# Patient Record
Sex: Female | Born: 1935 | Race: Black or African American | Hispanic: No | Smoking: Never smoker
Health system: Southern US, Community
[De-identification: ages and names within clinical notes are randomized; demographics above are authoritative.]

## PROBLEM LIST (undated history)

## (undated) DIAGNOSIS — E079 Disorder of thyroid, unspecified: Secondary | ICD-10-CM

## (undated) DIAGNOSIS — R001 Bradycardia, unspecified: Secondary | ICD-10-CM

## (undated) DIAGNOSIS — J189 Pneumonia, unspecified organism: Secondary | ICD-10-CM

## (undated) DIAGNOSIS — M1990 Unspecified osteoarthritis, unspecified site: Secondary | ICD-10-CM

## (undated) DIAGNOSIS — K859 Acute pancreatitis without necrosis or infection, unspecified: Secondary | ICD-10-CM

## (undated) DIAGNOSIS — N390 Urinary tract infection, site not specified: Secondary | ICD-10-CM

## (undated) DIAGNOSIS — D649 Anemia, unspecified: Secondary | ICD-10-CM

## (undated) DIAGNOSIS — K219 Gastro-esophageal reflux disease without esophagitis: Secondary | ICD-10-CM

## (undated) DIAGNOSIS — K31819 Angiodysplasia of stomach and duodenum without bleeding: Secondary | ICD-10-CM

## (undated) DIAGNOSIS — E876 Hypokalemia: Secondary | ICD-10-CM

## (undated) DIAGNOSIS — I10 Essential (primary) hypertension: Secondary | ICD-10-CM

## (undated) DIAGNOSIS — C801 Malignant (primary) neoplasm, unspecified: Secondary | ICD-10-CM

## (undated) HISTORY — DX: Urinary tract infection, site not specified: N39.0

## (undated) HISTORY — PX: BREAST SURGERY: SHX581

---

## 2008-10-21 ENCOUNTER — Encounter: Admission: RE | Admit: 2008-10-21 | Discharge: 2009-01-09 | Payer: Self-pay | Admitting: Unknown Physician Specialty

## 2012-10-28 ENCOUNTER — Emergency Department (HOSPITAL_COMMUNITY)
Admission: EM | Admit: 2012-10-28 | Discharge: 2012-10-28 | Disposition: A | Discharge status: Home / Self Care | Payer: Medicare Other | Attending: Emergency Medicine | Admitting: Emergency Medicine

## 2012-10-28 ENCOUNTER — Encounter (HOSPITAL_COMMUNITY): Payer: Self-pay | Admitting: *Deleted

## 2012-10-28 ENCOUNTER — Emergency Department (HOSPITAL_COMMUNITY): Payer: Medicare Other

## 2012-10-28 DIAGNOSIS — Z862 Personal history of diseases of the blood and blood-forming organs and certain disorders involving the immune mechanism: Secondary | ICD-10-CM | POA: Insufficient documentation

## 2012-10-28 DIAGNOSIS — H5789 Other specified disorders of eye and adnexa: Secondary | ICD-10-CM | POA: Insufficient documentation

## 2012-10-28 DIAGNOSIS — W1809XA Striking against other object with subsequent fall, initial encounter: Secondary | ICD-10-CM | POA: Insufficient documentation

## 2012-10-28 DIAGNOSIS — Y9289 Other specified places as the place of occurrence of the external cause: Secondary | ICD-10-CM | POA: Insufficient documentation

## 2012-10-28 DIAGNOSIS — S0990XA Unspecified injury of head, initial encounter: Secondary | ICD-10-CM | POA: Insufficient documentation

## 2012-10-28 DIAGNOSIS — Z853 Personal history of malignant neoplasm of breast: Secondary | ICD-10-CM | POA: Insufficient documentation

## 2012-10-28 DIAGNOSIS — Y9389 Activity, other specified: Secondary | ICD-10-CM | POA: Insufficient documentation

## 2012-10-28 DIAGNOSIS — Z8739 Personal history of other diseases of the musculoskeletal system and connective tissue: Secondary | ICD-10-CM | POA: Insufficient documentation

## 2012-10-28 DIAGNOSIS — W19XXXA Unspecified fall, initial encounter: Secondary | ICD-10-CM

## 2012-10-28 DIAGNOSIS — Z8639 Personal history of other endocrine, nutritional and metabolic disease: Secondary | ICD-10-CM | POA: Insufficient documentation

## 2012-10-28 HISTORY — DX: Malignant (primary) neoplasm, unspecified: C80.1

## 2012-10-28 HISTORY — DX: Disorder of thyroid, unspecified: E07.9

## 2012-10-28 HISTORY — DX: Unspecified osteoarthritis, unspecified site: M19.90

## 2012-10-28 NOTE — ED Notes (Signed)
Dr. Adriana Simas aware of elevated BP.  At bedside to discuss plan and discharge with pt and family.

## 2012-10-28 NOTE — ED Notes (Signed)
Pt reports falling on kitchen floor.  States that when she did her glasses struck her face on the right eye.  Swelling and bruising noted around eye.  Pt denies striking head or LOC.  Denies pain in any other location.

## 2012-10-28 NOTE — ED Notes (Signed)
Pt states she had a "hard" fall and hit the right side of her body. Pt has swelling to right eye but states she is in no pain. Denies loc.

## 2012-10-28 NOTE — ED Provider Notes (Signed)
History    This chart was scribed for Jo Hutching, MD by Leone Payor, ED Scribe. This patient was seen in room [facility]04/[facility]04 and the patient's care was started 9:24 PM.   CSN: [id number]  Arrival date & time 10/28/12  2114   First MD Initiated Contact with Patient 10/28/12 2123      Chief Complaint  Patient presents with  . Fall     The history is provided by the patient and a relative. No language interpreter was used.    Oddie Hanssen is a 79 y.o. female who presents to the Emergency Department complaining of a new fall tonight. Pt stays with daughter and daughter states this is her second fall recently. Pt states when she fell, she hit the right side of her body but she is not in any pain. Pt has swelling to right eye but denies LOC. Pt insists she is not sore anywhere on her body. Daughter states pt's behavior is unchanged post fall. Daughter states applying ice to the right eye.   Pt has h/o arthritis.  Pt denies smoking and alcohol use.  Past Medical History  Diagnosis Date  . Arthritis   . Thyroid disease   . Cancer     had breast cancer 8 yrs ago    Past Surgical History  Procedure Laterality Date  . Breast surgery      History reviewed. No pertinent family history.  History  Substance Use Topics  . Smoking status: Never Smoker   . Smokeless tobacco: Not on file  . Alcohol Use: No    No OB history provided.   Review of Systems A complete 10 system review of systems was obtained and all systems are negative except as noted in the HPI and PMH.   Allergies  Review of patient's allergies indicates not on file.  Home Medications  No current outpatient prescriptions on file.  BP 200/89  Pulse 89  Temp(Src) 98.3 F (36.8 C) (Oral)  Resp 20  Ht 5\' 2"  (1.575 m)  Wt 134 lb (60.782 kg)  BMI 24.5 kg/m2  SpO2 97%  Physical Exam  Nursing note and vitals reviewed. Constitutional: She is oriented to person, place, and time. She appears well-developed and  well-nourished.  HENT:  Head: Normocephalic and atraumatic.  Eyes: Conjunctivae and EOM are normal. Pupils are equal, round, and reactive to light.  Neck: Normal range of motion. Neck supple.  Cardiovascular: Normal rate, regular rhythm and normal heart sounds.   Pulmonary/Chest: Effort normal and breath sounds normal.  Abdominal: Soft. Bowel sounds are normal.  Musculoskeletal: Normal range of motion.  Neurological: She is alert and oriented to person, place, and time.  Skin: Skin is warm and dry.  2.5 cm hematoma to right upper eyelid.   Psychiatric: She has a normal mood and affect.    ED Course  Procedures (including critical care time)  DIAGNOSTIC STUDIES: Oxygen Saturation is 97% on room air, adequate by my interpretation.    COORDINATION OF CARE: 9:49 PM Discussed treatment plan which includes CT scan of head with pt at bedside and pt agreed to plan.    Labs Reviewed - No data to display No results found.  Ct Head Wo Contrast  10/28/2012  *RADIOLOGY REPORT*  Clinical Data: 79 year old female status post fall with right eye swelling.  Right side injury.  CT HEAD WITHOUT CONTRAST  Technique:  Contiguous axial images were obtained from the base of the skull through the vertex without contrast.  Comparison: None.  Findings: Right periorbital hematoma inseparable from the right lacrimal gland and measuring up to 10 mm in thickness.  Right globe appears intact.  Retrobulbar soft tissues appear symmetric and within normal limits.  No visible right orbital fracture. Osteopenia.  Scalp soft tissues elsewhere within normal limits.  Mucous retention cyst right sphenoid sinus.  Trace layering low density fluid in the right sphenoid sinus.  Other Visualized paranasal sinuses and mastoids are clear.  Congenital nonfusion posterior C1 ring. No acute osseous abnormality identified.  Chronic lacunar infarcts in the cerebellum.  Cerebral volume at the lower limits of normal for age.  No  ventriculomegaly. No midline shift, mass effect, or evidence of mass lesion.  Mild for age periventricular white matter hypodensity. No evidence of cortically based acute infarction identified.  No acute intracranial hemorrhage identified.  No suspicious intracranial vascular hyperdensity.  IMPRESSION: 1.  Right periorbital soft tissue injury without underlying fracture identified. 2.  Chronic small vessel disease.  No acute traumatic injury to the brain identified. 3.  Trace low density fluid level in the right sphenoid sinus, favor inflammatory.   Original Report Authenticated By: Erskine Speed, M.D.    No diagnosis found.    MDM  CT head shows no subdural hematoma or fractures. Family says normal behavior     I personally performed the services described in this documentation, which was scribed in my presence. The recorded information has been reviewed and is accurate.   Jo Hutching, MD 10/28/12 2248

## 2012-10-28 NOTE — ED Notes (Signed)
Patient transported to CT 

## 2015-05-03 ENCOUNTER — Emergency Department (HOSPITAL_COMMUNITY): Payer: Medicare Other

## 2015-05-03 ENCOUNTER — Encounter (HOSPITAL_COMMUNITY): Payer: Self-pay

## 2015-05-03 ENCOUNTER — Inpatient Hospital Stay (HOSPITAL_COMMUNITY)
Admission: EM | Admit: 2015-05-03 | Discharge: 2015-05-09 | DRG: 440 | Disposition: A | Discharge status: Skilled Nursing Facility | Payer: Medicare Other | Attending: Internal Medicine | Admitting: Internal Medicine

## 2015-05-03 DIAGNOSIS — E039 Hypothyroidism, unspecified: Secondary | ICD-10-CM | POA: Diagnosis present

## 2015-05-03 DIAGNOSIS — E876 Hypokalemia: Secondary | ICD-10-CM | POA: Diagnosis not present

## 2015-05-03 DIAGNOSIS — K219 Gastro-esophageal reflux disease without esophagitis: Secondary | ICD-10-CM | POA: Diagnosis present

## 2015-05-03 DIAGNOSIS — M1990 Unspecified osteoarthritis, unspecified site: Secondary | ICD-10-CM | POA: Diagnosis present

## 2015-05-03 DIAGNOSIS — R7989 Other specified abnormal findings of blood chemistry: Secondary | ICD-10-CM | POA: Diagnosis present

## 2015-05-03 DIAGNOSIS — Z791 Long term (current) use of non-steroidal anti-inflammatories (NSAID): Secondary | ICD-10-CM

## 2015-05-03 DIAGNOSIS — Z853 Personal history of malignant neoplasm of breast: Secondary | ICD-10-CM | POA: Diagnosis not present

## 2015-05-03 DIAGNOSIS — Z6824 Body mass index (BMI) 24.0-24.9, adult: Secondary | ICD-10-CM

## 2015-05-03 DIAGNOSIS — R112 Nausea with vomiting, unspecified: Secondary | ICD-10-CM | POA: Diagnosis not present

## 2015-05-03 DIAGNOSIS — K31819 Angiodysplasia of stomach and duodenum without bleeding: Secondary | ICD-10-CM

## 2015-05-03 DIAGNOSIS — Z79899 Other long term (current) drug therapy: Secondary | ICD-10-CM | POA: Diagnosis not present

## 2015-05-03 DIAGNOSIS — D649 Anemia, unspecified: Secondary | ICD-10-CM | POA: Diagnosis not present

## 2015-05-03 DIAGNOSIS — D500 Iron deficiency anemia secondary to blood loss (chronic): Secondary | ICD-10-CM | POA: Diagnosis not present

## 2015-05-03 DIAGNOSIS — E785 Hyperlipidemia, unspecified: Secondary | ICD-10-CM | POA: Diagnosis present

## 2015-05-03 DIAGNOSIS — M479 Spondylosis, unspecified: Secondary | ICD-10-CM | POA: Diagnosis present

## 2015-05-03 DIAGNOSIS — K850 Idiopathic acute pancreatitis: Secondary | ICD-10-CM | POA: Diagnosis not present

## 2015-05-03 DIAGNOSIS — K859 Acute pancreatitis without necrosis or infection, unspecified: Secondary | ICD-10-CM | POA: Diagnosis present

## 2015-05-03 DIAGNOSIS — D509 Iron deficiency anemia, unspecified: Secondary | ICD-10-CM | POA: Diagnosis present

## 2015-05-03 DIAGNOSIS — I10 Essential (primary) hypertension: Secondary | ICD-10-CM | POA: Diagnosis present

## 2015-05-03 HISTORY — DX: Acute pancreatitis without necrosis or infection, unspecified: K85.90

## 2015-05-03 HISTORY — DX: Angiodysplasia of stomach and duodenum without bleeding: K31.819

## 2015-05-03 LAB — COMPREHENSIVE METABOLIC PANEL
ALK PHOS: 95 U/L (ref 38–126)
ALT: 67 U/L — AB (ref 14–54)
AST: 112 U/L — ABNORMAL HIGH (ref 15–41)
Albumin: 3.7 g/dL (ref 3.5–5.0)
Anion gap: 11 (ref 5–15)
BUN: 19 mg/dL (ref 6–20)
CALCIUM: 8.8 mg/dL — AB (ref 8.9–10.3)
CHLORIDE: 104 mmol/L (ref 101–111)
CO2: 23 mmol/L (ref 22–32)
CREATININE: 1.19 mg/dL — AB (ref 0.44–1.00)
GFR, EST AFRICAN AMERICAN: 45 mL/min — AB (ref >60)
GFR, EST NON AFRICAN AMERICAN: 39 mL/min — AB (ref >60)
Glucose, Bld: 105 mg/dL — ABNORMAL HIGH (ref 65–99)
Potassium: 4 mmol/L (ref 3.5–5.1)
Sodium: 138 mmol/L (ref 135–145)
Total Bilirubin: 1 mg/dL (ref 0.3–1.2)
Total Protein: 7.1 g/dL (ref 6.5–8.1)

## 2015-05-03 LAB — URINALYSIS, ROUTINE W REFLEX MICROSCOPIC
BILIRUBIN URINE: NEGATIVE
Glucose, UA: NEGATIVE mg/dL
KETONES UR: NEGATIVE mg/dL
Leukocytes, UA: NEGATIVE
NITRITE: NEGATIVE
Protein, ur: NEGATIVE mg/dL
Specific Gravity, Urine: 1.01 (ref 1.005–1.030)
UROBILINOGEN UA: 0.2 mg/dL (ref 0.0–1.0)
pH: 5.5 (ref 5.0–8.0)

## 2015-05-03 LAB — CBC WITH DIFFERENTIAL/PLATELET
BASOS ABS: 0 10*3/uL (ref 0.0–0.1)
Basophils Relative: 0 % (ref 0–1)
Eosinophils Absolute: 0 10*3/uL (ref 0.0–0.7)
Eosinophils Relative: 0 % (ref 0–5)
HCT: 35.7 % — ABNORMAL LOW (ref 36.0–46.0)
Hemoglobin: 11.7 g/dL — ABNORMAL LOW (ref 12.0–15.0)
LYMPHS ABS: 0.5 10*3/uL — AB (ref 0.7–4.0)
Lymphocytes Relative: 4 % — ABNORMAL LOW (ref 12–46)
MCH: 28.4 pg (ref 26.0–34.0)
MCHC: 32.8 g/dL (ref 30.0–36.0)
MCV: 86.7 fL (ref 78.0–100.0)
MONO ABS: 0.6 10*3/uL (ref 0.1–1.0)
Monocytes Relative: 5 % (ref 3–12)
Neutro Abs: 11 10*3/uL — ABNORMAL HIGH (ref 1.7–7.7)
Neutrophils Relative %: 91 % — ABNORMAL HIGH (ref 43–77)
PLATELETS: 223 10*3/uL (ref 150–400)
RBC: 4.12 MIL/uL (ref 3.87–5.11)
RDW: 14.5 % (ref 11.5–15.5)
WBC Morphology: INCREASED
WBC: 12.1 10*3/uL — AB (ref 4.0–10.5)

## 2015-05-03 LAB — LIPASE, BLOOD
LIPASE: 1757 U/L — AB (ref 22–51)
Lipase: 1645 U/L — ABNORMAL HIGH (ref 22–51)

## 2015-05-03 LAB — URINE MICROSCOPIC-ADD ON

## 2015-05-03 MED ORDER — ACETAMINOPHEN 325 MG PO TABS
650.0000 mg | ORAL_TABLET | Freq: Four times a day (QID) | ORAL | Status: DC | PRN
  Administered 2015-05-04: 650 mg via ORAL
  Filled 2015-05-03: qty 2

## 2015-05-03 MED ORDER — LEVOTHYROXINE SODIUM 25 MCG PO TABS
125.0000 ug | ORAL_TABLET | Freq: Every day | ORAL | Status: DC
  Administered 2015-05-04 – 2015-05-09 (×6): 125 ug via ORAL
  Filled 2015-05-03 (×12): qty 1

## 2015-05-03 MED ORDER — INFLUENZA VAC SPLIT QUAD 0.5 ML IM SUSY
0.5000 mL | PREFILLED_SYRINGE | INTRAMUSCULAR | Status: AC
  Administered 2015-05-04: 0.5 mL via INTRAMUSCULAR
  Filled 2015-05-03: qty 0.5

## 2015-05-03 MED ORDER — TRAMADOL HCL 50 MG PO TABS
50.0000 mg | ORAL_TABLET | Freq: Two times a day (BID) | ORAL | Status: DC
  Administered 2015-05-03 – 2015-05-09 (×12): 50 mg via ORAL
  Filled 2015-05-03 (×12): qty 1

## 2015-05-03 MED ORDER — LOSARTAN POTASSIUM 50 MG PO TABS
100.0000 mg | ORAL_TABLET | Freq: Every day | ORAL | Status: DC
  Administered 2015-05-04 – 2015-05-09 (×6): 100 mg via ORAL
  Filled 2015-05-03 (×6): qty 2

## 2015-05-03 MED ORDER — ONDANSETRON HCL 4 MG PO TABS
4.0000 mg | ORAL_TABLET | Freq: Four times a day (QID) | ORAL | Status: DC | PRN
  Administered 2015-05-06: 4 mg via ORAL
  Filled 2015-05-03: qty 1

## 2015-05-03 MED ORDER — ALUM & MAG HYDROXIDE-SIMETH 200-200-20 MG/5ML PO SUSP: 30.0000 mL | Freq: Four times a day (QID) | ORAL | Status: DC | PRN

## 2015-05-03 MED ORDER — IOHEXOL 300 MG/ML  SOLN
25.0000 mL | Freq: Once | INTRAMUSCULAR | Status: AC | PRN
  Administered 2015-05-03: 25 mL via ORAL

## 2015-05-03 MED ORDER — SODIUM CHLORIDE 0.9 % IV SOLN
INTRAVENOUS | Status: DC
Start: 2015-05-03 — End: 2015-05-08
  Administered 2015-05-03 – 2015-05-08 (×12): via INTRAVENOUS

## 2015-05-03 MED ORDER — SODIUM CHLORIDE 0.9 % IV BOLUS (SEPSIS)
500.0000 mL | Freq: Once | INTRAVENOUS | Status: AC
  Administered 2015-05-03: 500 mL via INTRAVENOUS

## 2015-05-03 MED ORDER — ONDANSETRON HCL 4 MG/2ML IJ SOLN: 4.0000 mg | Freq: Four times a day (QID) | INTRAMUSCULAR | Status: DC | PRN

## 2015-05-03 MED ORDER — ONDANSETRON HCL 4 MG/2ML IJ SOLN
4.0000 mg | Freq: Once | INTRAMUSCULAR | Status: AC
  Administered 2015-05-03: 4 mg via INTRAVENOUS
  Filled 2015-05-03: qty 2

## 2015-05-03 MED ORDER — FLUTICASONE PROPIONATE 50 MCG/ACT NA SUSP
1.0000 | Freq: Two times a day (BID) | NASAL | Status: DC
  Administered 2015-05-03 – 2015-05-09 (×12): 1 via NASAL
  Filled 2015-05-03 (×2): qty 16

## 2015-05-03 MED ORDER — SIMVASTATIN 10 MG PO TABS
10.0000 mg | ORAL_TABLET | Freq: Every day | ORAL | Status: DC
  Administered 2015-05-03 – 2015-05-08 (×6): 10 mg via ORAL
  Filled 2015-05-03 (×6): qty 1

## 2015-05-03 MED ORDER — DOCUSATE SODIUM 100 MG PO CAPS
100.0000 mg | ORAL_CAPSULE | Freq: Every morning | ORAL | Status: DC
  Administered 2015-05-04 – 2015-05-09 (×6): 100 mg via ORAL
  Filled 2015-05-03 (×6): qty 1

## 2015-05-03 MED ORDER — ACETAMINOPHEN 650 MG RE SUPP: 650.0000 mg | Freq: Four times a day (QID) | RECTAL | Status: DC | PRN

## 2015-05-03 MED ORDER — ENOXAPARIN SODIUM 30 MG/0.3ML ~~LOC~~ SOLN
30.0000 mg | SUBCUTANEOUS | Status: DC
Start: 2015-05-03 — End: 2015-05-04
  Administered 2015-05-03: 30 mg via SUBCUTANEOUS
  Filled 2015-05-03: qty 0.3

## 2015-05-03 MED ORDER — IOHEXOL 300 MG/ML  SOLN
80.0000 mL | Freq: Once | INTRAMUSCULAR | Status: AC | PRN
Start: 2015-05-03 — End: 2015-05-03
  Administered 2015-05-03: 80 mL via INTRAVENOUS

## 2015-05-03 MED ORDER — SENNOSIDES-DOCUSATE SODIUM 8.6-50 MG PO TABS: 1.0000 | ORAL_TABLET | Freq: Every evening | ORAL | Status: DC | PRN

## 2015-05-03 MED ORDER — PANTOPRAZOLE SODIUM 40 MG PO TBEC
40.0000 mg | DELAYED_RELEASE_TABLET | Freq: Every day | ORAL | Status: DC
  Administered 2015-05-04 – 2015-05-09 (×6): 40 mg via ORAL
  Filled 2015-05-03 (×6): qty 1

## 2015-05-03 NOTE — ED Notes (Signed)
zofran given, instructed pt to start drinking contrast around 3 if she can tolerate it.

## 2015-05-03 NOTE — H&P (Addendum)
History and Physical  Jo Hardin WLN:[id number] DOB: 06-Mar-1922 DOA: 05/03/2015  Referring physician: Dr. Alvino Chapel, ED physician PCP: No primary care provider on file.   Chief Complaint: Abdominal pain, nausea, vomiting  HPI: Jo Hardin is a 81 y.o. female  With a history of arthritis, thyroid disease, breast cancer status post lumpectomy, essential hypertension, hyperlipidemia. Patient presents with 48 hours of abdominal pain primarily in the epigastric region. Her pain has been worsening over the course of the 48 hours. Pain is worse with food and liquid. Slightly improved with vomiting. She has been vomiting several times over the 48 hours which is described as somewhat contents. Vomiting worse with oral food and liquid.   Review of Systems:   Pt complains of an episode of diarrhea.  Pt denies any fevers, chills, constipation, shortness of breath, dyspnea on exertion, orthopnea, cough, wheezing, palpitations, headache, vision changes, lightheadedness, dizziness, diarrhea, constipation, melena, rectal bleeding.  Review of systems are otherwise negative  Past Medical History  Diagnosis Date  . Arthritis   . Thyroid disease   . Cancer     had breast cancer 8 yrs ago   Past Surgical History  Procedure Laterality Date  . Breast surgery     Social History:  reports that she has never smoked. She does not have any smokeless tobacco history on file. She reports that she does not drink alcohol or use illicit drugs. Patient lives at home with her daughter & is able to participate in activities of daily living  Allergies  Allergen Reactions  . Codeine Nausea And Vomiting    Family history of hypertension  Prior to Admission medications   Medication Sig Start Date End Date Taking? Authorizing Provider  docusate sodium (COLACE) 100 MG capsule Take 100 mg by mouth every morning.   Yes Historical Provider, MD  fluticasone (FLONASE) 50 MCG/ACT nasal spray Place 1 spray into the nose 2  (two) times daily.   Yes Historical Provider, MD  levothyroxine (SYNTHROID, LEVOTHROID) 125 MCG tablet Take 125 mcg by mouth daily.   Yes Historical Provider, MD  losartan (COZAAR) 100 MG tablet Take 100 mg by mouth daily.   Yes Historical Provider, MD  meloxicam (MOBIC) 7.5 MG tablet Take 7.5 mg by mouth daily.   Yes Historical Provider, MD  Olopatadine HCl (PATADAY) 0.2 % SOLN Place 1 drop into both eyes daily.   Yes Historical Provider, MD  omeprazole (PRILOSEC) 20 MG capsule Take 20 mg by mouth daily.   Yes Historical Provider, MD  potassium chloride (K-DUR) 10 MEQ tablet Take 10 mEq by mouth 2 (two) times daily.   Yes Historical Provider, MD  simvastatin (ZOCOR) 10 MG tablet Take 10 mg by mouth at bedtime.   Yes Historical Provider, MD  traMADol (ULTRAM) 50 MG tablet Take 50 mg by mouth 2 (two) times daily.   Yes Historical Provider, MD  Vitamin D, Ergocalciferol, (DRISDOL) 50000 UNITS CAPS Take 50,000 Units by mouth every Tuesday.    Yes Historical Provider, MD    Physical Exam: BP 126/77 mmHg  Pulse 94  Temp(Src) 98.8 F (37.1 C) (Oral)  Resp 16  Ht 5\' 2"  (1.575 m)  Wt 59.875 kg (132 lb)  BMI 24.14 kg/m2  SpO2 91%  General: Elderly black female. Awake and alert and oriented x3. No acute cardiopulmonary distress.  Eyes: Pupils equal, round, reactive to light. Extraocular muscles are intact. Sclerae anicteric and noninjected.  ENT:  Moist mucosal membranes. No mucosal lesions.   Neck: Neck supple without  lymphadenopathy. No carotid bruits. No masses palpated.  Cardiovascular: Regular rate with normal S1-S2 sounds. No murmurs, rubs, gallops auscultated. No JVD.  Respiratory: Good respiratory effort with no wheezes, rales, rhonchi. Lungs clear to auscultation bilaterally.  Abdomen:  Tenderness in the epigastric area with guarding. No rebound tenderness. Active bowel sounds. No masses or hepatosplenomegaly  Skin: Dry, warm to touch. 2+ dorsalis pedis and radial  pulses. Musculoskeletal: No calf or leg pain. All major joints not erythematous nontender.  Psychiatric: Intact judgment and insight.  Neurologic: No focal neurological deficits. Cranial nerves II through XII are grossly intact.           Labs on Admission:  Basic Metabolic Panel:  Recent Labs Lab 05/03/15 1440  NA 138  K 4.0  CL 104  CO2 23  GLUCOSE 105*  BUN 19  CREATININE 1.19*  CALCIUM 8.8*   Liver Function Tests:  Recent Labs Lab 05/03/15 1440  AST 112*  ALT 67*  ALKPHOS 95  BILITOT 1.0  PROT 7.1  ALBUMIN 3.7    Recent Labs Lab 05/03/15 1440  LIPASE 1757*   No results for input(s): AMMONIA in the last 168 hours. CBC:  Recent Labs Lab 05/03/15 1440  WBC 12.1*  NEUTROABS 11.0*  HGB 11.7*  HCT 35.7*  MCV 86.7  PLT 223   Cardiac Enzymes: No results for input(s): CKTOTAL, CKMB, CKMBINDEX, TROPONINI in the last 168 hours.  BNP (last 3 results) No results for input(s): BNP in the last 8760 hours.  ProBNP (last 3 results) No results for input(s): PROBNP in the last 8760 hours.  CBG: No results for input(s): GLUCAP in the last 168 hours.  Radiological Exams on Admission: Ct Abdomen Pelvis W Contrast  05/03/2015   CLINICAL DATA:  Abdominal pain and vomiting. Diarrhea. History breast cancer 8 years ago.  EXAM: CT ABDOMEN AND PELVIS WITH CONTRAST  TECHNIQUE: Multidetector CT imaging of the abdomen and pelvis was performed using the standard protocol following bolus administration of intravenous contrast.  CONTRAST:  12mL OMNIPAQUE IOHEXOL 300 MG/ML SOLN, 30mL OMNIPAQUE IOHEXOL 300 MG/ML SOLN  COMPARISON:  None.  FINDINGS: Lower chest: Cardiac enlargement. Pulmonary artery enlargement. Mild atelectasis in the lung bases. No effusion or pneumonia.  Hepatobiliary: Linear hypodensity in the right lobe liver may represent Mild biliary dilatation. Central bile ducts mildly prominent however common bile duct is not dilated measuring 6 mm. Gallbladder wall shows  hyper enhancement without significant thickening. There is fluid around the gallbladder which may be related to ascites. Small amount of ascites is also noted in the infrahepatic region. No liver mass lesion.  Pancreas: Mild peripancreatic edema raises the possibility of pancreatitis. No pancreatic mass or calcification. There is mild edema extending into the mesentery.  Spleen: Negative  Adrenals/Urinary Tract: Normal kidneys. No renal mass or obstruction. No renal calculi. Urinary bladder normal.  Stomach/Bowel: Negative for bowel obstruction. No significant bowel edema. Normal appendix.  Vascular/Lymphatic: Atherosclerotic aorta with tortuosity but no aneurysm.  Reproductive: Uterus not enlarged. Small uterine calcifications compatible with chronic fibroid.  Other: No free fluid in the pelvis. Mild amount of ascites around the liver and gallbladder. No adenopathy.  Musculoskeletal: Extensive disc degeneration and spondylosis diffusely. Negative for acute fracture. Schmorl's node inferior endplate of W09  IMPRESSION: Mild ascites in the upper abdomen around the gallbladder and liver.  Mild peripancreatic edema extending into the mesenteric, possibly due to pancreatitis.  Central bile ducts mildly prominent however common bile duct is normal at 6 mm. Linear branching  hypodensity in the right lobe liver may represent a mildly dilated bile duct.   Electronically Signed   By: Franchot Gallo M.D.   On: 05/03/2015 16:26     Assessment/Plan Present on Admission:  . Pancreatitis, acute  This patient was discussed with the ED physician, including pertinent vitals, physical exam findings, labs, and imaging.  We also discussed care given by the ED provider.  #1 acute pancreatitis  Admit to MedSurg  Uncertain as to the etiology of the patient's pancreatitis - no change in medications, no increased doses, no new medications. Patient denies alcohol use  No evidence of choledocholithiasis on CT, although has  slightly prominent CBD.  It is possible that she already passed a stone.   No evidence of pancreatic necrosis  IV fluids at 150 mL per hour  Recheck lipase in the morning  Recheck CMP, CBC #2 hypertension  Continue home medications #3 GERD  Continue Protonix  DVT prophylaxis: Lovenox  Consultants: None  Code Status: Full code  Family Communication: Daughter in the room   Disposition Plan: [place], DO [facility] Pager [phone]

## 2015-05-03 NOTE — ED Notes (Signed)
CT made aware pt is done drinking.

## 2015-05-03 NOTE — ED Provider Notes (Signed)
CSN: [id number]     Arrival date & time 05/03/15  1400 History   First MD Initiated Contact with Patient 05/03/15 1414     Chief Complaint  Patient presents with  . Abdominal Pain  . Emesis     (Consider location/radiation/quality/duration/timing/severity/associated sxs/prior Treatment) Patient is a 81 y.o. female presenting with abdominal pain and vomiting. The history is provided by the patient and a relative.  Abdominal Pain Associated symptoms: fatigue, nausea and vomiting   Associated symptoms: no chest pain, no chills, no constipation, no diarrhea, no fever and no shortness of breath   Emesis Associated symptoms: abdominal pain   Associated symptoms: no chills and no diarrhea    patient presents with nausea and vomiting the last couple days. Also moderate abdominal pain. She has decreased appetite really didn't eat breakfast this morning. No fevers. She's been throwing up stomach contents. No dysuria. No previous abdominal surgeries. The pain is dull and constant. It is worse after eating.  Past Medical History  Diagnosis Date  . Arthritis   . Thyroid disease   . Cancer     had breast cancer 8 yrs ago   Past Surgical History  Procedure Laterality Date  . Breast surgery     History reviewed. No pertinent family history. Social History  Substance Use Topics  . Smoking status: Never Smoker   . Smokeless tobacco: None  . Alcohol Use: No   OB History    No data available     Review of Systems  Constitutional: Positive for appetite change and fatigue. Negative for fever and chills.  Respiratory: Negative for shortness of breath.   Cardiovascular: Negative for chest pain.  Gastrointestinal: Positive for nausea, vomiting and abdominal pain. Negative for diarrhea and constipation.  Genitourinary: Negative for flank pain.  Musculoskeletal: Negative for back pain.  Skin: Negative for wound.  Neurological: Negative for seizures.      Allergies  Codeine  Home  Medications   Prior to Admission medications   Medication Sig Start Date End Date Taking? Authorizing Provider  docusate sodium (COLACE) 100 MG capsule Take 100 mg by mouth every morning.   Yes Historical Provider, MD  fluticasone (FLONASE) 50 MCG/ACT nasal spray Place 1 spray into the nose 2 (two) times daily.   Yes Historical Provider, MD  levothyroxine (SYNTHROID, LEVOTHROID) 125 MCG tablet Take 125 mcg by mouth daily.   Yes Historical Provider, MD  losartan (COZAAR) 100 MG tablet Take 100 mg by mouth daily.   Yes Historical Provider, MD  meloxicam (MOBIC) 7.5 MG tablet Take 7.5 mg by mouth daily.   Yes Historical Provider, MD  Olopatadine HCl (PATADAY) 0.2 % SOLN Place 1 drop into both eyes daily.   Yes Historical Provider, MD  omeprazole (PRILOSEC) 20 MG capsule Take 20 mg by mouth daily.   Yes Historical Provider, MD  potassium chloride (K-DUR) 10 MEQ tablet Take 10 mEq by mouth 2 (two) times daily.   Yes Historical Provider, MD  simvastatin (ZOCOR) 10 MG tablet Take 10 mg by mouth at bedtime.   Yes Historical Provider, MD  traMADol (ULTRAM) 50 MG tablet Take 50 mg by mouth 2 (two) times daily.   Yes Historical Provider, MD  Vitamin D, Ergocalciferol, (DRISDOL) 50000 UNITS CAPS Take 50,000 Units by mouth every Tuesday.    Yes Historical Provider, MD   BP 174/74 mmHg  Pulse 77  Temp(Src) 98.7 F (37.1 C) (Oral)  Resp 18  Ht 4\' 11"  (1.499 m)  Wt 128 lb  8.5 oz (58.3 kg)  BMI 25.95 kg/m2  SpO2 93% Physical Exam  Constitutional: She appears well-developed.  HENT:  Head: Atraumatic.  Neck: Neck supple.  Cardiovascular: Normal rate and regular rhythm.   Pulmonary/Chest: Effort normal.  Abdominal: She exhibits distension. She exhibits no mass. There is no rebound and no guarding.  Mild distention. Some tenderness to left abdomen more than right. No hernias palpated.  Musculoskeletal: Normal range of motion.  Neurological: She is alert.  Skin: Skin is warm.    ED Course   Procedures (including critical care time) Labs Review Labs Reviewed  COMPREHENSIVE METABOLIC PANEL - Abnormal; Notable for the following:    Glucose, Bld 105 (*)    Creatinine, Ser 1.19 (*)    Calcium 8.8 (*)    AST 112 (*)    ALT 67 (*)    GFR calc non Af Amer 39 (*)    GFR calc Af Amer 45 (*)    All other components within normal limits  LIPASE, BLOOD - Abnormal; Notable for the following:    Lipase 1757 (*)    All other components within normal limits  CBC WITH DIFFERENTIAL/PLATELET - Abnormal; Notable for the following:    WBC 12.1 (*)    Hemoglobin 11.7 (*)    HCT 35.7 (*)    Neutrophils Relative % 91 (*)    Lymphocytes Relative 4 (*)    Neutro Abs 11.0 (*)    Lymphs Abs 0.5 (*)    All other components within normal limits  URINALYSIS, ROUTINE W REFLEX MICROSCOPIC (NOT AT [facility]) - Abnormal; Notable for the following:    Hgb urine dipstick TRACE (*)    All other components within normal limits  BASIC METABOLIC PANEL - Abnormal; Notable for the following:    Creatinine, Ser 1.02 (*)    Calcium 7.6 (*)    GFR calc non Af Amer 47 (*)    GFR calc Af Amer 54 (*)    All other components within normal limits  CBC - Abnormal; Notable for the following:    WBC 11.7 (*)    RBC 3.34 (*)    Hemoglobin 9.6 (*)    HCT 28.9 (*)    All other components within normal limits  LIPASE, BLOOD - Abnormal; Notable for the following:    Lipase 1645 (*)    All other components within normal limits  CBC - Abnormal; Notable for the following:    WBC 13.6 (*)    RBC 3.55 (*)    Hemoglobin 10.1 (*)    HCT 30.9 (*)    All other components within normal limits  CBC - Abnormal; Notable for the following:    WBC 12.0 (*)    RBC 3.13 (*)    Hemoglobin 9.0 (*)    HCT 26.9 (*)    Platelets 147 (*)    All other components within normal limits  IRON AND TIBC - Abnormal; Notable for the following:    Iron 6 (*)    Saturation Ratios 2 (*)    All other components within normal limits   RETICULOCYTES - Abnormal; Notable for the following:    RBC. 3.53 (*)    All other components within normal limits  COMPREHENSIVE METABOLIC PANEL - Abnormal; Notable for the following:    BUN 22 (*)    Calcium 7.6 (*)    Total Protein 5.4 (*)    Albumin 2.7 (*)    GFR calc non Af Amer 57 (*)  All other components within normal limits  LIPASE, BLOOD - Abnormal; Notable for the following:    Lipase 649 (*)    All other components within normal limits  CBC - Abnormal; Notable for the following:    WBC 14.4 (*)    RBC 3.22 (*)    Hemoglobin 9.4 (*)    HCT 27.8 (*)    Platelets 138 (*)    All other components within normal limits  CBC - Abnormal; Notable for the following:    WBC 11.4 (*)    RBC 3.36 (*)    Hemoglobin 9.8 (*)    HCT 29.1 (*)    Platelets 132 (*)    All other components within normal limits  URINE MICROSCOPIC-ADD ON  VITAMIN B12  FOLATE  FERRITIN  PROTIME-INR  OCCULT BLOOD X 1 CARD TO LAB, STOOL  OCCULT BLOOD X 1 CARD TO LAB, STOOL  TYPE AND SCREEN    Imaging Review Mr 3d Recon At Scanner  05/06/2015   CLINICAL DATA:  81 year old female with upper abdominal pain and nausea and vomiting for the past 3 days. History of breast cancer.  EXAM: MRI ABDOMEN WITHOUT AND WITH CONTRAST (INCLUDING MRCP)  TECHNIQUE: Multiplanar multisequence MR imaging of the abdomen was performed both before and after the administration of intravenous contrast. Heavily T2-weighted images of the biliary and pancreatic ducts were obtained, and three-dimensional MRCP images were rendered by post processing.  CONTRAST:  36mL MULTIHANCE GADOBENATE DIMEGLUMINE 529 MG/ML IV SOLN  COMPARISON:  CT the abdomen and pelvis 05/03/2015.  FINDINGS: Comment: Study is limited by considerable patient respiratory motion.  Lower chest: Dependent increased signal intensity in the lower lobes of the lungs bilaterally is nonspecific, but likely to reflect areas of atelectasis. Cardiomegaly.  Hepatobiliary: MRCP  images are severely limited by patient respiratory motion. With these limitations in mind, there does appear to be a focal narrowing of the proximal common hepatic duct, best appreciated on image 46 of series 13, and image 1 of series 101. On axial images, this region is poorly depicted, and simply appears decompressed, without overt intrinsic or extrinsic mass causing obstruction. There is very minimal intrahepatic biliary ductal dilatation. In segment 5 of the liver there is a more focal T2 signal intensity which predominantly appears very ductal in the central aspect of the liver, suggesting some focal periportal edema. In the more peripheral aspect of segment 5 there appears to be some mild peripheral intrahepatic biliary ductal dilatation, with a small amount of surrounding enhancement, best appreciated on image 34 of series 5006. No discrete hepatic mass is confidently identified at this time. Gallbladder is moderately distended. No definite gallstones. No gallbladder wall thickening.  Pancreas: No pancreatic mass. No pancreatic ductal dilatation. No pancreatic or peripancreatic fluid or inflammatory changes.  Spleen: Unremarkable.  Adrenals/Urinary Tract: Bilateral kidneys and bilateral adrenal glands are normal in appearance.  Stomach/Bowel: Visualized portions are unremarkable.  Vascular/Lymphatic: No aneurysm identified in the visualized abdominal vasculature. No lymphadenopathy noted in the abdomen.  Other: Trace volume of ascites.  Musculoskeletal: No aggressive osseous lesion noted in the visualized portions of the skeleton.  IMPRESSION: 1. Unusual appearance of the liver where there appears to be some mild peripheral intrahepatic biliary ductal dilatation, most evident in segment 5, where there also appears to be some focal periportal edema/inflammation. This is poorly evaluated on today's study secondary to extensive patient respiratory motion. The possibility of a focal area of cholangitis should be  considered. While a centrally obstructing lesion  is not excluded, particularly in light of the narrowing of the proximal common hepatic duct, a discrete lesion is not confidently identified on today's study. MRI imaging in this elderly individual who appears unable to adequately hold her breath for the examination is very sub optimal, and any future follow-up should be performed as a contrast-enhanced CT scan (which is less prone to motion related artifacts). At this time, clinical correlation is recommended. Should further imaging followup be clinically appropriate, repeat CT of the abdomen with and without IV gadolinium in 3-6 months could be performed to exclude the possibility of a aggressive process such as a cholangiocarcinoma, which is simply not detected on today's examination. 2. Trace volume of ascites.   Electronically Signed   By: Vinnie Langton M.D.   On: 05/06/2015 09:33   Mr Jeananne Rama W/wo Cm/mrcp  05/06/2015   CLINICAL DATA:  81 year old female with upper abdominal pain and nausea and vomiting for the past 3 days. History of breast cancer.  EXAM: MRI ABDOMEN WITHOUT AND WITH CONTRAST (INCLUDING MRCP)  TECHNIQUE: Multiplanar multisequence MR imaging of the abdomen was performed both before and after the administration of intravenous contrast. Heavily T2-weighted images of the biliary and pancreatic ducts were obtained, and three-dimensional MRCP images were rendered by post processing.  CONTRAST:  79mL MULTIHANCE GADOBENATE DIMEGLUMINE 529 MG/ML IV SOLN  COMPARISON:  CT the abdomen and pelvis 05/03/2015.  FINDINGS: Comment: Study is limited by considerable patient respiratory motion.  Lower chest: Dependent increased signal intensity in the lower lobes of the lungs bilaterally is nonspecific, but likely to reflect areas of atelectasis. Cardiomegaly.  Hepatobiliary: MRCP images are severely limited by patient respiratory motion. With these limitations in mind, there does appear to be a focal narrowing of  the proximal common hepatic duct, best appreciated on image 46 of series 13, and image 1 of series 101. On axial images, this region is poorly depicted, and simply appears decompressed, without overt intrinsic or extrinsic mass causing obstruction. There is very minimal intrahepatic biliary ductal dilatation. In segment 5 of the liver there is a more focal T2 signal intensity which predominantly appears very ductal in the central aspect of the liver, suggesting some focal periportal edema. In the more peripheral aspect of segment 5 there appears to be some mild peripheral intrahepatic biliary ductal dilatation, with a small amount of surrounding enhancement, best appreciated on image 34 of series 5006. No discrete hepatic mass is confidently identified at this time. Gallbladder is moderately distended. No definite gallstones. No gallbladder wall thickening.  Pancreas: No pancreatic mass. No pancreatic ductal dilatation. No pancreatic or peripancreatic fluid or inflammatory changes.  Spleen: Unremarkable.  Adrenals/Urinary Tract: Bilateral kidneys and bilateral adrenal glands are normal in appearance.  Stomach/Bowel: Visualized portions are unremarkable.  Vascular/Lymphatic: No aneurysm identified in the visualized abdominal vasculature. No lymphadenopathy noted in the abdomen.  Other: Trace volume of ascites.  Musculoskeletal: No aggressive osseous lesion noted in the visualized portions of the skeleton.  IMPRESSION: 1. Unusual appearance of the liver where there appears to be some mild peripheral intrahepatic biliary ductal dilatation, most evident in segment 5, where there also appears to be some focal periportal edema/inflammation. This is poorly evaluated on today's study secondary to extensive patient respiratory motion. The possibility of a focal area of cholangitis should be considered. While a centrally obstructing lesion is not excluded, particularly in light of the narrowing of the proximal common hepatic  duct, a discrete lesion is not confidently identified on today's study. MRI  imaging in this elderly individual who appears unable to adequately hold her breath for the examination is very sub optimal, and any future follow-up should be performed as a contrast-enhanced CT scan (which is less prone to motion related artifacts). At this time, clinical correlation is recommended. Should further imaging followup be clinically appropriate, repeat CT of the abdomen with and without IV gadolinium in 3-6 months could be performed to exclude the possibility of a aggressive process such as a cholangiocarcinoma, which is simply not detected on today's examination. 2. Trace volume of ascites.   Electronically Signed   By: Vinnie Langton M.D.   On: 05/06/2015 09:33   I have personally reviewed and evaluated these images and lab results as part of my medical decision-making.   EKG Interpretation None      MDM   Final diagnoses:  Acute pancreatitis, unspecified pancreatitis type    Patient with abdominal pain. Found to have pancreatitis. Lipase is elevated. Will admit to internal medicine.    Davonna Belling, MD 05/06/15 1640

## 2015-05-03 NOTE — ED Notes (Addendum)
Pt c/o abdominal pain beginning on 05/01/2015. Vomiting began last night and occurred twice. Diarrhea occurred once today.

## 2015-05-03 NOTE — ED Notes (Signed)
Hospitalist at bedside 

## 2015-05-03 NOTE — ED Notes (Signed)
Pt unable to urinate after many attempts. Nurse got verbal order from MD Alvino Chapel for In and Out Cath

## 2015-05-04 DIAGNOSIS — K859 Acute pancreatitis, unspecified: Secondary | ICD-10-CM | POA: Diagnosis not present

## 2015-05-04 DIAGNOSIS — K850 Idiopathic acute pancreatitis: Secondary | ICD-10-CM

## 2015-05-04 DIAGNOSIS — D649 Anemia, unspecified: Secondary | ICD-10-CM

## 2015-05-04 LAB — CBC
HCT: 30.9 % — ABNORMAL LOW (ref 36.0–46.0)
HEMATOCRIT: 28.9 % — AB (ref 36.0–46.0)
HEMOGLOBIN: 10.1 g/dL — AB (ref 12.0–15.0)
Hemoglobin: 9.6 g/dL — ABNORMAL LOW (ref 12.0–15.0)
MCH: 28.7 pg (ref 26.0–34.0)
MCH: 28.5 pg (ref 26.0–34.0)
MCHC: 33.2 g/dL (ref 30.0–36.0)
MCHC: 32.7 g/dL (ref 30.0–36.0)
MCV: 86.5 fL (ref 78.0–100.0)
MCV: 87 fL (ref 78.0–100.0)
PLATELETS: 159 10*3/uL (ref 150–400)
PLATELETS: 156 10*3/uL (ref 150–400)
RBC: 3.34 MIL/uL — ABNORMAL LOW (ref 3.87–5.11)
RBC: 3.55 MIL/uL — ABNORMAL LOW (ref 3.87–5.11)
RDW: 14.9 % (ref 11.5–15.5)
RDW: 14.9 % (ref 11.5–15.5)
WBC: 11.7 10*3/uL — AB (ref 4.0–10.5)
WBC: 13.6 10*3/uL — ABNORMAL HIGH (ref 4.0–10.5)

## 2015-05-04 LAB — BASIC METABOLIC PANEL
Anion gap: 7 (ref 5–15)
BUN: 20 mg/dL (ref 6–20)
CHLORIDE: 104 mmol/L (ref 101–111)
CO2: 25 mmol/L (ref 22–32)
CREATININE: 1.02 mg/dL — AB (ref 0.44–1.00)
Calcium: 7.6 mg/dL — ABNORMAL LOW (ref 8.9–10.3)
GFR calc Af Amer: 54 mL/min — ABNORMAL LOW (ref >60)
GFR calc non Af Amer: 47 mL/min — ABNORMAL LOW (ref >60)
GLUCOSE: 89 mg/dL (ref 65–99)
POTASSIUM: 4 mmol/L (ref 3.5–5.1)
SODIUM: 136 mmol/L (ref 135–145)

## 2015-05-04 LAB — TYPE AND SCREEN
ABO/RH(D): O POS
ANTIBODY SCREEN: NEGATIVE

## 2015-05-04 LAB — IRON AND TIBC
Iron: 6 ug/dL — ABNORMAL LOW (ref 28–170)
SATURATION RATIOS: 2 % — AB (ref 10.4–31.8)
TIBC: 281 ug/dL (ref 250–450)
UIBC: 275 ug/dL

## 2015-05-04 LAB — VITAMIN B12: Vitamin B-12: 216 pg/mL (ref 180–914)

## 2015-05-04 LAB — FERRITIN: FERRITIN: 90 ng/mL (ref 11–307)

## 2015-05-04 LAB — RETICULOCYTES
RBC.: 3.53 MIL/uL — AB (ref 3.87–5.11)
RETIC COUNT ABSOLUTE: 70.6 10*3/uL (ref 19.0–186.0)
Retic Ct Pct: 2 % (ref 0.4–3.1)

## 2015-05-04 LAB — FOLATE: FOLATE: 18.2 ng/mL (ref >5.9)

## 2015-05-04 NOTE — Progress Notes (Signed)
[facility] PROGRESS NOTE  Jo Hardin VCB:[id number] DOB: 01/17/1922    PCP:   No primary care provider on file.   HPI: Jo Hardin is an 81 y.o. female with hx of arthritis, hypothyroidism, and right breast CA s/p mastectomy, admitted for pancreatitis of unclear etiology.  Her elevated LFTs were modest, but Lipase was in the 1600's.  She has slight leukocytosis, and her Hb dropped about 2 grams from 11.7 to 9.6 g per dL.  She has no melena or BRBPR.  She said she felt better today.   Rewiew of Systems:  Constitutional: Negative for malaise, fever and chills. No significant weight loss or weight gain Eyes: Negative for eye pain, redness and discharge, diplopia, visual changes, or flashes of light. ENMT: Negative for ear pain, hoarseness, nasal congestion, sinus pressure and sore throat. No headaches; tinnitus, drooling, or problem swallowing. Cardiovascular: Negative for chest pain, palpitations, diaphoresis, dyspnea and peripheral edema. ; No orthopnea, PND Respiratory: Negative for cough, hemoptysis, wheezing and stridor. No pleuritic chestpain. Gastrointestinal: Negative for nausea, vomiting, diarrhea, constipation, melena, blood in stool, hematemesis, jaundice and rectal bleeding.    Genitourinary: Negative for frequency, dysuria, incontinence,flank pain and hematuria; Musculoskeletal: Negative for back pain and neck pain. Negative for swelling and trauma.;  Skin: . Negative for pruritus, rash, abrasions, bruising and skin lesion.; ulcerations Neuro: Negative for headache, lightheadedness and neck stiffness. Negative for weakness, altered level of consciousness , altered mental status, extremity weakness, burning feet, involuntary movement, seizure and syncope.  Psych: negative for anxiety, depression, insomnia, tearfulness, panic attacks, hallucinations, paranoia, suicidal or homicidal ideation    Past Medical History  Diagnosis Date  . Arthritis   . Thyroid disease   . Cancer      had breast cancer 8 yrs ago    Past Surgical History  Procedure Laterality Date  . Breast surgery      Medications:  HOME MEDS: Prior to Admission medications   Medication Sig Start Date End Date Taking? Authorizing Provider  docusate sodium (COLACE) 100 MG capsule Take 100 mg by mouth every morning.   Yes Historical Provider, MD  fluticasone (FLONASE) 50 MCG/ACT nasal spray Place 1 spray into the nose 2 (two) times daily.   Yes Historical Provider, MD  levothyroxine (SYNTHROID, LEVOTHROID) 125 MCG tablet Take 125 mcg by mouth daily.   Yes Historical Provider, MD  losartan (COZAAR) 100 MG tablet Take 100 mg by mouth daily.   Yes Historical Provider, MD  meloxicam (MOBIC) 7.5 MG tablet Take 7.5 mg by mouth daily.   Yes Historical Provider, MD  Olopatadine HCl (PATADAY) 0.2 % SOLN Place 1 drop into both eyes daily.   Yes Historical Provider, MD  omeprazole (PRILOSEC) 20 MG capsule Take 20 mg by mouth daily.   Yes Historical Provider, MD  potassium chloride (K-DUR) 10 MEQ tablet Take 10 mEq by mouth 2 (two) times daily.   Yes Historical Provider, MD  simvastatin (ZOCOR) 10 MG tablet Take 10 mg by mouth at bedtime.   Yes Historical Provider, MD  traMADol (ULTRAM) 50 MG tablet Take 50 mg by mouth 2 (two) times daily.   Yes Historical Provider, MD  Vitamin D, Ergocalciferol, (DRISDOL) 50000 UNITS CAPS Take 50,000 Units by mouth every Tuesday.    Yes Historical Provider, MD     Allergies:  Allergies  Allergen Reactions  . Codeine Nausea And Vomiting    Social History:   reports that she has never smoked. She does not have any smokeless tobacco history  on file. She reports that she does not drink alcohol or use illicit drugs.  Family History: History reviewed. No pertinent family history.   Physical Exam: Filed Vitals:   05/03/15 2045 05/04/15 0510 05/04/15 0941 05/04/15 1454  BP: 116/54 118/58  125/60  Pulse: 90 88  84  Temp: 98.8 F (37.1 C) 99.6 F (37.6 C) 99.5 F (37.5  C) 99.1 F (37.3 C)  TempSrc: Oral Oral Oral Oral  Resp: 20 18  18   Height:      Weight:      SpO2: 96% 93%  97%   Blood pressure 125/60, pulse 84, temperature 99.1 F (37.3 C), temperature source Oral, resp. rate 18, height 4\' 11"  (1.499 m), weight 58.3 kg (128 lb 8.5 oz), SpO2 97 %.  GEN:  Pleasant  patient lying in the stretcher in no acute distress; cooperative with exam. PSYCH:  alert and oriented x4; does not appear anxious or depressed; affect is appropriate. HEENT: Mucous membranes pink and anicteric; PERRLA; EOM intact; no cervical lymphadenopathy nor thyromegaly or carotid bruit; no JVD; There were no stridor. Neck is very supple. Breasts:: Not examined CHEST WALL: No tenderness CHEST: Normal respiration, clear to auscultation bilaterally.  HEART: Regular rate and rhythm.  There are no murmur, rub, or gallops.   BACK: No kyphosis or scoliosis; no CVA tenderness ABDOMEN: soft and tender in the epigastrium.  no masses, no organomegaly, normal abdominal bowel sounds; no pannus; no intertriginous candida. There is no rebound and no distention. Rectal Exam: Not done EXTREMITIES: No bone or joint deformity; age-appropriate arthropathy of the hands and knees; no edema; no ulcerations.  There is no calf tenderness. Genitalia: not examined PULSES: 2+ and symmetric SKIN: Normal hydration no rash or ulceration CNS: Cranial nerves 2-12 grossly intact no focal lateralizing neurologic deficit.  Speech is fluent; uvula elevated with phonation, facial symmetry and tongue midline. DTR are normal bilaterally, cerebella exam is intact, barbinski is negative and strengths are equaled bilaterally.  No sensory loss.   Labs on Admission:  Basic Metabolic Panel:  Recent Labs Lab 05/03/15 1440 05/04/15 0550  NA 138 136  K 4.0 4.0  CL 104 104  CO2 23 25  GLUCOSE 105* 89  BUN 19 20  CREATININE 1.19* 1.02*  CALCIUM 8.8* 7.6*   Liver Function Tests:  Recent Labs Lab 05/03/15 1440  AST  112*  ALT 67*  ALKPHOS 95  BILITOT 1.0  PROT 7.1  ALBUMIN 3.7    Recent Labs Lab 05/03/15 1440 05/03/15 1820  LIPASE 1757* 1645*   CBC:  Recent Labs Lab 05/03/15 1440 05/04/15 0550  WBC 12.1* 11.7*  NEUTROABS 11.0*  --   HGB 11.7* 9.6*  HCT 35.7* 28.9*  MCV 86.7 86.5  PLT 223 159    Radiological Exams on Admission: Ct Abdomen Pelvis W Contrast  05/03/2015   CLINICAL DATA:  Abdominal pain and vomiting. Diarrhea. History breast cancer 8 years ago.  EXAM: CT ABDOMEN AND PELVIS WITH CONTRAST  TECHNIQUE: Multidetector CT imaging of the abdomen and pelvis was performed using the standard protocol following bolus administration of intravenous contrast.  CONTRAST:  58mL OMNIPAQUE IOHEXOL 300 MG/ML SOLN, 45mL OMNIPAQUE IOHEXOL 300 MG/ML SOLN  COMPARISON:  None.  FINDINGS: Lower chest: Cardiac enlargement. Pulmonary artery enlargement. Mild atelectasis in the lung bases. No effusion or pneumonia.  Hepatobiliary: Linear hypodensity in the right lobe liver may represent Mild biliary dilatation. Central bile ducts mildly prominent however common bile duct is not dilated measuring 6 mm.  Gallbladder wall shows hyper enhancement without significant thickening. There is fluid around the gallbladder which may be related to ascites. Small amount of ascites is also noted in the infrahepatic region. No liver mass lesion.  Pancreas: Mild peripancreatic edema raises the possibility of pancreatitis. No pancreatic mass or calcification. There is mild edema extending into the mesentery.  Spleen: Negative  Adrenals/Urinary Tract: Normal kidneys. No renal mass or obstruction. No renal calculi. Urinary bladder normal.  Stomach/Bowel: Negative for bowel obstruction. No significant bowel edema. Normal appendix.  Vascular/Lymphatic: Atherosclerotic aorta with tortuosity but no aneurysm.  Reproductive: Uterus not enlarged. Small uterine calcifications compatible with chronic fibroid.  Other: No free fluid in the pelvis.  Mild amount of ascites around the liver and gallbladder. No adenopathy.  Musculoskeletal: Extensive disc degeneration and spondylosis diffusely. Negative for acute fracture. Schmorl's node inferior endplate of I26  IMPRESSION: Mild ascites in the upper abdomen around the gallbladder and liver.  Mild peripancreatic edema extending into the mesenteric, possibly due to pancreatitis.  Central bile ducts mildly prominent however common bile duct is normal at 6 mm. Linear branching hypodensity in the right lobe liver may represent a mildly dilated bile duct.   Electronically Signed   By: Franchot Gallo M.D.   On: 05/03/2015 16:26     Assessment/Plan Present on Admission:  . Pancreatitis, acute Anemia Hx of breast CA.   PLAN:    Acute pancreatitis:  Will continue with bowel rest.  IV pain meds PRN.  Etiology uncertain.  Could be a passed stone.  Anemia:  Will send anemia panel, check stool for OB.  Serial H and H every 8 hours, with type and screen.  Could be dilution and plebotomy.   Hx of breast CA:  Stable.  Other plans as per orders.  Code Status FULL Haskel Khan, MD. [facility] Pager [phone] 7pm to 7am.  05/04/2015, 4:18 PM

## 2015-05-04 NOTE — Plan of Care (Signed)
Problem: Phase I Progression Outcomes Goal: OOB as tolerated unless otherwise ordered Outcome: Progressing Pt has been using the bedside commode. She still appears to be weak and unsteady and needs assistance to [facility]. Pt states she uses a cane at home, which is at the bedside also.  Goal: Initial discharge plan identified Outcome: Completed/Met Date Met:  05/04/15 See MD's notes.  Goal: Voiding-avoid urinary catheter unless indicated Outcome: Progressing Pt did use the bedside commode last night. Will continue to monitor her output.  Goal: Hemodynamically stable Outcome: Progressing See flowsheet.

## 2015-05-05 DIAGNOSIS — R112 Nausea with vomiting, unspecified: Secondary | ICD-10-CM

## 2015-05-05 DIAGNOSIS — D509 Iron deficiency anemia, unspecified: Secondary | ICD-10-CM

## 2015-05-05 DIAGNOSIS — K859 Acute pancreatitis, unspecified: Secondary | ICD-10-CM

## 2015-05-05 LAB — CBC
HCT: 27.8 % — ABNORMAL LOW (ref 36.0–46.0)
HEMATOCRIT: 26.9 % — AB (ref 36.0–46.0)
HEMOGLOBIN: 9 g/dL — AB (ref 12.0–15.0)
Hemoglobin: 9.4 g/dL — ABNORMAL LOW (ref 12.0–15.0)
MCH: 28.8 pg (ref 26.0–34.0)
MCH: 29.2 pg (ref 26.0–34.0)
MCHC: 33.5 g/dL (ref 30.0–36.0)
MCHC: 33.8 g/dL (ref 30.0–36.0)
MCV: 85.9 fL (ref 78.0–100.0)
MCV: 86.3 fL (ref 78.0–100.0)
PLATELETS: 138 10*3/uL — AB (ref 150–400)
Platelets: 147 10*3/uL — ABNORMAL LOW (ref 150–400)
RBC: 3.13 MIL/uL — AB (ref 3.87–5.11)
RBC: 3.22 MIL/uL — AB (ref 3.87–5.11)
RDW: 15.1 % (ref 11.5–15.5)
RDW: 15 % (ref 11.5–15.5)
WBC: 12 10*3/uL — ABNORMAL HIGH (ref 4.0–10.5)
WBC: 14.4 10*3/uL — AB (ref 4.0–10.5)

## 2015-05-05 LAB — COMPREHENSIVE METABOLIC PANEL
ALK PHOS: 78 U/L (ref 38–126)
ALT: 30 U/L (ref 14–54)
ANION GAP: 6 (ref 5–15)
AST: 27 U/L (ref 15–41)
Albumin: 2.7 g/dL — ABNORMAL LOW (ref 3.5–5.0)
BILIRUBIN TOTAL: 0.5 mg/dL (ref 0.3–1.2)
BUN: 22 mg/dL — ABNORMAL HIGH (ref 6–20)
CALCIUM: 7.6 mg/dL — AB (ref 8.9–10.3)
CO2: 24 mmol/L (ref 22–32)
CREATININE: 0.87 mg/dL (ref 0.44–1.00)
Chloride: 106 mmol/L (ref 101–111)
GFR calc non Af Amer: 57 mL/min — ABNORMAL LOW (ref >60)
Glucose, Bld: 86 mg/dL (ref 65–99)
Potassium: 3.5 mmol/L (ref 3.5–5.1)
Sodium: 136 mmol/L (ref 135–145)
TOTAL PROTEIN: 5.4 g/dL — AB (ref 6.5–8.1)

## 2015-05-05 LAB — LIPASE, BLOOD: Lipase: 649 U/L — ABNORMAL HIGH (ref 22–51)

## 2015-05-05 MED ORDER — DEXTROSE 5 % IV SOLN
1.0000 g | INTRAVENOUS | Status: DC
  Filled 2015-05-05: qty 10

## 2015-05-05 MED ORDER — DEXTROSE 5 % IV SOLN
2.0000 g | INTRAVENOUS | Status: DC
  Filled 2015-05-05 (×2): qty 2

## 2015-05-05 MED ORDER — DEXTROSE 5 % IV SOLN
1.0000 g | INTRAVENOUS | Status: DC
  Administered 2015-05-05 – 2015-05-08 (×3): 1 g via INTRAVENOUS
  Filled 2015-05-05 (×7): qty 10

## 2015-05-05 MED ORDER — CEFTRIAXONE SODIUM 1 G IJ SOLR
1.0000 g | INTRAMUSCULAR | Status: DC
  Filled 2015-05-05 (×2): qty 10

## 2015-05-05 NOTE — Consult Note (Signed)
Referring Provider: Raynelle Chary, MD Primary Care Physician:  No primary care provider on file. Primary Gastroenterologist:  Dr. Laural Golden  Reason for Consultation:    Acute pancreatitis and iron deficiency anemia.  HPI:   Patient is 81 year old African-American female was admitted to hospitalist service 2 days ago when she presented with 2-3 day history of upper abdominal pain with nausea and vomiting. History is provided with patient's daughter  Otelia Sergeant who is at bedside. Patient was in usual state of health until about 6-8 weeks ago when she developed abdominal pain and was taken to emergency room at [facility] in [place]. She had abdominal CT and was told she was constipated. She was placed in a stool softer. Since then she's had intermittent abdominal pain but has been mild. 2 days prior to admission after evening meal she developed severe pain across upper abdomen with nausea and vomiting. Her daughter states she had multiple episodes of vomiting but no hematemesis noted. She did not experience fever chest pain or shortness of breath. Her pain continued. She came to emergency room 2 days ago. Evaluation in emergency room revealed that she had acute pancreatitis. She was also noted to be anemic. With hydration her hemoglobin has dropped. She continues to complain of upper abdominal pain but she states is not as bad as it was a few days ago. She denies melena or rectal bleeding. She has arthritis and has been on meloxicam for a few years if not more. She does not take other OTC NSAIDs. There is no history of peptic ulcer disease. She denies heartburn or dysphagia. She has never been screened for CRC. Her daughter states she has lost few pounds this year. But she generally has had good appetite. Patient lives with her daughter. She is able to ambulate in the house. She has never smoked cigarettes and does not drink alcohol. She has one daughter living. Her son died in his late 66s early  15s due to injuries sustained in late now more. Her daughter had end-stage renal disease and also died in her 60s.   Past Medical History  Diagnosis Date  . Arthritis   . Thyroid disease   . Cancer     had breast cancer 8 yrs ago    Past Surgical History  Procedure Laterality Date  . Breast surgery      Prior to Admission medications   Medication Sig Start Date End Date Taking? Authorizing Provider  docusate sodium (COLACE) 100 MG capsule Take 100 mg by mouth every morning.   Yes Historical Provider, MD  fluticasone (FLONASE) 50 MCG/ACT nasal spray Place 1 spray into the nose 2 (two) times daily.   Yes Historical Provider, MD  levothyroxine (SYNTHROID, LEVOTHROID) 125 MCG tablet Take 125 mcg by mouth daily.   Yes Historical Provider, MD  losartan (COZAAR) 100 MG tablet Take 100 mg by mouth daily.   Yes Historical Provider, MD  meloxicam (MOBIC) 7.5 MG tablet Take 7.5 mg by mouth daily.   Yes Historical Provider, MD  Olopatadine HCl (PATADAY) 0.2 % SOLN Place 1 drop into both eyes daily.   Yes Historical Provider, MD  omeprazole (PRILOSEC) 20 MG capsule Take 20 mg by mouth daily.   Yes Historical Provider, MD  potassium chloride (K-DUR) 10 MEQ tablet Take 10 mEq by mouth 2 (two) times daily.   Yes Historical Provider, MD  simvastatin (ZOCOR) 10 MG tablet Take 10 mg by mouth at bedtime.   Yes Historical Provider, MD  traMADol (  ULTRAM) 50 MG tablet Take 50 mg by mouth 2 (two) times daily.   Yes Historical Provider, MD  Vitamin D, Ergocalciferol, (DRISDOL) 50000 UNITS CAPS Take 50,000 Units by mouth every Tuesday.    Yes Historical Provider, MD    Current Facility-Administered Medications  Medication Dose Route Frequency Provider Last Rate Last Dose  . 0.9 %  sodium chloride infusion   Intravenous Continuous Truett Mainland, DO 125 mL/hr at 05/05/15 0850    . acetaminophen (TYLENOL) tablet 650 mg  650 mg Oral Q6H PRN Truett Mainland, DO   650 mg at 05/04/15 2027   Or  . acetaminophen  (TYLENOL) suppository 650 mg  650 mg Rectal Q6H PRN Truett Mainland, DO      . alum & mag hydroxide-simeth (MAALOX/MYLANTA) 200-200-20 MG/5ML suspension 30 mL  30 mL Oral Q6H PRN Tanna Savoy Stinson, DO      . docusate sodium (COLACE) capsule 100 mg  100 mg Oral q morning - 10a Tanna Savoy Stinson, DO   100 mg at 05/05/15 2426  . fluticasone (FLONASE) 50 MCG/ACT nasal spray 1 spray  1 spray Each Nare BID Truett Mainland, DO   1 spray at 05/05/15 [phone]  . levothyroxine (SYNTHROID, LEVOTHROID) tablet 125 mcg  125 mcg Oral QAC breakfast Truett Mainland, DO   125 mcg at 05/05/15 9622  . losartan (COZAAR) tablet 100 mg  100 mg Oral Daily Tanna Savoy Stinson, DO   100 mg at 05/05/15 2979  . ondansetron (ZOFRAN) tablet 4 mg  4 mg Oral Q6H PRN Tanna Savoy Stinson, DO       Or  . ondansetron [facility]) injection 4 mg  4 mg Intravenous Q6H PRN Tanna Savoy Stinson, DO      . pantoprazole (PROTONIX) EC tablet 40 mg  40 mg Oral Daily Tanna Savoy Stinson, DO   40 mg at 05/05/15 0853  . senna-docusate (Senokot-S) tablet 1 tablet  1 tablet Oral QHS PRN Tanna Savoy Stinson, DO      . simvastatin (ZOCOR) tablet 10 mg  10 mg Oral QHS Tanna Savoy Stinson, DO   10 mg at 05/04/15 2217  . traMADol (ULTRAM) tablet 50 mg  50 mg Oral BID Tanna Savoy Stinson, DO   50 mg at 05/05/15 8921    Allergies as of 05/03/2015 - Review Complete 05/03/2015  Allergen Reaction Noted  . Codeine Nausea And Vomiting 05/03/2015    History reviewed. No pertinent family history.  Social History   Social History  . Marital Status: Widowed    Spouse Name: N/A  . Number of Children: N/A  . Years of Education: N/A   Occupational History  . Not on file.   Social History Main Topics  . Smoking status: Never Smoker   . Smokeless tobacco: Not on file  . Alcohol Use: No  . Drug Use: No  . Sexual Activity: Not on file   Other Topics Concern  . Not on file   Social History Narrative    Review of Systems: See HPI, otherwise normal ROS  Physical Exam: Temp:  [98.7  F (37.1 C)-100.7 F (38.2 C)] 98.7 F (37.1 C) (09/05 0558) Pulse Rate:  [77-88] 77 (09/05 0558) Resp:  [18] 18 (09/05 0558) BP: (124-138)/(50-69) 138/69 mmHg (09/05 0558) SpO2:  [90 %-97 %] 97 % (09/05 0558) Last BM Date: 05/03/15 Patient is alert and in no acute distress. Conjunctiva was patent. Sclerae nonicteric. Oropharyngeal mucosa is normal. She is edentulous. No neck masses  or thyromegaly noted. Cardiac exam with regular rhythm normal S1 and S2. Faint systolic ejection murmur noted at aortic area. Lungs are clear to auscultation. Abdomen is symmetrical. Bowel sounds are normal. On palpation abdomen is soft with mild generalized tenderness but tenderness is moderate at epigastrium with guarding but no rebound. No organomegaly or masses. No peripheral edema noted. She has 2 but drawn scone traction or involving both hands with flexion deformity to ring finger bilaterally.    Intake/Output from previous day: 09/04 0701 - 09/05 0700 In: 3183.3 [P.O.:1700; I.V.:1483.3] Out: 400 [Urine:400] Intake/Output this shift:    Lab Results:  Recent Labs  05/04/15 1643 05/05/15 0114 05/05/15 0805  WBC 13.6* 12.0* 14.4*  HGB 10.1* 9.0* 9.4*  HCT 30.9* 26.9* 27.8*  PLT 156 147* 138*   BMET  Recent Labs  05/03/15 1440 05/04/15 0550 05/05/15 0544  NA 138 136 136  K 4.0 4.0 3.5  CL 104 104 106  CO2 23 25 24   GLUCOSE 105* 89 86  BUN 19 20 22*  CREATININE 1.19* 1.02* 0.87  CALCIUM 8.8* 7.6* 7.6*   LFT  Recent Labs  05/05/15 0544  PROT 5.4*  ALBUMIN 2.7*  AST 27  ALT 30  ALKPHOS 78  BILITOT 0.5    Studies/Results: Ct Abdomen Pelvis W Contrast  05/03/2015   CLINICAL DATA:  Abdominal pain and vomiting. Diarrhea. History breast cancer 8 years ago.  EXAM: CT ABDOMEN AND PELVIS WITH CONTRAST  TECHNIQUE: Multidetector CT imaging of the abdomen and pelvis was performed using the standard protocol following bolus administration of intravenous contrast.  CONTRAST:  26mL  OMNIPAQUE IOHEXOL 300 MG/ML SOLN, 23mL OMNIPAQUE IOHEXOL 300 MG/ML SOLN  COMPARISON:  None.  FINDINGS: Lower chest: Cardiac enlargement. Pulmonary artery enlargement. Mild atelectasis in the lung bases. No effusion or pneumonia.  Hepatobiliary: Linear hypodensity in the right lobe liver may represent Mild biliary dilatation. Central bile ducts mildly prominent however common bile duct is not dilated measuring 6 mm. Gallbladder wall shows hyper enhancement without significant thickening. There is fluid around the gallbladder which may be related to ascites. Small amount of ascites is also noted in the infrahepatic region. No liver mass lesion.  Pancreas: Mild peripancreatic edema raises the possibility of pancreatitis. No pancreatic mass or calcification. There is mild edema extending into the mesentery.  Spleen: Negative  Adrenals/Urinary Tract: Normal kidneys. No renal mass or obstruction. No renal calculi. Urinary bladder normal.  Stomach/Bowel: Negative for bowel obstruction. No significant bowel edema. Normal appendix.  Vascular/Lymphatic: Atherosclerotic aorta with tortuosity but no aneurysm.  Reproductive: Uterus not enlarged. Small uterine calcifications compatible with chronic fibroid.  Other: No free fluid in the pelvis. Mild amount of ascites around the liver and gallbladder. No adenopathy.  Musculoskeletal: Extensive disc degeneration and spondylosis diffusely. Negative for acute fracture. Schmorl's node inferior endplate of U04  IMPRESSION: Mild ascites in the upper abdomen around the gallbladder and liver.  Mild peripancreatic edema extending into the mesenteric, possibly due to pancreatitis.  Central bile ducts mildly prominent however common bile duct is normal at 6 mm. Linear branching hypodensity in the right lobe liver may represent a mildly dilated bile duct.   Electronically Signed   By: Franchot Gallo M.D.   On: 05/03/2015 16:26    Assessment;  Patient is 81 year old African-American female  who presents with 2 day history of upper abdominal pain with nausea and vomiting and found to have acute pancreatitis. She also has what appears to be iron deficiency anemia. Transaminases  are mildly elevated on admission but now are normal. CT raises suspicion of distal CBD stone. She could also have penetrating gastric ulcer given chronic NSAID therapy. She will need ERCP if she has common duct stones otherwise will proceed with diagnostic EGD when pancreatitis improves. Iron deficiency anemia. Patient has been on chronic NSAID therapy and therefore could've been losing blood from upper GI tract.  Recommendations;  Ceftriaxone 1 g IV every 24 hours. Nothing by mouth except by mouth meds and ice chips. MRCP in a.m. Diagnostic esophagogastroduodenoscopy later this week. She will also need ERCP if she has common duct stones. Hemoccults 1   LOS: 2 days   REHMAN,NAJEEB U  05/05/2015, 11:42 AM

## 2015-05-05 NOTE — Progress Notes (Signed)
[facility] PROGRESS NOTE  Jo Hardin YOV:[id number] DOB: Nov 14, 1921    PCP:   No primary care provider on file.   HPI: Jo Hardin is an 81 y.o. female with hx of hypothyrodism, breast cancer s/p right mastectomy, admitted for acute pancreatitis.  She has an iron deficiency anemia, and did have a 2 grams Hb drop, but remained stable.  Dr Laural Golden was consulted, who recommended IV antibiotics, along with MRCP, with intention of doing ERCP if she has choledocholithiasis, otherwise, he will do EGD as she has been on NSAIDS.  She is stable and her epigastric pain has improved, and her Lipase is pending for today.    Rewiew of Systems:  Constitutional: Negative for malaise, fever and chills. No significant weight loss or weight gain Eyes: Negative for eye pain, redness and discharge, diplopia, visual changes, or flashes of light. ENMT: Negative for ear pain, hoarseness, nasal congestion, sinus pressure and sore throat. No headaches; tinnitus, drooling, or problem swallowing. Cardiovascular: Negative for chest pain, palpitations, diaphoresis, dyspnea and peripheral edema. ; No orthopnea, PND Respiratory: Negative for cough, hemoptysis, wheezing and stridor. No pleuritic chestpain. Gastrointestinal: Negative for nausea, vomiting, diarrhea, constipation, abdominal pain, melena, blood in stool, hematemesis, jaundice and rectal bleeding.    Genitourinary: Negative for frequency, dysuria, incontinence,flank pain and hematuria; Musculoskeletal: Negative for back pain and neck pain. Negative for swelling and trauma.;  Skin: . Negative for pruritus, rash, abrasions, bruising and skin lesion.; ulcerations Neuro: Negative for headache, lightheadedness and neck stiffness. Negative for weakness, altered level of consciousness , altered mental status, extremity weakness, burning feet, involuntary movement, seizure and syncope.  Psych: negative for anxiety, depression, insomnia, tearfulness, panic attacks,  hallucinations, paranoia, suicidal or homicidal ideation    Past Medical History  Diagnosis Date  . Arthritis   . Thyroid disease   . Cancer     had breast cancer 8 yrs ago    Past Surgical History  Procedure Laterality Date  . Breast surgery      Medications:  HOME MEDS: Prior to Admission medications   Medication Sig Start Date End Date Taking? Authorizing Provider  docusate sodium (COLACE) 100 MG capsule Take 100 mg by mouth every morning.   Yes Historical Provider, MD  fluticasone (FLONASE) 50 MCG/ACT nasal spray Place 1 spray into the nose 2 (two) times daily.   Yes Historical Provider, MD  levothyroxine (SYNTHROID, LEVOTHROID) 125 MCG tablet Take 125 mcg by mouth daily.   Yes Historical Provider, MD  losartan (COZAAR) 100 MG tablet Take 100 mg by mouth daily.   Yes Historical Provider, MD  meloxicam (MOBIC) 7.5 MG tablet Take 7.5 mg by mouth daily.   Yes Historical Provider, MD  Olopatadine HCl (PATADAY) 0.2 % SOLN Place 1 drop into both eyes daily.   Yes Historical Provider, MD  omeprazole (PRILOSEC) 20 MG capsule Take 20 mg by mouth daily.   Yes Historical Provider, MD  potassium chloride (K-DUR) 10 MEQ tablet Take 10 mEq by mouth 2 (two) times daily.   Yes Historical Provider, MD  simvastatin (ZOCOR) 10 MG tablet Take 10 mg by mouth at bedtime.   Yes Historical Provider, MD  traMADol (ULTRAM) 50 MG tablet Take 50 mg by mouth 2 (two) times daily.   Yes Historical Provider, MD  Vitamin D, Ergocalciferol, (DRISDOL) 50000 UNITS CAPS Take 50,000 Units by mouth every Tuesday.    Yes Historical Provider, MD     Allergies:  Allergies  Allergen Reactions  . Codeine Nausea And Vomiting  Social History:   reports that she has never smoked. She does not have any smokeless tobacco history on file. She reports that she does not drink alcohol or use illicit drugs.  Family History: History reviewed. No pertinent family history.   Physical Exam: Filed Vitals:   05/04/15  0941 05/04/15 1454 05/04/15 2105 05/05/15 0558  BP:  125/60 124/50 138/69  Pulse:  84 88 77  Temp: 99.5 F (37.5 C) 99.1 F (37.3 C) 100.7 F (38.2 C) 98.7 F (37.1 C)  TempSrc: Oral Oral Oral Oral  Resp:  18 18 18   Height:      Weight:      SpO2:  97% 90% 97%   Blood pressure 138/69, pulse 77, temperature 98.7 F (37.1 C), temperature source Oral, resp. rate 18, height 4\' 11"  (1.499 m), weight 58.3 kg (128 lb 8.5 oz), SpO2 97 %.  GEN:  Pleasant  patient lying in the stretcher in no acute distress; cooperative with exam. PSYCH:  alert and oriented x4; does not appear anxious or depressed; affect is appropriate. HEENT: Mucous membranes pink and anicteric; PERRLA; EOM intact; no cervical lymphadenopathy nor thyromegaly or carotid bruit; no JVD; There were no stridor. Neck is very supple. Breasts:: Not examined CHEST WALL: No tenderness CHEST: Normal respiration, clear to auscultation bilaterally.  HEART: Regular rate and rhythm.  There are no murmur, rub, or gallops.   BACK: No kyphosis or scoliosis; no CVA tenderness ABDOMEN: soft and non-tender; no masses, no organomegaly, normal abdominal bowel sounds; no pannus; no intertriginous candida. There is no rebound and no distention. Rectal Exam: Not done EXTREMITIES: No bone or joint deformity; age-appropriate arthropathy of the hands and knees; no edema; no ulcerations.  There is no calf tenderness. Genitalia: not examined PULSES: 2+ and symmetric SKIN: Normal hydration no rash or ulceration CNS: Cranial nerves 2-12 grossly intact no focal lateralizing neurologic deficit.  Speech is fluent; uvula elevated with phonation, facial symmetry and tongue midline. DTR are normal bilaterally, cerebella exam is intact, barbinski is negative and strengths are equaled bilaterally.  No sensory loss.   Labs on Admission:  Basic Metabolic Panel:  Recent Labs Lab 05/03/15 1440 05/04/15 0550 05/05/15 0544  NA 138 136 136  K 4.0 4.0 3.5  CL 104  104 106  CO2 23 25 24   GLUCOSE 105* 89 86  BUN 19 20 22*  CREATININE 1.19* 1.02* 0.87  CALCIUM 8.8* 7.6* 7.6*   Liver Function Tests:  Recent Labs Lab 05/03/15 1440 05/05/15 0544  AST 112* 27  ALT 67* 30  ALKPHOS 95 78  BILITOT 1.0 0.5  PROT 7.1 5.4*  ALBUMIN 3.7 2.7*    Recent Labs Lab 05/03/15 1440 05/03/15 1820  LIPASE 1757* 1645*   No results for input(s): AMMONIA in the last 168 hours. CBC:  Recent Labs Lab 05/03/15 1440 05/04/15 0550 05/04/15 1643 05/05/15 0114 05/05/15 0805  WBC 12.1* 11.7* 13.6* 12.0* 14.4*  NEUTROABS 11.0*  --   --   --   --   HGB 11.7* 9.6* 10.1* 9.0* 9.4*  HCT 35.7* 28.9* 30.9* 26.9* 27.8*  MCV 86.7 86.5 87.0 85.9 86.3  PLT 223 159 156 147* 138*    Radiological Exams on Admission: Ct Abdomen Pelvis W Contrast  05/03/2015   CLINICAL DATA:  Abdominal pain and vomiting. Diarrhea. History breast cancer 8 years ago.  EXAM: CT ABDOMEN AND PELVIS WITH CONTRAST  TECHNIQUE: Multidetector CT imaging of the abdomen and pelvis was performed using the standard protocol following  bolus administration of intravenous contrast.  CONTRAST:  83mL OMNIPAQUE IOHEXOL 300 MG/ML SOLN, 6mL OMNIPAQUE IOHEXOL 300 MG/ML SOLN  COMPARISON:  None.  FINDINGS: Lower chest: Cardiac enlargement. Pulmonary artery enlargement. Mild atelectasis in the lung bases. No effusion or pneumonia.  Hepatobiliary: Linear hypodensity in the right lobe liver may represent Mild biliary dilatation. Central bile ducts mildly prominent however common bile duct is not dilated measuring 6 mm. Gallbladder wall shows hyper enhancement without significant thickening. There is fluid around the gallbladder which may be related to ascites. Small amount of ascites is also noted in the infrahepatic region. No liver mass lesion.  Pancreas: Mild peripancreatic edema raises the possibility of pancreatitis. No pancreatic mass or calcification. There is mild edema extending into the mesentery.  Spleen:  Negative  Adrenals/Urinary Tract: Normal kidneys. No renal mass or obstruction. No renal calculi. Urinary bladder normal.  Stomach/Bowel: Negative for bowel obstruction. No significant bowel edema. Normal appendix.  Vascular/Lymphatic: Atherosclerotic aorta with tortuosity but no aneurysm.  Reproductive: Uterus not enlarged. Small uterine calcifications compatible with chronic fibroid.  Other: No free fluid in the pelvis. Mild amount of ascites around the liver and gallbladder. No adenopathy.  Musculoskeletal: Extensive disc degeneration and spondylosis diffusely. Negative for acute fracture. Schmorl's node inferior endplate of W09  IMPRESSION: Mild ascites in the upper abdomen around the gallbladder and liver.  Mild peripancreatic edema extending into the mesenteric, possibly due to pancreatitis.  Central bile ducts mildly prominent however common bile duct is normal at 6 mm. Linear branching hypodensity in the right lobe liver may represent a mildly dilated bile duct.   Electronically Signed   By: Franchot Gallo M.D.   On: 05/03/2015 16:26    Assessment/Plan Present on Admission:  . Pancreatitis, acute Anemia Elevated LFTs.  PLAN: Acute pancreatitis, unclear exact etiology.  Per GI, will proceed with MRCP tomorrow, and consider ERCP or EGD, as she has concomitant iron deficiency anemia.  She never had coloncoscopy or prior EGD.   She is otherwise stable.  Continue IVF and NPO.   Other plans as per orders.  Code Status: FULL Haskel Khan, MD. [facility] Pager [phone] 7pm to 7am.  05/05/2015, 12:57 PM

## 2015-05-06 ENCOUNTER — Inpatient Hospital Stay (HOSPITAL_COMMUNITY): Payer: Medicare Other

## 2015-05-06 DIAGNOSIS — D500 Iron deficiency anemia secondary to blood loss (chronic): Secondary | ICD-10-CM

## 2015-05-06 LAB — CBC
HCT: 29.1 % — ABNORMAL LOW (ref 36.0–46.0)
Hemoglobin: 9.8 g/dL — ABNORMAL LOW (ref 12.0–15.0)
MCH: 29.2 pg (ref 26.0–34.0)
MCHC: 33.7 g/dL (ref 30.0–36.0)
MCV: 86.6 fL (ref 78.0–100.0)
PLATELETS: 132 10*3/uL — AB (ref 150–400)
RBC: 3.36 MIL/uL — ABNORMAL LOW (ref 3.87–5.11)
RDW: 15.1 % (ref 11.5–15.5)
WBC: 11.4 10*3/uL — AB (ref 4.0–10.5)

## 2015-05-06 LAB — PROTIME-INR
INR: 1.16 (ref 0.00–1.49)
Prothrombin Time: 15 seconds (ref 11.6–15.2)

## 2015-05-06 MED ORDER — GADOBENATE DIMEGLUMINE 529 MG/ML IV SOLN
10.0000 mL | Freq: Once | INTRAVENOUS | Status: AC | PRN
  Administered 2015-05-06: 10 mL via INTRAVENOUS

## 2015-05-06 NOTE — Care Management Note (Signed)
Case Management Note  Patient Details  Name: Audelia Claycomb MRN: [id number] Date of Birth: March 28, 1922  Expected Discharge Date:  05/07/15               Expected Discharge Plan:  [place]  In-House Referral:  NA  Discharge planning Services  CM Consult  Post Acute Care Choice:    Choice offered to:     DME Arranged:    DME Agency:     HH Arranged:    HH Agency:     Status of Service:  In process, will continue to follow  Medicare Important Message Given:    Date Medicare IM Given:    Medicare IM give by:    Date Additional Medicare IM Given:    Additional Medicare Important Message give by:     If discussed at [place] of Stay Meetings, dates discussed:    Additional Comments: Pt is from home, lives with her daughter and is mostly ind with ADL's. Pt admitte for pancreatitis. Has no HH services or DME's prior to admission. Pt given list of area PCP's as she is looking to change to a local PCP since moving in with daughter, her PCP is in [employer]. Pt will have to have name changed on medicare/medicaid card. No CM needs anticipated. Pt will have PT eval prior to DC.  Sherald Barge, RN 05/06/2015, 12:38 PM

## 2015-05-06 NOTE — Progress Notes (Signed)
  Subjective:  Patient feels better. She does not have an appetite but she denies nausea or sharp abdominal pain. She now complains of soreness across upper abdomen. Her daughter states she had a blowout and passed stool and gas this morning. No melena or rectal bleeding reported.    Objective: Blood pressure 174/74, pulse 77, temperature 98.7 F (37.1 C), temperature source Oral, resp. rate 18, height 4\' 11"  (1.499 m), weight 128 lb 8.5 oz (58.3 kg), SpO2 93 %. Patient is alert and in no acute distress. No neck masses or thyromegaly noted. Cardiac exam with regular rhythm normal S1 and S2. Faint SEM at aortic area. Lungs are clear to auscultation. Abdomen is symmetrical. Bowel sounds are normal. On palpation abdomen is soft with mild generalized tenderness. No guarding or rebound noted. No LE edema or clubbing noted. She has tremor to her right hand.  Labs/studies Results:   Recent Labs  05/05/15 0114 05/05/15 0805 05/06/15 0510  WBC 12.0* 14.4* 11.4*  HGB 9.0* 9.4* 9.8*  HCT 26.9* 27.8* 29.1*  PLT 147* 138* 132*    BMET   Recent Labs  05/03/15 1440 05/04/15 0550 05/05/15 0544  NA 138 136 136  K 4.0 4.0 3.5  CL 104 104 106  CO2 23 25 24   GLUCOSE 105* 89 86  BUN 19 20 22*  CREATININE 1.19* 1.02* 0.87  CALCIUM 8.8* 7.6* 7.6*    LFT   Recent Labs  05/03/15 1440 05/05/15 0544  PROT 7.1 5.4*  ALBUMIN 3.7 2.7*  AST 112* 27  ALT 67* 30  ALKPHOS 95 78  BILITOT 1.0 0.5    PT/INR   Recent Labs  05/06/15 0510  LABPROT 15.0  INR 1.16    MRCP results noted. Study read by Dr. Vinnie Langton. Films reviewed with Dr. Thornton Papas. There is filling defects or junction of common hepatic and common bile duct. CHD proximal to this defect is not dilated. There is focal periportal edema and unusual appearance to liver. No pancreatic mass or ductal dilation noted.  Assessment:  #1. Acute pancreatitis felt to be biliary pancreatitis based on elevated AST and ALT on  admission but no evidence of cholelithiasis or choledocholithiasis. MRCP reveals defect or junction of CHD and CBD. Proximal system is nondilated therefore this could be an artifact or a stone not obstructing the system. Unless transaminases rise will monitor patient and repeat study in 2-3 months. As far as pancreatitis is concerned she is feeling better. Leukocytosis improving and corrected serum calcium is 8.6. Admission reviewed with patient's daughter Otelia Sergeant who is at bedside.  #2. Iron deficiency anemia. Hemoccults pending. Peptic ulcer disease suspected since she has been on NSAIDs chronically. H&H is low but stable.   Recommendations:  Begin clear liquids. CBC, LFTs, metabolic 7, serum amylase and lipase in a.m. Diagnostic EGD in a.m.

## 2015-05-06 NOTE — Care Management Note (Signed)
Case Management Note  Patient Details  Name: Krislyn Trim MRN: [id number] Date of Birth: 10-Nov-1921  Expected Discharge Date:  05/07/15               Expected Discharge Plan:  [place]  In-House Referral:  NA  Discharge planning Services  CM Consult  Post Acute Care Choice:    Choice offered to:     DME Arranged:  [facility] rolling DME Agency:  [place]:    [facility] [place] Agency:     Status of Service:  In process, will continue to follow  Medicare Important Message Given:    Date Medicare IM Given:    Medicare IM give by:    Date Additional Medicare IM Given:    Additional Medicare Important Message give by:     If discussed at [place] of Stay Meetings, dates discussed:    Additional Comments: Pt is eligible for RW through medicare. Romualdo Bolk of [facility], per pt's choice, made aware of referral and will obtain pt info from chart and RW will be delivered to pt while in hospital. PT eval pending.  Sherald Barge, RN 05/06/2015, 2:57 PM

## 2015-05-06 NOTE — Progress Notes (Signed)
[facility] PROGRESS NOTE  Jo Hardin JSE:[id number] DOB: 04/22/1922    PCP:   No primary care provider on file.   HPI:  Jo Hardin is an 81 y.o. female with hx of hypothyrodism, breast cancer s/p right mastectomy, admitted for acute pancreatitis. She has an iron deficiency anemia, and did have a 2 grams Hb drop, but remained stable. Dr Laural Golden was consulted, who recommended IV antibiotics, along with MRCP, with intention of doing ERCP if she has choledocholithiasis, otherwise, he will do EGD as she has been on NSAIDS. She is stable and her epigastric pain has improved.  MRCP showed no definite stone.  She will get EGD tomorrow.    Rewiew of Systems:  Constitutional: Negative for malaise, fever and chills. No significant weight loss or weight gain Eyes: Negative for eye pain, redness and discharge, diplopia, visual changes, or flashes of light. ENMT: Negative for ear pain, hoarseness, nasal congestion, sinus pressure and sore throat. No headaches; tinnitus, drooling, or problem swallowing. Cardiovascular: Negative for chest pain, palpitations, diaphoresis, dyspnea and peripheral edema. ; No orthopnea, PND Respiratory: Negative for cough, hemoptysis, wheezing and stridor. No pleuritic chestpain. Gastrointestinal: Negative for nausea, vomiting, diarrhea, constipation, abdominal pain, melena, blood in stool, hematemesis, jaundice and rectal bleeding.    Genitourinary: Negative for frequency, dysuria, incontinence,flank pain and hematuria; Musculoskeletal: Negative for back pain and neck pain. Negative for swelling and trauma.;  Skin: . Negative for pruritus, rash, abrasions, bruising and skin lesion.; ulcerations Neuro: Negative for headache, lightheadedness and neck stiffness. Negative for weakness, altered level of consciousness , altered mental status, extremity weakness, burning feet, involuntary movement, seizure and syncope.  Psych: negative for anxiety, depression, insomnia,  tearfulness, panic attacks, hallucinations, paranoia, suicidal or homicidal ideation    Past Medical History  Diagnosis Date  . Arthritis   . Thyroid disease   . Cancer     had breast cancer 8 yrs ago    Past Surgical History  Procedure Laterality Date  . Breast surgery      Medications:  HOME MEDS: Prior to Admission medications   Medication Sig Start Date End Date Taking? Authorizing Provider  docusate sodium (COLACE) 100 MG capsule Take 100 mg by mouth every morning.   Yes Historical Provider, MD  fluticasone (FLONASE) 50 MCG/ACT nasal spray Place 1 spray into the nose 2 (two) times daily.   Yes Historical Provider, MD  levothyroxine (SYNTHROID, LEVOTHROID) 125 MCG tablet Take 125 mcg by mouth daily.   Yes Historical Provider, MD  losartan (COZAAR) 100 MG tablet Take 100 mg by mouth daily.   Yes Historical Provider, MD  meloxicam (MOBIC) 7.5 MG tablet Take 7.5 mg by mouth daily.   Yes Historical Provider, MD  Olopatadine HCl (PATADAY) 0.2 % SOLN Place 1 drop into both eyes daily.   Yes Historical Provider, MD  omeprazole (PRILOSEC) 20 MG capsule Take 20 mg by mouth daily.   Yes Historical Provider, MD  potassium chloride (K-DUR) 10 MEQ tablet Take 10 mEq by mouth 2 (two) times daily.   Yes Historical Provider, MD  simvastatin (ZOCOR) 10 MG tablet Take 10 mg by mouth at bedtime.   Yes Historical Provider, MD  traMADol (ULTRAM) 50 MG tablet Take 50 mg by mouth 2 (two) times daily.   Yes Historical Provider, MD  Vitamin D, Ergocalciferol, (DRISDOL) 50000 UNITS CAPS Take 50,000 Units by mouth every Tuesday.    Yes Historical Provider, MD     Allergies:  Allergies  Allergen Reactions  . Codeine  Nausea And Vomiting    Social History:   reports that she has never smoked. She does not have any smokeless tobacco history on file. She reports that she does not drink alcohol or use illicit drugs.  Family History: History reviewed. No pertinent family history.   Physical  Exam: Filed Vitals:   05/05/15 0558 05/05/15 1621 05/05/15 2119 05/06/15 0508  BP: 138/69 155/74 139/71 174/74  Pulse: 77 80 81 77  Temp: 98.7 F (37.1 C) 98.6 F (37 C) 99.6 F (37.6 C) 98.7 F (37.1 C)  TempSrc: Oral   Oral  Resp: 18 20 18 18   Height:      Weight:      SpO2: 97% 95% 93% 93%   Blood pressure 174/74, pulse 77, temperature 98.7 F (37.1 C), temperature source Oral, resp. rate 18, height 4\' 11"  (1.499 m), weight 58.3 kg (128 lb 8.5 oz), SpO2 93 %.  GEN:  Pleasant  patient lying in the stretcher in no acute distress; cooperative with exam. PSYCH:  alert and oriented x4; does not appear anxious or depressed; affect is appropriate. HEENT: Mucous membranes pink and anicteric; PERRLA; EOM intact; no cervical lymphadenopathy nor thyromegaly or carotid bruit; no JVD; There were no stridor. Neck is very supple. Breasts:: Not examined CHEST WALL: No tenderness CHEST: Normal respiration, clear to auscultation bilaterally.  HEART: Regular rate and rhythm.  There are no murmur, rub, or gallops.   BACK: No kyphosis or scoliosis; no CVA tenderness ABDOMEN: soft and non-tender; no masses, no organomegaly, normal abdominal bowel sounds; no pannus; no intertriginous candida. There is no rebound and no distention. Rectal Exam: Not done EXTREMITIES: No bone or joint deformity; age-appropriate arthropathy of the hands and knees; no edema; no ulcerations.  There is no calf tenderness. Genitalia: not examined PULSES: 2+ and symmetric SKIN: Normal hydration no rash or ulceration CNS: Cranial nerves 2-12 grossly intact no focal lateralizing neurologic deficit.  Speech is fluent; uvula elevated with phonation, facial symmetry and tongue midline. DTR are normal bilaterally, cerebella exam is intact, barbinski is negative and strengths are equaled bilaterally.  No sensory loss.   Labs on Admission:  Basic Metabolic Panel:  Recent Labs Lab 05/03/15 1440 05/04/15 0550 05/05/15 0544  NA  138 136 136  K 4.0 4.0 3.5  CL 104 104 106  CO2 23 25 24   GLUCOSE 105* 89 86  BUN 19 20 22*  CREATININE 1.19* 1.02* 0.87  CALCIUM 8.8* 7.6* 7.6*   Liver Function Tests:  Recent Labs Lab 05/03/15 1440 05/05/15 0544  AST 112* 27  ALT 67* 30  ALKPHOS 95 78  BILITOT 1.0 0.5  PROT 7.1 5.4*  ALBUMIN 3.7 2.7*    Recent Labs Lab 05/03/15 1440 05/03/15 1820 05/05/15 0544  LIPASE 1757* 1645* 649*   No results for input(s): AMMONIA in the last 168 hours. CBC:  Recent Labs Lab 05/03/15 1440 05/04/15 0550 05/04/15 1643 05/05/15 0114 05/05/15 0805 05/06/15 0510  WBC 12.1* 11.7* 13.6* 12.0* 14.4* 11.4*  NEUTROABS 11.0*  --   --   --   --   --   HGB 11.7* 9.6* 10.1* 9.0* 9.4* 9.8*  HCT 35.7* 28.9* 30.9* 26.9* 27.8* 29.1*  MCV 86.7 86.5 87.0 85.9 86.3 86.6  PLT 223 159 156 147* 138* 132*   Radiological Exams on Admission: Mr 3d Recon At Scanner  05/06/2015   CLINICAL DATA:  81 year old female with upper abdominal pain and nausea and vomiting for the past 3 days. History of breast  cancer.  EXAM: MRI ABDOMEN WITHOUT AND WITH CONTRAST (INCLUDING MRCP)  TECHNIQUE: Multiplanar multisequence MR imaging of the abdomen was performed both before and after the administration of intravenous contrast. Heavily T2-weighted images of the biliary and pancreatic ducts were obtained, and three-dimensional MRCP images were rendered by post processing.  CONTRAST:  35mL MULTIHANCE GADOBENATE DIMEGLUMINE 529 MG/ML IV SOLN  COMPARISON:  CT the abdomen and pelvis 05/03/2015.  FINDINGS: Comment: Study is limited by considerable patient respiratory motion.  Lower chest: Dependent increased signal intensity in the lower lobes of the lungs bilaterally is nonspecific, but likely to reflect areas of atelectasis. Cardiomegaly.  Hepatobiliary: MRCP images are severely limited by patient respiratory motion. With these limitations in mind, there does appear to be a focal narrowing of the proximal common hepatic duct,  best appreciated on image 46 of series 13, and image 1 of series 101. On axial images, this region is poorly depicted, and simply appears decompressed, without overt intrinsic or extrinsic mass causing obstruction. There is very minimal intrahepatic biliary ductal dilatation. In segment 5 of the liver there is a more focal T2 signal intensity which predominantly appears very ductal in the central aspect of the liver, suggesting some focal periportal edema. In the more peripheral aspect of segment 5 there appears to be some mild peripheral intrahepatic biliary ductal dilatation, with a small amount of surrounding enhancement, best appreciated on image 34 of series 5006. No discrete hepatic mass is confidently identified at this time. Gallbladder is moderately distended. No definite gallstones. No gallbladder wall thickening.  Pancreas: No pancreatic mass. No pancreatic ductal dilatation. No pancreatic or peripancreatic fluid or inflammatory changes.  Spleen: Unremarkable.  Adrenals/Urinary Tract: Bilateral kidneys and bilateral adrenal glands are normal in appearance.  Stomach/Bowel: Visualized portions are unremarkable.  Vascular/Lymphatic: No aneurysm identified in the visualized abdominal vasculature. No lymphadenopathy noted in the abdomen.  Other: Trace volume of ascites.  Musculoskeletal: No aggressive osseous lesion noted in the visualized portions of the skeleton.  IMPRESSION: 1. Unusual appearance of the liver where there appears to be some mild peripheral intrahepatic biliary ductal dilatation, most evident in segment 5, where there also appears to be some focal periportal edema/inflammation. This is poorly evaluated on today's study secondary to extensive patient respiratory motion. The possibility of a focal area of cholangitis should be considered. While a centrally obstructing lesion is not excluded, particularly in light of the narrowing of the proximal common hepatic duct, a discrete lesion is not  confidently identified on today's study. MRI imaging in this elderly individual who appears unable to adequately hold her breath for the examination is very sub optimal, and any future follow-up should be performed as a contrast-enhanced CT scan (which is less prone to motion related artifacts). At this time, clinical correlation is recommended. Should further imaging followup be clinically appropriate, repeat CT of the abdomen with and without IV gadolinium in 3-6 months could be performed to exclude the possibility of a aggressive process such as a cholangiocarcinoma, which is simply not detected on today's examination. 2. Trace volume of ascites.   Electronically Signed   By: Vinnie Langton M.D.   On: 05/06/2015 09:33   Mr Jeananne Rama W/wo Cm/mrcp  05/06/2015   CLINICAL DATA:  81 year old female with upper abdominal pain and nausea and vomiting for the past 3 days. History of breast cancer.  EXAM: MRI ABDOMEN WITHOUT AND WITH CONTRAST (INCLUDING MRCP)  TECHNIQUE: Multiplanar multisequence MR imaging of the abdomen was performed both before and after  the administration of intravenous contrast. Heavily T2-weighted images of the biliary and pancreatic ducts were obtained, and three-dimensional MRCP images were rendered by post processing.  CONTRAST:  10mL MULTIHANCE GADOBENATE DIMEGLUMINE 529 MG/ML IV SOLN  COMPARISON:  CT the abdomen and pelvis 05/03/2015.  FINDINGS: Comment: Study is limited by considerable patient respiratory motion.  Lower chest: Dependent increased signal intensity in the lower lobes of the lungs bilaterally is nonspecific, but likely to reflect areas of atelectasis. Cardiomegaly.  Hepatobiliary: MRCP images are severely limited by patient respiratory motion. With these limitations in mind, there does appear to be a focal narrowing of the proximal common hepatic duct, best appreciated on image 46 of series 13, and image 1 of series 101. On axial images, this region is poorly depicted, and simply  appears decompressed, without overt intrinsic or extrinsic mass causing obstruction. There is very minimal intrahepatic biliary ductal dilatation. In segment 5 of the liver there is a more focal T2 signal intensity which predominantly appears very ductal in the central aspect of the liver, suggesting some focal periportal edema. In the more peripheral aspect of segment 5 there appears to be some mild peripheral intrahepatic biliary ductal dilatation, with a small amount of surrounding enhancement, best appreciated on image 34 of series 5006. No discrete hepatic mass is confidently identified at this time. Gallbladder is moderately distended. No definite gallstones. No gallbladder wall thickening.  Pancreas: No pancreatic mass. No pancreatic ductal dilatation. No pancreatic or peripancreatic fluid or inflammatory changes.  Spleen: Unremarkable.  Adrenals/Urinary Tract: Bilateral kidneys and bilateral adrenal glands are normal in appearance.  Stomach/Bowel: Visualized portions are unremarkable.  Vascular/Lymphatic: No aneurysm identified in the visualized abdominal vasculature. No lymphadenopathy noted in the abdomen.  Other: Trace volume of ascites.  Musculoskeletal: No aggressive osseous lesion noted in the visualized portions of the skeleton.  IMPRESSION: 1. Unusual appearance of the liver where there appears to be some mild peripheral intrahepatic biliary ductal dilatation, most evident in segment 5, where there also appears to be some focal periportal edema/inflammation. This is poorly evaluated on today's study secondary to extensive patient respiratory motion. The possibility of a focal area of cholangitis should be considered. While a centrally obstructing lesion is not excluded, particularly in light of the narrowing of the proximal common hepatic duct, a discrete lesion is not confidently identified on today's study. MRI imaging in this elderly individual who appears unable to adequately hold her breath for  the examination is very sub optimal, and any future follow-up should be performed as a contrast-enhanced CT scan (which is less prone to motion related artifacts). At this time, clinical correlation is recommended. Should further imaging followup be clinically appropriate, repeat CT of the abdomen with and without IV gadolinium in 3-6 months could be performed to exclude the possibility of a aggressive process such as a cholangiocarcinoma, which is simply not detected on today's examination. 2. Trace volume of ascites.   Electronically Signed   By: Vinnie Langton M.D.   On: 05/06/2015 09:33   Assessment/Plan Present on Admission:  . Pancreatitis, acute Iron deficiency anemia   PLAN:  Acute pancreatitis, unclear exact etiology, and improving.  Monitoring and repeat study in 2-3 month recommended by GI, as she had a defect at jx of CHD and CBD.  Iron deficiency anemia;  She is to proceed with EGD as per GI in the morning.  She is stable.   Other plans as per orders.  Code Status: FULL CODE.    Delle Andrzejewski,  MD. [facility] Pager [phone] 7pm to 7am.  05/06/2015, 1:17 PM

## 2015-05-06 NOTE — Progress Notes (Signed)
PT Cancellation Note  Patient Details Name: Jo Hardin MRN: [id number] DOB: 01-15-1922   Cancelled Treatment:    Reason Eval/Treat Not Completed: Fatigue/lethargy limiting ability to participate.  "I've been a sick woman you know".  Pt flatly refuses to try to get out of bed today.  We will try again tomorrow.   Demetrios Isaacs L  PT 05/06/2015, 2:51 PM [phone]

## 2015-05-07 ENCOUNTER — Encounter (HOSPITAL_COMMUNITY)
Admission: EM | Disposition: A | Discharge status: Skilled Nursing Facility | Payer: Self-pay | Source: Home / Self Care | Attending: Internal Medicine

## 2015-05-07 ENCOUNTER — Encounter (HOSPITAL_COMMUNITY): Payer: Self-pay

## 2015-05-07 DIAGNOSIS — K219 Gastro-esophageal reflux disease without esophagitis: Secondary | ICD-10-CM

## 2015-05-07 DIAGNOSIS — E876 Hypokalemia: Secondary | ICD-10-CM | POA: Diagnosis not present

## 2015-05-07 DIAGNOSIS — D509 Iron deficiency anemia, unspecified: Secondary | ICD-10-CM | POA: Diagnosis present

## 2015-05-07 DIAGNOSIS — K31819 Angiodysplasia of stomach and duodenum without bleeding: Secondary | ICD-10-CM

## 2015-05-07 DIAGNOSIS — I10 Essential (primary) hypertension: Secondary | ICD-10-CM

## 2015-05-07 DIAGNOSIS — E039 Hypothyroidism, unspecified: Secondary | ICD-10-CM

## 2015-05-07 HISTORY — PX: ESOPHAGOGASTRODUODENOSCOPY: SHX5428

## 2015-05-07 LAB — BASIC METABOLIC PANEL
ANION GAP: 7 (ref 5–15)
BUN: 10 mg/dL (ref 6–20)
CHLORIDE: 103 mmol/L (ref 101–111)
CO2: 24 mmol/L (ref 22–32)
CREATININE: 0.67 mg/dL (ref 0.44–1.00)
Calcium: 7.8 mg/dL — ABNORMAL LOW (ref 8.9–10.3)
GFR calc non Af Amer: >60 mL/min (ref >60)
GLUCOSE: 99 mg/dL (ref 65–99)
Potassium: 2.7 mmol/L — CL (ref 3.5–5.1)
Sodium: 134 mmol/L — ABNORMAL LOW (ref 135–145)

## 2015-05-07 LAB — CBC
HEMATOCRIT: 30.7 % — AB (ref 36.0–46.0)
HEMOGLOBIN: 10.5 g/dL — AB (ref 12.0–15.0)
MCH: 29.2 pg (ref 26.0–34.0)
MCHC: 34.2 g/dL (ref 30.0–36.0)
MCV: 85.5 fL (ref 78.0–100.0)
Platelets: 176 10*3/uL (ref 150–400)
RBC: 3.59 MIL/uL — ABNORMAL LOW (ref 3.87–5.11)
RDW: 14.9 % (ref 11.5–15.5)
WBC: 6.7 10*3/uL (ref 4.0–10.5)

## 2015-05-07 LAB — HEPATIC FUNCTION PANEL
ALBUMIN: 2.5 g/dL — AB (ref 3.5–5.0)
ALT: 18 U/L (ref 14–54)
AST: 16 U/L (ref 15–41)
Alkaline Phosphatase: 83 U/L (ref 38–126)
BILIRUBIN TOTAL: 0.7 mg/dL (ref 0.3–1.2)
Bilirubin, Direct: 0.2 mg/dL (ref 0.1–0.5)
Indirect Bilirubin: 0.5 mg/dL (ref 0.3–0.9)
Total Protein: 6.1 g/dL — ABNORMAL LOW (ref 6.5–8.1)

## 2015-05-07 LAB — AMYLASE: Amylase: 155 U/L — ABNORMAL HIGH (ref 28–100)

## 2015-05-07 LAB — LIPASE, BLOOD: LIPASE: 84 U/L — AB (ref 22–51)

## 2015-05-07 LAB — MAGNESIUM: Magnesium: 1.4 mg/dL — ABNORMAL LOW (ref 1.7–2.4)

## 2015-05-07 SURGERY — EGD (ESOPHAGOGASTRODUODENOSCOPY)
Anesthesia: Moderate Sedation

## 2015-05-07 MED ORDER — MAGNESIUM SULFATE 4 GM/100ML IV SOLN
4.0000 g | Freq: Once | INTRAVENOUS | Status: AC
  Administered 2015-05-07: 4 g via INTRAVENOUS
  Filled 2015-05-07: qty 100

## 2015-05-07 MED ORDER — SODIUM CHLORIDE 0.9 % IV SOLN
INTRAVENOUS | Status: DC
  Administered 2015-05-07: 15:00:00 via INTRAVENOUS

## 2015-05-07 MED ORDER — MEPERIDINE HCL 50 MG/ML IJ SOLN
INTRAMUSCULAR | Status: DC | PRN
  Administered 2015-05-07: 15 mg via INTRAVENOUS
  Administered 2015-05-07: 20 mg via INTRAVENOUS

## 2015-05-07 MED ORDER — BUTAMBEN-TETRACAINE-BENZOCAINE 2-2-14 % EX AERO
INHALATION_SPRAY | CUTANEOUS | Status: DC | PRN
  Administered 2015-05-07: 2 via TOPICAL

## 2015-05-07 MED ORDER — STERILE WATER FOR IRRIGATION IR SOLN
Status: DC | PRN
Start: 2015-05-07 — End: 2015-05-07
  Administered 2015-05-07: 16:00:00

## 2015-05-07 MED ORDER — MIDAZOLAM HCL 5 MG/5ML IJ SOLN
INTRAMUSCULAR | Status: AC
  Filled 2015-05-07: qty 10

## 2015-05-07 MED ORDER — MEPERIDINE HCL 50 MG/ML IJ SOLN
INTRAMUSCULAR | Status: AC
  Filled 2015-05-07: qty 1

## 2015-05-07 MED ORDER — POTASSIUM CHLORIDE CRYS ER 20 MEQ PO TBCR
40.0000 meq | EXTENDED_RELEASE_TABLET | ORAL | Status: AC
  Administered 2015-05-07 (×2): 40 meq via ORAL
  Filled 2015-05-07: qty 2
  Filled 2015-05-07: qty 4

## 2015-05-07 MED ORDER — MIDAZOLAM HCL 5 MG/5ML IJ SOLN
INTRAMUSCULAR | Status: DC | PRN
  Administered 2015-05-07 (×2): 1 mg via INTRAVENOUS

## 2015-05-07 NOTE — Progress Notes (Signed)
CRITICAL VALUE ALERT  Critical value received:  Potassium 2.7  Date of notification:  05/07/2015  Time of notification:  0740  Critical value read back Yes  Nurse who received alert:  Vista Deck  MD notified (1st page):  Dr. Roderic Palau  Time of first page:  [phone]

## 2015-05-07 NOTE — Progress Notes (Signed)
[facility] PROGRESS NOTE  Jo Hardin MLY:[id number] DOB: 04/28/1922 DOA: 05/03/2015 PCP: No primary care provider on file.  Assessment/Plan: 1. Acute Pancreatitis.Felt to be possibly Billiary pancreatitis. GI is following. She did not have ductal dilatation on MRCP. She will likely need another study in the next 3-4 months. Currently on clear liquids. Will advance diet as tolerated. Continue Zofran for nausea. 2. Iron deficiency, anemia. Pt reports taking NSAIDs at home. Plans are for EGD later today.  3. Essential HTN. Continue antihypertensives.  4. Hypothyroidism. Continue Synthroid. 5. HLD. Continue Zocor.  6. Hypokalemia. Replete. Repeat BMET in the AM.  7. Hypomagnesemia. Replete. Check labs the AM.  8. GERD. Continue Protonix 9. Deconditioning. PT evaluation in the AM.  Code Status: Full DVT Prophylaxis SCDs Family discussion: Dicussed plan with patient and family in detail. No further concerns at this time. Disposition Plan: Anticipate discharge within 1-2 days   Consultants:  GI  Procedures:  EGD  Antibiotics:  Rocephin 9/5>>9/5  HPI/Subjective: Feeling better. Abdominal soreness. Nausea exacerbated with eating. Tolerated liquids today. BM yesterday. Denies SOB.  Objective: Filed Vitals:   05/07/15 0453  BP: 151/73  Pulse: 71  Temp: 99.4 F (37.4 C)  Resp: 18    Intake/Output Summary (Last 24 hours) at 05/07/15 0747 Last data filed at 05/07/15 0629  Gross per 24 hour  Intake 3321.25 ml  Output    725 ml  Net 2596.25 ml   Filed Weights   05/03/15 1412 05/03/15 1851  Weight: 59.875 kg (132 lb) 58.3 kg (128 lb 8.5 oz)    Exam: General:  Appears comfortable, calm. Cardiovascular: Regular rate and rhythm, no murmur, rub or gallop. No lower extremity edema. Respiratory: Clear to auscultation bilaterally, no wheezes, rales or rhonchi. Normal respiratory effort. Abdomen: soft, ntnd Musculoskeletal: Trace edema bilaterally Psychiatric: grossly  normal mood and affect, speech fluent and appropriate  Data Reviewed: Basic Metabolic Panel:  Recent Labs Lab 05/03/15 1440 05/04/15 0550 05/05/15 0544 05/07/15 0614  NA 138 136 136 134*  K 4.0 4.0 3.5 2.7*  CL 104 104 106 103  CO2 23 25 24 24   GLUCOSE 105* 89 86 99  BUN 19 20 22* 10  CREATININE 1.19* 1.02* 0.87 0.67  CALCIUM 8.8* 7.6* 7.6* 7.8*   Liver Function Tests:  Recent Labs Lab 05/03/15 1440 05/05/15 0544 05/07/15 0614  AST 112* 27 16  ALT 67* 30 18  ALKPHOS 95 78 83  BILITOT 1.0 0.5 0.7  PROT 7.1 5.4* 6.1*  ALBUMIN 3.7 2.7* 2.5*    Recent Labs Lab 05/03/15 1440 05/03/15 1820 05/05/15 0544 05/07/15 0614  LIPASE 1757* 1645* 649* 84*  AMYLASE  --   --   --  155*    CBC:  Recent Labs Lab 05/03/15 1440  05/04/15 1643 05/05/15 0114 05/05/15 0805 2015-05-28 0510 05/07/15 0614  WBC 12.1*  < > 13.6* 12.0* 14.4* 11.4* 6.7  NEUTROABS 11.0*  --   --   --   --   --   --   HGB 11.7*  < > 10.1* 9.0* 9.4* 9.8* 10.5*  HCT 35.7*  < > 30.9* 26.9* 27.8* 29.1* 30.7*  MCV 86.7  < > 87.0 85.9 86.3 86.6 85.5  PLT 223  < > 156 147* 138* 132* 176  < > = values in this interval not displayed. Cardiac Enzymes:  BNP (last 3 results)  ProBNP (last 3 results)  CBG:   Studies: Mr 3d Recon At Scanner  May 28, 2015   CLINICAL DATA:  81 year old female with upper abdominal pain and nausea and vomiting for the past 3 days. History of breast cancer.  EXAM: MRI ABDOMEN WITHOUT AND WITH CONTRAST (INCLUDING MRCP)  TECHNIQUE: Multiplanar multisequence MR imaging of the abdomen was performed both before and after the administration of intravenous contrast. Heavily T2-weighted images of the biliary and pancreatic ducts were obtained, and three-dimensional MRCP images were rendered by post processing.  CONTRAST:  52mL MULTIHANCE GADOBENATE DIMEGLUMINE 529 MG/ML IV SOLN  COMPARISON:  CT the abdomen and pelvis 05/03/2015.  FINDINGS: Comment: Study is limited by considerable patient  respiratory motion.  Lower chest: Dependent increased signal intensity in the lower lobes of the lungs bilaterally is nonspecific, but likely to reflect areas of atelectasis. Cardiomegaly.  Hepatobiliary: MRCP images are severely limited by patient respiratory motion. With these limitations in mind, there does appear to be a focal narrowing of the proximal common hepatic duct, best appreciated on image 46 of series 13, and image 1 of series 101. On axial images, this region is poorly depicted, and simply appears decompressed, without overt intrinsic or extrinsic mass causing obstruction. There is very minimal intrahepatic biliary ductal dilatation. In segment 5 of the liver there is a more focal T2 signal intensity which predominantly appears very ductal in the central aspect of the liver, suggesting some focal periportal edema. In the more peripheral aspect of segment 5 there appears to be some mild peripheral intrahepatic biliary ductal dilatation, with a small amount of surrounding enhancement, best appreciated on image 34 of series 5006. No discrete hepatic mass is confidently identified at this time. Gallbladder is moderately distended. No definite gallstones. No gallbladder wall thickening.  Pancreas: No pancreatic mass. No pancreatic ductal dilatation. No pancreatic or peripancreatic fluid or inflammatory changes.  Spleen: Unremarkable.  Adrenals/Urinary Tract: Bilateral kidneys and bilateral adrenal glands are normal in appearance.  Stomach/Bowel: Visualized portions are unremarkable.  Vascular/Lymphatic: No aneurysm identified in the visualized abdominal vasculature. No lymphadenopathy noted in the abdomen.  Other: Trace volume of ascites.  Musculoskeletal: No aggressive osseous lesion noted in the visualized portions of the skeleton.  IMPRESSION: 1. Unusual appearance of the liver where there appears to be some mild peripheral intrahepatic biliary ductal dilatation, most evident in segment 5, where there  also appears to be some focal periportal edema/inflammation. This is poorly evaluated on today's study secondary to extensive patient respiratory motion. The possibility of a focal area of cholangitis should be considered. While a centrally obstructing lesion is not excluded, particularly in light of the narrowing of the proximal common hepatic duct, a discrete lesion is not confidently identified on today's study. MRI imaging in this elderly individual who appears unable to adequately hold her breath for the examination is very sub optimal, and any future follow-up should be performed as a contrast-enhanced CT scan (which is less prone to motion related artifacts). At this time, clinical correlation is recommended. Should further imaging followup be clinically appropriate, repeat CT of the abdomen with and without IV gadolinium in 3-6 months could be performed to exclude the possibility of a aggressive process such as a cholangiocarcinoma, which is simply not detected on today's examination. 2. Trace volume of ascites.   Electronically Signed   By: Vinnie Langton M.D.   On: 05/06/2015 09:33   Mr Jeananne Rama W/wo Cm/mrcp  05/06/2015   CLINICAL DATA:  81 year old female with upper abdominal pain and nausea and vomiting for the past 3 days. History of breast cancer.  EXAM: MRI ABDOMEN WITHOUT AND WITH  CONTRAST (INCLUDING MRCP)  TECHNIQUE: Multiplanar multisequence MR imaging of the abdomen was performed both before and after the administration of intravenous contrast. Heavily T2-weighted images of the biliary and pancreatic ducts were obtained, and three-dimensional MRCP images were rendered by post processing.  CONTRAST:  66mL MULTIHANCE GADOBENATE DIMEGLUMINE 529 MG/ML IV SOLN  COMPARISON:  CT the abdomen and pelvis 05/03/2015.  FINDINGS: Comment: Study is limited by considerable patient respiratory motion.  Lower chest: Dependent increased signal intensity in the lower lobes of the lungs bilaterally is nonspecific, but  likely to reflect areas of atelectasis. Cardiomegaly.  Hepatobiliary: MRCP images are severely limited by patient respiratory motion. With these limitations in mind, there does appear to be a focal narrowing of the proximal common hepatic duct, best appreciated on image 46 of series 13, and image 1 of series 101. On axial images, this region is poorly depicted, and simply appears decompressed, without overt intrinsic or extrinsic mass causing obstruction. There is very minimal intrahepatic biliary ductal dilatation. In segment 5 of the liver there is a more focal T2 signal intensity which predominantly appears very ductal in the central aspect of the liver, suggesting some focal periportal edema. In the more peripheral aspect of segment 5 there appears to be some mild peripheral intrahepatic biliary ductal dilatation, with a small amount of surrounding enhancement, best appreciated on image 34 of series 5006. No discrete hepatic mass is confidently identified at this time. Gallbladder is moderately distended. No definite gallstones. No gallbladder wall thickening.  Pancreas: No pancreatic mass. No pancreatic ductal dilatation. No pancreatic or peripancreatic fluid or inflammatory changes.  Spleen: Unremarkable.  Adrenals/Urinary Tract: Bilateral kidneys and bilateral adrenal glands are normal in appearance.  Stomach/Bowel: Visualized portions are unremarkable.  Vascular/Lymphatic: No aneurysm identified in the visualized abdominal vasculature. No lymphadenopathy noted in the abdomen.  Other: Trace volume of ascites.  Musculoskeletal: No aggressive osseous lesion noted in the visualized portions of the skeleton.  IMPRESSION: 1. Unusual appearance of the liver where there appears to be some mild peripheral intrahepatic biliary ductal dilatation, most evident in segment 5, where there also appears to be some focal periportal edema/inflammation. This is poorly evaluated on today's study secondary to extensive patient  respiratory motion. The possibility of a focal area of cholangitis should be considered. While a centrally obstructing lesion is not excluded, particularly in light of the narrowing of the proximal common hepatic duct, a discrete lesion is not confidently identified on today's study. MRI imaging in this elderly individual who appears unable to adequately hold her breath for the examination is very sub optimal, and any future follow-up should be performed as a contrast-enhanced CT scan (which is less prone to motion related artifacts). At this time, clinical correlation is recommended. Should further imaging followup be clinically appropriate, repeat CT of the abdomen with and without IV gadolinium in 3-6 months could be performed to exclude the possibility of a aggressive process such as a cholangiocarcinoma, which is simply not detected on today's examination. 2. Trace volume of ascites.   Electronically Signed   By: Vinnie Langton M.D.   On: 05/06/2015 09:33    Scheduled Meds: . cefTRIAXone (ROCEPHIN)  IV  1 g Intravenous Q24H  . docusate sodium  100 mg Oral q morning - 10a  . fluticasone  1 spray Each Nare BID  . levothyroxine  125 mcg Oral QAC breakfast  . losartan  100 mg Oral Daily  . pantoprazole  40 mg Oral Daily  . simvastatin  10 mg Oral QHS  . traMADol  50 mg Oral BID   Continuous Infusions: . sodium chloride 125 mL/hr at 05/07/15 0405    Active Problems:   Pancreatitis, acute    Time spent: 30 minutes  I, Tyrek Alroy Dust, acting a scribe, recorded this note contemporaneously in the presence of Dr. Kathie Dike, M.D. on 05/07/2015 at 7:48 AM  Kathie Dike, M.D. [facility] Pager [phone]. If 7PM-7AM, please contact night-coverage at [email or web address], password [email or web address] 05/07/2015, 7:47 AM  LOS: 4 days    I have reviewed the above documentation for accuracy and completeness, and I agree with the above.  Yue Flanigan

## 2015-05-07 NOTE — Op Note (Signed)
EGD PROCEDURE REPORT  PATIENT:  Jo Hardin  MR#:  [id number] Birthdate:  28-Jan-1922, 81 y.o., female Endoscopist:  Dr. Rogene Houston, MD Referred By:  Dr. Raynelle Chary, MD Procedure Date: 05/07/2015  Procedure:   EGD  Indications:  Patient is 81 year old African-American female was admitted with acute pancreatitis and she also has iron deficiency anemia and heme positive stool. Pancreatitis was felt to be biliary in origin. Imaging studies are negative for choledocholithiasis she is improving. Patient has been on meloxicam and therefore could have peptic ulcer disease. She is undergoing diagnostic EGD. Shows serum potassium was 2.7 earlier today and she has ceased by mouth potassium 40 mEq 2.            Informed Consent:  The risks, benefits, alternatives & imponderables which include, but are not limited to, bleeding, infection, perforation, drug reaction and potential missed lesion have been reviewed.  The potential for biopsy, lesion removal, esophageal dilation, etc. have also been discussed.  Questions have been answered.  All parties agreeable.  Please see history & physical in medical record for more information. Informed consent was obtained from patient's daughter Jo Hardin.  Medications:  Demerol 35 mg IV Versed 2 mg IV Cetacaine spray topically for oropharyngeal anesthesia  Description of procedure:  The endoscope was introduced through the mouth and advanced to the second portion of the duodenum without difficulty or limitations. The mucosal surfaces were surveyed very carefully during advancement of the scope and upon withdrawal.  Findings:  Esophagus: Mucosa of the esophagus was normal. GE junction was unremarkable. GEJ:  37cm Stomach:  Stomach was empty and distended very well with insufflation. Folds in the proximal stomach were normal. Examination of mucosa at gastric body was normal. Few antral and prepyloric telangiectasia noted without stigmata of bleeding.  Pyloric channel was patent. Angularis fundus and cardia were unremarkable. Duodenum:  Normal bulbar and post bulbar mucosa. Ampulla of Vater appeared to be normal.  Therapeutic/Diagnostic Maneuvers Performed:  None  Complications:  None  EBL:  None  Impression: No evidence of peptic ulcer disease. 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 Normal ampulla of Vater.  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 Need for further workup will depend on patient's clinical course when she has fully recovered from pancreatitis. She will need to be on by mouth iron at the time of discharge.  Recommendations:  Advance diet to full liquids. Amylase and lipase in a.m. Increase IV fluid to 75 mL per hour.  Jo Hardin  05/07/2015  4:01 PM  CC: Dr. No primary care provider on file. & Dr. No ref. provider found

## 2015-05-07 NOTE — Progress Notes (Signed)
PT Cancellation Note  Patient Details Name: Jo Hardin MRN: [id number] DOB: 1922/02/07   Cancelled Treatment:    Reason Eval/Treat Not Completed: Medical issues which prohibited therapy;Patient not medically ready. Chart reviewed, RN consulted. Holding PT evaluation at this time due to low potassium levels, as recommended by the APTA. Will attempt at later date/time once pt is safe and medically appropriate.     [name] C 05/07/2015, 8:36 AM  8:37 AM  [name], PT, DPT [place] License # [id number]

## 2015-05-07 NOTE — Progress Notes (Signed)
Patient ID: Jo Hardin, female   DOB: 1922-04-20, 81 y.o.   MRN: [id number] Feels better. Continues to have epigastric and LUQ tenderness. No nausea this am. Drinking apple juice. Was nauseated yesterday after eating jello. Had 2 BM this am but both were contaminated. Specimen not sent. CBC    Component Value Date/Time   WBC 6.7 05/07/2015 0614   RBC 3.59* 05/07/2015 0614   RBC 3.53* 05/04/2015 1643   HGB 10.5* 05/07/2015 0614   HCT 30.7* 05/07/2015 0614   PLT 176 05/07/2015 0614   MCV 85.5 05/07/2015 0614   MCH 29.2 05/07/2015 0614   MCHC 34.2 05/07/2015 0614   RDW 14.9 05/07/2015 0614   LYMPHSABS 0.5* 05/03/2015 1440   MONOABS 0.6 05/03/2015 1440   EOSABS 0.0 05/03/2015 1440   BASOSABS 0.0 05/03/2015 1440     Probably EGD ? tomorrow to rule out PUD. Patient takes chronic NSAIDs

## 2015-05-08 DIAGNOSIS — K851 Biliary acute pancreatitis: Secondary | ICD-10-CM

## 2015-05-08 LAB — CBC
HCT: 30.3 % — ABNORMAL LOW (ref 36.0–46.0)
HEMOGLOBIN: 10.3 g/dL — AB (ref 12.0–15.0)
MCH: 28.7 pg (ref 26.0–34.0)
MCHC: 34 g/dL (ref 30.0–36.0)
MCV: 84.4 fL (ref 78.0–100.0)
PLATELETS: 166 10*3/uL (ref 150–400)
RBC: 3.59 MIL/uL — AB (ref 3.87–5.11)
RDW: 14.9 % (ref 11.5–15.5)
WBC: 7.9 10*3/uL (ref 4.0–10.5)

## 2015-05-08 LAB — BASIC METABOLIC PANEL
Anion gap: 5 (ref 5–15)
BUN: 8 mg/dL (ref 6–20)
CO2: 24 mmol/L (ref 22–32)
CREATININE: 0.65 mg/dL (ref 0.44–1.00)
Calcium: 7.6 mg/dL — ABNORMAL LOW (ref 8.9–10.3)
Chloride: 105 mmol/L (ref 101–111)
GFR calc Af Amer: >60 mL/min (ref >60)
GFR calc non Af Amer: >60 mL/min (ref >60)
GLUCOSE: 109 mg/dL — AB (ref 65–99)
POTASSIUM: 3.2 mmol/L — AB (ref 3.5–5.1)
Sodium: 134 mmol/L — ABNORMAL LOW (ref 135–145)

## 2015-05-08 LAB — AMYLASE: AMYLASE: 127 U/L — AB (ref 28–100)

## 2015-05-08 LAB — LIPASE, BLOOD: Lipase: 58 U/L — ABNORMAL HIGH (ref 22–51)

## 2015-05-08 MED ORDER — POTASSIUM CHLORIDE CRYS ER 20 MEQ PO TBCR
40.0000 meq | EXTENDED_RELEASE_TABLET | Freq: Once | ORAL | Status: AC
  Administered 2015-05-08: 40 meq via ORAL
  Filled 2015-05-08: qty 2

## 2015-05-08 NOTE — Care Management Important Message (Signed)
Important Message  Patient Details  Name: Jo Hardin MRN: [id number] Date of Birth: 19-May-1922   Medicare Important Message Given:  Yes-second notification given    Sherald Barge, RN 05/08/2015, 2:04 PM

## 2015-05-08 NOTE — Clinical Social Work Placement (Signed)
   CLINICAL SOCIAL WORK PLACEMENT  NOTE  Date:  05/08/2015  Patient Details  Name: Jo Hardin MRN: [id number] Date of Birth: [birth year]  Clinical Social Work is seeking post-discharge placement for this patient at the [place] level of care (*CSW will initial, date and re-position this form in  chart as items are completed):  Yes   Patient/family provided with [facility] Work Department's list of facilities offering this level of care within the geographic area requested by the patient (or if unable, by the patient's family).  Yes   Patient/family informed of their freedom to choose among providers that offer the needed level of care, that participate in Medicare, Medicaid or managed care program needed by the patient, have an available bed and are willing to accept the patient.  Yes   Patient/family informed of [facility]'s ownership interest in [facility] and [facility], as well as of the fact that they are under no obligation to receive care at these facilities.  PASRR submitted to EDS on 05/08/15     PASRR number received on 05/08/15     Existing PASRR number confirmed on       FL2 transmitted to all facilities in geographic area requested by pt/family on 05/08/15     FL2 transmitted to all facilities within larger geographic area on       Patient informed that his/her managed care company has contracts with or will negotiate with certain facilities, including the following:            Patient/family informed of bed offers received.  Patient chooses bed at       Physician recommends and patient chooses bed at      Patient to be transferred to   on  .  Patient to be transferred to facility by       Patient family notified on   of transfer.  Name of family member notified:        PHYSICIAN       Additional Comment:    _______________________________________________ [name],  05/08/2015, 2:16  PM [phone]

## 2015-05-08 NOTE — Progress Notes (Signed)
  Subjective:  Patient continues to complain of generalized abdominal soreness. She states it hasn't gotten any worse. She does not have good appetite. She denies nausea or vomiting.  Objective: Blood pressure 161/73, pulse 84, temperature 99.3 F (37.4 C), temperature source Oral, resp. rate 16, height 4\' 11"  (1.499 m), weight 128 lb 8.5 oz (58.3 kg), SpO2 97 %. Patient is alert and in no acute distress. Abdomen is soft with mild generalized tenderness without rebound guarding or organomegaly. No LE edema or clubbing noted.  Labs/studies Results:   Recent Labs  05/06/15 0510 05/07/15 0614 05/08/15 0555  WBC 11.4* 6.7 7.9  HGB 9.8* 10.5* 10.3*  HCT 29.1* 30.7* 30.3*  PLT 132* 176 166    BMET   Recent Labs  05/07/15 0614 05/08/15 0555  NA 134* 134*  K 2.7* 3.2*  CL 103 105  CO2 24 24  GLUCOSE 99 109*  BUN 10 8  CREATININE 0.67 0.65  CALCIUM 7.8* 7.6*    Serum amylase 127   serum lipase 58; it was 1757 on admission  Assessment:  #1. Acute pancreatitis felt to be of biliary origin given mildly elevated transaminases on admission MR negative for cholelithiasis. Questionable defect junction of CHD and CBD but no dilation of bile duct upstream. Transaminases are normal and therefore will monitor patient. #2. Iron deficiency anemia. H&H is stable. EGD yesterday revealed mild gastric antral vascular ectasia which may or may not be the source of iron deficiency anemia. #3. Hypokalemia improving with therapy.   Recommendations:  Will advance diet to low-fat diet in a.m. Metabolic 7 and serum amylase in a.m.

## 2015-05-08 NOTE — Evaluation (Signed)
Physical Therapy Evaluation Patient Details Name: Jo Hardin MRN: [id number] DOB: [birth year] Today's Date: 05/08/2015   History of Present Illness  With a history of arthritis, thyroid disease, breast cancer status post lumpectomy, essential hypertension, hyperlipidemia. Patient presents with 48 hours of abdominal pain primarily in the epigastric region. Her pain has been worsening over the course of the 48 hours. Pain is worse with food and liquid. Slightly improved with vomiting. She has been vomiting several times over the 48 hours which is described as somewhat contents. Vomiting worse with oral food and liquid.  Pt lives with her daughter and normally ambulates with a cane independently.  Her daughter assists her with ADLs.  She has generalized DJD throughout that appears to be fairly significant particularly in her knees.  Clinical Impression  Pt was seen for evalution/tx.  It took a great deal of urging before she would agree to work with me.  She felt that her knees were too sore to allow for any gait.  She has not ambulated since admission but has been up to the [facility] with assist of aide.  She was able to tolerate very gentle therapeutic exercise to both LEs where it was found that she has bilateral knee flexion contractures.  Her knees are edematous with ROM only 30-40 degrees.  She needed max assist to transfer sit to stand with a walker and stood with knees flexed and trunk flexed over the top of the walker.  She was unable to take any steps.  She refused to sit up in a chair stating that she could not sit in a chair "like that".  She was unable to tell me the type of chair she could sit in, so she was assisted back to supine with mod assist.  We discussed her significant decrease of mobility and the possible need for SNF.  She refuses this and feels that she will be able to ambulate at home.  I am hoping that her daughter can manage her and will recommend HHPT at d/c.  Pt will need a w/c for  mobility at least for a while and apparently she has access to one through her church.    Follow Up Recommendations Home health PT    Equipment Recommendations  None recommended by PT (pt says that she can borrow a walker)    Recommendations for Other Services   none    Precautions / Restrictions Precautions Precautions: Fall Restrictions Weight Bearing Restrictions: No      Mobility  Bed Mobility Overal bed mobility: Needs Assistance Bed Mobility: Supine to Sit     Supine to sit: Min assist;HOB elevated        Transfers Overall transfer level: Needs assistance Equipment used: Rolling walker (2 wheeled) Transfers: Sit to/from Stand Sit to Stand: Max assist         General transfer comment: pt has been transferring to [facility] with assist of aide...she was able to stand with a walker with knees flexed and trunk flexed over the walker...she was only able to stand a few seconds and unable to functionally take any steps  Ambulation/Gait             General Gait Details: unable to ambulate at this time  Stairs            Wheelchair Mobility    Modified Rankin (Stroke Patients Only)       Balance Overall balance assessment: No apparent balance deficits (not formally assessed)  Pertinent Vitals/Pain Pain Assessment: No/denies pain    Home Living Family/patient expects to be discharged to:: Private residence Living Arrangements: Children Available Help at Discharge: Family;Available 24 hours/day Type of Home: House       Home Layout: One level Home Equipment: Cane - single point;Bedside commode      Prior Function Level of Independence: Needs assistance   Gait / Transfers Assistance Needed: ambulates independently with a cane, transfers independently by her report  ADL's / Homemaking Assistance Needed: daughter assists with bathing and dressing        Hand Dominance   Dominant  Hand: Right    Extremity/Trunk Assessment               Lower Extremity Assessment: RLE deficits/detail;LLE deficits/detail RLE Deficits / Details: knee is very edemetous with only about 10 degrees of motion...extension lacks 30 degrees of extension and flexes to 40 degrees. LLE Deficits / Details: knee is very edemetous with only 10 degrees of motion...lacks 30 degrees of extension and flexes to 40 degrees  Cervical / Trunk Assessment: Kyphotic  Communication   Communication: No difficulties  Cognition Arousal/Alertness: Awake/alert Behavior During Therapy: WFL for tasks assessed/performed Overall Cognitive Status: Within Functional Limits for tasks assessed                      General Comments      Exercises General Exercises - Lower Extremity Ankle Circles/Pumps: AROM;Both;10 reps;Supine Quad Sets: AROM;Both;10 reps;Supine Gluteal Sets: AROM;Both;Supine Short Arc QuadSinclair Ship;Both;10 reps;Supine Heel Slides: AAROM;Both;10 reps;Supine Hip ABduction/ADduction: AAROM;Both;10 reps;Supine      Assessment/Plan    PT Assessment Patient needs continued PT services  PT Diagnosis Difficulty walking;Generalized weakness   PT Problem List Decreased strength;Decreased range of motion;Decreased activity tolerance;Decreased mobility;Decreased knowledge of use of DME;Obesity;Pain  PT Treatment Interventions Gait training;Functional mobility training;Therapeutic exercise   PT Goals (Current goals can be found in the Care Plan section) Acute Rehab PT Goals Patient Stated Goal: none stated PT Goal Formulation: With patient Time For Goal Achievement: 05/22/15 Potential to Achieve Goals: Fair    Frequency Min 3X/week   Barriers to discharge   none known    Co-evaluation               End of Session Equipment Utilized During Treatment: Gait belt Activity Tolerance: Patient limited by fatigue;Patient limited by pain Patient left: in bed;with call bell/phone  within reach;with bed alarm set Nurse Communication: Mobility status         Time: 6283-6629 PT Time Calculation (min) (ACUTE ONLY): 32 min   Charges:   PT Evaluation $Initial PT Evaluation Tier I: 1 Procedure PT Treatments $Therapeutic Exercise: 8-22 mins   PT G CodesSable Feil  PT 05/08/2015, 11:31 AM [phone]

## 2015-05-08 NOTE — Care Management Note (Signed)
Case Management Note  Patient Details  Name: Jo Hardin MRN: [id number] Date of Birth: [birth year]  Expected Discharge Date:  05/07/15               Expected Discharge Plan:  [place]  In-House Referral:  NA  Discharge planning Services  CM Consult  Post Acute Care Choice:    Choice offered to:     DME Arranged:  Walker rolling DME Agency:  [place]:    [facility] Agency:     Status of Service:  In process, will continue to follow  Medicare Important Message Given:    Date Medicare IM Given:    Medicare IM give by:    Date Additional Medicare IM Given:    Additional Medicare Important Message give by:     If discussed at [place] of Stay Meetings, dates discussed:    Additional Comments: It was brought to CM attention by PT that walker delivered to pt was not the appropriate size and that pt would need Jr/pediatric walker as she is very short. CM spoke with Romualdo Bolk, of [facility], who has asked for smaller walker to be delivered to pt here in the hospital. The Jr walker has already been delivered.  Sherald Barge, RN 05/08/2015, 2:02 PM

## 2015-05-08 NOTE — Progress Notes (Signed)
[facility] PROGRESS NOTE  Jo Hardin LPF:[id number] DOB: 01-15-1922 DOA: 05/03/2015 PCP: No primary care provider on file.  Assessment/Plan: 1. Acute Pancreatitis. Felt to be possibly Billiary pancreatitis. GI is following. She did not have ductal dilatation on MRCP. Since she had a possible defect in her Biliary tree, she will likely need another study in the next 3-4 months. She has been advanced to full liquids. Continue advanced diet as tolerated. Patient was started on Rocephin that can be discontinued after tonight's dose. 2. Iron deficiency, anemia. Pt reports taking NSAIDs at home. EGD revealed normal ampulla of vater and few gastric antral vascular ectasia without stigmata of bleed but otherwise showed no evidence of peptic ulcer disease. Continue PPI. 3. Essential HTN. Continue antihypertensives.  4. Hypothyroidism. Will continue Synthroid. 5. HLD. Will continue Zocor.  6. Hypokalemia. Improving. 7. Hypomagnesemia. Repleted.  8. GERD. Will continue Protonix 9. Deconditioning. PT evaluation today and she appears to be agreeable for SNF placement. Will ask Social Worker to assist with placement.  Code Status: Full DVT Prophylaxis SCDs Family discussion: Dicussed plan with patient and family in detail. No further concerns at this time. Disposition Plan: Anticipate discharge within 1-2 days  Consultants:  GI  Procedures:  EGD --No evidence of peptic ulcer disease. 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 Normal ampulla of Vater.  Antibiotics:  Rocephin 9/5>>  HPI/Subjective: Feeling better today. Denies SOB. Abdominal soreness. No vomiting. Mild bowel movement this AM.  Objective: Filed Vitals:   05/08/15 0702  BP: 143/82  Pulse: 75  Temp: 99 F (37.2 C)  Resp: 15    Intake/Output Summary (Last 24 hours) at 05/08/15 0737 Last data filed at 05/08/15 0729  Gross per 24 hour  Intake 1967.08 ml  Output    700 ml  Net 1267.08 ml    Filed Weights   05/03/15 1412 05/03/15 1851  Weight: 59.875 kg (132 lb) 58.3 kg (128 lb 8.5 oz)    Exam: General:  Appears comfortable, calm. Cardiovascular: RRR, no m/r/g. No BLE edema. Respiratory: Diminished BS at bases, no w/r/r. Normal respiratory effort. Abdomen: soft, mild tenderness in LLQ, non distended Musculoskeletal: Trace BLE edema  Psychiatric: grossly normal mood and affect, speech fluent and appropriate  Data Reviewed: Basic Metabolic Panel:  Recent Labs Lab 05/03/15 1440 05/04/15 0550 05/05/15 0544 05/07/15 0614 05/08/15 0555  NA 138 136 136 134* 134*  K 4.0 4.0 3.5 2.7* 3.2*  CL 104 104 106 103 105  CO2 23 25 24 24 24   GLUCOSE 105* 89 86 99 109*  BUN 19 20 22* 10 8  CREATININE 1.19* 1.02* 0.87 0.67 0.65  CALCIUM 8.8* 7.6* 7.6* 7.8* 7.6*  MG  --   --   --  1.4*  --    Liver Function Tests:  Recent Labs Lab 05/03/15 1440 05/05/15 0544 05/07/15 0614  AST 112* 27 16  ALT 67* 30 18  ALKPHOS 95 78 83  BILITOT 1.0 0.5 0.7  PROT 7.1 5.4* 6.1*  ALBUMIN 3.7 2.7* 2.5*    Recent Labs Lab 05/03/15 1440 05/03/15 1820 05/05/15 0544 05/07/15 0614 05/08/15 0555  LIPASE 1757* 1645* 649* 84* 58*  AMYLASE  --   --   --  155* 127*    CBC:  Recent Labs Lab 05/03/15 1440  05/05/15 0114 05/05/15 0805 05/06/15 0510 05/07/15 0614 05/08/15 0555  WBC 12.1*  < > 12.0* 14.4* 11.4* 6.7 7.9  NEUTROABS 11.0*  --   --   --   --   --   --  HGB 11.7*  < > 9.0* 9.4* 9.8* 10.5* 10.3*  HCT 35.7*  < > 26.9* 27.8* 29.1* 30.7* 30.3*  MCV 86.7  < > 85.9 86.3 86.6 85.5 84.4  PLT 223  < > 147* 138* 132* 176 166  < > = values in this interval not displayed. Cardiac Enzymes:  BNP (last 3 results)  ProBNP (last 3 results)  CBG:   Studies: Mr 3d Recon At Scanner  06/02/15   CLINICAL DATA:  81 year old female with upper abdominal pain and nausea and vomiting for the past 3 days. History of breast cancer.  EXAM: MRI ABDOMEN WITHOUT AND WITH CONTRAST  (INCLUDING MRCP)  TECHNIQUE: Multiplanar multisequence MR imaging of the abdomen was performed both before and after the administration of intravenous contrast. Heavily T2-weighted images of the biliary and pancreatic ducts were obtained, and three-dimensional MRCP images were rendered by post processing.  CONTRAST:  47mL MULTIHANCE GADOBENATE DIMEGLUMINE 529 MG/ML IV SOLN  COMPARISON:  CT the abdomen and pelvis 05/03/2015.  FINDINGS: Comment: Study is limited by considerable patient respiratory motion.  Lower chest: Dependent increased signal intensity in the lower lobes of the lungs bilaterally is nonspecific, but likely to reflect areas of atelectasis. Cardiomegaly.  Hepatobiliary: MRCP images are severely limited by patient respiratory motion. With these limitations in mind, there does appear to be a focal narrowing of the proximal common hepatic duct, best appreciated on image 46 of series 13, and image 1 of series 101. On axial images, this region is poorly depicted, and simply appears decompressed, without overt intrinsic or extrinsic mass causing obstruction. There is very minimal intrahepatic biliary ductal dilatation. In segment 5 of the liver there is a more focal T2 signal intensity which predominantly appears very ductal in the central aspect of the liver, suggesting some focal periportal edema. In the more peripheral aspect of segment 5 there appears to be some mild peripheral intrahepatic biliary ductal dilatation, with a small amount of surrounding enhancement, best appreciated on image 34 of series 5006. No discrete hepatic mass is confidently identified at this time. Gallbladder is moderately distended. No definite gallstones. No gallbladder wall thickening.  Pancreas: No pancreatic mass. No pancreatic ductal dilatation. No pancreatic or peripancreatic fluid or inflammatory changes.  Spleen: Unremarkable.  Adrenals/Urinary Tract: Bilateral kidneys and bilateral adrenal glands are normal in  appearance.  Stomach/Bowel: Visualized portions are unremarkable.  Vascular/Lymphatic: No aneurysm identified in the visualized abdominal vasculature. No lymphadenopathy noted in the abdomen.  Other: Trace volume of ascites.  Musculoskeletal: No aggressive osseous lesion noted in the visualized portions of the skeleton.  IMPRESSION: 1. Unusual appearance of the liver where there appears to be some mild peripheral intrahepatic biliary ductal dilatation, most evident in segment 5, where there also appears to be some focal periportal edema/inflammation. This is poorly evaluated on today's study secondary to extensive patient respiratory motion. The possibility of a focal area of cholangitis should be considered. While a centrally obstructing lesion is not excluded, particularly in light of the narrowing of the proximal common hepatic duct, a discrete lesion is not confidently identified on today's study. MRI imaging in this elderly individual who appears unable to adequately hold her breath for the examination is very sub optimal, and any future follow-up should be performed as a contrast-enhanced CT scan (which is less prone to motion related artifacts). At this time, clinical correlation is recommended. Should further imaging followup be clinically appropriate, repeat CT of the abdomen with and without IV gadolinium in 3-6 months could  be performed to exclude the possibility of a aggressive process such as a cholangiocarcinoma, which is simply not detected on today's examination. 2. Trace volume of ascites.   Electronically Signed   By: Vinnie Langton M.D.   On: 05/06/2015 09:33   Mr Jeananne Rama W/wo Cm/mrcp  05/06/2015   CLINICAL DATA:  81 year old female with upper abdominal pain and nausea and vomiting for the past 3 days. History of breast cancer.  EXAM: MRI ABDOMEN WITHOUT AND WITH CONTRAST (INCLUDING MRCP)  TECHNIQUE: Multiplanar multisequence MR imaging of the abdomen was performed both before and after the  administration of intravenous contrast. Heavily T2-weighted images of the biliary and pancreatic ducts were obtained, and three-dimensional MRCP images were rendered by post processing.  CONTRAST:  42mL MULTIHANCE GADOBENATE DIMEGLUMINE 529 MG/ML IV SOLN  COMPARISON:  CT the abdomen and pelvis 05/03/2015.  FINDINGS: Comment: Study is limited by considerable patient respiratory motion.  Lower chest: Dependent increased signal intensity in the lower lobes of the lungs bilaterally is nonspecific, but likely to reflect areas of atelectasis. Cardiomegaly.  Hepatobiliary: MRCP images are severely limited by patient respiratory motion. With these limitations in mind, there does appear to be a focal narrowing of the proximal common hepatic duct, best appreciated on image 46 of series 13, and image 1 of series 101. On axial images, this region is poorly depicted, and simply appears decompressed, without overt intrinsic or extrinsic mass causing obstruction. There is very minimal intrahepatic biliary ductal dilatation. In segment 5 of the liver there is a more focal T2 signal intensity which predominantly appears very ductal in the central aspect of the liver, suggesting some focal periportal edema. In the more peripheral aspect of segment 5 there appears to be some mild peripheral intrahepatic biliary ductal dilatation, with a small amount of surrounding enhancement, best appreciated on image 34 of series 5006. No discrete hepatic mass is confidently identified at this time. Gallbladder is moderately distended. No definite gallstones. No gallbladder wall thickening.  Pancreas: No pancreatic mass. No pancreatic ductal dilatation. No pancreatic or peripancreatic fluid or inflammatory changes.  Spleen: Unremarkable.  Adrenals/Urinary Tract: Bilateral kidneys and bilateral adrenal glands are normal in appearance.  Stomach/Bowel: Visualized portions are unremarkable.  Vascular/Lymphatic: No aneurysm identified in the visualized  abdominal vasculature. No lymphadenopathy noted in the abdomen.  Other: Trace volume of ascites.  Musculoskeletal: No aggressive osseous lesion noted in the visualized portions of the skeleton.  IMPRESSION: 1. Unusual appearance of the liver where there appears to be some mild peripheral intrahepatic biliary ductal dilatation, most evident in segment 5, where there also appears to be some focal periportal edema/inflammation. This is poorly evaluated on today's study secondary to extensive patient respiratory motion. The possibility of a focal area of cholangitis should be considered. While a centrally obstructing lesion is not excluded, particularly in light of the narrowing of the proximal common hepatic duct, a discrete lesion is not confidently identified on today's study. MRI imaging in this elderly individual who appears unable to adequately hold her breath for the examination is very sub optimal, and any future follow-up should be performed as a contrast-enhanced CT scan (which is less prone to motion related artifacts). At this time, clinical correlation is recommended. Should further imaging followup be clinically appropriate, repeat CT of the abdomen with and without IV gadolinium in 3-6 months could be performed to exclude the possibility of a aggressive process such as a cholangiocarcinoma, which is simply not detected on today's examination. 2. Trace volume of  ascites.   Electronically Signed   By: Vinnie Langton M.D.   On: 05/06/2015 09:33    Scheduled Meds: . cefTRIAXone (ROCEPHIN)  IV  1 g Intravenous Q24H  . docusate sodium  100 mg Oral q morning - 10a  . fluticasone  1 spray Each Nare BID  . levothyroxine  125 mcg Oral QAC breakfast  . losartan  100 mg Oral Daily  . pantoprazole  40 mg Oral Daily  . simvastatin  10 mg Oral QHS  . traMADol  50 mg Oral BID   Continuous Infusions: . sodium chloride 75 mL/hr at 05/08/15 0730    Active Problems:   Pancreatitis, acute   Essential  hypertension   Hypothyroidism   GERD (gastroesophageal reflux disease)   Hyperlipidemia   Hypokalemia   Iron deficiency anemia    Time spent: 25 minutes  By signing my name below, I, Rhett Bannister attest that this documentation has been prepared under the direction and in the presence of Dr. Kathie Dike, M.D. on 05/08/2015  Kathie Dike, M.D. [facility] Pager [phone]. If 7PM-7AM, please contact night-coverage at [email or web address], password [facility] 05/08/2015, 7:37 AM  LOS: 5 days    I have reviewed the above documentation for accuracy and completeness, and I agree with the above.  Kathie Dike, MD

## 2015-05-08 NOTE — Clinical Social Work Note (Signed)
Clinical Social Work Assessment  Patient Details  Name: Jo Hardin MRN: [id number] Date of Birth: 08-Sep-1921  Date of referral:  05/08/15               Reason for consult:  Facility Placement                Permission sought to share information with:  Family Supports Permission granted to share information::     Name::     Jo Hardin, [place]::     Relationship::  daughter, niece  Sport and exercise psychologist Information:     Housing/Transportation Living arrangements for the past 2 months:  Single Family Home Source of Information:  Patient, Other (Comment Required) (niece) Patient Interpreter Needed:  None Criminal Activity/Legal Involvement Pertinent to Current Situation/Hospitalization:  No - Comment as needed Significant Relationships:  Other Family Members, Adult Children Lives with:  Adult Children Do you feel safe going back to the place where you live?  No (feels needs rehab) Need for family participation in patient care:  Yes (Comment)  Care giving concerns:  Pt lives with daughter, but is not at baseline.    Social Worker assessment / plan:  CSW met with pt and pt's niece, Jo Hardin at bedside with pt's permission. Pt alert and oriented and reports she lives with her only living child, Jo Hardin. Pt indicates that she generally manages well at home and Jo Hardin is with her almost around the clock. She is fairly independent and typically ambulates using a cane. PT evaluated pt today and would recommend SNF, but pt was initially refusing. However, she has now agreed and feels positive about this as she is hopeful it will help her mobility. Pt has never been to rehab before. SNF discussed, including Medicare coverage/criteria. She requests [facility] if possible.   Employment status:  Retired Forensic scientist:  [employer] PT Recommendations:  [place], Home with [place] / Referral to community resources:  [place]  Patient/Family's Response to care:  Pt agreeable to short term SNF.   Patient/Family's Understanding of and Emotional Response to Diagnosis, Current Treatment, and Prognosis:  Pt and family report pt has been fairly healthy. They are aware of admission diagnosis and treatment plan.   Emotional Assessment Appearance:  Appears stated age Attitude/Demeanor/Rapport:  Other (Cooperative) Affect (typically observed):  Pleasant Orientation:  Oriented to Self, Oriented to Place, Oriented to  Time, Oriented to Situation Alcohol / Substance use:  Not Applicable Psych involvement (Current and /or in the community):     Discharge Needs  Concerns to be addressed:  Discharge Planning Concerns Readmission within the last 30 days:  No Current discharge risk:  Physical Impairment Barriers to Discharge:  Continued Medical Work up   [employer], [employer], [place] 05/08/2015, 2:21 PM [phone]

## 2015-05-09 ENCOUNTER — Encounter (HOSPITAL_COMMUNITY): Payer: Self-pay | Admitting: Internal Medicine

## 2015-05-09 LAB — AMYLASE: Amylase: 140 U/L — ABNORMAL HIGH (ref 28–100)

## 2015-05-09 LAB — BASIC METABOLIC PANEL
ANION GAP: 7 (ref 5–15)
BUN: 5 mg/dL — ABNORMAL LOW (ref 6–20)
CALCIUM: 7.8 mg/dL — AB (ref 8.9–10.3)
CO2: 25 mmol/L (ref 22–32)
Chloride: 101 mmol/L (ref 101–111)
Creatinine, Ser: 0.62 mg/dL (ref 0.44–1.00)
GFR calc Af Amer: >60 mL/min (ref >60)
GFR calc non Af Amer: >60 mL/min (ref >60)
GLUCOSE: 100 mg/dL — AB (ref 65–99)
Potassium: 2.9 mmol/L — ABNORMAL LOW (ref 3.5–5.1)
Sodium: 133 mmol/L — ABNORMAL LOW (ref 135–145)

## 2015-05-09 LAB — POTASSIUM: Potassium: 3.7 mmol/L (ref 3.5–5.1)

## 2015-05-09 MED ORDER — POTASSIUM CHLORIDE CRYS ER 20 MEQ PO TBCR
40.0000 meq | EXTENDED_RELEASE_TABLET | ORAL | Status: AC
  Administered 2015-05-09 (×3): 40 meq via ORAL
  Filled 2015-05-09 (×3): qty 2

## 2015-05-09 MED ORDER — POTASSIUM CHLORIDE CRYS ER 20 MEQ PO TBCR: 40.0000 meq | EXTENDED_RELEASE_TABLET | ORAL | Status: DC

## 2015-05-09 MED ORDER — TRAMADOL HCL 50 MG PO TABS: 50.0000 mg | ORAL_TABLET | Freq: Two times a day (BID) | ORAL | Status: DC

## 2015-05-09 NOTE — Clinical Social Work Placement (Signed)
   CLINICAL SOCIAL WORK PLACEMENT  NOTE  Date:  05/09/2015  Patient Details  Name: Jo Hardin MRN: [id number] Date of Birth: May 20, 1922  Clinical Social Work is seeking post-discharge placement for this patient at the [place] level of care (*CSW will initial, date and re-position this form in  chart as items are completed):  Yes   Patient/family provided with [place] Work Department's list of facilities offering this level of care within the geographic area requested by the patient (or if unable, by the patient's family).  Yes   Patient/family informed of their freedom to choose among providers that offer the needed level of care, that participate in Medicare, Medicaid or managed care program needed by the patient, have an available bed and are willing to accept the patient.  Yes   Patient/family informed of [place]'s ownership interest in [facility] and [facility], as well as of the fact that they are under no obligation to receive care at these facilities.  PASRR submitted to EDS on 05/08/15     PASRR number received on 05/08/15     Existing PASRR number confirmed on       FL2 transmitted to all facilities in geographic area requested by pt/family on 05/08/15     FL2 transmitted to all facilities within larger geographic area on       Patient informed that his/her managed care company has contracts with or will negotiate with certain facilities, including the following:        Yes   Patient/family informed of bed offers received.  Patient chooses bed at [place] [facility]     Physician recommends and patient chooses bed at      Patient to be transferred to [facility] [place] on 05/09/15.  Patient to be transferred to facility by [facility]     Patient family notified on 05/09/15 of transfer.  Name of family member notified:  Otelia Sergeant- daughter     PHYSICIAN       Additional Comment:     _______________________________________________ [name], [place] 05/09/2015, 2:50 PM [phone]

## 2015-05-09 NOTE — Progress Notes (Signed)
NURSING PROGRESS NOTE  Jo Hardin [id number] Discharge Data: 05/09/2015 5:33 PM Attending Provider: No att. providers found PCP:No primary care provider on file.   Jo Hardin to be D/C'd Skilled nursing facility per MD order.    All IV's discontinued and monitored for bleeding.  All belongings returned to patient for patient to take home.  Discharge summary included in packet sent to SNF.  Patient left via stretcher, escorted by EMS.  Last Documented Vital Signs:  Blood pressure 176/97, pulse 94, temperature 98.8 F (37.1 C), temperature source Oral, resp. rate 20, height 4\' 11"  (1.499 m), weight 58.3 kg (128 lb 8.5 oz), SpO2 99 %.  Jo Hardin D

## 2015-05-09 NOTE — Care Management Important Message (Signed)
Important Message  Patient Details  Name: Jo Hardin MRN: [id number] Date of Birth: 1921/10/28   Medicare Important Message Given:  Yes-second notification given    Sherald Barge, RN 05/09/2015, 8:34 AM

## 2015-05-09 NOTE — Progress Notes (Signed)
Spoke with Dr.Rehman who gave orders to advance diet to heart healthy.

## 2015-05-09 NOTE — Discharge Summary (Signed)
Physician Discharge Summary  Jo Hardin WCB:[id number] DOB: 1921/11/05 DOA: 05/03/2015  PCP: No primary care provider on file.  Admit date: 05/03/2015 Discharge date: 05/09/2015  Time spent: 40 minutes  Recommendations for Outpatient Follow-up:  1. Follow up with GI in 4 weeks 2. Consider outpatient MRCP in 3-4 months to evaluate for biliary tree defect 3. Patient will be discharged to SNF for rehab  Discharge Diagnoses:  Active Problems:   Pancreatitis, acute   Essential hypertension   Hypothyroidism   GERD (gastroesophageal reflux disease)   Hyperlipidemia   Hypokalemia   Iron deficiency anemia   Discharge Condition: Improved  Diet recommendation: low fat, low salt  Filed Weights   05/03/15 1412 05/03/15 1851  Weight: 59.875 kg (132 lb) 58.3 kg (128 lb 8.5 oz)    History of present illness:  81 year old female with history of pancreatitis, essential HTN,hypothyroidism, GERD, HLD and hypokalemia  Hospital Course:  Acute Pancreatitis was felt to be possibly Billiary pancreatitis. GI is following. She did not have ductal dilatation on MRCP. Since she had a possible defect in her Biliary tree, she will likely need another study in the next 3-4 months. She is tolerating full liquids and small amounts of soft solids, continue to advanced diet as tolerated. Will discontinue rocephin. Will follow up with GI. Lipase has trended back down to normal. She is no longer having any vomiting or significant abdominal pain.  1. Iron deficiency, anemia. Pt reports taking NSAIDs at home. EGD revealed normal ampulla of vater and few gastric antral vascular ectasia without stigmata of bleed but otherwise showed no evidence of peptic ulcer disease. Continue PPI and avoid further NSAIDs. Hgb is stable.  2. Essential HTN. Continue antihypertensives.  3. Hypothyroidism. Will continue Synthroid. 4. HLD. Will continue Zocor.  5. Hypokalemia. Improved with repletion. 6. Hypomagnesemia. Replaced 7. GERD.  Will continue Protonix 8. Deconditioning. PT evaluated and felt patient would benefit from SNF placement.  Procedures: 9. EGD --No evidence of peptic ulcer disease. 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 Normal ampulla of Vater.  Consultations:  Everlean Alstrom, MD  Discharge Exam: Filed Vitals:   05/09/15 0534  BP: 159/80  Pulse: 89  Temp: 98.5 F (36.9 C)  Resp: 18   10. General: NAD, looks comfortable 11. Cardiovascular: RRR, S1, S2  12. Respiratory: clear bilaterally, No wheezing, rales or rhnochi 13. Abdomen: soft, mild diffused tenderness on palpation, no distention , bowel sounds normal 14. Musculoskeletal: Trace edema   Discharge Instructions    Current Discharge Medication List    CONTINUE these medications which have CHANGED   Details  traMADol (ULTRAM) 50 MG tablet Take 1 tablet (50 mg total) by mouth 2 (two) times daily. Qty: 30 tablet, Refills: 0      CONTINUE these medications which have NOT CHANGED   Details  docusate sodium (COLACE) 100 MG capsule Take 100 mg by mouth every morning.    fluticasone (FLONASE) 50 MCG/ACT nasal spray Place 1 spray into the nose 2 (two) times daily.    levothyroxine (SYNTHROID, LEVOTHROID) 125 MCG tablet Take 125 mcg by mouth daily.    losartan (COZAAR) 100 MG tablet Take 100 mg by mouth daily.    Olopatadine HCl (PATADAY) 0.2 % SOLN Place 1 drop into both eyes daily.    omeprazole (PRILOSEC) 20 MG capsule Take 20 mg by mouth daily.    potassium chloride (K-DUR) 10 MEQ tablet Take 10 mEq by mouth 2 (two) times daily.  simvastatin (ZOCOR) 10 MG tablet Take 10 mg by mouth at bedtime.    Vitamin D, Ergocalciferol, (DRISDOL) 50000 UNITS CAPS Take 50,000 Units by mouth every Tuesday.       STOP taking these medications     meloxicam (MOBIC) 7.5 MG tablet        Allergies  Allergen Reactions  . Codeine Nausea And Vomiting      The results of significant diagnostics from this  hospitalization (including imaging, microbiology, ancillary and laboratory) are listed below for reference.    Significant Diagnostic Studies: Ct Abdomen Pelvis W Contrast  05/03/2015   CLINICAL DATA:  Abdominal pain and vomiting. Diarrhea. History breast cancer 8 years ago.  EXAM: CT ABDOMEN AND PELVIS WITH CONTRAST  TECHNIQUE: Multidetector CT imaging of the abdomen and pelvis was performed using the standard protocol following bolus administration of intravenous contrast.  CONTRAST:  48mL OMNIPAQUE IOHEXOL 300 MG/ML SOLN, 45mL OMNIPAQUE IOHEXOL 300 MG/ML SOLN  COMPARISON:  None.  FINDINGS: Lower chest: Cardiac enlargement. Pulmonary artery enlargement. Mild atelectasis in the lung bases. No effusion or pneumonia.  Hepatobiliary: Linear hypodensity in the right lobe liver may represent Mild biliary dilatation. Central bile ducts mildly prominent however common bile duct is not dilated measuring 6 mm. Gallbladder wall shows hyper enhancement without significant thickening. There is fluid around the gallbladder which may be related to ascites. Small amount of ascites is also noted in the infrahepatic region. No liver mass lesion.  Pancreas: Mild peripancreatic edema raises the possibility of pancreatitis. No pancreatic mass or calcification. There is mild edema extending into the mesentery.  Spleen: Negative  Adrenals/Urinary Tract: Normal kidneys. No renal mass or obstruction. No renal calculi. Urinary bladder normal.  Stomach/Bowel: Negative for bowel obstruction. No significant bowel edema. Normal appendix.  Vascular/Lymphatic: Atherosclerotic aorta with tortuosity but no aneurysm.  Reproductive: Uterus not enlarged. Small uterine calcifications compatible with chronic fibroid.  Other: No free fluid in the pelvis. Mild amount of ascites around the liver and gallbladder. No adenopathy.  Musculoskeletal: Extensive disc degeneration and spondylosis diffusely. Negative for acute fracture. Schmorl's node inferior  endplate of H47  IMPRESSION: Mild ascites in the upper abdomen around the gallbladder and liver.  Mild peripancreatic edema extending into the mesenteric, possibly due to pancreatitis.  Central bile ducts mildly prominent however common bile duct is normal at 6 mm. Linear branching hypodensity in the right lobe liver may represent a mildly dilated bile duct.   Electronically Signed   By: Franchot Gallo M.D.   On: 05/03/2015 16:26   Mr 3d Recon At Scanner  05/06/2015   CLINICAL DATA:  81 year old female with upper abdominal pain and nausea and vomiting for the past 3 days. History of breast cancer.  EXAM: MRI ABDOMEN WITHOUT AND WITH CONTRAST (INCLUDING MRCP)  TECHNIQUE: Multiplanar multisequence MR imaging of the abdomen was performed both before and after the administration of intravenous contrast. Heavily T2-weighted images of the biliary and pancreatic ducts were obtained, and three-dimensional MRCP images were rendered by post processing.  CONTRAST:  34mL MULTIHANCE GADOBENATE DIMEGLUMINE 529 MG/ML IV SOLN  COMPARISON:  CT the abdomen and pelvis 05/03/2015.  FINDINGS: Comment: Study is limited by considerable patient respiratory motion.  Lower chest: Dependent increased signal intensity in the lower lobes of the lungs bilaterally is nonspecific, but likely to reflect areas of atelectasis. Cardiomegaly.  Hepatobiliary: MRCP images are severely limited by patient respiratory motion. With these limitations in mind, there does appear to be a focal narrowing of the  proximal common hepatic duct, best appreciated on image 46 of series 13, and image 1 of series 101. On axial images, this region is poorly depicted, and simply appears decompressed, without overt intrinsic or extrinsic mass causing obstruction. There is very minimal intrahepatic biliary ductal dilatation. In segment 5 of the liver there is a more focal T2 signal intensity which predominantly appears very ductal in the central aspect of the liver,  suggesting some focal periportal edema. In the more peripheral aspect of segment 5 there appears to be some mild peripheral intrahepatic biliary ductal dilatation, with a small amount of surrounding enhancement, best appreciated on image 34 of series 5006. No discrete hepatic mass is confidently identified at this time. Gallbladder is moderately distended. No definite gallstones. No gallbladder wall thickening.  Pancreas: No pancreatic mass. No pancreatic ductal dilatation. No pancreatic or peripancreatic fluid or inflammatory changes.  Spleen: Unremarkable.  Adrenals/Urinary Tract: Bilateral kidneys and bilateral adrenal glands are normal in appearance.  Stomach/Bowel: Visualized portions are unremarkable.  Vascular/Lymphatic: No aneurysm identified in the visualized abdominal vasculature. No lymphadenopathy noted in the abdomen.  Other: Trace volume of ascites.  Musculoskeletal: No aggressive osseous lesion noted in the visualized portions of the skeleton.  IMPRESSION: 1. Unusual appearance of the liver where there appears to be some mild peripheral intrahepatic biliary ductal dilatation, most evident in segment 5, where there also appears to be some focal periportal edema/inflammation. This is poorly evaluated on today's study secondary to extensive patient respiratory motion. The possibility of a focal area of cholangitis should be considered. While a centrally obstructing lesion is not excluded, particularly in light of the narrowing of the proximal common hepatic duct, a discrete lesion is not confidently identified on today's study. MRI imaging in this elderly individual who appears unable to adequately hold her breath for the examination is very sub optimal, and any future follow-up should be performed as a contrast-enhanced CT scan (which is less prone to motion related artifacts). At this time, clinical correlation is recommended. Should further imaging followup be clinically appropriate, repeat CT of the  abdomen with and without IV gadolinium in 3-6 months could be performed to exclude the possibility of a aggressive process such as a cholangiocarcinoma, which is simply not detected on today's examination. 2. Trace volume of ascites.   Electronically Signed   By: Vinnie Langton M.D.   On: 05/06/2015 09:33   Mr Jeananne Rama W/wo Cm/mrcp  05/06/2015   CLINICAL DATA:  81 year old female with upper abdominal pain and nausea and vomiting for the past 3 days. History of breast cancer.  EXAM: MRI ABDOMEN WITHOUT AND WITH CONTRAST (INCLUDING MRCP)  TECHNIQUE: Multiplanar multisequence MR imaging of the abdomen was performed both before and after the administration of intravenous contrast. Heavily T2-weighted images of the biliary and pancreatic ducts were obtained, and three-dimensional MRCP images were rendered by post processing.  CONTRAST:  21mL MULTIHANCE GADOBENATE DIMEGLUMINE 529 MG/ML IV SOLN  COMPARISON:  CT the abdomen and pelvis 05/03/2015.  FINDINGS: Comment: Study is limited by considerable patient respiratory motion.  Lower chest: Dependent increased signal intensity in the lower lobes of the lungs bilaterally is nonspecific, but likely to reflect areas of atelectasis. Cardiomegaly.  Hepatobiliary: MRCP images are severely limited by patient respiratory motion. With these limitations in mind, there does appear to be a focal narrowing of the proximal common hepatic duct, best appreciated on image 46 of series 13, and image 1 of series 101. On axial images, this region is poorly depicted,  and simply appears decompressed, without overt intrinsic or extrinsic mass causing obstruction. There is very minimal intrahepatic biliary ductal dilatation. In segment 5 of the liver there is a more focal T2 signal intensity which predominantly appears very ductal in the central aspect of the liver, suggesting some focal periportal edema. In the more peripheral aspect of segment 5 there appears to be some mild peripheral  intrahepatic biliary ductal dilatation, with a small amount of surrounding enhancement, best appreciated on image 34 of series 5006. No discrete hepatic mass is confidently identified at this time. Gallbladder is moderately distended. No definite gallstones. No gallbladder wall thickening.  Pancreas: No pancreatic mass. No pancreatic ductal dilatation. No pancreatic or peripancreatic fluid or inflammatory changes.  Spleen: Unremarkable.  Adrenals/Urinary Tract: Bilateral kidneys and bilateral adrenal glands are normal in appearance.  Stomach/Bowel: Visualized portions are unremarkable.  Vascular/Lymphatic: No aneurysm identified in the visualized abdominal vasculature. No lymphadenopathy noted in the abdomen.  Other: Trace volume of ascites.  Musculoskeletal: No aggressive osseous lesion noted in the visualized portions of the skeleton.  IMPRESSION: 1. Unusual appearance of the liver where there appears to be some mild peripheral intrahepatic biliary ductal dilatation, most evident in segment 5, where there also appears to be some focal periportal edema/inflammation. This is poorly evaluated on today's study secondary to extensive patient respiratory motion. The possibility of a focal area of cholangitis should be considered. While a centrally obstructing lesion is not excluded, particularly in light of the narrowing of the proximal common hepatic duct, a discrete lesion is not confidently identified on today's study. MRI imaging in this elderly individual who appears unable to adequately hold her breath for the examination is very sub optimal, and any future follow-up should be performed as a contrast-enhanced CT scan (which is less prone to motion related artifacts). At this time, clinical correlation is recommended. Should further imaging followup be clinically appropriate, repeat CT of the abdomen with and without IV gadolinium in 3-6 months could be performed to exclude the possibility of a aggressive process  such as a cholangiocarcinoma, which is simply not detected on today's examination. 2. Trace volume of ascites.   Electronically Signed   By: Vinnie Langton M.D.   On: 05/06/2015 09:33    Microbiology: No results found for this or any previous visit (from the past 240 hour(s)).   Labs: Basic Metabolic Panel:  Recent Labs Lab 05/04/15 0550 05/05/15 0544 05/07/15 0614 05/08/15 0555 05/09/15 0625  NA 136 136 134* 134* 133*  K 4.0 3.5 2.7* 3.2* 2.9*  CL 104 106 103 105 101  CO2 25 24 24 24 25   GLUCOSE 89 86 99 109* 100*  BUN 20 22* 10 8 5*  CREATININE 1.02* 0.87 0.67 0.65 0.62  CALCIUM 7.6* 7.6* 7.8* 7.6* 7.8*  MG  --   --  1.4*  --   --    Liver Function Tests:  Recent Labs Lab 05/03/15 1440 05/05/15 0544 05/07/15 0614  AST 112* 27 16  ALT 67* 30 18  ALKPHOS 95 78 83  BILITOT 1.0 0.5 0.7  PROT 7.1 5.4* 6.1*  ALBUMIN 3.7 2.7* 2.5*    Recent Labs Lab 05/03/15 1440 05/03/15 1820 05/05/15 0544 05/07/15 0614 05/08/15 0555 05/09/15 0625  LIPASE 1757* 1645* 649* 84* 58*  --   AMYLASE  --   --   --  155* 127* 140*   No results for input(s): AMMONIA in the last 168 hours. CBC:  Recent Labs Lab 05/03/15 1440  05/05/15 0114 05/05/15 0805 05/06/15 0510 05/07/15 0614 05/08/15 0555  WBC 12.1*  < > 12.0* 14.4* 11.4* 6.7 7.9  NEUTROABS 11.0*  --   --   --   --   --   --   HGB 11.7*  < > 9.0* 9.4* 9.8* 10.5* 10.3*  HCT 35.7*  < > 26.9* 27.8* 29.1* 30.7* 30.3*  MCV 86.7  < > 85.9 86.3 86.6 85.5 84.4  PLT 223  < > 147* 138* 132* 176 166  < > = values in this interval not displayed. Cardiac Enzymes:   Signed:  Kathie Dike, MD  [facility] 05/09/2015, 3:34 PM    By signing my name below, I, Rennis Harding, attest that this documentation has been prepared under the direction and in the presence of Kathie Dike, MD. Electronically signed: Rennis Harding, Scribe.    I have reviewed the above documentation for accuracy and completeness, and I  agree with the above.  Jo Hardin

## 2015-05-09 NOTE — Clinical Social Work Note (Signed)
D/C summary faxed. RN aware to call EMS when ready for transport to [facility].  Benay Pike, [place]

## 2015-05-09 NOTE — Care Management Note (Signed)
Case Management Note  Patient Details  Name: Jo Hardin MRN: [id number] Date of Birth: 20-Nov-1921   Expected Discharge Date:  05/07/15               Expected Discharge Plan:  [place]  In-House Referral:  Clinical Social Work  Discharge planning Services  CM Consult  Post Acute Care Choice:  NA Choice offered to:  NA  DME Arranged:  [facility] Rile rolling DME Agency:  [place]:    [place] Agency:     Status of Service:  Completed, signed off  Medicare Important Message Given:  Yes-second notification given Date Medicare IM Given:    Medicare IM give by:    Date Additional Medicare IM Given:    Additional Medicare Important Message give by:     If discussed at [place] of Stay Meetings, dates discussed:    Additional Comments: PT recommended SNF and pt initially refused but now agreeable to go for short time. CSW is aware of DC plan and will work with pt and daughter to find placement. No further CM needs anticipated.  Sherald Barge, RN 05/09/2015, 8:35 AM

## 2015-05-18 ENCOUNTER — Encounter (HOSPITAL_COMMUNITY): Payer: Self-pay | Admitting: Emergency Medicine

## 2015-05-18 ENCOUNTER — Observation Stay (HOSPITAL_COMMUNITY): Payer: Medicare Other

## 2015-05-18 ENCOUNTER — Emergency Department (HOSPITAL_COMMUNITY): Payer: Medicare Other

## 2015-05-18 ENCOUNTER — Inpatient Hospital Stay (HOSPITAL_COMMUNITY)
Admission: EM | Admit: 2015-05-18 | Discharge: 2015-05-22 | DRG: 193 | Disposition: A | Discharge status: Skilled Nursing Facility | Payer: Medicare Other | Attending: Internal Medicine | Admitting: Internal Medicine

## 2015-05-18 DIAGNOSIS — I10 Essential (primary) hypertension: Secondary | ICD-10-CM | POA: Diagnosis present

## 2015-05-18 DIAGNOSIS — R059 Cough, unspecified: Secondary | ICD-10-CM

## 2015-05-18 DIAGNOSIS — E876 Hypokalemia: Secondary | ICD-10-CM | POA: Diagnosis present

## 2015-05-18 DIAGNOSIS — Z9011 Acquired absence of right breast and nipple: Secondary | ICD-10-CM | POA: Diagnosis present

## 2015-05-18 DIAGNOSIS — J323 Chronic sphenoidal sinusitis: Secondary | ICD-10-CM | POA: Diagnosis present

## 2015-05-18 DIAGNOSIS — I9589 Other hypotension: Secondary | ICD-10-CM | POA: Diagnosis not present

## 2015-05-18 DIAGNOSIS — D509 Iron deficiency anemia, unspecified: Secondary | ICD-10-CM | POA: Diagnosis present

## 2015-05-18 DIAGNOSIS — N179 Acute kidney failure, unspecified: Secondary | ICD-10-CM | POA: Diagnosis present

## 2015-05-18 DIAGNOSIS — R404 Transient alteration of awareness: Secondary | ICD-10-CM | POA: Diagnosis present

## 2015-05-18 DIAGNOSIS — J189 Pneumonia, unspecified organism: Principal | ICD-10-CM | POA: Diagnosis present

## 2015-05-18 DIAGNOSIS — G9340 Encephalopathy, unspecified: Secondary | ICD-10-CM | POA: Diagnosis present

## 2015-05-18 DIAGNOSIS — Y95 Nosocomial condition: Secondary | ICD-10-CM | POA: Diagnosis present

## 2015-05-18 DIAGNOSIS — K219 Gastro-esophageal reflux disease without esophagitis: Secondary | ICD-10-CM | POA: Diagnosis present

## 2015-05-18 DIAGNOSIS — R001 Bradycardia, unspecified: Secondary | ICD-10-CM | POA: Diagnosis present

## 2015-05-18 DIAGNOSIS — R05 Cough: Secondary | ICD-10-CM | POA: Diagnosis present

## 2015-05-18 DIAGNOSIS — Z853 Personal history of malignant neoplasm of breast: Secondary | ICD-10-CM

## 2015-05-18 DIAGNOSIS — E039 Hypothyroidism, unspecified: Secondary | ICD-10-CM | POA: Diagnosis present

## 2015-05-18 DIAGNOSIS — E785 Hyperlipidemia, unspecified: Secondary | ICD-10-CM | POA: Diagnosis present

## 2015-05-18 DIAGNOSIS — R609 Edema, unspecified: Secondary | ICD-10-CM

## 2015-05-18 DIAGNOSIS — E871 Hypo-osmolality and hyponatremia: Secondary | ICD-10-CM | POA: Diagnosis present

## 2015-05-18 DIAGNOSIS — I959 Hypotension, unspecified: Secondary | ICD-10-CM | POA: Diagnosis present

## 2015-05-18 DIAGNOSIS — R4189 Other symptoms and signs involving cognitive functions and awareness: Secondary | ICD-10-CM | POA: Diagnosis present

## 2015-05-18 DIAGNOSIS — R0600 Dyspnea, unspecified: Secondary | ICD-10-CM | POA: Diagnosis not present

## 2015-05-18 HISTORY — DX: Anemia, unspecified: D64.9

## 2015-05-18 HISTORY — DX: Unspecified osteoarthritis, unspecified site: M19.90

## 2015-05-18 HISTORY — DX: Acute pancreatitis without necrosis or infection, unspecified: K85.90

## 2015-05-18 HISTORY — DX: Hypokalemia: E87.6

## 2015-05-18 HISTORY — DX: Essential (primary) hypertension: I10

## 2015-05-18 HISTORY — DX: Angiodysplasia of stomach and duodenum without bleeding: K31.819

## 2015-05-18 HISTORY — DX: Hypomagnesemia: E83.42

## 2015-05-18 HISTORY — DX: Gastro-esophageal reflux disease without esophagitis: K21.9

## 2015-05-18 HISTORY — DX: Pneumonia, unspecified organism: J18.9

## 2015-05-18 HISTORY — DX: Bradycardia, unspecified: R00.1

## 2015-05-18 LAB — CBC WITH DIFFERENTIAL/PLATELET
Basophils Absolute: 0.1 10*3/uL (ref 0.0–0.1)
Basophils Relative: 1 %
EOS ABS: 0 10*3/uL (ref 0.0–0.7)
Eosinophils Relative: 0 %
HEMATOCRIT: 28.8 % — AB (ref 36.0–46.0)
HEMOGLOBIN: 9.6 g/dL — AB (ref 12.0–15.0)
LYMPHS ABS: 1.2 10*3/uL (ref 0.7–4.0)
Lymphocytes Relative: 20 %
MCH: 28.5 pg (ref 26.0–34.0)
MCHC: 33.3 g/dL (ref 30.0–36.0)
MCV: 85.5 fL (ref 78.0–100.0)
MONOS PCT: 12 %
Monocytes Absolute: 0.8 10*3/uL (ref 0.1–1.0)
NEUTROS ABS: 4.2 10*3/uL (ref 1.7–7.7)
NEUTROS PCT: 67 %
Platelets: 420 10*3/uL — ABNORMAL HIGH (ref 150–400)
RBC: 3.37 MIL/uL — AB (ref 3.87–5.11)
RDW: 15.8 % — ABNORMAL HIGH (ref 11.5–15.5)
WBC: 6.3 10*3/uL (ref 4.0–10.5)

## 2015-05-18 LAB — URINALYSIS, ROUTINE W REFLEX MICROSCOPIC
Bilirubin Urine: NEGATIVE
GLUCOSE, UA: NEGATIVE mg/dL
Ketones, ur: NEGATIVE mg/dL
LEUKOCYTES UA: NEGATIVE
Nitrite: NEGATIVE
PH: 7.5 (ref 5.0–8.0)
Protein, ur: NEGATIVE mg/dL
Specific Gravity, Urine: 1.01 (ref 1.005–1.030)
Urobilinogen, UA: 0.2 mg/dL (ref 0.0–1.0)

## 2015-05-18 LAB — TROPONIN I

## 2015-05-18 LAB — TSH: TSH: 11.478 u[IU]/mL — ABNORMAL HIGH (ref 0.350–4.500)

## 2015-05-18 LAB — I-STAT CG4 LACTIC ACID, ED: LACTIC ACID, VENOUS: 1.58 mmol/L (ref 0.5–2.0)

## 2015-05-18 LAB — COMPREHENSIVE METABOLIC PANEL
ALT: 8 U/L — AB (ref 14–54)
ANION GAP: 7 (ref 5–15)
AST: 16 U/L (ref 15–41)
Albumin: 2.6 g/dL — ABNORMAL LOW (ref 3.5–5.0)
Alkaline Phosphatase: 51 U/L (ref 38–126)
BUN: 11 mg/dL (ref 6–20)
CHLORIDE: 102 mmol/L (ref 101–111)
CO2: 25 mmol/L (ref 22–32)
CREATININE: 1.47 mg/dL — AB (ref 0.44–1.00)
Calcium: 8 mg/dL — ABNORMAL LOW (ref 8.9–10.3)
GFR, EST AFRICAN AMERICAN: 35 mL/min — AB (ref >60)
GFR, EST NON AFRICAN AMERICAN: 30 mL/min — AB (ref >60)
Glucose, Bld: 127 mg/dL — ABNORMAL HIGH (ref 65–99)
POTASSIUM: 4.9 mmol/L (ref 3.5–5.1)
SODIUM: 134 mmol/L — AB (ref 135–145)
Total Bilirubin: 0.3 mg/dL (ref 0.3–1.2)
Total Protein: 6.4 g/dL — ABNORMAL LOW (ref 6.5–8.1)

## 2015-05-18 LAB — URINE MICROSCOPIC-ADD ON

## 2015-05-18 LAB — LIPASE, BLOOD: Lipase: 141 U/L — ABNORMAL HIGH (ref 22–51)

## 2015-05-18 MED ORDER — ALBUTEROL SULFATE (2.5 MG/3ML) 0.083% IN NEBU: 2.5000 mg | INHALATION_SOLUTION | Freq: Four times a day (QID) | RESPIRATORY_TRACT | Status: DC | PRN

## 2015-05-18 MED ORDER — SODIUM CHLORIDE 0.9 % IV BOLUS (SEPSIS)
1000.0000 mL | Freq: Once | INTRAVENOUS | Status: AC
  Administered 2015-05-18: 1000 mL via INTRAVENOUS

## 2015-05-18 MED ORDER — OLOPATADINE HCL 0.1 % OP SOLN
1.0000 [drp] | Freq: Two times a day (BID) | OPHTHALMIC | Status: DC
  Administered 2015-05-19 – 2015-05-22 (×7): 1 [drp] via OPHTHALMIC
  Filled 2015-05-18: qty 5

## 2015-05-18 MED ORDER — ENOXAPARIN SODIUM 30 MG/0.3ML ~~LOC~~ SOLN
30.0000 mg | SUBCUTANEOUS | Status: DC
  Administered 2015-05-18 – 2015-05-19 (×2): 30 mg via SUBCUTANEOUS
  Filled 2015-05-18 (×2): qty 0.3

## 2015-05-18 MED ORDER — LORATADINE 10 MG PO TABS
10.0000 mg | ORAL_TABLET | Freq: Every day | ORAL | Status: DC
  Administered 2015-05-19 – 2015-05-22 (×4): 10 mg via ORAL
  Filled 2015-05-18 (×4): qty 1

## 2015-05-18 MED ORDER — GUAIFENESIN ER 600 MG PO TB12
600.0000 mg | ORAL_TABLET | Freq: Two times a day (BID) | ORAL | Status: DC
  Administered 2015-05-18 – 2015-05-22 (×8): 600 mg via ORAL
  Filled 2015-05-18 (×10): qty 1

## 2015-05-18 MED ORDER — LEVOTHYROXINE SODIUM 25 MCG PO TABS
137.0000 ug | ORAL_TABLET | Freq: Every day | ORAL | Status: DC
  Administered 2015-05-19: 137 ug via ORAL
  Filled 2015-05-18 (×2): qty 1

## 2015-05-18 MED ORDER — IPRATROPIUM-ALBUTEROL 0.5-2.5 (3) MG/3ML IN SOLN
3.0000 mL | Freq: Three times a day (TID) | RESPIRATORY_TRACT | Status: DC
  Administered 2015-05-18: 3 mL via RESPIRATORY_TRACT
  Filled 2015-05-18: qty 3

## 2015-05-18 MED ORDER — FLUTICASONE PROPIONATE 50 MCG/ACT NA SUSP
1.0000 | Freq: Every day | NASAL | Status: DC
  Administered 2015-05-19 – 2015-05-22 (×4): 1 via NASAL
  Filled 2015-05-18 (×2): qty 16

## 2015-05-18 MED ORDER — SODIUM CHLORIDE 0.9 % IV SOLN
1000.0000 mL | Freq: Once | INTRAVENOUS | Status: AC
  Administered 2015-05-18: 1000 mL via INTRAVENOUS

## 2015-05-18 NOTE — ED Notes (Signed)
[facility] called and spoke w/ Lerry Paterson to advise pt being admitted to the hospital.

## 2015-05-18 NOTE — ED Notes (Signed)
Spoke with NP from [facility]. Patient admitted to [facility] 9/9 from hospital admission. Family reported cough since admission. CXR normal several days ago, patient has been afebrile at facility. Reports increased Lipase/amylase over last week that has been treated with NPO status x 2 days and IVF. Cough has been treated at facility with Mucinex, duoneb, and last night was started on a 5 day regimen of augmentin. Bradycardia is new and it is questioned whether she was given another resident's betablocker/medications. Patient was normal last night when NP was at bedside for assessment/treatment. MD notified of NP's assessments/findings at facility.

## 2015-05-18 NOTE — H&P (Addendum)
History and Physical  Jo Hardin AQT:[id number] DOB: 01/28/1922 DOA: 05/18/2015  Referring physician: EDP PCP: No primary care provider on file.   Chief Complaint: lethargic, bradycardia, hypotension  HPI: Jo Hardin is a 81 y.o. female  With h/o breast cancer s/p lumpectomy and adjuvant chemo for about a year, h/o hypothyroidism, htn, hld, arthritis was brought to [facility] ED by EMS due to above complaints. Patient was recently discharged to SNF after being admitted for pancreatitis. Per family patient was normally talkative and alert, patient became more lethargic and only respond to sternal rub when EMS arrived. Family reported patient has been having nonproductive cough, no fever, no complaints of chest pain, family reported that patient has been made npo for the past two days due to elevation of lipase and was given ivf in the SNF. Family also noticed that patient has developed bilateral ankle edema in the past few days.  ED course, initial on arrival, sbp in the 70's heart rate in the low 50's. Initial work up showed acute renal failure, mild elevation of lipase, baseline anemia, cxr ? Atelectasis, ua + bacteria, negative nitrite, negative leukocytes. ct head no acute findings. Patient was given 2liter of ivf, bp and heart rate improving, mental status improving, per EDP, she talked to the SNF PA, there is a concern of betablocker was being given to the patient.   Currently, patient still lethargic, but open eyes to answer questions and following command. She is oriented to person. She denies pain. Does has intermittent dry cough during encounter.    Review of Systems:  Detail per HPI, Review of systems are otherwise negative  Past Medical History  Diagnosis Date  . Arthritis   . Thyroid disease   . Cancer     had breast cancer 8 yrs ago  . Hypertension   . Hypomagnesemia   . GERD (gastroesophageal reflux disease)   . Pancreatitis   . Osteoarthritis   . Hypokalemia    Past Surgical  History  Procedure Laterality Date  . Breast surgery    . Esophagogastroduodenoscopy N/A 05/07/2015    Procedure: ESOPHAGOGASTRODUODENOSCOPY (EGD);  Surgeon: Rogene Houston, MD;  Location: [facility];  Service: Endoscopy;  Laterality: N/A;   Social History:  reports that she has never smoked. She does not have any smokeless tobacco history on file. She reports that she does not drink alcohol or use illicit drugs. Patient lives at [facility] is able to participate in activities of daily living independently  with assistance  Allergies  Allergen Reactions  . Codeine Nausea And Vomiting  . Nsaids     No family history on file.    Prior to Admission medications   Medication Sig Start Date End Date Taking? Authorizing Provider  albuterol (PROVENTIL) (2.5 MG/3ML) 0.083% nebulizer solution Take 2.5 mg by nebulization every 6 (six) hours as needed for wheezing or shortness of breath.   Yes Historical Provider, MD  Amino Acids-Protein Hydrolys (FEEDING SUPPLEMENT, PRO-STAT SUGAR FREE 64,) LIQD Take 30 mLs by mouth 2 (two) times daily.   Yes Historical Provider, MD  amoxicillin-clavulanate (AUGMENTIN) 500-125 MG per tablet Take 1 tablet by mouth 2 (two) times daily. 5 day course starting on 05/18/15   Yes Historical Provider, MD  docusate sodium (COLACE) 100 MG capsule Take 100 mg by mouth every morning.   Yes Historical Provider, MD  fluticasone (FLONASE) 50 MCG/ACT nasal spray Place 1 spray into the nose 2 (two) times daily.   Yes Historical Provider, MD  guaiFENesin (  MUCINEX) 600 MG 12 hr tablet Take 600 mg by mouth 2 (two) times daily.   Yes Historical Provider, MD  levothyroxine (SYNTHROID, LEVOTHROID) 137 MCG tablet Take 137 mcg by mouth daily before breakfast.   Yes Historical Provider, MD  loratadine (CLARITIN) 10 MG tablet Take 10 mg by mouth daily.   Yes Historical Provider, MD  losartan (COZAAR) 100 MG tablet Take 100 mg by mouth daily.   Yes Historical Provider, MD  magnesium oxide (MAG-OX)  400 MG tablet Take 400 mg by mouth daily.   Yes Historical Provider, MD  Olopatadine HCl (PATADAY) 0.2 % SOLN Place 1 drop into both eyes daily.   Yes Historical Provider, MD  omeprazole (PRILOSEC) 20 MG capsule Take 20 mg by mouth daily.   Yes Historical Provider, MD  potassium chloride (K-DUR) 10 MEQ tablet Take 10 mEq by mouth 2 (two) times daily.   Yes Historical Provider, MD  simvastatin (ZOCOR) 10 MG tablet Take 10 mg by mouth at bedtime.   Yes Historical Provider, MD  Vitamin D, Ergocalciferol, (DRISDOL) 50000 UNITS CAPS Take 50,000 Units by mouth every Tuesday.    Yes Historical Provider, MD    Physical Exam: BP 96/62 mmHg  Pulse 80  Temp(Src) 98.4 F (36.9 C) (Rectal)  Resp 16  Wt 120 lb (54.432 kg)  SpO2 100%  General:  Lethargic, open eyes to answer questions. Eyes: PERRL ENT: unremarkable Neck: supple, no JVD Cardiovascular: bradycardia Respiratory: + crackles at bases. + rhonchi, No wheezing Abdomen: soft/ND/ND, positive bowel sounds Skin: no rash Musculoskeletal:  No edema Psychiatric: calm/cooperative Neurologic: lethargic, oriented to person only. Following command.          Labs on Admission:  Basic Metabolic Panel:  Recent Labs Lab 05/18/15 1736  NA 134*  K 4.9  CL 102  CO2 25  GLUCOSE 127*  BUN 11  CREATININE 1.47*  CALCIUM 8.0*   Liver Function Tests:  Recent Labs Lab 05/18/15 1736  AST 16  ALT 8*  ALKPHOS 51  BILITOT 0.3  PROT 6.4*  ALBUMIN 2.6*    Recent Labs Lab 05/18/15 1756  LIPASE 141*   No results for input(s): AMMONIA in the last 168 hours. CBC:  Recent Labs Lab 05/18/15 1736  WBC 6.3  NEUTROABS 4.2  HGB 9.6*  HCT 28.8*  MCV 85.5  PLT 420*   Cardiac Enzymes: No results for input(s): CKTOTAL, CKMB, CKMBINDEX, TROPONINI in the last 168 hours.  BNP (last 3 results) No results for input(s): BNP in the last 8760 hours.  ProBNP (last 3 results) No results for input(s): PROBNP in the last 8760 hours.  CBG: No  results for input(s): GLUCAP in the last 168 hours.  Radiological Exams on Admission: Ct Head Wo Contrast  05/18/2015   CLINICAL DATA:  Altered mental status today.  EXAM: CT HEAD WITHOUT CONTRAST  TECHNIQUE: Contiguous axial images were obtained from the base of the skull through the vertex without intravenous contrast.  COMPARISON:  10/28/2012  FINDINGS: No mass effect or midline shift. No evidence of acute intracranial hemorrhage, or infarction. No abnormal extra-axial fluid collections. Gray-white matter differentiation is normal. Basal cisterns are preserved. There is atrophy and chronic small vessel disease changes. Left cerebellar punctate hypoattenuation likely represents an old lacunar infarct.  No depressed skull fractures. Right sphenoid sinus is nearly completely opacified with air-fluid level accounted dependently. Visualized paranasal sinuses and mastoid air cells are otherwise not opacified.  IMPRESSION: No acute intracranial abnormality.  Atrophy, chronic microvascular disease.  Probable old left cerebellar lacunar infarct.  Right sphenoid sinusitis. The high density material within the sphenoid sinus raise a concern of possible fungal sinusitis.   Electronically Signed   By: Fidela Salisbury M.D.   On: 05/18/2015 18:40   Ct Chest Wo Contrast  05/18/2015   CLINICAL DATA:  82 year old with cough, weakness and shortness of breath. Initial encounter.  EXAM: CT CHEST WITHOUT CONTRAST  TECHNIQUE: Multidetector CT imaging of the chest was performed following the standard protocol without IV contrast.  COMPARISON:  Radiographs 05/18/2015.  Abdominal CT 05/03/2015.  FINDINGS: Mediastinum/Nodes: There are no enlarged mediastinal, hilar or axillary lymph nodes.Right axillary surgical clips are noted. The thyroid gland, trachea and esophagus demonstrate no significant findings. The heart is mildly enlarged and there is a small pericardial effusion. There is relatively mild atherosclerosis of the  aorta, great vessels and coronary arteries for age.  Lungs/Pleura: There is trace pleural fluid on the left. The lungs demonstrate diffuse central airway thickening with increased dependent opacity in the left lower lobe which may reflect atelectasis or a developing infiltrate.  Upper abdomen: Stable mild edematous changes. No acute findings demonstrated.  Musculoskeletal/Chest wall: There is no chest wall mass or suspicious osseous finding. There is a mild superior endplate compression deformity at T5, age indeterminate. Chronic appearing inferior endplate Schmorl's node at T12 is unchanged from recent abdominal CT.  IMPRESSION: 1. New small left pleural effusion with increased left lower lobe atelectasis or early infiltrate. No consolidation. 2. Underlying chronic central airway thickening. 3. Mild atherosclerosis. 4. No evidence of metastatic breast cancer. 5. Age indeterminate mild T5 compression deformity.   Electronically Signed   By: Richardean Sale M.D.   On: 05/18/2015 20:54   Dg Chest Port 1 View  05/18/2015   CLINICAL DATA:  Lethargy with altered mental status today. History of hypertension and breast cancer. Initial encounter.  EXAM: PORTABLE CHEST - 1 VIEW  COMPARISON:  Abdominal CT 05/03/2015.  FINDINGS: 1754 hours. Patient is rotated to the right. Cardiomegaly and aortic ectasia are similar to that demonstrated on prior abdominal CT. There is mild chronic atelectasis at both lung bases. No confluent airspace opacity, edema or significant pleural effusion. Patient is status post right mastectomy and axillary node dissection. Degenerative changes are present at both shoulders. No acute osseous findings seen.  IMPRESSION: No acute cardiopulmonary process. Cardiomegaly and chronic bibasilar atelectasis.   Electronically Signed   By: Richardean Sale M.D.   On: 05/18/2015 18:13    EKG: Independently reviewed. Sinus bradyacardia, diffuse dep t wave inversion.  Assessment/Plan Present on Admission:    . Hypotension . Unresponsive episode  Unresponsive episode, hypotension, bradycardia, unclear etiology, EKG showed diffuse Twave inversion. No prior ekg for comparison.  Will cycle cardiac enzymes, echo pending. Discussed with Dr. Wynonia Lawman advised the same and Dr. Wynonia Lawman request am hospitalist to call cardiology in am.  H/o HTN, now hypotension, hold losartan.  ARF: from dehydration? UA no obvious sign of infection. Received ivf in the ED, repeat bmp in am.  Bilateral ankle edema: check venous doppler and echocardiogram, close monitor volume status.  Hypothyroidism: continue synthroid  Elevation of lipase, denies pain, no n/v , will keep npo except meds, repeat lipase in am. Will avoid satin in the setting of elevated lipase.  DVT prophylaxis: lovenox  Consultants: cardiology, discussed with Dr. Wynonia Lawman over the phone.  Code Status: for now full code,  Family states knowing the CPR might lead to broken ribs, they are going to continue  discuss among family members.  Family Communication:  Patient and her daughter (who is going to stay the night), patient's granddaughter, patient's niece.  Disposition Plan: admit to med tele  Time spent: 37mins  Xu,Fang MD, PhD [facility] Pager [phone] If 7PM-7AM, please contact night-coverage at [email or web address], password [facility]

## 2015-05-18 NOTE — ED Provider Notes (Signed)
CSN: [id number]     Arrival date & time 05/18/15  1712 History   First MD Initiated Contact with Patient 05/18/15 1724     Chief Complaint  Patient presents with  . Altered Mental Status     (Consider location/radiation/quality/duration/timing/severity/associated sxs/prior Treatment) HPI   Patient is a 81 year old female presenting from nursing home with altered mental status. Patient was reportedly normal yesterday. Family reports that she's been becoming increasingly confused at the nursing home. They reported that she was coughing occasionally and so they were worried about an infection and they just started her antibiotics yesterday. Patientpast medical history significant for thyroid disease, pancreatitis and remote breast cancer.     Level V caveat altered mental status  NP said she has had chronic cough. Patient had increasing lipase at [facility] and they made her NPO for two days (with fluids). Started on augmentin yesterday for sinus congestion.   Of note she is not on any beta blockers but another [facility] on the floor (her neighbor) is on beta blockers.   Past Medical History  Diagnosis Date  . Arthritis   . Thyroid disease   . Cancer     had breast cancer 8 yrs ago  . Hypertension   . Hypomagnesemia   . GERD (gastroesophageal reflux disease)   . Pancreatitis   . Osteoarthritis   . Hypokalemia    Past Surgical History  Procedure Laterality Date  . Breast surgery    . Esophagogastroduodenoscopy N/A 05/07/2015    Procedure: ESOPHAGOGASTRODUODENOSCOPY (EGD);  Surgeon: Rogene Houston, MD;  Location: [facility];  Service: Endoscopy;  Laterality: N/A;   No family history on file. Social History  Substance Use Topics  . Smoking status: Never Smoker   . Smokeless tobacco: None  . Alcohol Use: No   OB History    No data available     Review of Systems  Unable to perform ROS: Mental status change      Allergies  Codeine and Nsaids  Home Medications   Prior to  Admission medications   Medication Sig Start Date End Date Taking? Authorizing Provider  albuterol (PROVENTIL) (2.5 MG/3ML) 0.083% nebulizer solution Take 2.5 mg by nebulization every 6 (six) hours as needed for wheezing or shortness of breath.   Yes Historical Provider, MD  Amino Acids-Protein Hydrolys (FEEDING SUPPLEMENT, PRO-STAT SUGAR FREE 64,) LIQD Take 30 mLs by mouth 2 (two) times daily.   Yes Historical Provider, MD  amoxicillin-clavulanate (AUGMENTIN) 500-125 MG per tablet Take 1 tablet by mouth 2 (two) times daily. 5 day course starting on 05/18/15   Yes Historical Provider, MD  docusate sodium (COLACE) 100 MG capsule Take 100 mg by mouth every morning.   Yes Historical Provider, MD  fluticasone (FLONASE) 50 MCG/ACT nasal spray Place 1 spray into the nose 2 (two) times daily.   Yes Historical Provider, MD  guaiFENesin (MUCINEX) 600 MG 12 hr tablet Take 600 mg by mouth 2 (two) times daily.   Yes Historical Provider, MD  levothyroxine (SYNTHROID, LEVOTHROID) 137 MCG tablet Take 137 mcg by mouth daily before breakfast.   Yes Historical Provider, MD  loratadine (CLARITIN) 10 MG tablet Take 10 mg by mouth daily.   Yes Historical Provider, MD  losartan (COZAAR) 100 MG tablet Take 100 mg by mouth daily.   Yes Historical Provider, MD  magnesium oxide (MAG-OX) 400 MG tablet Take 400 mg by mouth daily.   Yes Historical Provider, MD  Olopatadine HCl (PATADAY) 0.2 % SOLN Place 1  drop into both eyes daily.   Yes Historical Provider, MD  omeprazole (PRILOSEC) 20 MG capsule Take 20 mg by mouth daily.   Yes Historical Provider, MD  potassium chloride (K-DUR) 10 MEQ tablet Take 10 mEq by mouth 2 (two) times daily.   Yes Historical Provider, MD  simvastatin (ZOCOR) 10 MG tablet Take 10 mg by mouth at bedtime.   Yes Historical Provider, MD  Vitamin D, Ergocalciferol, (DRISDOL) 50000 UNITS CAPS Take 50,000 Units by mouth every Tuesday.    Yes Historical Provider, MD   BP 114/55 mmHg  Pulse 58  Temp(Src)  98.2 F (36.8 C) (Oral)  Resp 16  Ht 4\' 11"  (1.499 m)  Wt 135 lb 5.8 oz (61.4 kg)  BMI 27.33 kg/m2  SpO2 94% Physical Exam  Constitutional: She appears well-developed and well-nourished.  Patient sleepy, awakens to sternal rub and follows commands.  HENT:  Head: Normocephalic and atraumatic.  Eyes: Conjunctivae are normal. Right eye exhibits no discharge.  Neck: Neck supple.  Cardiovascular: Regular rhythm and normal heart sounds.   No murmur heard. Bradycardic  Pulmonary/Chest: Effort normal and breath sounds normal. She has no wheezes. She has no rales.  Abdominal: Soft. She exhibits no distension. There is no tenderness.  Musculoskeletal: Normal range of motion. She exhibits no edema.  Neurological: No cranial nerve deficit.  Skin: Skin is warm and dry. She is not diaphoretic.  Nursing note and vitals reviewed.   ED Course  Procedures (including critical care time) Labs Review Labs Reviewed  COMPREHENSIVE METABOLIC PANEL - Abnormal; Notable for the following:    Sodium 134 (*)    Glucose, Bld 127 (*)    Creatinine, Ser 1.47 (*)    Calcium 8.0 (*)    Total Protein 6.4 (*)    Albumin 2.6 (*)    ALT 8 (*)    GFR calc non Af Amer 30 (*)    GFR calc Af Amer 35 (*)    All other components within normal limits  CBC WITH DIFFERENTIAL/PLATELET - Abnormal; Notable for the following:    RBC 3.37 (*)    Hemoglobin 9.6 (*)    HCT 28.8 (*)    RDW 15.8 (*)    Platelets 420 (*)    All other components within normal limits  URINALYSIS, ROUTINE W REFLEX MICROSCOPIC (NOT AT [facility]) - Abnormal; Notable for the following:    APPearance HAZY (*)    Hgb urine dipstick MODERATE (*)    All other components within normal limits  TSH - Abnormal; Notable for the following:    TSH 11.478 (*)    All other components within normal limits  LIPASE, BLOOD - Abnormal; Notable for the following:    Lipase 141 (*)    All other components within normal limits  URINE MICROSCOPIC-ADD ON - Abnormal;  Notable for the following:    Squamous Epithelial / LPF MANY (*)    Bacteria, UA MANY (*)    All other components within normal limits  CULTURE, BLOOD (ROUTINE X 2)  CULTURE, BLOOD (ROUTINE X 2)  URINE CULTURE  CULTURE, EXPECTORATED SPUTUM-ASSESSMENT  MRSA PCR SCREENING  TROPONIN I  TROPONIN I  TROPONIN I  COMPREHENSIVE METABOLIC PANEL  CBC  LIPASE, BLOOD  I-STAT CG4 LACTIC ACID, ED  I-STAT TROPOININ, ED  I-STAT CG4 LACTIC ACID, ED    Imaging Review Ct Head Wo Contrast  05/18/2015   CLINICAL DATA:  Altered mental status today.  EXAM: CT HEAD WITHOUT CONTRAST  TECHNIQUE: Contiguous axial  images were obtained from the base of the skull through the vertex without intravenous contrast.  COMPARISON:  10/28/2012  FINDINGS: No mass effect or midline shift. No evidence of acute intracranial hemorrhage, or infarction. No abnormal extra-axial fluid collections. Gray-white matter differentiation is normal. Basal cisterns are preserved. There is atrophy and chronic small vessel disease changes. Left cerebellar punctate hypoattenuation likely represents an old lacunar infarct.  No depressed skull fractures. Right sphenoid sinus is nearly completely opacified with air-fluid level accounted dependently. Visualized paranasal sinuses and mastoid air cells are otherwise not opacified.  IMPRESSION: No acute intracranial abnormality.  Atrophy, chronic microvascular disease.  Probable old left cerebellar lacunar infarct.  Right sphenoid sinusitis. The high density material within the sphenoid sinus raise a concern of possible fungal sinusitis.   Electronically Signed   By: Fidela Salisbury M.D.   On: 05/18/2015 18:40   Ct Chest Wo Contrast  05/18/2015   CLINICAL DATA:  80 year old with cough, weakness and shortness of breath. Initial encounter.  EXAM: CT CHEST WITHOUT CONTRAST  TECHNIQUE: Multidetector CT imaging of the chest was performed following the standard protocol without IV contrast.  COMPARISON:   Radiographs 05/18/2015.  Abdominal CT 05/03/2015.  FINDINGS: Mediastinum/Nodes: There are no enlarged mediastinal, hilar or axillary lymph nodes.Right axillary surgical clips are noted. The thyroid gland, trachea and esophagus demonstrate no significant findings. The heart is mildly enlarged and there is a small pericardial effusion. There is relatively mild atherosclerosis of the aorta, great vessels and coronary arteries for age.  Lungs/Pleura: There is trace pleural fluid on the left. The lungs demonstrate diffuse central airway thickening with increased dependent opacity in the left lower lobe which may reflect atelectasis or a developing infiltrate.  Upper abdomen: Stable mild edematous changes. No acute findings demonstrated.  Musculoskeletal/Chest wall: There is no chest wall mass or suspicious osseous finding. There is a mild superior endplate compression deformity at T5, age indeterminate. Chronic appearing inferior endplate Schmorl's node at T12 is unchanged from recent abdominal CT.  IMPRESSION: 1. New small left pleural effusion with increased left lower lobe atelectasis or early infiltrate. No consolidation. 2. Underlying chronic central airway thickening. 3. Mild atherosclerosis. 4. No evidence of metastatic breast cancer. 5. Age indeterminate mild T5 compression deformity.   Electronically Signed   By: Richardean Sale M.D.   On: 05/18/2015 20:54   Dg Chest Port 1 View  05/18/2015   CLINICAL DATA:  Lethargy with altered mental status today. History of hypertension and breast cancer. Initial encounter.  EXAM: PORTABLE CHEST - 1 VIEW  COMPARISON:  Abdominal CT 05/03/2015.  FINDINGS: 1754 hours. Patient is rotated to the right. Cardiomegaly and aortic ectasia are similar to that demonstrated on prior abdominal CT. There is mild chronic atelectasis at both lung bases. No confluent airspace opacity, edema or significant pleural effusion. Patient is status post right mastectomy and axillary node  dissection. Degenerative changes are present at both shoulders. No acute osseous findings seen.  IMPRESSION: No acute cardiopulmonary process. Cardiomegaly and chronic bibasilar atelectasis.   Electronically Signed   By: Richardean Sale M.D.   On: 05/18/2015 18:13   I have personally reviewed and evaluated these images and lab results as part of my medical decision-making.   EKG Interpretation None     Muse not working EKG HR 51 NSR, no block, QT574 Deep inverted Ts laterally My impression: sinus bradycardia  MDM   Final diagnoses:  Hypotension, unspecified hypotension type   patient is a 81 year old female presenting today  with altered mental status. Patient was last seen normal last yesterday evening. Patient has been on an nothing by mouth diet with fluids for the last 2 days because of increasing lipase at the nursing home. Patient had recent hospital physician for pancreatitis. Patient also started on Augmentin today for sinus congestion.  Patient is very bradycardic here. I'm concerned that this is a result of accidental medication ingestion of another patient's. Also concerned electrolyste imbalance given that she's been NPO and fluids. We will check for sepsis, do CT head given her altered mental status.    Courteney Julio Alm, MD 05/18/15 [phone]

## 2015-05-18 NOTE — ED Notes (Signed)
Family at bedside. 

## 2015-05-18 NOTE — ED Notes (Signed)
Attempted to call [facility] multiple times to speak with a caregiver that knows her well. Unable to reach anyone that is knowledgeable of her. Left name and number for caregiver to call back.

## 2015-05-18 NOTE — ED Notes (Signed)
Dr. Thomasene Lot notified of BP, pt opening eyes talking with family.

## 2015-05-18 NOTE — ED Notes (Signed)
Patient arrives via EMS from [place] with c/o AMS that started sometime during the day. Family reports she is normally talkative and alert and was last seen this way by them this morning. Patient is currently responsive to physical stimulus, lethargic when awake, and does not appropriately answer questions. Bradycardia, hypotensive.

## 2015-05-19 ENCOUNTER — Inpatient Hospital Stay (HOSPITAL_COMMUNITY): Payer: Medicare Other

## 2015-05-19 ENCOUNTER — Encounter (HOSPITAL_COMMUNITY): Payer: Self-pay | Admitting: Internal Medicine

## 2015-05-19 DIAGNOSIS — J189 Pneumonia, unspecified organism: Secondary | ICD-10-CM | POA: Diagnosis present

## 2015-05-19 DIAGNOSIS — G9340 Encephalopathy, unspecified: Secondary | ICD-10-CM | POA: Diagnosis present

## 2015-05-19 DIAGNOSIS — I959 Hypotension, unspecified: Secondary | ICD-10-CM

## 2015-05-19 DIAGNOSIS — R001 Bradycardia, unspecified: Secondary | ICD-10-CM | POA: Diagnosis present

## 2015-05-19 DIAGNOSIS — I9589 Other hypotension: Secondary | ICD-10-CM

## 2015-05-19 DIAGNOSIS — E039 Hypothyroidism, unspecified: Secondary | ICD-10-CM

## 2015-05-19 DIAGNOSIS — J323 Chronic sphenoidal sinusitis: Secondary | ICD-10-CM | POA: Diagnosis present

## 2015-05-19 DIAGNOSIS — D509 Iron deficiency anemia, unspecified: Secondary | ICD-10-CM | POA: Diagnosis present

## 2015-05-19 DIAGNOSIS — R0600 Dyspnea, unspecified: Secondary | ICD-10-CM

## 2015-05-19 DIAGNOSIS — N179 Acute kidney failure, unspecified: Secondary | ICD-10-CM

## 2015-05-19 LAB — COMPREHENSIVE METABOLIC PANEL
ALT: 7 U/L — AB (ref 14–54)
AST: 14 U/L — AB (ref 15–41)
Albumin: 2.3 g/dL — ABNORMAL LOW (ref 3.5–5.0)
Alkaline Phosphatase: 45 U/L (ref 38–126)
Anion gap: 5 (ref 5–15)
BILIRUBIN TOTAL: 0.6 mg/dL (ref 0.3–1.2)
BUN: 13 mg/dL (ref 6–20)
CALCIUM: 7.4 mg/dL — AB (ref 8.9–10.3)
CO2: 22 mmol/L (ref 22–32)
CREATININE: 1.07 mg/dL — AB (ref 0.44–1.00)
Chloride: 108 mmol/L (ref 101–111)
GFR calc Af Amer: 51 mL/min — ABNORMAL LOW (ref >60)
GFR, EST NON AFRICAN AMERICAN: 44 mL/min — AB (ref >60)
Glucose, Bld: 98 mg/dL (ref 65–99)
POTASSIUM: 4.4 mmol/L (ref 3.5–5.1)
Sodium: 135 mmol/L (ref 135–145)
TOTAL PROTEIN: 5.6 g/dL — AB (ref 6.5–8.1)

## 2015-05-19 LAB — CBC
HCT: 25.6 % — ABNORMAL LOW (ref 36.0–46.0)
HEMOGLOBIN: 8.4 g/dL — AB (ref 12.0–15.0)
MCH: 28.1 pg (ref 26.0–34.0)
MCHC: 32.8 g/dL (ref 30.0–36.0)
MCV: 85.6 fL (ref 78.0–100.0)
Platelets: 367 10*3/uL (ref 150–400)
RBC: 2.99 MIL/uL — AB (ref 3.87–5.11)
RDW: 15.9 % — ABNORMAL HIGH (ref 11.5–15.5)
WBC: 4.8 10*3/uL (ref 4.0–10.5)

## 2015-05-19 LAB — POCT I-STAT TROPONIN I: TROPONIN I, POC: 0 ng/mL (ref 0.00–0.08)

## 2015-05-19 LAB — TROPONIN I: Troponin I: <0.03 ng/mL (ref <0.031)

## 2015-05-19 LAB — T4, FREE: Free T4: 1.12 ng/dL (ref 0.61–1.12)

## 2015-05-19 LAB — MRSA PCR SCREENING: MRSA BY PCR: NEGATIVE

## 2015-05-19 LAB — LIPASE, BLOOD: LIPASE: 94 U/L — AB (ref 22–51)

## 2015-05-19 MED ORDER — LEVOTHYROXINE SODIUM 75 MCG PO TABS
150.0000 ug | ORAL_TABLET | Freq: Every day | ORAL | Status: DC
  Administered 2015-05-20 – 2015-05-22 (×3): 150 ug via ORAL
  Filled 2015-05-19 (×3): qty 2

## 2015-05-19 MED ORDER — DEXTROSE 5 % IV SOLN
2.0000 g | INTRAVENOUS | Status: DC
  Administered 2015-05-19 – 2015-05-20 (×2): 2 g via INTRAVENOUS
  Filled 2015-05-19 (×3): qty 2

## 2015-05-19 MED ORDER — VANCOMYCIN HCL IN DEXTROSE 1-5 GM/200ML-% IV SOLN
1000.0000 mg | INTRAVENOUS | Status: DC
  Filled 2015-05-19: qty 200

## 2015-05-19 MED ORDER — PREDNISONE 20 MG PO TABS
20.0000 mg | ORAL_TABLET | Freq: Two times a day (BID) | ORAL | Status: DC
Start: 2015-05-19 — End: 2015-05-22
  Administered 2015-05-19 – 2015-05-22 (×7): 20 mg via ORAL
  Filled 2015-05-19 (×7): qty 1

## 2015-05-19 MED ORDER — SODIUM CHLORIDE 0.9 % IV SOLN
INTRAVENOUS | Status: DC
  Administered 2015-05-19 – 2015-05-20 (×2): via INTRAVENOUS

## 2015-05-19 MED ORDER — SODIUM CHLORIDE 0.9 % IV BOLUS (SEPSIS)
300.0000 mL | Freq: Once | INTRAVENOUS | Status: AC
  Administered 2015-05-19: 300 mL via INTRAVENOUS

## 2015-05-19 MED ORDER — FERROUS SULFATE 325 (65 FE) MG PO TABS
325.0000 mg | ORAL_TABLET | Freq: Every day | ORAL | Status: DC
  Administered 2015-05-20 – 2015-05-22 (×3): 325 mg via ORAL
  Filled 2015-05-19 (×3): qty 1

## 2015-05-19 MED ORDER — SODIUM CHLORIDE 0.9 % IV SOLN
1250.0000 mg | Freq: Once | INTRAVENOUS | Status: AC
  Administered 2015-05-19: 1250 mg via INTRAVENOUS
  Filled 2015-05-19: qty 1250

## 2015-05-19 MED ORDER — IPRATROPIUM-ALBUTEROL 0.5-2.5 (3) MG/3ML IN SOLN
3.0000 mL | Freq: Three times a day (TID) | RESPIRATORY_TRACT | Status: DC
  Administered 2015-05-19 – 2015-05-21 (×8): 3 mL via RESPIRATORY_TRACT
  Filled 2015-05-19 (×9): qty 3

## 2015-05-19 NOTE — Progress Notes (Signed)
Pt tolerated clear liquid diet AEB no nausea, vomiting, or abdominal pain. Advance diet as ordered to full liquids.

## 2015-05-19 NOTE — Progress Notes (Signed)
ANTIBIOTIC CONSULT NOTE - INITIAL  Pharmacy Consult for Ceftazidime and Vancomycin Indication: pneumonia  Allergies  Allergen Reactions  . Codeine Nausea And Vomiting  . Nsaids     Patient Measurements: Height: 4\' 11"  (149.9 cm) Weight: 135 lb 5.8 oz (61.4 kg) IBW/kg (Calculated) : 43.2 Adjusted Body Weight: 51.6  Vital Signs: Temp: 97.5 F (36.4 C) (09/19 0445) Temp Source: Oral (09/19 0445) BP: 93/46 mmHg (09/19 0535) Pulse Rate: 50 (09/19 0535) Intake/Output from previous day: 09/18 0701 - 09/19 0700 In: 2300 [I.V.:2300] Out: -  Intake/Output from this shift:    Labs:  Recent Labs  05/18/15 1736  WBC 6.3  HGB 9.6*  PLT 420*  CREATININE 1.47*   Estimated Creatinine Clearance: 19.9 mL/min (by C-G formula based on Cr of 1.47). No results for input(s): VANCOTROUGH, VANCOPEAK, VANCORANDOM, GENTTROUGH, GENTPEAK, GENTRANDOM, TOBRATROUGH, TOBRAPEAK, TOBRARND, AMIKACINPEAK, AMIKACINTROU, AMIKACIN in the last 72 hours.   Microbiology: Recent Results (from the past 720 hour(s))  Blood Culture (routine x 2)     Status: None (Preliminary result)   Collection Time: 05/18/15  5:36 PM  Result Value Ref Range Status   Specimen Description BLOOD LEFT ARM  Final   Special Requests BOTTLES DRAWN AEROBIC AND ANAEROBIC [place]  Final   Culture PENDING  Incomplete   Report Status PENDING  Incomplete  Blood Culture (routine x 2)     Status: None (Preliminary result)   Collection Time: 05/18/15  5:50 PM  Result Value Ref Range Status   Specimen Description BLOOD RIGHT ARM  Final   Special Requests BOTTLES DRAWN AEROBIC AND ANAEROBIC 8CC  Final   Culture PENDING  Incomplete   Report Status PENDING  Incomplete  MRSA PCR Screening     Status: None   Collection Time: 05/18/15 11:14 PM  Result Value Ref Range Status   MRSA by PCR NEGATIVE NEGATIVE Final    Comment:        The GeneXpert MRSA Assay (FDA approved for NASAL specimens only), is one component of a comprehensive MRSA  colonization surveillance program. It is not intended to diagnose MRSA infection nor to guide or monitor treatment for MRSA infections.     Medical History: Past Medical History  Diagnosis Date  . Arthritis   . Thyroid disease   . Cancer     had breast cancer 8 yrs ago  . Hypertension   . Hypomagnesemia   . GERD (gastroesophageal reflux disease)   . Pancreatitis   . Osteoarthritis   . Hypokalemia   . Pancreatitis, acute 05/03/2015  . 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 albuterol (PROVENTIL) (2.5 MG/3ML) 0.083% nebulizer solution Take 2.5 mg by nebulization every 6 (six) hours as needed for wheezing or shortness of breath.   05/17/2015 at Unknown time  . Amino Acids-Protein Hydrolys (FEEDING SUPPLEMENT, PRO-STAT SUGAR FREE 64,) LIQD Take 30 mLs by mouth 2 (two) times daily.   05/18/2015 at 900a  . amoxicillin-clavulanate (AUGMENTIN) 500-125 MG per tablet Take 1 tablet by mouth 2 (two) times daily. 5 day course starting on 05/18/15   05/18/2015 at Unknown time  . docusate sodium (COLACE) 100 MG capsule Take 100 mg by mouth every morning.   05/18/2015 at [place]  . fluticasone (FLONASE) 50 MCG/ACT nasal spray Place 1 spray into the nose 2 (two) times daily.   05/18/2015 at 900a  . guaiFENesin ([place]) 600  MG 12 hr tablet Take 600 mg by mouth 2 (two) times daily.   05/18/2015 at 900a  . levothyroxine (SYNTHROID, LEVOTHROID) 137 MCG tablet Take 137 mcg by mouth daily before breakfast.   05/18/2015 at 600a  . loratadine (CLARITIN) 10 MG tablet Take 10 mg by mouth daily.   05/18/2015 at 900a  . losartan (COZAAR) 100 MG tablet Take 100 mg by mouth daily.   05/18/2015 at 900a  . magnesium oxide (MAG-OX) 400 MG tablet Take 400 mg by mouth daily.   05/18/2015 at Unknown time  . Olopatadine HCl (PATADAY) 0.2 % SOLN Place 1 drop into both eyes daily.   05/17/2015 at 2200  . omeprazole  (PRILOSEC) 20 MG capsule Take 20 mg by mouth daily.   05/18/2015 at 800a  . potassium chloride (K-DUR) 10 MEQ tablet Take 10 mEq by mouth 2 (two) times daily.   05/18/2015 at 900a  . simvastatin (ZOCOR) 10 MG tablet Take 10 mg by mouth at bedtime.   05/17/2015 at 2200  . Vitamin D, Ergocalciferol, (DRISDOL) 50000 UNITS CAPS Take 50,000 Units by mouth every Tuesday.    05/13/2015 at Unknown time   Assessment: 81 yo female with h.o breast CA s/p lumpectomy and adjuvant chemo for about a year. Recently discharged  To SNF after acute pancreatitis. Presented  With ARF, HR in low 50's and SBP in 70's. Patient was lethargic. CT of chest shows New small left pleural effusion with increased left lower lobe atelectasis or early infiltrate. No consolidation. Non-productive cough, no fever. Empiric tx for HCAP initiated Crcl = 50ml/min.   Goal of Therapy:  Resolution of ARF and PNA. Vancomycin trough level 15-20 mcg/ml  Plan:  Ceftazdime 2g IV q24h Vancomycin 1250mg  loading dose, then 1gm iv q48h Expected duration 7 days with resolution of temperature and/or normalization of WBC   Isac Sarna, BS Pharm D, BCPS Clinical Pharmacist  05/19/2015,8:16 AM

## 2015-05-19 NOTE — Progress Notes (Signed)
Notified Dyanne Carrel, NP about advancing the patients diet since she had been NPO the last 2 days related to her Lipase being elevated.  Voiced to her that the patient stated that she thought she could eat, and she has no complaints of stomach discomfort, nausea, or vomiting. New orders given and followed.

## 2015-05-19 NOTE — Progress Notes (Signed)
[facility] PROGRESS NOTE  Jo Hardin GGY:[id number] DOB: [birth year] DOA: 05/18/2015 PCP: No primary care provider on file.  Assessment/Plan: Acute encephalopathy/unresponsive episode: maybe related to infectious process ie. Sinusitis and possible HCAP in setting of AKI. Troponin neg x2, EKG with SR LBBB, no events on tele, await echocardiogram results.  CT head without acute abnormality. Improved this am. Responds to verbal stimuli and follows simple commands. She is afebrile and non-toxic appearing. Continue gentle IV fluids and antibiotics. Active Problems:  HCAP (healthcare-associated pneumonia): CT chest concerning for LLL atelectasis or infiltrate. She remains afebrile with somewhat soft BP. Will obtain strep pneumonia urine antigen and legionella urine antigen. Vancomycin and ceftazidime day #1. Oxygen saturation level >90%.     AKI (acute kidney injury): may be related to decreased po intake in setting of hypotension. Improving. Will continue gentle IV fluids. Will hold nephrotoxins. Monitor urine output.  Recheck in am.    Hypotension: etiology unclear. Improved but remains on low end of normal. Holding losartan    Bradycardia: evaluated by cardiology who opine no significant pauses on tele and recommend echocardiogram as well as repletion of thyroid. Patient denies CP.  Some question as to possibility that patient received another patient's medications while at facility as there is another elderly female named Jo Hardin.     Hypothyroidism: TSH 11.4. Await Free T4 level. Continue  Synthroid current dose 137 mcg    Iron deficiency anemia: chart review indicates hx of same. Anemia panel 2 weeks ago with iron level 6. EGD done 2 weeks ago revealed normal ampulla of vater and few gastric antral vascular ectasia without stigmata of bleed but otherwise showed no evidence of peptic ulcer disease. Advised to avoid NSAIDS. Continue PPI        Sphenoid sinusitis: antibiotics as noted above.      Code Status: full Family Communication: none present Disposition Plan: back to facility when ready   Consultants:  cardiology  Procedures:  Echo   Antibiotics:  Vancomycin 05/19/15>>  ceftazidime 05/19/15>>  HPI/Subjective: Lying in bed. Arouses to verbal stimuli.   Objective: Filed Vitals:   05/19/15 0837  BP: 98/54  Pulse: 54  Temp: 98.3 F (36.8 C)  Resp: 18    Intake/Output Summary (Last 24 hours) at 05/19/15 1323 Last data filed at 05/19/15 0850  Gross per 24 hour  Intake   2300 ml  Output      0 ml  Net   2300 ml   Filed Weights   05/18/15 1716 05/18/15 2203  Weight: 54.432 kg (120 lb) 61.4 kg (135 lb 5.8 oz)    Exam:   General:  Obese somewhat pale appearing. lethargic  Cardiovascular: RRR +murmur no LE edema  Respiratory: normal effort but somewhat shallow. Weak cough effort during exam. BS diminished and coarse no crackles  Abdomen: obese soft +BS non-tender  Musculoskeletal: joints without swelling/erythema   Data Reviewed: Basic Metabolic Panel:  Recent Labs Lab 05/18/15 1736 05/19/15 0916  NA 134* 135  K 4.9 4.4  CL 102 108  CO2 25 22  GLUCOSE 127* 98  BUN 11 13  CREATININE 1.47* 1.07*  CALCIUM 8.0* 7.4*   Liver Function Tests:  Recent Labs Lab 05/18/15 1736 05/19/15 0916  AST 16 14*  ALT 8* 7*  ALKPHOS 51 45  BILITOT 0.3 0.6  PROT 6.4* 5.6*  ALBUMIN 2.6* 2.3*    Recent Labs Lab 05/18/15 1756 05/19/15 0916  LIPASE 141* 94*   No results for input(s): AMMONIA in the last  168 hours. CBC:  Recent Labs Lab 05/18/15 1736 05/19/15 0923  WBC 6.3 4.8  NEUTROABS 4.2  --   HGB 9.6* 8.4*  HCT 28.8* 25.6*  MCV 85.5 85.6  PLT 420* 367   Cardiac Enzymes:  Recent Labs Lab 05/18/15 2146 05/19/15 0916  TROPONINI <0.03 <0.03   BNP (last 3 results) No results for input(s): BNP in the last 8760 hours.  ProBNP (last 3 results) No results for input(s): PROBNP in the last 8760 hours.  CBG: No results for  input(s): GLUCAP in the last 168 hours.  Recent Results (from the past 240 hour(s))  Blood Culture (routine x 2)     Status: None (Preliminary result)   Collection Time: 05/18/15  5:36 PM  Result Value Ref Range Status   Specimen Description BLOOD LEFT ARM  Final   Special Requests BOTTLES DRAWN AEROBIC AND ANAEROBIC [place]  Final   Culture NO GROWTH < 24 HOURS  Final   Report Status PENDING  Incomplete  Blood Culture (routine x 2)     Status: None (Preliminary result)   Collection Time: 05/18/15  5:50 PM  Result Value Ref Range Status   Specimen Description BLOOD RIGHT ARM  Final   Special Requests BOTTLES DRAWN AEROBIC AND ANAEROBIC 8CC  Final   Culture NO GROWTH < 24 HOURS  Final   Report Status PENDING  Incomplete  MRSA PCR Screening     Status: None   Collection Time: 05/18/15 11:14 PM  Result Value Ref Range Status   MRSA by PCR NEGATIVE NEGATIVE Final    Comment:        The GeneXpert MRSA Assay (FDA approved for NASAL specimens only), is one component of a comprehensive MRSA colonization surveillance program. It is not intended to diagnose MRSA infection nor to guide or monitor treatment for MRSA infections.      Studies: Ct Head Wo Contrast  05/18/2015   CLINICAL DATA:  Altered mental status today.  EXAM: CT HEAD WITHOUT CONTRAST  TECHNIQUE: Contiguous axial images were obtained from the base of the skull through the vertex without intravenous contrast.  COMPARISON:  10/28/2012  FINDINGS: No mass effect or midline shift. No evidence of acute intracranial hemorrhage, or infarction. No abnormal extra-axial fluid collections. Gray-white matter differentiation is normal. Basal cisterns are preserved. There is atrophy and chronic small vessel disease changes. Left cerebellar punctate hypoattenuation likely represents an old lacunar infarct.  No depressed skull fractures. Right sphenoid sinus is nearly completely opacified with air-fluid level accounted dependently. Visualized  paranasal sinuses and mastoid air cells are otherwise not opacified.  IMPRESSION: No acute intracranial abnormality.  Atrophy, chronic microvascular disease.  Probable old left cerebellar lacunar infarct.  Right sphenoid sinusitis. The high density material within the sphenoid sinus raise a concern of possible fungal sinusitis.   Electronically Signed   By: Fidela Salisbury M.D.   On: 05/18/2015 18:40   Ct Chest Wo Contrast  05/18/2015   CLINICAL DATA:  81 year old with cough, weakness and shortness of breath. Initial encounter.  EXAM: CT CHEST WITHOUT CONTRAST  TECHNIQUE: Multidetector CT imaging of the chest was performed following the standard protocol without IV contrast.  COMPARISON:  Radiographs 05/18/2015.  Abdominal CT 05/03/2015.  FINDINGS: Mediastinum/Nodes: There are no enlarged mediastinal, hilar or axillary lymph nodes.Right axillary surgical clips are noted. The thyroid gland, trachea and esophagus demonstrate no significant findings. The heart is mildly enlarged and there is a small pericardial effusion. There is relatively mild atherosclerosis of the aorta,  great vessels and coronary arteries for age.  Lungs/Pleura: There is trace pleural fluid on the left. The lungs demonstrate diffuse central airway thickening with increased dependent opacity in the left lower lobe which may reflect atelectasis or a developing infiltrate.  Upper abdomen: Stable mild edematous changes. No acute findings demonstrated.  Musculoskeletal/Chest wall: There is no chest wall mass or suspicious osseous finding. There is a mild superior endplate compression deformity at T5, age indeterminate. Chronic appearing inferior endplate Schmorl's node at T12 is unchanged from recent abdominal CT.  IMPRESSION: 1. New small left pleural effusion with increased left lower lobe atelectasis or early infiltrate. No consolidation. 2. Underlying chronic central airway thickening. 3. Mild atherosclerosis. 4. No evidence of metastatic  breast cancer. 5. Age indeterminate mild T5 compression deformity.   Electronically Signed   By: Richardean Sale M.D.   On: 05/18/2015 20:54   US Venous Img Lower Bilateral  05/19/2015   CLINICAL DATA:  Bilateral ankle edema  EXAM: BILATERAL LOWER EXTREMITY VENOUS DOPPLER ULTRASOUND  TECHNIQUE: Gray-scale sonography with graded compression, as well as color Doppler and duplex ultrasound were performed to evaluate the lower extremity deep venous systems from the level of the common femoral vein and including the common femoral, femoral, profunda femoral, popliteal and calf veins including the posterior tibial, peroneal and gastrocnemius veins when visible. The superficial great saphenous vein was also interrogated. Spectral Doppler was utilized to evaluate flow at rest and with distal augmentation maneuvers in the common femoral, femoral and popliteal veins.  COMPARISON:  None.  FINDINGS: RIGHT LOWER EXTREMITY  Common Femoral Vein: No evidence of thrombus. Normal compressibility, respiratory phasicity and response to augmentation.  Saphenofemoral Junction: No evidence of thrombus. Normal compressibility and flow on color Doppler imaging.  Profunda Femoral Vein: No evidence of thrombus. Normal compressibility and flow on color Doppler imaging.  Femoral Vein: No evidence of thrombus. Normal compressibility, respiratory phasicity and response to augmentation.  Popliteal Vein: No evidence of thrombus. Normal compressibility, respiratory phasicity and response to augmentation.  Calf Veins: Imaging is limited below the knee due to edema in the calf veins are not well evaluated.  LEFT LOWER EXTREMITY  Common Femoral Vein: No evidence of thrombus. Normal compressibility, respiratory phasicity and response to augmentation.  Saphenofemoral Junction: No evidence of thrombus. Normal compressibility and flow on color Doppler imaging.  Profunda Femoral Vein: No evidence of thrombus. Normal compressibility and flow on color  Doppler imaging.  Femoral Vein: No evidence of thrombus. Normal compressibility, respiratory phasicity and response to augmentation.  Popliteal Vein: No evidence of thrombus. Normal compressibility, respiratory phasicity and response to augmentation.  Calf Veins: Imaging is limited below the knee due to edema in the calf veins are not well evaluated.  IMPRESSION: Limited evaluation of the bilateral calf veins. No evidence deep venous thrombosis. Bilateral lower extremity/ ankle edema identified.   Electronically Signed   By: Skipper Cliche M.D.   On: 05/19/2015 11:16   Dg Chest Port 1 View  05/18/2015   CLINICAL DATA:  Lethargy with altered mental status today. History of hypertension and breast cancer. Initial encounter.  EXAM: PORTABLE CHEST - 1 VIEW  COMPARISON:  Abdominal CT 05/03/2015.  FINDINGS: 1754 hours. Patient is rotated to the right. Cardiomegaly and aortic ectasia are similar to that demonstrated on prior abdominal CT. There is mild chronic atelectasis at both lung bases. No confluent airspace opacity, edema or significant pleural effusion. Patient is status post right mastectomy and axillary node dissection. Degenerative changes are present  at both shoulders. No acute osseous findings seen.  IMPRESSION: No acute cardiopulmonary process. Cardiomegaly and chronic bibasilar atelectasis.   Electronically Signed   By: Richardean Sale M.D.   On: 05/18/2015 18:13    Scheduled Meds: . cefTAZidime (FORTAZ)  IV  2 g Intravenous Q24H  . enoxaparin (LOVENOX) injection  30 mg Subcutaneous Q24H  . fluticasone  1 spray Each Nare Daily  . guaiFENesin  600 mg Oral BID  . ipratropium-albuterol  3 mL Nebulization TID  . levothyroxine  137 mcg Oral QAC breakfast  . loratadine  10 mg Oral Daily  . olopatadine  1 drop Both Eyes BID  . [START ON 05/21/2015] vancomycin  1,000 mg Intravenous Q48H   Continuous Infusions: . sodium chloride 70 mL/hr at 05/19/15 0930    Principal Problem:      Time spent:  35 minutes    [facility] Pager [phone]. If 7PM-7AM, please contact night-coverage at [email or web address], password [facility] 05/19/2015, 1:23 PM  LOS: 1 day

## 2015-05-19 NOTE — Clinical Social Work Note (Signed)
Clinical Social Work Assessment  Patient Details  Name: Jo Hardin MRN: [id number] Date of Birth: [birth year]  Date of referral:  05/19/15               Reason for consult:  Facility Placement                Permission sought to share information with:    Permission granted to share information::     Name::        Agency::     Relationship::     Contact Information:     Housing/Transportation Living arrangements for the past 2 months:  Single Family Home Source of Information:  Adult Children Patient Interpreter Needed:  None Criminal Activity/Legal Involvement Pertinent to Current Situation/Hospitalization:  No - Comment as needed Significant Relationships:  Adult Children Lives with:  Facility Resident Do you feel safe going back to the place where you live?  Yes Need for family participation in patient care:  Yes (Comment)  Care giving concerns: Facility resident.    Social Worker assessment / plan:  Patient's daughter, [name], and multiple friends and family were at bedside and in the waiting room.  Ms. Deloria Lair provided the history.  She advised that patient has been at [facility] for the past two weeks.  She stated that prior to patient going to [facility], she was hospitalized. Ms. Deloria Lair advised that patient lives with her and she is just at [facility] for two weeks.  She stated that at baseline patient uses cane but since being in [facility] has been using a walker.  She advised that she desired for patient to return to [facility] to complete rehab and upon completing rehab come back to her home.  CSW spoke with Jackelyn Poling at [facility]. Debbie confirmed Ms. Craddock's statements. She advised that patient can return to the facility upon discharge.    Employment status:  Retired Forensic scientist:  Medicare PT Recommendations:  Not assessed at this time Information / Referral to community resources:     Patient/Family's Response to care:  Patient's family is agreeable for patient to  return to SNF upon discharge to complete rehab.   Patient/Family's Understanding of and Emotional Response to Diagnosis, Current Treatment, and Prognosis:  Patient's family understands patient diagnosis, treatment and prognosis.   Emotional Assessment Appearance:  Developmentally appropriate Attitude/Demeanor/Rapport:   (Patient did not talk during assessment.  ) Affect (typically observed):  Calm Orientation:   (Patient did not talk during assessment. ) Alcohol / Substance use:  Not Applicable Psych involvement (Current and /or in the community):  No (Comment)  Discharge Needs  Concerns to be addressed:  Discharge Planning Concerns Readmission within the last 30 days:  Yes Current discharge risk:  None Barriers to Discharge:  No Barriers Identified   Ihor Gully, LCSW 05/19/2015, 12:08 PM  [phone]

## 2015-05-19 NOTE — Progress Notes (Signed)
Bolus is complete. Pt BP 93/46 with a HR of 50. Dr.Le made aware

## 2015-05-19 NOTE — Progress Notes (Signed)
Pt BP this morning is 92/51 with a HR of 51. Pt is asymptomatic. Spoke with Dr.Le who ordered a 300 cc bolus of normal saline since pt is asymptomatic. Will continue to monitor

## 2015-05-19 NOTE — Care Management Note (Signed)
Case Management Note  Patient Details  Name: Jo Hardin MRN: [id number] Date of Birth: [birth year]  Subjective/Objective:                  Pt admitted from [facility] with UTI/acute encephalopathy. Anticipate discharge back to facility when medically stable.  Action/Plan: CSW is aware and will arrange discharge to facility when medically stable.  Expected Discharge Date:                  Expected Discharge Plan:  Skilled Nursing Facility  In-House Referral:  Clinical Social Work  Discharge planning Services  CM Consult  Post Acute Care Choice:  NA Choice offered to:  NA  DME Arranged:    DME Agency:     HH Arranged:    [place] Agency:     Status of Service:  Completed, signed off  Medicare Important Message Given:    Date Medicare IM Given:    Medicare IM give by:    Date Additional Medicare IM Given:    Additional Medicare Important Message give by:     If discussed at [place] of Stay Meetings, dates discussed:    Additional Comments:  Joylene Draft, RN 05/19/2015, 12:39 PM

## 2015-05-19 NOTE — Progress Notes (Signed)
Notified Dr Caryn Section via text that we are unable to get urine due to Pt being incontinent of urine.

## 2015-05-19 NOTE — Consult Note (Signed)
CARDIOLOGY CONSULT NOTE   Patient ID: Jo Hardin MRN: [id number] DOB/AGE: [birth year] 81 y.o.  Admit Date: 05/18/2015 Referring Physician: PTH-Fisher, Langley Gauss  Primary Physician: No primary care provider on file. Consulting Cardiologist: Dorris Carnes MD Primary Cardiologist:  Reason for Consultation: Abnormal EKG, Bradycardia and hypotension   Clinical Summary Jo Hardin is a 81 y.o.female with known history of hypertension, arthritis, hypothyroidism, breast cancer s/p lumpectomy. She has recent admission for pancreatitis and was placed at SNF for rehab  Per her daughter she has been alert.  No complaints until yesterday  Son went to see her at 27 AM  Was sluggish  In chair  Still answered questions.  By 2:30 when daughter came she was more  lethargic and unresponsive, and EMS was called. Per H&P she only responded to sternal rub.   Daughter states that pt is normally active, had been participating in rehab at [facility], eating well. Her daughter states that she has had a congested cough since discharge for pancreatitis.  Not productive  No fevers  She also states she has had some LEE over the last couple of days prior to coming in.   On arrival to ER BP 77/47, HR 51, O2 Sat 95%, She was afebrile. Lipase was elevated but improved from prior hospitalization. Na 134, K+4.9. Creatinine 1.47.  Marland Kitchen TSH elevated at 11.478. CT of chest New small left pleural effusion with increased left lower lobe atelectasis or early infiltrate. No consolidation, with otherwise normal findings.CT of head negative for acute abnormality.        Allergies  Allergen Reactions  . Codeine Nausea And Vomiting  . Nsaids     Medications Scheduled Medications: . cefTAZidime (FORTAZ)  IV  2 g Intravenous Q24H  . enoxaparin (LOVENOX) injection  30 mg Subcutaneous Q24H  . fluticasone  1 spray Each Nare Daily  . guaiFENesin  600 mg Oral BID  . ipratropium-albuterol  3 mL Nebulization TID  . levothyroxine  137 mcg Oral QAC  breakfast  . loratadine  10 mg Oral Daily  . olopatadine  1 drop Both Eyes BID  . vancomycin  1,250 mg Intravenous Once  . [START ON 05/21/2015] vancomycin  1,000 mg Intravenous Q48H    Infusions: . sodium chloride 70 mL/hr at 05/19/15 0930    PRN Medications: albuterol   Past Medical History  Diagnosis Date  . Arthritis   . Thyroid disease   . Cancer     had breast cancer 8 yrs ago  . Hypertension   . Hypomagnesemia   . GERD (gastroesophageal reflux disease)   . Pancreatitis   . Osteoarthritis   . Hypokalemia   . Pancreatitis, acute 05/03/2015  . GAVE (gastric antral vascular ectasia) 05/03/15    per EGD    Past Surgical History  Procedure Laterality Date  . Breast surgery    . Esophagogastroduodenoscopy N/A 05/07/2015    Procedure: ESOPHAGOGASTRODUODENOSCOPY (EGD);  Surgeon: Rogene Houston, MD;  Location: [facility];  Service: Endoscopy;  Laterality: N/A;    No family history on file.  Social History Jo Hardin reports that she has never smoked. She does not have any smokeless tobacco history on file. Jo Hardin reports that she does not drink alcohol.  Review of Systems Complete review of systems are found to be negative unless outlined in H&P above.  Physical Examination Blood pressure 98/54, pulse 54, temperature 98.3 F (36.8 C), temperature source Oral, resp. rate 18, height 4\' 11"  (1.499 m), weight 135 lb  5.8 oz (61.4 kg), SpO2 98 %.  Intake/Output Summary (Last 24 hours) at 05/19/15 1037 Last data filed at 05/19/15 0850  Gross per 24 hour  Intake   2300 ml  Output      0 ml  Net   2300 ml    Telemetry: Sinus bradycardia rates in the 40's and 50's.   GEN: Lethargic but opens eyes and is responsive.  HEENT: Conjunctiva normal  LIds puffy , oropharynx clear with moist mucosa. Neck: Supple, no elevated JVP or carotid bruits, no thyromegaly. Lungs: Upper airway congestion with coughing, poor respiratory effort.  Cardiac: Regular rate and rhythm,  distant,Gr I-II/VI systolic murmur LSB , no pericardial rub. Abdomen: Soft, nontender, no hepatomegaly, bowel sounds present, no guarding or rebound. Extremities: No  edema, distal pulses 2+. Skin: Warm and dry. Musculoskeletal: No kyphosis. Neuropsychiatric: Awakens to verbal stimuli, answers Oriented to person, but no place or time. Able to tell me her name and DOB.Marland Kitchen  Answers some questions appropriately    Prior Cardiac Testing/Procedures; None  Echo is pending.  EKG  SB  51 bpm  LBBB>  Signif T wave inversion through most leads  No old to compare  Lab Results  Basic Metabolic Panel:  Recent Labs Lab 05/18/15 1736 05/19/15 0916  NA 134* 135  K 4.9 4.4  CL 102 108  CO2 25 22  GLUCOSE 127* 98  BUN 11 13  CREATININE 1.47* 1.07*  CALCIUM 8.0* 7.4*    Liver Function Tests:  Recent Labs Lab 05/18/15 1736 05/19/15 0916  AST 16 14*  ALT 8* 7*  ALKPHOS 51 45  BILITOT 0.3 0.6  PROT 6.4* 5.6*  ALBUMIN 2.6* 2.3*    CBC:  Recent Labs Lab 05/18/15 1736 05/19/15 0923  WBC 6.3 4.8  NEUTROABS 4.2  --   HGB 9.6* 8.4*  HCT 28.8* 25.6*  MCV 85.5 85.6  PLT 420* 367    Cardiac Enzymes:  Recent Labs Lab 05/18/15 2146 05/19/15 0916  TROPONINI <0.03 <0.03    Radiology: Ct Head Wo Contrast  05/18/2015   CLINICAL DATA:  Altered mental status today.  EXAM: CT HEAD WITHOUT CONTRAST  TECHNIQUE: Contiguous axial images were obtained from the base of the skull through the vertex without intravenous contrast.  COMPARISON:  10/28/2012  FINDINGS: No mass effect or midline shift. No evidence of acute intracranial hemorrhage, or infarction. No abnormal extra-axial fluid collections. Gray-white matter differentiation is normal. Basal cisterns are preserved. There is atrophy and chronic small vessel disease changes. Left cerebellar punctate hypoattenuation likely represents an old lacunar infarct.  No depressed skull fractures. Right sphenoid sinus is nearly completely opacified with  air-fluid level accounted dependently. Visualized paranasal sinuses and mastoid air cells are otherwise not opacified.  IMPRESSION: No acute intracranial abnormality.  Atrophy, chronic microvascular disease.  Probable old left cerebellar lacunar infarct.  Right sphenoid sinusitis. The high density material within the sphenoid sinus raise a concern of possible fungal sinusitis.   Electronically Signed   By: Fidela Salisbury M.D.   On: 05/18/2015 18:40   Ct Chest Wo Contrast  05/18/2015   CLINICAL DATA:  81 year old with cough, weakness and shortness of breath. Initial encounter.  EXAM: CT CHEST WITHOUT CONTRAST  TECHNIQUE: Multidetector CT imaging of the chest was performed following the standard protocol without IV contrast.  COMPARISON:  Radiographs 05/18/2015.  Abdominal CT 05/03/2015.  FINDINGS: Mediastinum/Nodes: There are no enlarged mediastinal, hilar or axillary lymph nodes.Right axillary surgical clips are noted. The thyroid gland, trachea  and esophagus demonstrate no significant findings. The heart is mildly enlarged and there is a small pericardial effusion. There is relatively mild atherosclerosis of the aorta, great vessels and coronary arteries for age.  Lungs/Pleura: There is trace pleural fluid on the left. The lungs demonstrate diffuse central airway thickening with increased dependent opacity in the left lower lobe which may reflect atelectasis or a developing infiltrate.  Upper abdomen: Stable mild edematous changes. No acute findings demonstrated.  Musculoskeletal/Chest wall: There is no chest wall mass or suspicious osseous finding. There is a mild superior endplate compression deformity at T5, age indeterminate. Chronic appearing inferior endplate Schmorl's node at T12 is unchanged from recent abdominal CT.  IMPRESSION: 1. New small left pleural effusion with increased left lower lobe atelectasis or early infiltrate. No consolidation. 2. Underlying chronic central airway thickening. 3.  Mild atherosclerosis. 4. No evidence of metastatic breast cancer. 5. Age indeterminate mild T5 compression deformity.   Electronically Signed   By: Richardean Sale M.D.   On: 05/18/2015 20:54   Dg Chest Port 1 View  05/18/2015   CLINICAL DATA:  Lethargy with altered mental status today. History of hypertension and breast cancer. Initial encounter.  EXAM: PORTABLE CHEST - 1 VIEW  COMPARISON:  Abdominal CT 05/03/2015.  FINDINGS: 1754 hours. Patient is rotated to the right. Cardiomegaly and aortic ectasia are similar to that demonstrated on prior abdominal CT. There is mild chronic atelectasis at both lung bases. No confluent airspace opacity, edema or significant pleural effusion. Patient is status post right mastectomy and axillary node dissection. Degenerative changes are present at both shoulders. No acute osseous findings seen.  IMPRESSION: No acute cardiopulmonary process. Cardiomegaly and chronic bibasilar atelectasis.   Electronically Signed   By: Richardean Sale M.D.   On: 05/18/2015 18:13     ECG: SB 51 bpm  with LBBB bradycardic.   Impression and Recommendations  1. Acute unresponsiveness:  Etiology is not clear.  Pt had pulse  Was hypotensive, sats OK    Daughter says that this has never happened before   ? There is concern taht she may have inadvertantly got another medince.  She is improving  Answers questions  Says she feels better.  I would continue telemetry.  Get echo   2. Cardiac:  EKG with bradycardia, conduction delay, T wave changes  No old to compare  No signif pauses  Would follow  Get echo Replete thyroid    3.  Hypothyroidism: TSH is elevated, potentially contributing to bradycardia,  Repleted  5. Hypotension: IV fluids were given and losartan is on hold currently.   I would not plan other testing for now  Will f/u with results    Signed: Phill Myron. Lawrence NP [place]  05/19/2015, 10:37 AM   Pt seen and examined  I have amended note above to reflect my findings.  Will  follow .  Co-Sign MD

## 2015-05-20 LAB — CBC
HCT: 26.4 % — ABNORMAL LOW (ref 36.0–46.0)
Hemoglobin: 8.7 g/dL — ABNORMAL LOW (ref 12.0–15.0)
MCH: 28.3 pg (ref 26.0–34.0)
MCHC: 33 g/dL (ref 30.0–36.0)
MCV: 86 fL (ref 78.0–100.0)
PLATELETS: 432 10*3/uL — AB (ref 150–400)
RBC: 3.07 MIL/uL — ABNORMAL LOW (ref 3.87–5.11)
RDW: 16.2 % — AB (ref 11.5–15.5)
WBC: 5.2 10*3/uL (ref 4.0–10.5)

## 2015-05-20 LAB — BASIC METABOLIC PANEL
ANION GAP: 4 — AB (ref 5–15)
BUN: 13 mg/dL (ref 6–20)
CALCIUM: 7.5 mg/dL — AB (ref 8.9–10.3)
CO2: 22 mmol/L (ref 22–32)
CREATININE: 0.88 mg/dL (ref 0.44–1.00)
Chloride: 110 mmol/L (ref 101–111)
GFR calc Af Amer: >60 mL/min (ref >60)
GFR, EST NON AFRICAN AMERICAN: 56 mL/min — AB (ref >60)
GLUCOSE: 112 mg/dL — AB (ref 65–99)
Potassium: 4.3 mmol/L (ref 3.5–5.1)
Sodium: 136 mmol/L (ref 135–145)

## 2015-05-20 LAB — URINE CULTURE

## 2015-05-20 LAB — STREP PNEUMONIAE URINARY ANTIGEN: STREP PNEUMO URINARY ANTIGEN: NEGATIVE

## 2015-05-20 MED ORDER — DEXTROSE 5 % IV SOLN
1.0000 g | Freq: Two times a day (BID) | INTRAVENOUS | Status: DC
  Administered 2015-05-20 – 2015-05-22 (×4): 1 g via INTRAVENOUS
  Filled 2015-05-20 (×6): qty 1

## 2015-05-20 MED ORDER — ENOXAPARIN SODIUM 40 MG/0.4ML ~~LOC~~ SOLN
40.0000 mg | Freq: Every day | SUBCUTANEOUS | Status: DC
  Administered 2015-05-20 – 2015-05-21 (×2): 40 mg via SUBCUTANEOUS
  Filled 2015-05-20 (×2): qty 0.4

## 2015-05-20 MED ORDER — VANCOMYCIN HCL IN DEXTROSE 750-5 MG/150ML-% IV SOLN
750.0000 mg | INTRAVENOUS | Status: DC
  Administered 2015-05-20 – 2015-05-21 (×2): 750 mg via INTRAVENOUS
  Filled 2015-05-20 (×3): qty 150

## 2015-05-20 NOTE — Evaluation (Signed)
Physical Therapy Evaluation Patient Details Name: Jo Hardin MRN: [id number] DOB: [birth year] Today's Date: 05/20/2015   History of Present Illness  With h/o breast cancer s/p lumpectomy and adjuvant chemo for about a year, h/o hypothyroidism, htn, hld, arthritis was brought to [facility] ED by EMS due to above complaints. Patient was recently discharged to SNF after being admitted for pancreatitis. Per family patient was normally talkative and alert, patient became more lethargic and only respond to sternal rub when EMS arrived. Family reported patient has been having nonproductive cough, no fever, no complaints of chest pain, family reported that patient has been made npo for the past two days due to elevation of lipase and was given ivf in the SNF. Family also noticed that patient has developed bilateral ankle edema in the past few days.  At SNF pt had improved with overall mobility and was able to ambulate 10-15' with a walker.  Clinical Impression   Pt was seen for evaluation.  She was seen by this therapist 2 weeks ago during her last admission.  Her knees are significantly improved since her last admission with at least 30 degrees of ROM bilaterally.  She lost some of her gains that she made at [facility] and now needs max assist to transfer to standing with a walker.  She was unable to fully stand with a walker even after 4 tries.  She was able to transfer bed to chair with therapist assist needing mod assist.      Follow Up Recommendations SNF    Equipment Recommendations  None recommended by PT    Recommendations for Other Services   OT    Precautions / Restrictions Precautions Precautions: Fall Restrictions Weight Bearing Restrictions: No      Mobility  Bed Mobility Overal bed mobility: Needs Assistance Bed Mobility: Supine to Sit     Supine to sit: Mod assist;HOB elevated        Transfers Overall transfer level: Needs assistance Equipment used: Rolling walker (2  wheeled) Transfers: Stand Pivot Transfers Sit to Stand: Max assist Stand pivot transfers: Mod assist       General transfer comment: pt was not able to fully achieve stance before needing to sit back down...this was done 4 repetitions  Ambulation/Gait             General Gait Details: unable to ambulate at this time                       Balance Overall balance assessment: No apparent balance deficits (not formally assessed)                                           Pertinent Vitals/Pain Pain Assessment: No/denies pain    Home Living Family/patient expects to be discharged to:: Skilled nursing facility Living Arrangements: Children Available Help at Discharge: Family;Available 24 hours/day Type of Home: House       Home Layout: One level Home Equipment: Cane - single point;Bedside commode      Prior Function Level of Independence: Needs assistance   Gait / Transfers Assistance Needed: pt has been at [facility] for the past 2 weeks and got to the point where she could ambulate 10-15' with a walker.  ADL's / Homemaking Assistance Needed: needs assist with bathing and dressing        Hand Dominance   Dominant Hand:  Right    Extremity/Trunk Assessment               Lower Extremity Assessment: Generalized weakness RLE Deficits / Details: severe OA of both knees with extension to -15 degrees, flexion to 45 degrees LLE Deficits / Details: severe OA of knee with extension to -15 degrees, flexion to 45 degrees  Cervical / Trunk Assessment: Kyphotic  Communication   Communication: No difficulties  Cognition Arousal/Alertness: Awake/alert Behavior During Therapy: WFL for tasks assessed/performed Overall Cognitive Status: Within Functional Limits for tasks assessed                            Exercises General Exercises - Lower Extremity Ankle Circles/Pumps: AROM;Both;10 reps;Supine Short Arc Quad: AAROM;Both;10  reps;Supine Heel Slides: AAROM;Both;10 reps;Supine Hip ABduction/ADduction: AAROM;Both;10 reps;Supine      Assessment/Plan    PT Assessment Patient needs continued PT services  PT Diagnosis Difficulty walking;Generalized weakness   PT Problem List Decreased strength;Decreased range of motion;Decreased activity tolerance;Decreased mobility;Cardiopulmonary status limiting activity;Obesity  PT Treatment Interventions Gait training;Functional mobility training;Therapeutic exercise   PT Goals (Current goals can be found in the Care Plan section) Acute Rehab PT Goals Patient Stated Goal: none stated PT Goal Formulation: With patient/family Time For Goal Achievement: 06/03/15 Potential to Achieve Goals: Fair    Frequency Min 3X/week   Barriers to discharge   none                   End of Session Equipment Utilized During Treatment: Gait belt Activity Tolerance: Patient tolerated treatment well Patient left: in chair;with call bell/phone within reach;with family/visitor present           Time: 5643-3295 PT Time Calculation (min) (ACUTE ONLY): 43 min   Charges:   PT Evaluation $Initial PT Evaluation Tier I: 1 Procedure PT Treatments $Therapeutic Exercise: 8-22 mins   PT G CodesSable Hardin  PT 05/20/2015, 1:38 PM [phone]

## 2015-05-20 NOTE — Progress Notes (Signed)
ANTIBIOTIC CONSULT NOTE - FOLLOW UP  Pharmacy Consult for Vancomycin and Fortaz  Indication: pneumonia  Allergies  Allergen Reactions  . Codeine Nausea And Vomiting  . Nsaids    Patient Measurements: Height: 4\' 11"  (149.9 cm) Weight: 135 lb 5.8 oz (61.4 kg) IBW/kg (Calculated) : 43.2  Vital Signs: Temp: 98.7 F (37.1 C) (09/20 0644) Temp Source: Oral (09/20 0644) BP: 118/58 mmHg (09/20 0644) Pulse Rate: 59 (09/20 0644) Intake/Output from previous day: 09/19 0701 - 09/20 0700 In: 360 [P.O.:360] Out: -  Intake/Output from this shift: Total I/O In: 120 [P.O.:120] Out: -   Labs:  Recent Labs  05/18/15 1736 05/19/15 0916 05/19/15 0923 05/20/15 0700  WBC 6.3  --  4.8 5.2  HGB 9.6*  --  8.4* 8.7*  PLT 420*  --  367 432*  CREATININE 1.47* 1.07*  --  0.88   Estimated Creatinine Clearance: 33.2 mL/min (by C-G formula based on Cr of 0.88). No results for input(s): VANCOTROUGH, VANCOPEAK, VANCORANDOM, GENTTROUGH, GENTPEAK, GENTRANDOM, TOBRATROUGH, TOBRAPEAK, TOBRARND, AMIKACINPEAK, AMIKACINTROU, AMIKACIN in the last 72 hours.   Microbiology: Recent Results (from the past 720 hour(s))  Blood Culture (routine x 2)     Status: None (Preliminary result)   Collection Time: 05/18/15  5:36 PM  Result Value Ref Range Status   Specimen Description BLOOD LEFT ARM DRAWN BY RN  Final   Special Requests BOTTLES DRAWN AEROBIC AND ANAEROBIC [place]  Final   Culture NO GROWTH 2 DAYS  Final   Report Status PENDING  Incomplete  Blood Culture (routine x 2)     Status: None (Preliminary result)   Collection Time: 05/18/15  5:50 PM  Result Value Ref Range Status   Specimen Description BLOOD RIGHT ARM  Final   Special Requests BOTTLES DRAWN AEROBIC AND ANAEROBIC 8CC  Final   Culture NO GROWTH 2 DAYS  Final   Report Status PENDING  Incomplete  Urine culture     Status: None   Collection Time: 05/18/15  6:32 PM  Result Value Ref Range Status   Specimen Description URINE, CLEAN CATCH  Final    Special Requests NONE  Final   Culture   Final    MULTIPLE SPECIES PRESENT, SUGGEST RECOLLECTION Performed at [facility]    Report Status 05/20/2015 FINAL  Final  MRSA PCR Screening     Status: None   Collection Time: 05/18/15 11:14 PM  Result Value Ref Range Status   MRSA by PCR NEGATIVE NEGATIVE Final    Comment:        The GeneXpert MRSA Assay (FDA approved for NASAL specimens only), is one component of a comprehensive MRSA colonization surveillance program. It is not intended to diagnose MRSA infection nor to guide or monitor treatment for MRSA infections.     Anti-infectives    Start     Dose/Rate Route Frequency Ordered Stop   05/21/15 0900  vancomycin (VANCOCIN) IVPB 1000 mg/200 mL premix  Status:  Discontinued     1,000 mg 200 mL/hr over 60 Minutes Intravenous Every 48 hours 05/19/15 0841 05/20/15 1045   05/20/15 2200  cefTAZidime (FORTAZ) 1 g in dextrose 5 % 50 mL IVPB     1 g 100 mL/hr over 30 Minutes Intravenous Every 12 hours 05/20/15 1045     05/20/15 1200  vancomycin (VANCOCIN) IVPB 750 mg/150 ml premix     750 mg 150 mL/hr over 60 Minutes Intravenous Every 24 hours 05/20/15 1045     05/19/15 0830  cefTAZidime (  FORTAZ) 2 g in dextrose 5 % 50 mL IVPB  Status:  Discontinued     2 g 100 mL/hr over 30 Minutes Intravenous Every 24 hours 05/19/15 0814 05/20/15 1045   05/19/15 0830  vancomycin (VANCOCIN) 1,250 mg in sodium chloride 0.9 % 250 mL IVPB     1,250 mg 166.7 mL/hr over 90 Minutes Intravenous  Once 05/19/15 0814 05/19/15 1312     Assessment: Okay for Protocol, renal function improved.  Goal of Therapy:  Vancomycin trough level 15-20 mcg/ml  Plan:  Change Fortaz to 1gm IV every 12 hours. Change Vancomycin to 750mg  IV every 24 hours. Measure antibiotic drug levels at steady state Follow up culture results  Jo Hardin 05/20/2015,10:46 AM

## 2015-05-20 NOTE — Progress Notes (Signed)
Patient ID: Jo Hardin, female   DOB: Sep 16, 1921, 81 y.o.   MRN: [id number]    Primary cardiologist:  Subjective:    No chest pain, no palpitations.   Objective:   Temp:  [98.1 F (36.7 C)-98.7 F (37.1 C)] 98.7 F (37.1 C) (09/20 0644) Pulse Rate:  [59-60] 59 (09/20 0644) Resp:  [18] 18 (09/20 0644) BP: (99-118)/(58-62) 118/58 mmHg (09/20 0644) SpO2:  [91 %-100 %] 98 % (09/20 0847) Last BM Date:  (UTA)  Filed Weights   05/18/15 1716 05/18/15 2203  Weight: 120 lb (54.432 kg) 135 lb 5.8 oz (61.4 kg)    Intake/Output Summary (Last 24 hours) at 05/20/15 0932 Last data filed at 05/20/15 0825  Gross per 24 hour  Intake    480 ml  Output      0 ml  Net    480 ml    Telemetry:SR 60-70s  Exam:  General: NAD  Resp: CTAB  Cardiac: RRR, no m/r/g, no jvd  GI: abdomen soft, NT, ND  MSK: no LE edema  Neuro: no focal deficits  Psych: appropriate affect  Lab Results:  Basic Metabolic Panel:  Recent Labs Lab 05/18/15 1736 05/19/15 0916 05/20/15 0700  NA 134* 135 136  K 4.9 4.4 4.3  CL 102 108 110  CO2 25 22 22   GLUCOSE 127* 98 112*  BUN 11 13 13   CREATININE 1.47* 1.07* 0.88  CALCIUM 8.0* 7.4* 7.5*    Liver Function Tests:  Recent Labs Lab 05/18/15 1736 05/19/15 0916  AST 16 14*  ALT 8* 7*  ALKPHOS 51 45  BILITOT 0.3 0.6  PROT 6.4* 5.6*  ALBUMIN 2.6* 2.3*    CBC:  Recent Labs Lab 05/18/15 1736 05/19/15 0923 05/20/15 0700  WBC 6.3 4.8 5.2  HGB 9.6* 8.4* 8.7*  HCT 28.8* 25.6* 26.4*  MCV 85.5 85.6 86.0  PLT 420* 367 432*    Cardiac Enzymes:  Recent Labs Lab 05/18/15 2146 05/19/15 0916  TROPONINI <0.03 <0.03    BNP: No results for input(s): PROBNP in the last 8760 hours.  Coagulation: No results for input(s): INR in the last 168 hours.  ECG:   Medications:   Scheduled Medications: . cefTAZidime (FORTAZ)  IV  2 g Intravenous Q24H  . enoxaparin (LOVENOX) injection  30 mg Subcutaneous Q24H  . ferrous sulfate  325 mg Oral Q  breakfast  . fluticasone  1 spray Each Nare Daily  . guaiFENesin  600 mg Oral BID  . ipratropium-albuterol  3 mL Nebulization TID  . levothyroxine  150 mcg Oral QAC breakfast  . loratadine  10 mg Oral Daily  . olopatadine  1 drop Both Eyes BID  . predniSONE  20 mg Oral BID WC  . [START ON 05/21/2015] vancomycin  1,000 mg Intravenous Q48H     Infusions: . sodium chloride 50 mL/hr at 05/19/15 1445     PRN Medications:  albuterol     Assessment/Plan   1. AMS - unclear etiology, did present hypotensive   2. Bradycardia - HR in 50s on admission. From notes there was some concern about the patient receiving a beta blocker at the nursing home.  - EKG sinus brady, diffuse TWIs. No evidence of block - fairly mild bradycardia, does not explain her presentation of AMS. BP responded with IVFs, was not responsible for her hypotension either.  - elevated TSH, synthroid increased by primary team.   3. Hypothyroidism - TSH 11.5, normal free T4  4. Hypotension - bp improved with IVFs,  Cr 1.5 to 0.9 with IVF suggesting prerenal AKI and hypovolemia as cause - echo LVEF 63-14%, grade I diastolic dysfunction. Trops neg.   5. Abnormal EKG - diffuse TWIs, no prior EKG to compare. No cardiac symptoms, troponin negative. Given her age, lack of symptoms, normal echo and negative troponins would not pursue further testing at this time.   No further inpatient cardiac needs, will sign off. She may f/u with NP Lawrence 3-4 weeks after discharge.   Carlyle Dolly, M.D

## 2015-05-20 NOTE — Progress Notes (Signed)
[facility] PROGRESS NOTE  Jo Hardin PPI:[id number] DOB: 03-19-1922 DOA: 05/18/2015 PCP: No primary care provider on file.  Assessment/Plan: Acute encephalopathy/unresponsive episode: much improved today.  maybe related to infectious process ie. Sinusitis and possible HCAP in setting of AKI. Troponin neg x2, EKG with SR LBBB, no events on tele,  echocardiogram results with mild LVH EF 60% and grade 1 diastolic dysfunction. CT head without acute abnormality. She remains afebrile and non-toxic appearing. Will obtain EEG for completeness. Will discontine IV fluids. continue antibiotics. Active Problems:  HCAP (healthcare-associated pneumonia): CT chest concerning for LLL atelectasis or infiltrate. She remains afebrile but BP improved. Strep pneumonia urine antigen and legionella urine antigen in process Vancomycin and ceftazidime day #2. Oxygen saturation level >90%. Will consider narrowing tomorrow   AKI (acute kidney injury): may be related to decreased po intake in setting of hypotension. Resolved with IV fluids. Continue to  hold nephrotoxins. monitor   Hypotension: etiology unclear. Improved. Holding losartan   Bradycardia: evaluated by cardiology who opine no significant pauses on tele or evidence of block. TSH elevated and synthroid increased.  Some question as to possibility that patient received another patient's medications while at facility as there is another elderly female named Cavalieri.    Hypothyroidism: TSH 11.4. Await Free T4 level. Synthroid increased   Iron deficiency anemia: chart review indicates hx of same. Anemia panel 2 weeks ago with iron level 6. EGD done 2 weeks ago revealed normal ampulla of vater and few gastric antral vascular ectasia without stigmata of bleed but otherwise showed no evidence of peptic ulcer disease. Advised to avoid NSAIDS. Continue PPI    Sphenoid sinusitis: antibiotics as noted above.   Abnormal EKG: evaluated by cardiology who  opine diffuse TWI's and given no symptoms, troponin negative, her age and normal echo would not pursue further testing.   Code Status: full Family Communication: daughter at bedside Disposition Plan: back to facility   Consultants:  cardiology  Procedures:  Echo Wall thickness was increased in a pattern of mild LVH. Systolic function was normal. The estimated ejection fraction was in the range of 60% to 65%. Doppler parameters are consistent with abnormal left ventricular relaxation (grade 1 diastolic dysfunction).  Antibiotics:  Vancomycin 05/19/15>>  ceftazidime 05/19/15>>    HPI/Subjective: Sitting in chair alert. Denies pain. Smiling and engaging  Objective: Filed Vitals:   05/20/15 1400  BP: 110/53  Pulse: 70  Temp: 98.4 F (36.9 C)  Resp: 16    Intake/Output Summary (Last 24 hours) at 05/20/15 1625 Last data filed at 05/20/15 1243  Gross per 24 hour  Intake    240 ml  Output      2 ml  Net    238 ml   Filed Weights   05/18/15 1716 05/18/15 2203  Weight: 54.432 kg (120 lb) 61.4 kg (135 lb 5.8 oz)    Exam:   General:  Obese somewhat frail but alert  Cardiovascular: rrr + murmur trace LE edema  Respiratory: normal effort BS with improved air flow no wheeze  Abdomen: non-distended non-tender +BS   Musculoskeletal: no clubbing or cyanosis   Data Reviewed: Basic Metabolic Panel:  Recent Labs Lab 05/18/15 1736 05/19/15 0916 05/20/15 0700  NA 134* 135 136  K 4.9 4.4 4.3  CL 102 108 110  CO2 25 22 22   GLUCOSE 127* 98 112*  BUN 11 13 13   CREATININE 1.47* 1.07* 0.88  CALCIUM 8.0* 7.4* 7.5*   Liver Function Tests:  Recent Labs Lab 05/18/15  1736 05/19/15 0916  AST 16 14*  ALT 8* 7*  ALKPHOS 51 45  BILITOT 0.3 0.6  PROT 6.4* 5.6*  ALBUMIN 2.6* 2.3*    Recent Labs Lab 05/18/15 1756 05/19/15 0916  LIPASE 141* 94*   No results for input(s): AMMONIA in the last 168 hours. CBC:  Recent Labs Lab 05/18/15 1736  05/19/15 0923 05/20/15 0700  WBC 6.3 4.8 5.2  NEUTROABS 4.2  --   --   HGB 9.6* 8.4* 8.7*  HCT 28.8* 25.6* 26.4*  MCV 85.5 85.6 86.0  PLT 420* 367 432*   Cardiac Enzymes:  Recent Labs Lab 05/18/15 2146 05/19/15 0916  TROPONINI <0.03 <0.03   BNP (last 3 results) No results for input(s): BNP in the last 8760 hours.  ProBNP (last 3 results) No results for input(s): PROBNP in the last 8760 hours.  CBG: No results for input(s): GLUCAP in the last 168 hours.  Recent Results (from the past 240 hour(s))  Blood Culture (routine x 2)     Status: None (Preliminary result)   Collection Time: 05/18/15  5:36 PM  Result Value Ref Range Status   Specimen Description BLOOD LEFT ARM DRAWN BY RN  Final   Special Requests BOTTLES DRAWN AEROBIC AND ANAEROBIC [place]  Final   Culture NO GROWTH 2 DAYS  Final   Report Status PENDING  Incomplete  Blood Culture (routine x 2)     Status: None (Preliminary result)   Collection Time: 05/18/15  5:50 PM  Result Value Ref Range Status   Specimen Description BLOOD RIGHT ARM  Final   Special Requests BOTTLES DRAWN AEROBIC AND ANAEROBIC 8CC  Final   Culture NO GROWTH 2 DAYS  Final   Report Status PENDING  Incomplete  Urine culture     Status: None   Collection Time: 05/18/15  6:32 PM  Result Value Ref Range Status   Specimen Description URINE, CLEAN CATCH  Final   Special Requests NONE  Final   Culture   Final    MULTIPLE SPECIES PRESENT, SUGGEST RECOLLECTION Performed at [facility]    Report Status 05/20/2015 FINAL  Final  MRSA PCR Screening     Status: None   Collection Time: 05/18/15 11:14 PM  Result Value Ref Range Status   MRSA by PCR NEGATIVE NEGATIVE Final    Comment:        The GeneXpert MRSA Assay (FDA approved for NASAL specimens only), is one component of a comprehensive MRSA colonization surveillance program. It is not intended to diagnose MRSA infection nor to guide or monitor treatment for MRSA infections.       Studies: Ct Head Wo Contrast  05/18/2015   CLINICAL DATA:  Altered mental status today.  EXAM: CT HEAD WITHOUT CONTRAST  TECHNIQUE: Contiguous axial images were obtained from the base of the skull through the vertex without intravenous contrast.  COMPARISON:  10/28/2012  FINDINGS: No mass effect or midline shift. No evidence of acute intracranial hemorrhage, or infarction. No abnormal extra-axial fluid collections. Gray-white matter differentiation is normal. Basal cisterns are preserved. There is atrophy and chronic small vessel disease changes. Left cerebellar punctate hypoattenuation likely represents an old lacunar infarct.  No depressed skull fractures. Right sphenoid sinus is nearly completely opacified with air-fluid level accounted dependently. Visualized paranasal sinuses and mastoid air cells are otherwise not opacified.  IMPRESSION: No acute intracranial abnormality.  Atrophy, chronic microvascular disease.  Probable old left cerebellar lacunar infarct.  Right sphenoid sinusitis. The high density material within  the sphenoid sinus raise a concern of possible fungal sinusitis.   Electronically Signed   By: Fidela Salisbury M.D.   On: 05/18/2015 18:40   Ct Chest Wo Contrast  05/18/2015   CLINICAL DATA:  81 year old with cough, weakness and shortness of breath. Initial encounter.  EXAM: CT CHEST WITHOUT CONTRAST  TECHNIQUE: Multidetector CT imaging of the chest was performed following the standard protocol without IV contrast.  COMPARISON:  Radiographs 05/18/2015.  Abdominal CT 05/03/2015.  FINDINGS: Mediastinum/Nodes: There are no enlarged mediastinal, hilar or axillary lymph nodes.Right axillary surgical clips are noted. The thyroid gland, trachea and esophagus demonstrate no significant findings. The heart is mildly enlarged and there is a small pericardial effusion. There is relatively mild atherosclerosis of the aorta, great vessels and coronary arteries for age.  Lungs/Pleura: There is  trace pleural fluid on the left. The lungs demonstrate diffuse central airway thickening with increased dependent opacity in the left lower lobe which may reflect atelectasis or a developing infiltrate.  Upper abdomen: Stable mild edematous changes. No acute findings demonstrated.  Musculoskeletal/Chest wall: There is no chest wall mass or suspicious osseous finding. There is a mild superior endplate compression deformity at T5, age indeterminate. Chronic appearing inferior endplate Schmorl's node at T12 is unchanged from recent abdominal CT.  IMPRESSION: 1. New small left pleural effusion with increased left lower lobe atelectasis or early infiltrate. No consolidation. 2. Underlying chronic central airway thickening. 3. Mild atherosclerosis. 4. No evidence of metastatic breast cancer. 5. Age indeterminate mild T5 compression deformity.   Electronically Signed   By: Richardean Sale M.D.   On: 05/18/2015 20:54   US Venous Img Lower Bilateral  05/19/2015   CLINICAL DATA:  Bilateral ankle edema  EXAM: BILATERAL LOWER EXTREMITY VENOUS DOPPLER ULTRASOUND  TECHNIQUE: Gray-scale sonography with graded compression, as well as color Doppler and duplex ultrasound were performed to evaluate the lower extremity deep venous systems from the level of the common femoral vein and including the common femoral, femoral, profunda femoral, popliteal and calf veins including the posterior tibial, peroneal and gastrocnemius veins when visible. The superficial great saphenous vein was also interrogated. Spectral Doppler was utilized to evaluate flow at rest and with distal augmentation maneuvers in the common femoral, femoral and popliteal veins.  COMPARISON:  None.  FINDINGS: RIGHT LOWER EXTREMITY  Common Femoral Vein: No evidence of thrombus. Normal compressibility, respiratory phasicity and response to augmentation.  Saphenofemoral Junction: No evidence of thrombus. Normal compressibility and flow on color Doppler imaging.  Profunda  Femoral Vein: No evidence of thrombus. Normal compressibility and flow on color Doppler imaging.  Femoral Vein: No evidence of thrombus. Normal compressibility, respiratory phasicity and response to augmentation.  Popliteal Vein: No evidence of thrombus. Normal compressibility, respiratory phasicity and response to augmentation.  Calf Veins: Imaging is limited below the knee due to edema in the calf veins are not well evaluated.  LEFT LOWER EXTREMITY  Common Femoral Vein: No evidence of thrombus. Normal compressibility, respiratory phasicity and response to augmentation.  Saphenofemoral Junction: No evidence of thrombus. Normal compressibility and flow on color Doppler imaging.  Profunda Femoral Vein: No evidence of thrombus. Normal compressibility and flow on color Doppler imaging.  Femoral Vein: No evidence of thrombus. Normal compressibility, respiratory phasicity and response to augmentation.  Popliteal Vein: No evidence of thrombus. Normal compressibility, respiratory phasicity and response to augmentation.  Calf Veins: Imaging is limited below the knee due to edema in the calf veins are not well evaluated.  IMPRESSION: Limited  evaluation of the bilateral calf veins. No evidence deep venous thrombosis. Bilateral lower extremity/ ankle edema identified.   Electronically Signed   By: Skipper Cliche M.D.   On: 05/19/2015 11:16   Dg Chest Port 1 View  05/18/2015   CLINICAL DATA:  Lethargy with altered mental status today. History of hypertension and breast cancer. Initial encounter.  EXAM: PORTABLE CHEST - 1 VIEW  COMPARISON:  Abdominal CT 05/03/2015.  FINDINGS: 1754 hours. Patient is rotated to the right. Cardiomegaly and aortic ectasia are similar to that demonstrated on prior abdominal CT. There is mild chronic atelectasis at both lung bases. No confluent airspace opacity, edema or significant pleural effusion. Patient is status post right mastectomy and axillary node dissection. Degenerative changes are  present at both shoulders. No acute osseous findings seen.  IMPRESSION: No acute cardiopulmonary process. Cardiomegaly and chronic bibasilar atelectasis.   Electronically Signed   By: Richardean Sale M.D.   On: 05/18/2015 18:13    Scheduled Meds: . cefTAZidime (FORTAZ)  IV  1 g Intravenous Q12H  . enoxaparin (LOVENOX) injection  40 mg Subcutaneous QHS  . ferrous sulfate  325 mg Oral Q breakfast  . fluticasone  1 spray Each Nare Daily  . guaiFENesin  600 mg Oral BID  . ipratropium-albuterol  3 mL Nebulization TID  . levothyroxine  150 mcg Oral QAC breakfast  . loratadine  10 mg Oral Daily  . olopatadine  1 drop Both Eyes BID  . predniSONE  20 mg Oral BID WC  . vancomycin  750 mg Intravenous Q24H   Continuous Infusions: . sodium chloride 50 mL/hr at 05/20/15 1008    Principal Problem:   Acute encephalopathy Active Problems:   Hypothyroidism   Iron deficiency anemia   Hypotension   Unresponsive episode   Sphenoid sinusitis   HCAP (healthcare-associated pneumonia)   AKI (acute kidney injury)   Bradycardia   Anemia, iron deficiency   Arterial hypotension    Time spent: 35 minutes    [facility] Pager [phone]. If 7PM-7AM, please contact night-coverage at [email or web address], password [facility] 05/20/2015, 4:25 PM  LOS: 2 days

## 2015-05-21 ENCOUNTER — Inpatient Hospital Stay (HOSPITAL_COMMUNITY)
Admit: 2015-05-21 | Discharge: 2015-05-21 | Disposition: A | Discharge status: Home / Self Care | Payer: Medicare Other | Attending: Internal Medicine | Admitting: Internal Medicine

## 2015-05-21 ENCOUNTER — Encounter (HOSPITAL_COMMUNITY): Payer: Self-pay | Admitting: Internal Medicine

## 2015-05-21 DIAGNOSIS — E038 Other specified hypothyroidism: Secondary | ICD-10-CM

## 2015-05-21 DIAGNOSIS — J323 Chronic sphenoidal sinusitis: Secondary | ICD-10-CM

## 2015-05-21 LAB — LIPASE, BLOOD: LIPASE: 75 U/L — AB (ref 22–51)

## 2015-05-21 LAB — BASIC METABOLIC PANEL
ANION GAP: 4 — AB (ref 5–15)
BUN: 12 mg/dL (ref 6–20)
CALCIUM: 7.8 mg/dL — AB (ref 8.9–10.3)
CO2: 23 mmol/L (ref 22–32)
Chloride: 107 mmol/L (ref 101–111)
Creatinine, Ser: 0.86 mg/dL (ref 0.44–1.00)
GFR, EST NON AFRICAN AMERICAN: 57 mL/min — AB (ref >60)
Glucose, Bld: 108 mg/dL — ABNORMAL HIGH (ref 65–99)
POTASSIUM: 3.7 mmol/L (ref 3.5–5.1)
SODIUM: 134 mmol/L — AB (ref 135–145)

## 2015-05-21 MED ORDER — PANTOPRAZOLE SODIUM 40 MG PO TBEC
40.0000 mg | DELAYED_RELEASE_TABLET | Freq: Every day | ORAL | Status: DC
  Administered 2015-05-21 – 2015-05-22 (×2): 40 mg via ORAL
  Filled 2015-05-21: qty 1

## 2015-05-21 NOTE — Clinical Social Work Note (Signed)
CSW met with patient and daughter, Ms. Jo Hardin, was at bedside.  Patient was alert.  Ms. Jo Hardin advised that after thinking about patient's overall care while at [facility], she did not desire for patient to return to [facility] at discharge. CSW discussed other options in the county as Ms. Craddock advised that she wanted to remain in the county.  Ms. Jo Hardin advised that she desired for patient's clinicals to be sent to [facility], [facility], [facility] and [place].    CSW faxed clinicals to the facilities discussed.    Settle, Heather D, LCSW. [phone]

## 2015-05-21 NOTE — Progress Notes (Signed)
[facility] PROGRESS NOTE  Jo Hardin OEU:[id number] DOB: Jan 12, 1922 DOA: 05/18/2015 PCP: No primary care provider on file.  Assessment/Plan: Acute encephalopathy/unresponsive episode: at baseline today. Etiology unclear. Maybe related to infectious process ie. Sinusitis and possible HCAP in setting of AKI. Troponin neg x2, EKG with SR LBBB, no events on tele, echocardiogram results with mild LVH EF 60% and grade 1 diastolic dysfunction. CT head without acute abnormality. Max temp 99.1. She is non-toxic appearing. Waiting for  EEG to be done. Continue Vancomycin and ceftazidime day #3. Consider narrowing.  Active Problems:  HCAP (healthcare-associated pneumonia): CT chest concerning for LLL atelectasis or infiltrate. Max temp 99.1 orally.  BP initially somewhat soft is improved. Strep pneumonia urine antigen negative and legionella urine antigen in process Vancomycin and ceftazidime day #3. Oxygen saturation level >90% on room air. Will consider narrowing antibiotics   AKI (acute kidney injury): may be related to decreased po intake in setting of hypotension. Resolved with IV fluids. Continue to hold nephrotoxins. monitor   Hypotension: etiology unclear. Improved. Holding losartan   Bradycardia: HR range 55-72. evaluated by cardiology who opine no significant pauses on tele or evidence of block. TSH elevated and synthroid increased. Some question as to possibility that patient received another patient's medications while at facility as there is another elderly female named Comes.   Hyponatremia: mild. Improved with IV fluids but fluids discontinued 05/20/15. Sodium level 134 today. Improved po intake. Will recheck in am.    Hypothyroidism: TSH 11.4. Free T4 level 1.12. Synthroid increased. Will need recheck 6 weeks.    Iron deficiency anemia: chart review indicates hx of same. Current Hg less than baseline which appears to be 10-11. May be dilutional.  Anemia panel 2 weeks ago with  iron level 6. EGD done 2 weeks ago revealed normal ampulla of vater and few gastric antral vascular ectasia without stigmata of bleed but otherwise showed no evidence of peptic ulcer disease. Advised to avoid NSAIDS. Continue PPI. Consider ferrous sulfate    Sphenoid sinusitis: antibiotics as noted above.   Abnormal EKG: evaluated by cardiology who opine diffuse TWI's and given no symptoms, troponin negative, her age and normal echo would not pursue further testing.   GERD: taking soft foods well (left her teeth at home). Stable. Will resume home  PPI  Pancreatitis: patient recently hospitalizied for acute pancreatitis. Chart review indicates felt to be possibly Billiary pancreatitis. GI followed and she did not have ductal dilatation on MRCP. According to discharge summary dated 05/09/15 since she had a possible defect in her Biliary tree, she will likely need another study in the next 3-4 months. Lipase today is slightly elevated but trending down from 141 on admission. May need close OP follow up with GI  Code Status: full Family Communication: daughter at bedside Disposition Plan: back to facility tomorrow   Consultants:  cardiology  Procedures:  EEG  Antibiotics:  Vancomycin 05/19/15>>  ceftazidime 05/19/15>>  HPI/Subjective: Sitting up in bed. Reports feeling "good". Denies pain/discomfort  Objective: Filed Vitals:   05/21/15 0553  BP: 121/62  Pulse: 65  Temp: 98.7 F (37.1 C)  Resp: 16    Intake/Output Summary (Last 24 hours) at 05/21/15 1307 Last data filed at 05/21/15 0848  Gross per 24 hour  Intake    240 ml  Output      0 ml  Net    240 ml   Filed Weights   05/18/15 1716 05/18/15 2203  Weight: 54.432 kg (120 lb) 61.4 kg (135  lb 5.8 oz)    Exam:   General:  Well nourished alert appears comfortable  Cardiovascular: RRR +murmur no LE edema  Respiratory: normal effort BS clear bilaterally no wheeze  Abdomen: soft +BS non-tender to  palpation  Musculoskeletal: joints without swelling/erythema   Data Reviewed: Basic Metabolic Panel:  Recent Labs Lab 05/18/15 1736 05/19/15 0916 05/20/15 0700 05/21/15 0651  NA 134* 135 136 134*  K 4.9 4.4 4.3 3.7  CL 102 108 110 107  CO2 25 22 22 23   GLUCOSE 127* 98 112* 108*  BUN 11 13 13 12   CREATININE 1.47* 1.07* 0.88 0.86  CALCIUM 8.0* 7.4* 7.5* 7.8*   Liver Function Tests:  Recent Labs Lab 05/18/15 1736 05/19/15 0916  AST 16 14*  ALT 8* 7*  ALKPHOS 51 45  BILITOT 0.3 0.6  PROT 6.4* 5.6*  ALBUMIN 2.6* 2.3*    Recent Labs Lab 05/18/15 1756 05/19/15 0916 05/21/15 0651  LIPASE 141* 94* 75*   No results for input(s): AMMONIA in the last 168 hours. CBC:  Recent Labs Lab 05/18/15 1736 05/19/15 0923 05/20/15 0700  WBC 6.3 4.8 5.2  NEUTROABS 4.2  --   --   HGB 9.6* 8.4* 8.7*  HCT 28.8* 25.6* 26.4*  MCV 85.5 85.6 86.0  PLT 420* 367 432*   Cardiac Enzymes:  Recent Labs Lab 05/18/15 2146 05/19/15 0916  TROPONINI <0.03 <0.03   BNP (last 3 results) No results for input(s): BNP in the last 8760 hours.  ProBNP (last 3 results) No results for input(s): PROBNP in the last 8760 hours.  CBG: No results for input(s): GLUCAP in the last 168 hours.  Recent Results (from the past 240 hour(s))  Blood Culture (routine x 2)     Status: None (Preliminary result)   Collection Time: 05/18/15  5:36 PM  Result Value Ref Range Status   Specimen Description BLOOD LEFT ARM DRAWN BY RN  Final   Special Requests BOTTLES DRAWN AEROBIC AND ANAEROBIC [place]  Final   Culture NO GROWTH 3 DAYS  Final   Report Status PENDING  Incomplete  Blood Culture (routine x 2)     Status: None (Preliminary result)   Collection Time: 05/18/15  5:50 PM  Result Value Ref Range Status   Specimen Description BLOOD RIGHT ARM  Final   Special Requests BOTTLES DRAWN AEROBIC AND ANAEROBIC 8CC  Final   Culture NO GROWTH 3 DAYS  Final   Report Status PENDING  Incomplete  Urine culture      Status: None   Collection Time: 05/18/15  6:32 PM  Result Value Ref Range Status   Specimen Description URINE, CLEAN CATCH  Final   Special Requests NONE  Final   Culture   Final    MULTIPLE SPECIES PRESENT, SUGGEST RECOLLECTION Performed at [facility]    Report Status 05/20/2015 FINAL  Final  MRSA PCR Screening     Status: None   Collection Time: 05/18/15 11:14 PM  Result Value Ref Range Status   MRSA by PCR NEGATIVE NEGATIVE Final    Comment:        The GeneXpert MRSA Assay (FDA approved for NASAL specimens only), is one component of a comprehensive MRSA colonization surveillance program. It is not intended to diagnose MRSA infection nor to guide or monitor treatment for MRSA infections.      Studies: No results found.  Scheduled Meds: . cefTAZidime (FORTAZ)  IV  1 g Intravenous Q12H  . enoxaparin (LOVENOX) injection  40 mg  Subcutaneous QHS  . ferrous sulfate  325 mg Oral Q breakfast  . fluticasone  1 spray Each Nare Daily  . guaiFENesin  600 mg Oral BID  . ipratropium-albuterol  3 mL Nebulization TID  . levothyroxine  150 mcg Oral QAC breakfast  . loratadine  10 mg Oral Daily  . olopatadine  1 drop Both Eyes BID  . predniSONE  20 mg Oral BID WC  . vancomycin  750 mg Intravenous Q24H   Continuous Infusions:   Principal Problem:   Acute encephalopathy Active Problems:   Hypothyroidism   GERD (gastroesophageal reflux disease)   Iron deficiency anemia   Hypotension   Unresponsive episode   Sphenoid sinusitis   HCAP (healthcare-associated pneumonia)   AKI (acute kidney injury)   Bradycardia   Anemia, iron deficiency   Arterial hypotension    Time spent: 35 minutes    [facility] Pager [phone]. If 7PM-7AM, please contact night-coverage at [email or web address], password [facility] 05/21/2015, 1:07 PM  LOS: 3 days

## 2015-05-21 NOTE — Progress Notes (Signed)
EEG Completed; Results Pending  

## 2015-05-22 ENCOUNTER — Inpatient Hospital Stay
Admission: RE | Admit: 2015-05-22 | Discharge: 2015-06-30 | Disposition: A | Discharge status: Home / Self Care | Payer: Medicaid Other | Source: Ambulatory Visit | Attending: Internal Medicine | Admitting: Internal Medicine

## 2015-05-22 DIAGNOSIS — K219 Gastro-esophageal reflux disease without esophagitis: Secondary | ICD-10-CM

## 2015-05-22 DIAGNOSIS — R404 Transient alteration of awareness: Secondary | ICD-10-CM

## 2015-05-22 LAB — BASIC METABOLIC PANEL
Anion gap: 5 (ref 5–15)
BUN: 9 mg/dL (ref 6–20)
CHLORIDE: 106 mmol/L (ref 101–111)
CO2: 23 mmol/L (ref 22–32)
CREATININE: 0.84 mg/dL (ref 0.44–1.00)
Calcium: 7.8 mg/dL — ABNORMAL LOW (ref 8.9–10.3)
GFR calc Af Amer: >60 mL/min (ref >60)
GFR calc non Af Amer: 59 mL/min — ABNORMAL LOW (ref >60)
Glucose, Bld: 107 mg/dL — ABNORMAL HIGH (ref 65–99)
Potassium: 3.3 mmol/L — ABNORMAL LOW (ref 3.5–5.1)
SODIUM: 134 mmol/L — AB (ref 135–145)

## 2015-05-22 LAB — LEGIONELLA ANTIGEN, URINE

## 2015-05-22 MED ORDER — AMOXICILLIN 250 MG PO CAPS: 250.0000 mg | ORAL_CAPSULE | Freq: Two times a day (BID) | ORAL | Status: DC

## 2015-05-22 MED ORDER — LEVOTHYROXINE SODIUM 150 MCG PO TABS: 150.0000 ug | ORAL_TABLET | Freq: Every day | ORAL | Status: DC

## 2015-05-22 MED ORDER — FERROUS SULFATE 325 (65 FE) MG PO TABS
325.0000 mg | ORAL_TABLET | Freq: Every day | ORAL | Status: DC
Start: 2015-05-22 — End: 2016-01-20

## 2015-05-22 MED ORDER — AMOXICILLIN 250 MG PO CAPS: 250.0000 mg | ORAL_CAPSULE | Freq: Three times a day (TID) | ORAL | Status: DC

## 2015-05-22 MED ORDER — POTASSIUM CHLORIDE CRYS ER 20 MEQ PO TBCR
40.0000 meq | EXTENDED_RELEASE_TABLET | ORAL | Status: AC
  Administered 2015-05-22 (×2): 40 meq via ORAL
  Filled 2015-05-22 (×2): qty 2

## 2015-05-22 MED ORDER — LOSARTAN POTASSIUM 50 MG PO TABS
100.0000 mg | ORAL_TABLET | Freq: Every day | ORAL | Status: DC
  Administered 2015-05-22: 100 mg via ORAL
  Filled 2015-05-22: qty 2

## 2015-05-22 NOTE — Discharge Summary (Signed)
Physician Discharge Summary  Jo Hardin VEH:[id number] DOB: 1922/05/14 DOA: 05/18/2015  PCP: No primary care provider on file.  Admit date: 05/18/2015 Discharge date: 05/22/2015  Time spent: 40 minutes  Recommendations for Outpatient Follow-up:   Being discharged to [facility]. Follow up on resolution of sinusitus/HCAP. BMET 1-2 weeks to track sodium and potassium level.   TSH in 4-6 weeks.  EEG results pending  Discharge Diagnoses:  Principal Problem:   Acute encephalopathy Active Problems:   Hypothyroidism   GERD (gastroesophageal reflux disease)   Iron deficiency anemia   Hypotension   Unresponsive episode   Sphenoid sinusitis   HCAP (healthcare-associated pneumonia)   AKI (acute kidney injury)   Bradycardia   Anemia, iron deficiency   Arterial hypotension   Discharge Condition: stable  Diet recommendation: dysphagia 3 thin liquid heart healthy   Filed Weights   05/18/15 1716 05/18/15 2203  Weight: 54.432 kg (120 lb) 61.4 kg (135 lb 5.8 oz)    History of present illness:  Jo Hardin is a 81 y.o. female  With h/o breast cancer s/p lumpectomy and adjuvant chemo for about a year, h/o hypothyroidism, htn, hld, arthritis was brought to [facility] ED by EMS on 05/18/15 lethargy, bradycardia and hypotension. Patient was recently discharged to SNF after being hospitalized for pancreatitis. Per family patient was normally talkative and alert, patient became more lethargic and only responded to sternal rub when EMS arrived. Family reported patient had been having nonproductive cough, no fever, no complaints of chest pain, family reported that patient had been npo for the previous two days due to elevation of lipase and was given ivf in the SNF. Family also noticed that patient had developed bilateral ankle edema in the previous few days.  ED course, initial on arrival, sbp in the 70's heart rate in the low 50's. Initial work up showed acute renal failure, mild elevation of lipase,  baseline anemia, cxr ? Atelectasis, ua + bacteria, negative nitrite, negative leukocytes. ct head no acute findings. Patient was given 2liter of ivf, bp and heart rate improved, mental status improved. Of note, per EDP, she talked to the SNF P and there was a concern of betablocker being given to the patient accidentally.   On admission patient was lethargic, but opened eyes to answer questions and following command. She was oriented to person. She denied pain. Did have intermittent dry cough during encounter.   Hospital Course:  Acute encephalopathy/unresponsive episode: resolved at discharge. Likely related to infectious process ie. Sinusitis and possible HCAP in setting of AKI. Troponin neg x2, EKG with SR LBBB, no events on tele, echocardiogram results with mild LVH EF 60% and grade 1 diastolic dysfunction. CT head without acute abnormality. She remained afebrile. She remianed non-toxic appearing. EEG complete and will follow results. She was provided with Vancomycin and ceftazidime initially. Vancomycin discontinued 05/21/15. Will discharge with4 more days amoxicillin.  Active Problems:  HCAP (healthcare-associated pneumonia): CT chest concerning for LLL atelectasis or infiltrate. Max temp 99.1 orally. BP initially somewhat soft but improved and by day of discharge home Cozaar resumed. Strep pneumonia urine antigen and legionella urine antigen negative. Oxygen saturation level >97% on room air.   AKI (acute kidney injury): Related to decreased po intake in setting of hypotension. Resolved with IV fluids.   Hypotension: etiology unclear. Improved. Holding losartan   Bradycardia: HR range 55-72. evaluated by cardiology who opined no significant pauses on tele or evidence of block. TSH elevated and synthroid increased. Some question as to possibility that  patient received another patient's medications while at facility as there was another female named [name].   Hyponatremia: mild.  Improved with IV fluids.Sodium level 134 on day of discharge. Recommend BMET 1-2 weeks.    Hypothyroidism: TSH 11.4. Free T4 level 1.12. Synthroid increased. Will need recheck 6 weeks.    Iron deficiency anemia: chart review indicates hx of same. Hg less than baseline which appeared to be 10-11. Anemia panel 2 weeks ago with iron level 6. EGD done 2 weeks ago revealed normal ampulla of vater and few gastric antral vascular ectasia without stigmata of bleed but otherwise showed no evidence of peptic ulcer disease. Advised to avoid NSAIDS. Continue PPI. Ferrous sulfate started    Sphenoid sinusitis: antibiotics as noted above.   Abnormal EKG: evaluated by cardiology who opine diffuse TWI's and given no symptoms, troponin negative, her age and normal echo would not pursue further testing.   GERD: taking soft foods well (left her teeth at home). Stable.   Pancreatitis: patient recently hospitalizied for acute pancreatitis. Chart review indicated felt to be possibly Billiary pancreatitis. GI followed and she did not have ductal dilatation on MRCP. According to discharge summary dated 05/09/15 since she had a possible defect in her Biliary tree, she will likely need another study in the next 3-4 months. Lipase at discharge slightly elevated but trending down from 141 on admission. May need close OP follow up with GI Procedures:  EEG  Consultations:  cardiology  Discharge Exam: Filed Vitals:   05/22/15 1445  BP: 130/65  Pulse: 73  Temp: 97.9 F (36.6 C)  Resp: 16    General: obese alert appears comfortable Cardiovascular: RRR +murmur no LE edema Respiratory: normal effort BS slightly coarse moist non-productive cough faint expiratory wheeze  Discharge Instructions    Current Discharge Medication List    START taking these medications   Details  amoxicillin (AMOXIL) 250 MG capsule Take 1 capsule (250 mg total) by mouth 2 (two) times daily. Qty: 8 capsule, Refills: 0     ferrous sulfate 325 (65 FE) MG tablet Take 1 tablet (325 mg total) by mouth daily with breakfast.      CONTINUE these medications which have CHANGED   Details  levothyroxine (SYNTHROID, LEVOTHROID) 150 MCG tablet Take 1 tablet (150 mcg total) by mouth daily before breakfast. Qty: 30 tablet, Refills: 2      CONTINUE these medications which have NOT CHANGED   Details  albuterol (PROVENTIL) (2.5 MG/3ML) 0.083% nebulizer solution Take 2.5 mg by nebulization every 6 (six) hours as needed for wheezing or shortness of breath.    Amino Acids-Protein Hydrolys (FEEDING SUPPLEMENT, PRO-STAT SUGAR FREE 64,) LIQD Take 30 mLs by mouth 2 (two) times daily.    docusate sodium (COLACE) 100 MG capsule Take 100 mg by mouth every morning.    fluticasone (FLONASE) 50 MCG/ACT nasal spray Place 1 spray into the nose 2 (two) times daily.    guaiFENesin (MUCINEX) 600 MG 12 hr tablet Take 600 mg by mouth 2 (two) times daily.    loratadine (CLARITIN) 10 MG tablet Take 10 mg by mouth daily.    losartan (COZAAR) 100 MG tablet Take 100 mg by mouth daily.    magnesium oxide (MAG-OX) 400 MG tablet Take 400 mg by mouth daily.    Olopatadine HCl (PATADAY) 0.2 % SOLN Place 1 drop into both eyes daily.    omeprazole (PRILOSEC) 20 MG capsule Take 20 mg by mouth daily.    potassium chloride (K-DUR) 10 MEQ  tablet Take 10 mEq by mouth 2 (two) times daily.    simvastatin (ZOCOR) 10 MG tablet Take 10 mg by mouth at bedtime.    Vitamin D, Ergocalciferol, (DRISDOL) 50000 UNITS CAPS Take 50,000 Units by mouth every Tuesday.       STOP taking these medications     amoxicillin-clavulanate (AUGMENTIN) 500-125 MG per tablet        Allergies  Allergen Reactions  . Codeine Nausea And Vomiting  . Nsaids    Follow-up Information    Follow up with [facility].   Specialty:  Skilled Nursing Facility   Contact information:   [address] [place] [address] [phone]         The results of significant diagnostics from this hospitalization (including imaging, microbiology, ancillary and laboratory) are listed below for reference.    Significant Diagnostic Studies: Ct Head Wo Contrast  05/18/2015   CLINICAL DATA:  Altered mental status today.  EXAM: CT HEAD WITHOUT CONTRAST  TECHNIQUE: Contiguous axial images were obtained from the base of the skull through the vertex without intravenous contrast.  COMPARISON:  10/28/2012  FINDINGS: No mass effect or midline shift. No evidence of acute intracranial hemorrhage, or infarction. No abnormal extra-axial fluid collections. Gray-white matter differentiation is normal. Basal cisterns are preserved. There is atrophy and chronic small vessel disease changes. Left cerebellar punctate hypoattenuation likely represents an old lacunar infarct.  No depressed skull fractures. Right sphenoid sinus is nearly completely opacified with air-fluid level accounted dependently. Visualized paranasal sinuses and mastoid air cells are otherwise not opacified.  IMPRESSION: No acute intracranial abnormality.  Atrophy, chronic microvascular disease.  Probable old left cerebellar lacunar infarct.  Right sphenoid sinusitis. The high density material within the sphenoid sinus raise a concern of possible fungal sinusitis.   Electronically Signed   By: Fidela Salisbury M.D.   On: 05/18/2015 18:40   Ct Chest Wo Contrast  05/18/2015   CLINICAL DATA:  81 year old with cough, weakness and shortness of breath. Initial encounter.  EXAM: CT CHEST WITHOUT CONTRAST  TECHNIQUE: Multidetector CT imaging of the chest was performed following the standard protocol without IV contrast.  COMPARISON:  Radiographs 05/18/2015.  Abdominal CT 05/03/2015.  FINDINGS: Mediastinum/Nodes: There are no enlarged mediastinal, hilar or axillary lymph nodes.Right axillary surgical clips are noted. The thyroid gland, trachea and esophagus demonstrate no significant findings. The heart  is mildly enlarged and there is a small pericardial effusion. There is relatively mild atherosclerosis of the aorta, great vessels and coronary arteries for age.  Lungs/Pleura: There is trace pleural fluid on the left. The lungs demonstrate diffuse central airway thickening with increased dependent opacity in the left lower lobe which may reflect atelectasis or a developing infiltrate.  Upper abdomen: Stable mild edematous changes. No acute findings demonstrated.  Musculoskeletal/Chest wall: There is no chest wall mass or suspicious osseous finding. There is a mild superior endplate compression deformity at T5, age indeterminate. Chronic appearing inferior endplate Schmorl's node at T12 is unchanged from recent abdominal CT.  IMPRESSION: 1. New small left pleural effusion with increased left lower lobe atelectasis or early infiltrate. No consolidation. 2. Underlying chronic central airway thickening. 3. Mild atherosclerosis. 4. No evidence of metastatic breast cancer. 5. Age indeterminate mild T5 compression deformity.   Electronically Signed   By: Richardean Sale M.D.   On: 05/18/2015 20:54   Ct Abdomen Pelvis W Contrast  05/03/2015   CLINICAL DATA:  Abdominal pain and vomiting. Diarrhea.  History breast cancer 8 years ago.  EXAM: CT ABDOMEN AND PELVIS WITH CONTRAST  TECHNIQUE: Multidetector CT imaging of the abdomen and pelvis was performed using the standard protocol following bolus administration of intravenous contrast.  CONTRAST:  63mL OMNIPAQUE IOHEXOL 300 MG/ML SOLN, 88mL OMNIPAQUE IOHEXOL 300 MG/ML SOLN  COMPARISON:  None.  FINDINGS: Lower chest: Cardiac enlargement. Pulmonary artery enlargement. Mild atelectasis in the lung bases. No effusion or pneumonia.  Hepatobiliary: Linear hypodensity in the right lobe liver may represent Mild biliary dilatation. Central bile ducts mildly prominent however common bile duct is not dilated measuring 6 mm. Gallbladder wall shows hyper enhancement without significant  thickening. There is fluid around the gallbladder which may be related to ascites. Small amount of ascites is also noted in the infrahepatic region. No liver mass lesion.  Pancreas: Mild peripancreatic edema raises the possibility of pancreatitis. No pancreatic mass or calcification. There is mild edema extending into the mesentery.  Spleen: Negative  Adrenals/Urinary Tract: Normal kidneys. No renal mass or obstruction. No renal calculi. Urinary bladder normal.  Stomach/Bowel: Negative for bowel obstruction. No significant bowel edema. Normal appendix.  Vascular/Lymphatic: Atherosclerotic aorta with tortuosity but no aneurysm.  Reproductive: Uterus not enlarged. Small uterine calcifications compatible with chronic fibroid.  Other: No free fluid in the pelvis. Mild amount of ascites around the liver and gallbladder. No adenopathy.  Musculoskeletal: Extensive disc degeneration and spondylosis diffusely. Negative for acute fracture. Schmorl's node inferior endplate of Z16  IMPRESSION: Mild ascites in the upper abdomen around the gallbladder and liver.  Mild peripancreatic edema extending into the mesenteric, possibly due to pancreatitis.  Central bile ducts mildly prominent however common bile duct is normal at 6 mm. Linear branching hypodensity in the right lobe liver may represent a mildly dilated bile duct.   Electronically Signed   By: Franchot Gallo M.D.   On: 05/03/2015 16:26   Mr 3d Recon At Scanner  05/06/2015   CLINICAL DATA:  81 year old female with upper abdominal pain and nausea and vomiting for the past 3 days. History of breast cancer.  EXAM: MRI ABDOMEN WITHOUT AND WITH CONTRAST (INCLUDING MRCP)  TECHNIQUE: Multiplanar multisequence MR imaging of the abdomen was performed both before and after the administration of intravenous contrast. Heavily T2-weighted images of the biliary and pancreatic ducts were obtained, and three-dimensional MRCP images were rendered by post processing.  CONTRAST:  43mL  MULTIHANCE GADOBENATE DIMEGLUMINE 529 MG/ML IV SOLN  COMPARISON:  CT the abdomen and pelvis 05/03/2015.  FINDINGS: Comment: Study is limited by considerable patient respiratory motion.  Lower chest: Dependent increased signal intensity in the lower lobes of the lungs bilaterally is nonspecific, but likely to reflect areas of atelectasis. Cardiomegaly.  Hepatobiliary: MRCP images are severely limited by patient respiratory motion. With these limitations in mind, there does appear to be a focal narrowing of the proximal common hepatic duct, best appreciated on image 46 of series 13, and image 1 of series 101. On axial images, this region is poorly depicted, and simply appears decompressed, without overt intrinsic or extrinsic mass causing obstruction. There is very minimal intrahepatic biliary ductal dilatation. In segment 5 of the liver there is a more focal T2 signal intensity which predominantly appears very ductal in the central aspect of the liver, suggesting some focal periportal edema. In the more peripheral aspect of segment 5 there appears to be some mild peripheral intrahepatic biliary ductal dilatation, with a small amount of surrounding enhancement, best appreciated on image 34 of series 5006. No  discrete hepatic mass is confidently identified at this time. Gallbladder is moderately distended. No definite gallstones. No gallbladder wall thickening.  Pancreas: No pancreatic mass. No pancreatic ductal dilatation. No pancreatic or peripancreatic fluid or inflammatory changes.  Spleen: Unremarkable.  Adrenals/Urinary Tract: Bilateral kidneys and bilateral adrenal glands are normal in appearance.  Stomach/Bowel: Visualized portions are unremarkable.  Vascular/Lymphatic: No aneurysm identified in the visualized abdominal vasculature. No lymphadenopathy noted in the abdomen.  Other: Trace volume of ascites.  Musculoskeletal: No aggressive osseous lesion noted in the visualized portions of the skeleton.   IMPRESSION: 1. Unusual appearance of the liver where there appears to be some mild peripheral intrahepatic biliary ductal dilatation, most evident in segment 5, where there also appears to be some focal periportal edema/inflammation. This is poorly evaluated on today's study secondary to extensive patient respiratory motion. The possibility of a focal area of cholangitis should be considered. While a centrally obstructing lesion is not excluded, particularly in light of the narrowing of the proximal common hepatic duct, a discrete lesion is not confidently identified on today's study. MRI imaging in this elderly individual who appears unable to adequately hold her breath for the examination is very sub optimal, and any future follow-up should be performed as a contrast-enhanced CT scan (which is less prone to motion related artifacts). At this time, clinical correlation is recommended. Should further imaging followup be clinically appropriate, repeat CT of the abdomen with and without IV gadolinium in 3-6 months could be performed to exclude the possibility of a aggressive process such as a cholangiocarcinoma, which is simply not detected on today's examination. 2. Trace volume of ascites.   Electronically Signed   By: Vinnie Langton M.D.   On: 05/06/2015 09:33   US Venous Img Lower Bilateral  05/19/2015   CLINICAL DATA:  Bilateral ankle edema  EXAM: BILATERAL LOWER EXTREMITY VENOUS DOPPLER ULTRASOUND  TECHNIQUE: Gray-scale sonography with graded compression, as well as color Doppler and duplex ultrasound were performed to evaluate the lower extremity deep venous systems from the level of the common femoral vein and including the common femoral, femoral, profunda femoral, popliteal and calf veins including the posterior tibial, peroneal and gastrocnemius veins when visible. The superficial great saphenous vein was also interrogated. Spectral Doppler was utilized to evaluate flow at rest and with distal  augmentation maneuvers in the common femoral, femoral and popliteal veins.  COMPARISON:  None.  FINDINGS: RIGHT LOWER EXTREMITY  Common Femoral Vein: No evidence of thrombus. Normal compressibility, respiratory phasicity and response to augmentation.  Saphenofemoral Junction: No evidence of thrombus. Normal compressibility and flow on color Doppler imaging.  Profunda Femoral Vein: No evidence of thrombus. Normal compressibility and flow on color Doppler imaging.  Femoral Vein: No evidence of thrombus. Normal compressibility, respiratory phasicity and response to augmentation.  Popliteal Vein: No evidence of thrombus. Normal compressibility, respiratory phasicity and response to augmentation.  Calf Veins: Imaging is limited below the knee due to edema in the calf veins are not well evaluated.  LEFT LOWER EXTREMITY  Common Femoral Vein: No evidence of thrombus. Normal compressibility, respiratory phasicity and response to augmentation.  Saphenofemoral Junction: No evidence of thrombus. Normal compressibility and flow on color Doppler imaging.  Profunda Femoral Vein: No evidence of thrombus. Normal compressibility and flow on color Doppler imaging.  Femoral Vein: No evidence of thrombus. Normal compressibility, respiratory phasicity and response to augmentation.  Popliteal Vein: No evidence of thrombus. Normal compressibility, respiratory phasicity and response to augmentation.  Calf Veins: Imaging is  limited below the knee due to edema in the calf veins are not well evaluated.  IMPRESSION: Limited evaluation of the bilateral calf veins. No evidence deep venous thrombosis. Bilateral lower extremity/ ankle edema identified.   Electronically Signed   By: Skipper Cliche M.D.   On: 05/19/2015 11:16   Dg Chest Port 1 View  05/18/2015   CLINICAL DATA:  Lethargy with altered mental status today. History of hypertension and breast cancer. Initial encounter.  EXAM: PORTABLE CHEST - 1 VIEW  COMPARISON:  Abdominal CT  05/03/2015.  FINDINGS: 1754 hours. Patient is rotated to the right. Cardiomegaly and aortic ectasia are similar to that demonstrated on prior abdominal CT. There is mild chronic atelectasis at both lung bases. No confluent airspace opacity, edema or significant pleural effusion. Patient is status post right mastectomy and axillary node dissection. Degenerative changes are present at both shoulders. No acute osseous findings seen.  IMPRESSION: No acute cardiopulmonary process. Cardiomegaly and chronic bibasilar atelectasis.   Electronically Signed   By: Richardean Sale M.D.   On: 05/18/2015 18:13   Mr Abd W/wo Cm/mrcp  05/06/2015   CLINICAL DATA:  81 year old female with upper abdominal pain and nausea and vomiting for the past 3 days. History of breast cancer.  EXAM: MRI ABDOMEN WITHOUT AND WITH CONTRAST (INCLUDING MRCP)  TECHNIQUE: Multiplanar multisequence MR imaging of the abdomen was performed both before and after the administration of intravenous contrast. Heavily T2-weighted images of the biliary and pancreatic ducts were obtained, and three-dimensional MRCP images were rendered by post processing.  CONTRAST:  52mL MULTIHANCE GADOBENATE DIMEGLUMINE 529 MG/ML IV SOLN  COMPARISON:  CT the abdomen and pelvis 05/03/2015.  FINDINGS: Comment: Study is limited by considerable patient respiratory motion.  Lower chest: Dependent increased signal intensity in the lower lobes of the lungs bilaterally is nonspecific, but likely to reflect areas of atelectasis. Cardiomegaly.  Hepatobiliary: MRCP images are severely limited by patient respiratory motion. With these limitations in mind, there does appear to be a focal narrowing of the proximal common hepatic duct, best appreciated on image 46 of series 13, and image 1 of series 101. On axial images, this region is poorly depicted, and simply appears decompressed, without overt intrinsic or extrinsic mass causing obstruction. There is very minimal intrahepatic biliary  ductal dilatation. In segment 5 of the liver there is a more focal T2 signal intensity which predominantly appears very ductal in the central aspect of the liver, suggesting some focal periportal edema. In the more peripheral aspect of segment 5 there appears to be some mild peripheral intrahepatic biliary ductal dilatation, with a small amount of surrounding enhancement, best appreciated on image 34 of series 5006. No discrete hepatic mass is confidently identified at this time. Gallbladder is moderately distended. No definite gallstones. No gallbladder wall thickening.  Pancreas: No pancreatic mass. No pancreatic ductal dilatation. No pancreatic or peripancreatic fluid or inflammatory changes.  Spleen: Unremarkable.  Adrenals/Urinary Tract: Bilateral kidneys and bilateral adrenal glands are normal in appearance.  Stomach/Bowel: Visualized portions are unremarkable.  Vascular/Lymphatic: No aneurysm identified in the visualized abdominal vasculature. No lymphadenopathy noted in the abdomen.  Other: Trace volume of ascites.  Musculoskeletal: No aggressive osseous lesion noted in the visualized portions of the skeleton.  IMPRESSION: 1. Unusual appearance of the liver where there appears to be some mild peripheral intrahepatic biliary ductal dilatation, most evident in segment 5, where there also appears to be some focal periportal edema/inflammation. This is poorly evaluated on today's study secondary to extensive  patient respiratory motion. The possibility of a focal area of cholangitis should be considered. While a centrally obstructing lesion is not excluded, particularly in light of the narrowing of the proximal common hepatic duct, a discrete lesion is not confidently identified on today's study. MRI imaging in this elderly individual who appears unable to adequately hold her breath for the examination is very sub optimal, and any future follow-up should be performed as a contrast-enhanced CT scan (which is less  prone to motion related artifacts). At this time, clinical correlation is recommended. Should further imaging followup be clinically appropriate, repeat CT of the abdomen with and without IV gadolinium in 3-6 months could be performed to exclude the possibility of a aggressive process such as a cholangiocarcinoma, which is simply not detected on today's examination. 2. Trace volume of ascites.   Electronically Signed   By: Vinnie Langton M.D.   On: 05/06/2015 09:33    Microbiology: Recent Results (from the past 240 hour(s))  Blood Culture (routine x 2)     Status: None (Preliminary result)   Collection Time: 05/18/15  5:36 PM  Result Value Ref Range Status   Specimen Description BLOOD LEFT ARM DRAWN BY RN  Final   Special Requests BOTTLES DRAWN AEROBIC AND ANAEROBIC [place]  Final   Culture NO GROWTH 3 DAYS  Final   Report Status PENDING  Incomplete  Blood Culture (routine x 2)     Status: None (Preliminary result)   Collection Time: 05/18/15  5:50 PM  Result Value Ref Range Status   Specimen Description BLOOD RIGHT ARM  Final   Special Requests BOTTLES DRAWN AEROBIC AND ANAEROBIC 8CC  Final   Culture NO GROWTH 3 DAYS  Final   Report Status PENDING  Incomplete  Urine culture     Status: None   Collection Time: 05/18/15  6:32 PM  Result Value Ref Range Status   Specimen Description URINE, CLEAN CATCH  Final   Special Requests NONE  Final   Culture   Final    MULTIPLE SPECIES PRESENT, SUGGEST RECOLLECTION Performed at [facility]    Report Status 05/20/2015 FINAL  Final  MRSA PCR Screening     Status: None   Collection Time: 05/18/15 11:14 PM  Result Value Ref Range Status   MRSA by PCR NEGATIVE NEGATIVE Final    Comment:        The GeneXpert MRSA Assay (FDA approved for NASAL specimens only), is one component of a comprehensive MRSA colonization surveillance program. It is not intended to diagnose MRSA infection nor to guide or monitor treatment for MRSA infections.       Labs: Basic Metabolic Panel:  Recent Labs Lab 05/18/15 1736 05/19/15 0916 05/20/15 0700 05/21/15 0651 05/22/15 0914  NA 134* 135 136 134* 134*  K 4.9 4.4 4.3 3.7 3.3*  CL 102 108 110 107 106  CO2 25 22 22 23 23   GLUCOSE 127* 98 112* 108* 107*  BUN 11 13 13 12 9   CREATININE 1.47* 1.07* 0.88 0.86 0.84  CALCIUM 8.0* 7.4* 7.5* 7.8* 7.8*   Liver Function Tests:  Recent Labs Lab 05/18/15 1736 05/19/15 0916  AST 16 14*  ALT 8* 7*  ALKPHOS 51 45  BILITOT 0.3 0.6  PROT 6.4* 5.6*  ALBUMIN 2.6* 2.3*    Recent Labs Lab 05/18/15 1756 05/19/15 0916 05/21/15 0651  LIPASE 141* 94* 75*   No results for input(s): AMMONIA in the last 168 hours. CBC:  Recent Labs Lab 05/18/15 1736  05/19/15 0923 05/20/15 0700  WBC 6.3 4.8 5.2  NEUTROABS 4.2  --   --   HGB 9.6* 8.4* 8.7*  HCT 28.8* 25.6* 26.4*  MCV 85.5 85.6 86.0  PLT 420* 367 432*   Cardiac Enzymes:  Recent Labs Lab 05/18/15 2146 05/19/15 0916  TROPONINI <0.03 <0.03   BNP: BNP (last 3 results) No results for input(s): BNP in the last 8760 hours.  ProBNP (last 3 results) No results for input(s): PROBNP in the last 8760 hours.  CBG: No results for input(s): GLUCAP in the last 168 hours.     SignedRadene Gunning  [facility] 05/22/2015, 3:12 PM

## 2015-05-22 NOTE — Care Management Note (Signed)
Case Management Note  Patient Details  Name: Satoria Lovelady MRN: [id number] Date of Birth: 01/07/1922  Subjective/Objective:                    Action/Plan:   Expected Discharge Date:                  Expected Discharge Plan:  Skilled Nursing Facility  In-House Referral:  Clinical Social Work  Discharge planning Services  CM Consult  Post Acute Care Choice:  NA Choice offered to:  NA  DME Arranged:    DME Agency:     HH Arranged:    [place] Agency:     Status of Service:  Completed, signed off  Medicare Important Message Given:  Yes-second notification given Date Medicare IM Given:    Medicare IM give by:    Date Additional Medicare IM Given:    Additional Medicare Important Message give by:     If discussed at [place] of Stay Meetings, dates discussed:    Additional Comments: Pt discharged to [facility] center today. No CM needs noted. Christinia Gully [place], RN 05/22/2015, 2:31 PM

## 2015-05-22 NOTE — Discharge Summary (Deleted)
Physician Discharge Summary  Aleaha Kassis XKG:[id number] DOB: Aug 17, 1922 DOA: 05/18/2015  PCP: No primary care provider on file.  Admit date: 05/18/2015 Discharge date: 05/22/2015  Time spent: 40 minutes  Recommendations for Outpatient Follow-up:  1. Being discharged to [facility]. Follow up on resolution of sinusitus/HCAP. BMET 1-2 weeks to track sodium and potassium level.  2. TSH in 4-6 weeks. 3. EEG results pending   Discharge Diagnoses:  Principal Problem:   Acute encephalopathy Active Problems:   Hypothyroidism   GERD (gastroesophageal reflux disease)   Iron deficiency anemia   Hypotension   Unresponsive episode   Sphenoid sinusitis   HCAP (healthcare-associated pneumonia)   AKI (acute kidney injury)   Bradycardia   Anemia, iron deficiency   Arterial hypotension   Discharge Condition: stable  Diet recommendation: dysphagia 3 thin liquid heart healthy  Filed Weights   05/18/15 1716 05/18/15 2203  Weight: 54.432 kg (120 lb) 61.4 kg (135 lb 5.8 oz)    History of present illness:  Jo Hardin is a 81 y.o. female  With h/o breast cancer s/p lumpectomy and adjuvant chemo for about a year, h/o hypothyroidism, htn, hld, arthritis was brought to [facility] ED by EMS on 05/18/15 lethargy, bradycardia and hypotension. Patient was recently discharged to SNF after being hospitalized for pancreatitis. Per family patient was normally talkative and alert, patient became more lethargic and only responded to sternal rub when EMS arrived. Family reported patient had been having nonproductive cough, no fever, no complaints of chest pain, family reported that patient had been npo for the previous two days due to elevation of lipase and was given ivf in the SNF. Family also noticed that patient had developed bilateral ankle edema in the previous few days.  ED course, initial on arrival, sbp in the 70's heart rate in the low 50's. Initial work up showed acute renal failure, mild elevation of lipase,  baseline anemia, cxr ? Atelectasis, ua + bacteria, negative nitrite, negative leukocytes. ct head no acute findings. Patient was given 2liter of ivf, bp and heart rate improved, mental status improved. Of note, per EDP, she talked to the SNF P and  there was a concern of betablocker  being given to the patient accidentally.   On admission patient was  lethargic, but opened eyes to answer questions and following command. She was oriented to person. She denied pain. Did have intermittent dry cough during encounter.   Hospital Course:  Acute encephalopathy/unresponsive episode: resolved at discharge. Likely related to infectious process ie. Sinusitis and possible HCAP in setting of AKI. Troponin neg x2, EKG with SR LBBB, no events on tele, echocardiogram results with mild LVH EF 60% and grade 1 diastolic dysfunction. CT head without acute abnormality. She remained afebrile. She remianed non-toxic appearing.  EEG complete and will follow results. She was provided with Vancomycin and ceftazidime initially. Vancomycin discontinued 05/21/15. Will discharge with4 more days amoxicillin.   Active Problems:  HCAP (healthcare-associated pneumonia): CT chest concerning for LLL atelectasis or infiltrate. Max temp 99.1 orally. BP initially somewhat soft but improved and by day of discharge home Cozaar resumed.  Strep pneumonia urine antigen and legionella urine antigen negative. Oxygen saturation level >97% on room air.   AKI (acute kidney injury): Related to decreased po intake in setting of hypotension. Resolved with IV fluids.   Hypotension: etiology unclear. Improved. Holding losartan   Bradycardia: HR range 55-72. evaluated by cardiology who opined no significant pauses on tele or evidence of block. TSH elevated and synthroid increased.  Some question as to possibility that patient received another patient's medications while at facility as there was another female named Brigandi.   Hyponatremia: mild.  Improved with IV fluids.Sodium level 134 on day of discharge. Recommend BMET 1-2 weeks.    Hypothyroidism: TSH 11.4. Free T4 level 1.12. Synthroid increased. Will need recheck 6 weeks.    Iron deficiency anemia: chart review indicates hx of same. Hg less than baseline which appeared to be 10-11. Anemia panel 2 weeks ago with iron level 6. EGD done 2 weeks ago revealed normal ampulla of vater and few gastric antral vascular ectasia without stigmata of bleed but otherwise showed no evidence of peptic ulcer disease. Advised to avoid NSAIDS. Continue PPI. Ferrous sulfate started    Sphenoid sinusitis: antibiotics as noted above.   Abnormal EKG: evaluated by cardiology who opine diffuse TWI's and given no symptoms, troponin negative, her age and normal echo would not pursue further testing.   GERD: taking soft foods well (left her teeth at home). Stable.   Pancreatitis: patient recently hospitalizied for acute pancreatitis. Chart review indicated felt to be possibly Billiary pancreatitis. GI followed and she did not have ductal dilatation on MRCP. According to discharge summary dated 05/09/15 since she had a possible defect in her Biliary tree, she will likely need another study in the next 3-4 months. Lipase at discharge slightly elevated but trending down from 141 on admission. May need close OP follow up with GI   Procedures:  EEG  Consultations:  cardiology  Discharge Exam: Filed Vitals:   05/22/15 0622  BP: 166/59  Pulse: 84  Temp: 98.6 F (37 C)  Resp: 14    General: obese alert appears comfortable Cardiovascular: RRR, +murmur no LE edema Respiratory: normal effort BS slightly coarse, moist non-productive cough faint expiratory wheeze no crackles Neuro: alert and oriented speech clear  Discharge Instructions    Current Discharge Medication List    START taking these medications   Details  ferrous sulfate 325 (65 FE) MG tablet Take 1 tablet (325 mg total) by mouth  daily with breakfast.      CONTINUE these medications which have CHANGED   Details  levothyroxine (SYNTHROID, LEVOTHROID) 150 MCG tablet Take 1 tablet (150 mcg total) by mouth daily before breakfast. Qty: 30 tablet, Refills: 2      CONTINUE these medications which have NOT CHANGED   Details  albuterol (PROVENTIL) (2.5 MG/3ML) 0.083% nebulizer solution Take 2.5 mg by nebulization every 6 (six) hours as needed for wheezing or shortness of breath.    Amino Acids-Protein Hydrolys (FEEDING SUPPLEMENT, PRO-STAT SUGAR FREE 64,) LIQD Take 30 mLs by mouth 2 (two) times daily.    amoxicillin-clavulanate (AUGMENTIN) 500-125 MG per tablet Take 1 tablet by mouth 2 (two) times daily. 5 day course starting on 05/18/15    docusate sodium (COLACE) 100 MG capsule Take 100 mg by mouth every morning.    fluticasone (FLONASE) 50 MCG/ACT nasal spray Place 1 spray into the nose 2 (two) times daily.    guaiFENesin (MUCINEX) 600 MG 12 hr tablet Take 600 mg by mouth 2 (two) times daily.    loratadine (CLARITIN) 10 MG tablet Take 10 mg by mouth daily.    losartan (COZAAR) 100 MG tablet Take 100 mg by mouth daily.    magnesium oxide (MAG-OX) 400 MG tablet Take 400 mg by mouth daily.    Olopatadine HCl (PATADAY) 0.2 % SOLN Place 1 drop into both eyes daily.    omeprazole (PRILOSEC) 20 MG  capsule Take 20 mg by mouth daily.    potassium chloride (K-DUR) 10 MEQ tablet Take 10 mEq by mouth 2 (two) times daily.    simvastatin (ZOCOR) 10 MG tablet Take 10 mg by mouth at bedtime.    Vitamin D, Ergocalciferol, (DRISDOL) 50000 UNITS CAPS Take 50,000 Units by mouth every Tuesday.        Allergies  Allergen Reactions  . Codeine Nausea And Vomiting  . Nsaids    Follow-up Information    Follow up with [facility].   Specialty:  Skilled Nursing Facility   Contact information:   [address] [place] [address] [phone]        The results of significant  diagnostics from this hospitalization (including imaging, microbiology, ancillary and laboratory) are listed below for reference.    Significant Diagnostic Studies: Ct Head Wo Contrast  05/18/2015   CLINICAL DATA:  Altered mental status today.  EXAM: CT HEAD WITHOUT CONTRAST  TECHNIQUE: Contiguous axial images were obtained from the base of the skull through the vertex without intravenous contrast.  COMPARISON:  10/28/2012  FINDINGS: No mass effect or midline shift. No evidence of acute intracranial hemorrhage, or infarction. No abnormal extra-axial fluid collections. Gray-white matter differentiation is normal. Basal cisterns are preserved. There is atrophy and chronic small vessel disease changes. Left cerebellar punctate hypoattenuation likely represents an old lacunar infarct.  No depressed skull fractures. Right sphenoid sinus is nearly completely opacified with air-fluid level accounted dependently. Visualized paranasal sinuses and mastoid air cells are otherwise not opacified.  IMPRESSION: No acute intracranial abnormality.  Atrophy, chronic microvascular disease.  Probable old left cerebellar lacunar infarct.  Right sphenoid sinusitis. The high density material within the sphenoid sinus raise a concern of possible fungal sinusitis.   Electronically Signed   By: Fidela Salisbury M.D.   On: 05/18/2015 18:40   Ct Chest Wo Contrast  05/18/2015   CLINICAL DATA:  81 year old with cough, weakness and shortness of breath. Initial encounter.  EXAM: CT CHEST WITHOUT CONTRAST  TECHNIQUE: Multidetector CT imaging of the chest was performed following the standard protocol without IV contrast.  COMPARISON:  Radiographs 05/18/2015.  Abdominal CT 05/03/2015.  FINDINGS: Mediastinum/Nodes: There are no enlarged mediastinal, hilar or axillary lymph nodes.Right axillary surgical clips are noted. The thyroid gland, trachea and esophagus demonstrate no significant findings. The heart is mildly enlarged and there is a  small pericardial effusion. There is relatively mild atherosclerosis of the aorta, great vessels and coronary arteries for age.  Lungs/Pleura: There is trace pleural fluid on the left. The lungs demonstrate diffuse central airway thickening with increased dependent opacity in the left lower lobe which may reflect atelectasis or a developing infiltrate.  Upper abdomen: Stable mild edematous changes. No acute findings demonstrated.  Musculoskeletal/Chest wall: There is no chest wall mass or suspicious osseous finding. There is a mild superior endplate compression deformity at T5, age indeterminate. Chronic appearing inferior endplate Schmorl's node at T12 is unchanged from recent abdominal CT.  IMPRESSION: 1. New small left pleural effusion with increased left lower lobe atelectasis or early infiltrate. No consolidation. 2. Underlying chronic central airway thickening. 3. Mild atherosclerosis. 4. No evidence of metastatic breast cancer. 5. Age indeterminate mild T5 compression deformity.   Electronically Signed   By: Richardean Sale M.D.   On: 05/18/2015 20:54   Ct Abdomen Pelvis W Contrast  05/03/2015   CLINICAL DATA:  Abdominal pain and vomiting. Diarrhea. History breast cancer 8 years  ago.  EXAM: CT ABDOMEN AND PELVIS WITH CONTRAST  TECHNIQUE: Multidetector CT imaging of the abdomen and pelvis was performed using the standard protocol following bolus administration of intravenous contrast.  CONTRAST:  2mL OMNIPAQUE IOHEXOL 300 MG/ML SOLN, 91mL OMNIPAQUE IOHEXOL 300 MG/ML SOLN  COMPARISON:  None.  FINDINGS: Lower chest: Cardiac enlargement. Pulmonary artery enlargement. Mild atelectasis in the lung bases. No effusion or pneumonia.  Hepatobiliary: Linear hypodensity in the right lobe liver may represent Mild biliary dilatation. Central bile ducts mildly prominent however common bile duct is not dilated measuring 6 mm. Gallbladder wall shows hyper enhancement without significant thickening. There is fluid around  the gallbladder which may be related to ascites. Small amount of ascites is also noted in the infrahepatic region. No liver mass lesion.  Pancreas: Mild peripancreatic edema raises the possibility of pancreatitis. No pancreatic mass or calcification. There is mild edema extending into the mesentery.  Spleen: Negative  Adrenals/Urinary Tract: Normal kidneys. No renal mass or obstruction. No renal calculi. Urinary bladder normal.  Stomach/Bowel: Negative for bowel obstruction. No significant bowel edema. Normal appendix.  Vascular/Lymphatic: Atherosclerotic aorta with tortuosity but no aneurysm.  Reproductive: Uterus not enlarged. Small uterine calcifications compatible with chronic fibroid.  Other: No free fluid in the pelvis. Mild amount of ascites around the liver and gallbladder. No adenopathy.  Musculoskeletal: Extensive disc degeneration and spondylosis diffusely. Negative for acute fracture. Schmorl's node inferior endplate of A76  IMPRESSION: Mild ascites in the upper abdomen around the gallbladder and liver.  Mild peripancreatic edema extending into the mesenteric, possibly due to pancreatitis.  Central bile ducts mildly prominent however common bile duct is normal at 6 mm. Linear branching hypodensity in the right lobe liver may represent a mildly dilated bile duct.   Electronically Signed   By: Franchot Gallo M.D.   On: 05/03/2015 16:26   Mr 3d Recon At Scanner  05/06/2015   CLINICAL DATA:  81 year old female with upper abdominal pain and nausea and vomiting for the past 3 days. History of breast cancer.  EXAM: MRI ABDOMEN WITHOUT AND WITH CONTRAST (INCLUDING MRCP)  TECHNIQUE: Multiplanar multisequence MR imaging of the abdomen was performed both before and after the administration of intravenous contrast. Heavily T2-weighted images of the biliary and pancreatic ducts were obtained, and three-dimensional MRCP images were rendered by post processing.  CONTRAST:  82mL MULTIHANCE GADOBENATE DIMEGLUMINE 529  MG/ML IV SOLN  COMPARISON:  CT the abdomen and pelvis 05/03/2015.  FINDINGS: Comment: Study is limited by considerable patient respiratory motion.  Lower chest: Dependent increased signal intensity in the lower lobes of the lungs bilaterally is nonspecific, but likely to reflect areas of atelectasis. Cardiomegaly.  Hepatobiliary: MRCP images are severely limited by patient respiratory motion. With these limitations in mind, there does appear to be a focal narrowing of the proximal common hepatic duct, best appreciated on image 46 of series 13, and image 1 of series 101. On axial images, this region is poorly depicted, and simply appears decompressed, without overt intrinsic or extrinsic mass causing obstruction. There is very minimal intrahepatic biliary ductal dilatation. In segment 5 of the liver there is a more focal T2 signal intensity which predominantly appears very ductal in the central aspect of the liver, suggesting some focal periportal edema. In the more peripheral aspect of segment 5 there appears to be some mild peripheral intrahepatic biliary ductal dilatation, with a small amount of surrounding enhancement, best appreciated on image 34 of series 5006. No discrete hepatic mass is confidently  identified at this time. Gallbladder is moderately distended. No definite gallstones. No gallbladder wall thickening.  Pancreas: No pancreatic mass. No pancreatic ductal dilatation. No pancreatic or peripancreatic fluid or inflammatory changes.  Spleen: Unremarkable.  Adrenals/Urinary Tract: Bilateral kidneys and bilateral adrenal glands are normal in appearance.  Stomach/Bowel: Visualized portions are unremarkable.  Vascular/Lymphatic: No aneurysm identified in the visualized abdominal vasculature. No lymphadenopathy noted in the abdomen.  Other: Trace volume of ascites.  Musculoskeletal: No aggressive osseous lesion noted in the visualized portions of the skeleton.  IMPRESSION: 1. Unusual appearance of the liver  where there appears to be some mild peripheral intrahepatic biliary ductal dilatation, most evident in segment 5, where there also appears to be some focal periportal edema/inflammation. This is poorly evaluated on today's study secondary to extensive patient respiratory motion. The possibility of a focal area of cholangitis should be considered. While a centrally obstructing lesion is not excluded, particularly in light of the narrowing of the proximal common hepatic duct, a discrete lesion is not confidently identified on today's study. MRI imaging in this elderly individual who appears unable to adequately hold her breath for the examination is very sub optimal, and any future follow-up should be performed as a contrast-enhanced CT scan (which is less prone to motion related artifacts). At this time, clinical correlation is recommended. Should further imaging followup be clinically appropriate, repeat CT of the abdomen with and without IV gadolinium in 3-6 months could be performed to exclude the possibility of a aggressive process such as a cholangiocarcinoma, which is simply not detected on today's examination. 2. Trace volume of ascites.   Electronically Signed   By: Vinnie Langton M.D.   On: 05/06/2015 09:33   US Venous Img Lower Bilateral  05/19/2015   CLINICAL DATA:  Bilateral ankle edema  EXAM: BILATERAL LOWER EXTREMITY VENOUS DOPPLER ULTRASOUND  TECHNIQUE: Gray-scale sonography with graded compression, as well as color Doppler and duplex ultrasound were performed to evaluate the lower extremity deep venous systems from the level of the common femoral vein and including the common femoral, femoral, profunda femoral, popliteal and calf veins including the posterior tibial, peroneal and gastrocnemius veins when visible. The superficial great saphenous vein was also interrogated. Spectral Doppler was utilized to evaluate flow at rest and with distal augmentation maneuvers in the common femoral, femoral  and popliteal veins.  COMPARISON:  None.  FINDINGS: RIGHT LOWER EXTREMITY  Common Femoral Vein: No evidence of thrombus. Normal compressibility, respiratory phasicity and response to augmentation.  Saphenofemoral Junction: No evidence of thrombus. Normal compressibility and flow on color Doppler imaging.  Profunda Femoral Vein: No evidence of thrombus. Normal compressibility and flow on color Doppler imaging.  Femoral Vein: No evidence of thrombus. Normal compressibility, respiratory phasicity and response to augmentation.  Popliteal Vein: No evidence of thrombus. Normal compressibility, respiratory phasicity and response to augmentation.  Calf Veins: Imaging is limited below the knee due to edema in the calf veins are not well evaluated.  LEFT LOWER EXTREMITY  Common Femoral Vein: No evidence of thrombus. Normal compressibility, respiratory phasicity and response to augmentation.  Saphenofemoral Junction: No evidence of thrombus. Normal compressibility and flow on color Doppler imaging.  Profunda Femoral Vein: No evidence of thrombus. Normal compressibility and flow on color Doppler imaging.  Femoral Vein: No evidence of thrombus. Normal compressibility, respiratory phasicity and response to augmentation.  Popliteal Vein: No evidence of thrombus. Normal compressibility, respiratory phasicity and response to augmentation.  Calf Veins: Imaging is limited below the knee due  to edema in the calf veins are not well evaluated.  IMPRESSION: Limited evaluation of the bilateral calf veins. No evidence deep venous thrombosis. Bilateral lower extremity/ ankle edema identified.   Electronically Signed   By: Skipper Cliche M.D.   On: 05/19/2015 11:16   Dg Chest Port 1 View  05/18/2015   CLINICAL DATA:  Lethargy with altered mental status today. History of hypertension and breast cancer. Initial encounter.  EXAM: PORTABLE CHEST - 1 VIEW  COMPARISON:  Abdominal CT 05/03/2015.  FINDINGS: 1754 hours. Patient is rotated to the  right. Cardiomegaly and aortic ectasia are similar to that demonstrated on prior abdominal CT. There is mild chronic atelectasis at both lung bases. No confluent airspace opacity, edema or significant pleural effusion. Patient is status post right mastectomy and axillary node dissection. Degenerative changes are present at both shoulders. No acute osseous findings seen.  IMPRESSION: No acute cardiopulmonary process. Cardiomegaly and chronic bibasilar atelectasis.   Electronically Signed   By: Richardean Sale M.D.   On: 05/18/2015 18:13   Mr Abd W/wo Cm/mrcp  05/06/2015   CLINICAL DATA:  81 year old female with upper abdominal pain and nausea and vomiting for the past 3 days. History of breast cancer.  EXAM: MRI ABDOMEN WITHOUT AND WITH CONTRAST (INCLUDING MRCP)  TECHNIQUE: Multiplanar multisequence MR imaging of the abdomen was performed both before and after the administration of intravenous contrast. Heavily T2-weighted images of the biliary and pancreatic ducts were obtained, and three-dimensional MRCP images were rendered by post processing.  CONTRAST:  40mL MULTIHANCE GADOBENATE DIMEGLUMINE 529 MG/ML IV SOLN  COMPARISON:  CT the abdomen and pelvis 05/03/2015.  FINDINGS: Comment: Study is limited by considerable patient respiratory motion.  Lower chest: Dependent increased signal intensity in the lower lobes of the lungs bilaterally is nonspecific, but likely to reflect areas of atelectasis. Cardiomegaly.  Hepatobiliary: MRCP images are severely limited by patient respiratory motion. With these limitations in mind, there does appear to be a focal narrowing of the proximal common hepatic duct, best appreciated on image 46 of series 13, and image 1 of series 101. On axial images, this region is poorly depicted, and simply appears decompressed, without overt intrinsic or extrinsic mass causing obstruction. There is very minimal intrahepatic biliary ductal dilatation. In segment 5 of the liver there is a more  focal T2 signal intensity which predominantly appears very ductal in the central aspect of the liver, suggesting some focal periportal edema. In the more peripheral aspect of segment 5 there appears to be some mild peripheral intrahepatic biliary ductal dilatation, with a small amount of surrounding enhancement, best appreciated on image 34 of series 5006. No discrete hepatic mass is confidently identified at this time. Gallbladder is moderately distended. No definite gallstones. No gallbladder wall thickening.  Pancreas: No pancreatic mass. No pancreatic ductal dilatation. No pancreatic or peripancreatic fluid or inflammatory changes.  Spleen: Unremarkable.  Adrenals/Urinary Tract: Bilateral kidneys and bilateral adrenal glands are normal in appearance.  Stomach/Bowel: Visualized portions are unremarkable.  Vascular/Lymphatic: No aneurysm identified in the visualized abdominal vasculature. No lymphadenopathy noted in the abdomen.  Other: Trace volume of ascites.  Musculoskeletal: No aggressive osseous lesion noted in the visualized portions of the skeleton.  IMPRESSION: 1. Unusual appearance of the liver where there appears to be some mild peripheral intrahepatic biliary ductal dilatation, most evident in segment 5, where there also appears to be some focal periportal edema/inflammation. This is poorly evaluated on today's study secondary to extensive patient respiratory motion. The possibility  of a focal area of cholangitis should be considered. While a centrally obstructing lesion is not excluded, particularly in light of the narrowing of the proximal common hepatic duct, a discrete lesion is not confidently identified on today's study. MRI imaging in this elderly individual who appears unable to adequately hold her breath for the examination is very sub optimal, and any future follow-up should be performed as a contrast-enhanced CT scan (which is less prone to motion related artifacts). At this time, clinical  correlation is recommended. Should further imaging followup be clinically appropriate, repeat CT of the abdomen with and without IV gadolinium in 3-6 months could be performed to exclude the possibility of a aggressive process such as a cholangiocarcinoma, which is simply not detected on today's examination. 2. Trace volume of ascites.   Electronically Signed   By: Vinnie Langton M.D.   On: 05/06/2015 09:33    Microbiology: Recent Results (from the past 240 hour(s))  Blood Culture (routine x 2)     Status: None (Preliminary result)   Collection Time: 05/18/15  5:36 PM  Result Value Ref Range Status   Specimen Description BLOOD LEFT ARM DRAWN BY RN  Final   Special Requests BOTTLES DRAWN AEROBIC AND ANAEROBIC Kila  Final   Culture NO GROWTH 3 DAYS  Final   Report Status PENDING  Incomplete  Blood Culture (routine x 2)     Status: None (Preliminary result)   Collection Time: 05/18/15  5:50 PM  Result Value Ref Range Status   Specimen Description BLOOD RIGHT ARM  Final   Special Requests BOTTLES DRAWN AEROBIC AND ANAEROBIC 8CC  Final   Culture NO GROWTH 3 DAYS  Final   Report Status PENDING  Incomplete  Urine culture     Status: None   Collection Time: 05/18/15  6:32 PM  Result Value Ref Range Status   Specimen Description URINE, CLEAN CATCH  Final   Special Requests NONE  Final   Culture   Final    MULTIPLE SPECIES PRESENT, SUGGEST RECOLLECTION Performed at [facility]    Report Status 05/20/2015 FINAL  Final  MRSA PCR Screening     Status: None   Collection Time: 05/18/15 11:14 PM  Result Value Ref Range Status   MRSA by PCR NEGATIVE NEGATIVE Final    Comment:        The GeneXpert MRSA Assay (FDA approved for NASAL specimens only), is one component of a comprehensive MRSA colonization surveillance program. It is not intended to diagnose MRSA infection nor to guide or monitor treatment for MRSA infections.      Labs: Basic Metabolic Panel:  Recent Labs Lab  05/18/15 1736 05/19/15 0916 05/20/15 0700 05/21/15 0651 05/22/15 0914  NA 134* 135 136 134* 134*  K 4.9 4.4 4.3 3.7 3.3*  CL 102 108 110 107 106  CO2 25 22 22 23 23   GLUCOSE 127* 98 112* 108* 107*  BUN 11 13 13 12 9   CREATININE 1.47* 1.07* 0.88 0.86 0.84  CALCIUM 8.0* 7.4* 7.5* 7.8* 7.8*   Liver Function Tests:  Recent Labs Lab 05/18/15 1736 05/19/15 0916  AST 16 14*  ALT 8* 7*  ALKPHOS 51 45  BILITOT 0.3 0.6  PROT 6.4* 5.6*  ALBUMIN 2.6* 2.3*    Recent Labs Lab 05/18/15 1756 05/19/15 0916 05/21/15 0651  LIPASE 141* 94* 75*   No results for input(s): AMMONIA in the last 168 hours. CBC:  Recent Labs Lab 05/18/15 1736 05/19/15 0923 05/20/15 0700  WBC 6.3 4.8 5.2  NEUTROABS 4.2  --   --   HGB 9.6* 8.4* 8.7*  HCT 28.8* 25.6* 26.4*  MCV 85.5 85.6 86.0  PLT 420* 367 432*   Cardiac Enzymes:  Recent Labs Lab 05/18/15 2146 05/19/15 0916  TROPONINI <0.03 <0.03   BNP: BNP (last 3 results) No results for input(s): BNP in the last 8760 hours.  ProBNP (last 3 results) No results for input(s): PROBNP in the last 8760 hours.  CBG: No results for input(s): GLUCAP in the last 168 hours.     SignedRadene Gunning  [facility] 05/22/2015, 1:50 PM

## 2015-05-22 NOTE — Clinical Social Work Note (Signed)
Pt and daughter accept bed offer at [facility]. Pt d/c today and will transfer with staff. Pt, facility, and Otelia Sergeant (daughter) aware and agreeable.   Benay Pike, [place]

## 2015-05-22 NOTE — Care Management Important Message (Signed)
Important Message  Patient Details  Name: Jo Hardin MRN: [id number] Date of Birth: [birth year]   Medicare Important Message Given:  Yes-second notification given    Joylene Draft, RN 05/22/2015, 2:28 PM

## 2015-05-23 ENCOUNTER — Encounter (HOSPITAL_COMMUNITY)
Admission: AD | Admit: 2015-05-23 | Discharge: 2015-05-23 | Disposition: A | Discharge status: Home / Self Care | Payer: Medicare Other | Source: Skilled Nursing Facility | Attending: Internal Medicine | Admitting: Internal Medicine

## 2015-05-23 ENCOUNTER — Non-Acute Institutional Stay (SKILLED_NURSING_FACILITY): Payer: Medicare Other | Admitting: Internal Medicine

## 2015-05-23 DIAGNOSIS — I10 Essential (primary) hypertension: Secondary | ICD-10-CM | POA: Diagnosis not present

## 2015-05-23 DIAGNOSIS — N179 Acute kidney failure, unspecified: Secondary | ICD-10-CM

## 2015-05-23 DIAGNOSIS — J189 Pneumonia, unspecified organism: Secondary | ICD-10-CM | POA: Diagnosis not present

## 2015-05-23 DIAGNOSIS — E038 Other specified hypothyroidism: Secondary | ICD-10-CM | POA: Diagnosis not present

## 2015-05-23 DIAGNOSIS — G9340 Encephalopathy, unspecified: Secondary | ICD-10-CM

## 2015-05-23 LAB — CULTURE, BLOOD (ROUTINE X 2)
Culture: NO GROWTH
Culture: NO GROWTH

## 2015-05-23 LAB — CBC
HEMATOCRIT: 29.1 % — AB (ref 36.0–46.0)
Hemoglobin: 9.6 g/dL — ABNORMAL LOW (ref 12.0–15.0)
MCH: 28.2 pg (ref 26.0–34.0)
MCHC: 33 g/dL (ref 30.0–36.0)
MCV: 85.3 fL (ref 78.0–100.0)
PLATELETS: 445 10*3/uL — AB (ref 150–400)
RBC: 3.41 MIL/uL — ABNORMAL LOW (ref 3.87–5.11)
RDW: 15.6 % — AB (ref 11.5–15.5)
WBC: 5.1 10*3/uL (ref 4.0–10.5)

## 2015-05-23 LAB — BASIC METABOLIC PANEL
Anion gap: 4 — ABNORMAL LOW (ref 5–15)
BUN: 8 mg/dL (ref 6–20)
CHLORIDE: 105 mmol/L (ref 101–111)
CO2: 27 mmol/L (ref 22–32)
CREATININE: 0.89 mg/dL (ref 0.44–1.00)
Calcium: 8.1 mg/dL — ABNORMAL LOW (ref 8.9–10.3)
GFR calc Af Amer: >60 mL/min (ref >60)
GFR calc non Af Amer: 55 mL/min — ABNORMAL LOW (ref >60)
GLUCOSE: 89 mg/dL (ref 65–99)
Potassium: 3.9 mmol/L (ref 3.5–5.1)
SODIUM: 136 mmol/L (ref 135–145)

## 2015-05-23 NOTE — Progress Notes (Signed)
Patient ID: Jo Hardin, female   DOB: 09/24/1921, 81 y.o.   MRN: [id number]   This is an acute visit.  Level care skilled.  Facility [facility].  Chief complaint acute visit status post hospitalization for encephalopathy-suspected pneumonia-sinusitis.  History of present illness.  Patient is a pleasant 81 year old female who presented to the hospital with increased confusion and unresponsiveness.  Was thought etiology was likely infectious in the setting of acute kidney injury.  With probable healthcare associated pneumonia  She initially presented with bradycardia and hypotension along with lethargy after being recently discharged to a skilled nursing facility status post hospitalization for pancreatitis in the ER patient was given 2 L of IV fluid labs showed acute renal failure mild elevation of lipase.  Apparently regards to bradycardia there was some belief that possibly she had been given a beta blocker inadvertently at the skilled nursing facility.  It was thought eventually acute encephalopathy unresponsiveness was due to an infectious process sinusitis and possible healthcare associated pneumonia with acute kidney injury.  Cardiac echo did show ejection fraction 60% and grade 1 diastolic dysfunction.  CT of the head did not show any acute abnormality.  EEG U was done and results are pending.  She was treated initially with vancomycin and Tressie Ellis make a mycin was discontinued she's been discharged on amoxicillin.  In regards acute kidney injury this resolved apparently with IV fluids creatinine today was 0.89 with a BUN of 8.  In regards to hypotension etiology was unclear her Cozaar initially was held it appears this is been restarted.  .  Patient did have mild hyponatremia sodium today is 136 this appears slightly improved from discharge sodium of 134.  Patient does have a history hypothyroidism TSH was 11.4 in the hospital her Synthroid was increased.  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  In regards to pancreatitis was recently hospitalized for acute pancreatitis appears she was thought to possibly have biller he pancreatitis-she did not have a ductal dilation.  There is thought she may have a possible defect in her biller retrieving likely will need another study in about 3 months.  Lipase at discharge was elevated but trending down.  Currently patient has no complaints she is resting in bed comfortably.  Previous medical history.  Acute encephalopathy resolve.  Hypothyroidism.  Possible healthcare associated pneumonia.  Sinusitis.  GERD.  Iron deficiency anemia.  Hypotension resolved.  Acute kidney injury resolved.   .  Previous surgical history.  Breast surgery.  .  Social history no smoking history smokeless tobacco history alcohol or illicit drug use.  Previously lived in a skilled nursing facility was able to participate in activities of daily living independently with some assistance.  Medications.  Amoxicillin 500 mg twice a day.  Ferrous sulfate 325 mg daily.  Synthroid 150 g daily.  Albuterol nebulizers every 6 hours when necessary.  Colace 100 mg every morning.  Flonase 1 spray each nostril twice a day.  Mucinex 60 mg twice a day.  Claritin 10 mg daily.  Cozaar 100 mg daily.  Magnesium oxide 4 mg daily.  Pataday eyedrops 1 drop both eyes daily.  Prilosec 20 mg daily.  Potassium 10 mEq twice a day.  Zocor 10 mg daily at bedtime.  Vitamin D 50,000 units every weekly.  Review of systems.  General  does not complaining any fever or chills.  Skin does not complain of rashes or itching.  Head ears eyes nose mouth and throat does not complain of visual changes or sore throat.  Respiratory completing  treatment for possible pneumonia and sinusitis does not complain of increased cough or shortness of breath.  Cardiac does not complain of chest pain.  GI is not complaining of abdominal discomfort nausea vomiting diarrhea or constipation.  Skeletal skeletal has weakness but does not complain of joint pain.  Neurologic is not complaining of dizziness or headache or syncopal-type episodes.  Psych does not complain of depression or anxiety appears to be in good spirits.  Physical exam.  Temperature 99.2 pulse 74 respirations 16 blood pressure 153/87.  In general this is a pleasant frail elderly female in no distress resting comfortably in bed.  Her skin is warm and dry.  Eyes she has prescription lenses sclera and conjunctiva are clear visual acuity appears grossly intact.  Oropharynx is clear mucous membranes moist.  She is edentulous.  Chest initially some very mild congestion this cleared with cough there is no labored breathing.  Heart is regular rate and rhythm without murmur gallop or rub she has mild lower extremity edema pedal pulses are intact bilaterally.  Abdomen is soft nontender positive bowel sounds somewhat protuberant.  Muscle skeletal moves all extremities 4 her strength appears to be intact she does have contractures of her fourth fingers bilaterally limited exam since patient is in bed she does have generalized frailty appears some stiffness of her lower extremities bilaterally.  Neurologic is grossly intact no lateralizing findings her speech is clear.  Psych she is alert and oriented pleasant and appropriate.  Labs.  05/23/2015.  Sodium 136 potassium 3.9 BUN 8 creatinine 0.89.  WBC 5.1 hemoglobin 9.6 platelets 445.  05/19/2015.  Liver function tests within normal limits except AST of 14-ALT of 7.  Assessment plan.  #1-history of encephalopathy this appears resolved will to be possibly caused by pneumonia or sinusiti-- is completing an antibiotic  clinically appears stable-she continues on nebulizers every 6 hours when necessary also is on Mucinex routinely.  #2 acute kidney injury this appears to have resolved as well with IV fluids this will have to be monitored creatinine 0.89 BUN 8 today this appears to be stable update this first laboratory day next week.  #3 history of pancreatitis with elevated lipase will recheck a lipase level next week again this was trending down on hospital discharge.  She will need follow-up by GI.  #4-history of hyponatremia this is quite mild improvement of IV fluids in the hospital will recheck this next week sodium 136 today.  #5 history hypothyroidism TSH was elevated in hospital Synthroid as been increased she will need an update TSH here in about 4 weeks.  #6-history of anemia thought to be iron deficient she is on iron Will monitor her CBC.   #7 history hypertension she is on Cozaar apparently there was some hypotension the hospital she is mildly hypertensive today this will have to be monitored would be hasn't increase her blood pressure medication secondary to hypotension in the hospital would like to get more readings.   #8-history of hypokalemia appears she is on low-dose potassium will update a metabolic panel next week potassium was 3.9 on lab done today apparently this has improved.  #9 history of GERD she is on a proton pump inhibitor.  #10 of a history of allergic rhinitis she is on Claritin as well as  Flonase.  [id number] note greater than 45 minutes spent assessing patient-reviewing her chart-and coordinating and formulating a plan of care for numerous diagnoses-of note greater than 50% of time spent coordinating plan of care

## 2015-05-25 ENCOUNTER — Non-Acute Institutional Stay (SKILLED_NURSING_FACILITY): Payer: Medicare Other | Admitting: Internal Medicine

## 2015-05-25 ENCOUNTER — Encounter: Payer: Self-pay | Admitting: Internal Medicine

## 2015-05-25 DIAGNOSIS — R531 Weakness: Secondary | ICD-10-CM

## 2015-05-25 DIAGNOSIS — N179 Acute kidney failure, unspecified: Secondary | ICD-10-CM | POA: Diagnosis not present

## 2015-05-25 DIAGNOSIS — G9340 Encephalopathy, unspecified: Secondary | ICD-10-CM

## 2015-05-25 DIAGNOSIS — E038 Other specified hypothyroidism: Secondary | ICD-10-CM

## 2015-05-25 DIAGNOSIS — J189 Pneumonia, unspecified organism: Secondary | ICD-10-CM

## 2015-05-25 DIAGNOSIS — R260 Ataxic gait: Secondary | ICD-10-CM | POA: Diagnosis not present

## 2015-05-25 NOTE — Progress Notes (Signed)
Patient ID: Jo Hardin, female   DOB: 06-10-1922, 81 y.o.   MRN: [id number]  Facility; [facility] Chief complaint; admission to SNF post admit to [facility] from 9/18 to 05/22/15. Patient was recently hospitalized for acute pancreatitis 9/3 through 05/09/15 and discharged to [facility].  History; patient was originally in hospital as noted with the acute pancreatitis felt to be secondary to papillary pancreatitis. She did not have ductal dilated patient on MRCP. Her lipase was trending downward at discharge. She apparently began to get ill about a week ago with some form of respiratory issue. She was put on antibiotics and nebulizers. On the day of transfer she was noted to be unresponsive. Initially on arrival noted to have lethargy bradycardia and hypotension. There is some suggestion from her grandson who is present today that she received the incorrect medications from another "[facility]" although I see no reference to this in her hospital discharge summary. He was felt to have acute renal failure possible pneumonia. Also possible sinusitis. His troponins were negative 2 echocardiogram showed mild LVH with an EF of 60% and grade 1 diastolic dysfunction. CT scan of the head was normal. An EEG was done and results are pending. She was treated with vancomycin and ceftazidine and discharged on amoxicillin. CT scan of the chest showed left lower lobe atelectasis and/or infiltrate.  BMP Latest Ref Rng 05/23/2015 05/22/2015 05/21/2015  Glucose 65 - 99 mg/dL 89 107(H) 108(H)  BUN 6 - 20 mg/dL 8 9 12   Creatinine 0.44 - 1.00 mg/dL 0.89 0.84 0.86  Sodium 135 - 145 mmol/L 136 134(L) 134(L)  Potassium 3.5 - 5.1 mmol/L 3.9 3.3(L) 3.7  Chloride 101 - 111 mmol/L 105 106 107  CO2 22 - 32 mmol/L 27 23 23   Calcium 8.9 - 10.3 mg/dL 8.1(L) 7.8(L) 7.8(L)   CBC Latest Ref Rng 05/23/2015 05/20/2015 05/19/2015  WBC 4.0 - 10.5 K/uL 5.1 5.2 4.8  Hemoglobin 12.0 - 15.0 g/dL 9.6(L) 8.7(L) 8.4(L)  Hematocrit 36.0 - 46.0 %  29.1(L) 26.4(L) 25.6(L)  Platelets 150 - 400 K/uL 445(H) 432(H) 367      EXAM: MRI ABDOMEN WITHOUT AND WITH CONTRAST (INCLUDING MRCP)   TECHNIQUE: Multiplanar multisequence MR imaging of the abdomen was performed both before and after the administration of intravenous contrast. Heavily T2-weighted images of the biliary and pancreatic ducts were obtained, and three-dimensional MRCP images were rendered by post processing.   CONTRAST:  19mL MULTIHANCE GADOBENATE DIMEGLUMINE 529 MG/ML IV SOLN   COMPARISON:  CT the abdomen and pelvis 05/03/2015.   FINDINGS: Comment: Study is limited by considerable patient respiratory motion.   Lower chest: Dependent increased signal intensity in the lower lobes of the lungs bilaterally is nonspecific, but likely to reflect areas of atelectasis. Cardiomegaly.   Hepatobiliary: MRCP images are severely limited by patient respiratory motion. With these limitations in mind, there does appear to be a focal narrowing of the proximal common hepatic duct, best appreciated on image 46 of series 13, and image 1 of series 101. On axial images, this region is poorly depicted, and simply appears decompressed, without overt intrinsic or extrinsic mass causing obstruction. There is very minimal intrahepatic biliary ductal dilatation. In segment 5 of the liver there is a more focal T2 signal intensity which predominantly appears very ductal in the central aspect of the liver, suggesting some focal periportal edema. In the more peripheral aspect of segment 5 there appears to be some mild peripheral intrahepatic biliary ductal dilatation, with a small amount of  surrounding enhancement, best appreciated on image 34 of series 5006. No discrete hepatic mass is confidently identified at this time. Gallbladder is moderately distended. No definite gallstones. No gallbladder wall thickening.   Pancreas: No pancreatic mass. No pancreatic ductal dilatation.  No pancreatic or peripancreatic fluid or inflammatory changes.   Spleen: Unremarkable.   Adrenals/Urinary Tract: Bilateral kidneys and bilateral adrenal glands are normal in appearance.   Stomach/Bowel: Visualized portions are unremarkable.   Vascular/Lymphatic: No aneurysm identified in the visualized abdominal vasculature. No lymphadenopathy noted in the abdomen.   Other: Trace volume of ascites.   Musculoskeletal: No aggressive osseous lesion noted in the visualized portions of the skeleton.   IMPRESSION: 1. Unusual appearance of the liver where there appears to be some mild peripheral intrahepatic biliary ductal dilatation, most evident in segment 5, where there also appears to be some focal periportal edema/inflammation. This is poorly evaluated on today's study secondary to extensive patient respiratory motion. The possibility of a focal area of cholangitis should be considered. While a centrally obstructing lesion is not excluded, particularly in light of the narrowing of the proximal common hepatic duct, a discrete lesion is not confidently identified on today's study. MRI imaging in this elderly individual who appears unable to adequately hold her breath for the examination is very sub optimal, and any future follow-up should be performed as a contrast-enhanced CT scan (which is less prone to motion related artifacts). At this time, clinical correlation is recommended. Should further imaging followup be clinically appropriate, repeat CT of the abdomen with and without IV gadolinium in 3-6 months could be performed to exclude the possibility of a aggressive process such as a cholangiocarcinoma, which is simply not detected on today's examination. 2. Trace volume of ascites.     Electronically Signed   By: Vinnie Langton M.D.   On: 05/06/2015 09:33         Result History      EXAM: CT ABDOMEN AND PELVIS WITH CONTRAST   TECHNIQUE: Multidetector CT imaging  of the abdomen and pelvis was performed using the standard protocol following bolus administration of intravenous contrast.   CONTRAST:  85mL OMNIPAQUE IOHEXOL 300 MG/ML SOLN, 67mL OMNIPAQUE IOHEXOL 300 MG/ML SOLN   COMPARISON:  None.   FINDINGS: Lower chest: Cardiac enlargement. Pulmonary artery enlargement. Mild atelectasis in the lung bases. No effusion or pneumonia.   Hepatobiliary: Linear hypodensity in the right lobe liver may represent Mild biliary dilatation. Central bile ducts mildly prominent however common bile duct is not dilated measuring 6 mm. Gallbladder wall shows hyper enhancement without significant thickening. There is fluid around the gallbladder which may be related to ascites. Small amount of ascites is also noted in the infrahepatic region. No liver mass lesion.   Pancreas: Mild peripancreatic edema raises the possibility of pancreatitis. No pancreatic mass or calcification. There is mild edema extending into the mesentery.   Spleen: Negative   Adrenals/Urinary Tract: Normal kidneys. No renal mass or obstruction. No renal calculi. Urinary bladder normal.   Stomach/Bowel: Negative for bowel obstruction. No significant bowel edema. Normal appendix.   Vascular/Lymphatic: Atherosclerotic aorta with tortuosity but no aneurysm.   Reproductive: Uterus not enlarged. Small uterine calcifications compatible with chronic fibroid.   Other: No free fluid in the pelvis. Mild amount of ascites around the liver and gallbladder. No adenopathy.   Musculoskeletal: Extensive disc degeneration and spondylosis diffusely. Negative for acute fracture. Schmorl's node inferior endplate of Y70   IMPRESSION: Mild ascites in the upper abdomen around  the gallbladder and liver.   Mild peripancreatic edema extending into the mesenteric, possibly due to pancreatitis.   Central bile ducts mildly prominent however common bile duct is normal at 6 mm. Linear branching  hypodensity in the right lobe liver may represent a mildly dilated bile duct.     Electronically Signed   By: Franchot Gallo M.D.   On: 05/03/2015 16:26       CLINICAL DATA:  Altered mental status today.   EXAM: CT HEAD WITHOUT CONTRAST   TECHNIQUE: Contiguous axial images were obtained from the base of the skull through the vertex without intravenous contrast.   COMPARISON:  10/28/2012   FINDINGS: No mass effect or midline shift. No evidence of acute intracranial hemorrhage, or infarction. No abnormal extra-axial fluid collections. Gray-white matter differentiation is normal. Basal cisterns are preserved. There is atrophy and chronic small vessel disease changes. Left cerebellar punctate hypoattenuation likely represents an old lacunar infarct.   No depressed skull fractures. Right sphenoid sinus is nearly completely opacified with air-fluid level accounted dependently. Visualized paranasal sinuses and mastoid air cells are otherwise not opacified.   IMPRESSION: No acute intracranial abnormality.   Atrophy, chronic microvascular disease.   Probable old left cerebellar lacunar infarct.   Right sphenoid sinusitis. The high density material within the sphenoid sinus raise a concern of possible fungal sinusitis.     Electronically Signed   By: Fidela Salisbury M.D.   On: 05/18/2015 18:40                Vitals       Height Weight BMI (Calculated)      4\' 11"  (1.499 m) 135 lb 5.8 oz (61.4 kg) 27.4         Interpretation Summary       CLINICAL DATA:  Altered mental status today.   EXAM: CT HEAD WITHOUT CONTRAST   TECHNIQUE: Contiguous axial images were obtained from the base of the skull through the vertex without intravenous contrast.   COMPARISON:  10/28/2012   FINDINGS: No mass effect or midline shift. No evidence of acute intracranial hemorrhage, or infarction. No abnormal extra-axial fluid collections. Gray-white matter differentiation is normal.  Basal cisterns are preserved. There is atrophy and chronic small vessel disease changes. Left cerebellar punctate hypoattenuation likely represents an old lacunar infarct.   No depressed skull fractures. Right sphenoid sinus is nearly completely opacified with air-fluid level accounted dependently. Visualized paranasal sinuses and mastoid air cells are otherwise not opacified.   IMPRESSION: No acute intracranial abnormality.   Atrophy, chronic microvascular disease.   Probable old left cerebellar lacunar infarct.   Right sphenoid sinusitis. The high density material within the sphenoid sinus raise a concern of possible fungal sinusitis.     Electronically Signed   By: Fidela Salisbury M.D.   On: 05/18/2015 18:40           CLINICAL DATA:  81 year old with cough, weakness and shortness of breath. Initial encounter.   EXAM: CT CHEST WITHOUT CONTRAST   TECHNIQUE: Multidetector CT imaging of the chest was performed following the standard protocol without IV contrast.   COMPARISON:  Radiographs 05/18/2015.  Abdominal CT 05/03/2015.   FINDINGS: Mediastinum/Nodes: There are no enlarged mediastinal, hilar or axillary lymph nodes.Right axillary surgical clips are noted. The thyroid gland, trachea and esophagus demonstrate no significant findings. The heart is mildly enlarged and there is a small pericardial effusion. There is relatively mild atherosclerosis of the aorta, great vessels and coronary arteries for age.  Lungs/Pleura: There is trace pleural fluid on the left. The lungs demonstrate diffuse central airway thickening with increased dependent opacity in the left lower lobe which may reflect atelectasis or a developing infiltrate.   Upper abdomen: Stable mild edematous changes. No acute findings demonstrated.   Musculoskeletal/Chest wall: There is no chest wall mass or suspicious osseous finding. There is a mild superior endplate compression deformity at T5,  age indeterminate. Chronic appearing inferior endplate Schmorl's node at T12 is unchanged from recent abdominal CT.   IMPRESSION: 1. New small left pleural effusion with increased left lower lobe atelectasis or early infiltrate. No consolidation. 2. Underlying chronic central airway thickening. 3. Mild atherosclerosis. 4. No evidence of metastatic breast cancer. 5. Age indeterminate mild T5 compression deformity.     Electronically Signed   By: Richardean Sale M.D.   On: 05/18/2015 20:54      Past Medical History  Diagnosis Date  . Arthritis   . Thyroid disease   . Cancer     had breast cancer 8 yrs ago  . Hypertension   . Hypomagnesemia   . GERD (gastroesophageal reflux disease)   . Pancreatitis   . Osteoarthritis   . Hypokalemia   . Pancreatitis, acute 05/03/2015  . 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 Anemia   . Bradycardia   . HCAP (healthcare-associated pneumonia)    Past Surgical History  Procedure Laterality Date  . Breast surgery    . Esophagogastroduodenoscopy N/A 05/07/2015    Procedure: ESOPHAGOGASTRODUODENOSCOPY (EGD);  Surgeon: Rogene Houston, MD;  Location: [facility];  Service: Endoscopy;  Laterality: N/A;    Current Outpatient Prescriptions on File Prior to Visit  Medication Sig Dispense Refill  . albuterol (PROVENTIL) (2.5 MG/3ML) 0.083% nebulizer solution Take 2.5 mg by nebulization every 6 (six) hours as needed for wheezing or shortness of breath.    . Amino Acids-Protein Hydrolys (FEEDING SUPPLEMENT, PRO-STAT SUGAR FREE 64,) LIQD Take 30 mLs by mouth 2 (two) times daily.    Marland Kitchen amoxicillin (AMOXIL) 250 MG capsule Take 1 capsule (250 mg total) by mouth 2 (two) times daily. 8 capsule 0  . docusate sodium (COLACE) 100 MG capsule Take 100 mg by mouth every morning.    . ferrous sulfate 325 (65 FE) MG tablet Take 1 tablet (325 mg total) by mouth daily with breakfast.    . fluticasone (FLONASE) 50 MCG/ACT nasal spray Place 1 spray  into the nose 2 (two) times daily.    Marland Kitchen guaiFENesin (MUCINEX) 600 MG 12 hr tablet Take 600 mg by mouth 2 (two) times daily.    Marland Kitchen levothyroxine (SYNTHROID, LEVOTHROID) 150 MCG tablet Take 1 tablet (150 mcg total) by mouth daily before breakfast. 30 tablet 2  . loratadine (CLARITIN) 10 MG tablet Take 10 mg by mouth daily.    Marland Kitchen losartan (COZAAR) 100 MG tablet Take 100 mg by mouth daily.    . magnesium oxide (MAG-OX) 400 MG tablet Take 400 mg by mouth daily.    . Olopatadine HCl (PATADAY) 0.2 % SOLN Place 1 drop into both eyes daily.    Marland Kitchen omeprazole (PRILOSEC) 20 MG capsule Take 20 mg by mouth daily.    . potassium chloride (K-DUR) 10 MEQ tablet Take 10 mEq by mouth 2 (two) times daily.    . simvastatin (ZOCOR) 10 MG tablet Take 10 mg by mouth at bedtime.    . Vitamin D, Ergocalciferol, (DRISDOL) 50000 UNITS CAPS Take 50,000 Units by mouth  every Tuesday.       Social: Patient lives with her daughter in [place]. Does not use an ambulatory assist device. Needs some assistance with bathing. Can walk a short distance even before she became ill.  reports that she has never smoked. She does not have any smokeless tobacco history on file. She reports that she does not drink alcohol or use illicit drugs.   Review of systems; Gen; no weight loss HEENT; patient has no visual or hearing limitations. She has dentures. No headache no dizziness Respiratory; no shortness of breath, positive cough Cardiac; no exertional chest pain, no palpitations GI; no abnormal pain, no change in bowel habits GU; no dysuria, no voiding difficulties Musculoskeletal; low back pain  Physical examination Gen.; the patient is alert conversational. Vitals; O2 sat 94% on room air respirations 20 and unlabored, occasional loose cough noted pulse rate 82 HEENT edentulous. No visual limitations Neck; no lymph no thyroid Respiratory; exam is clear no wheezing or crackles Cardiac heart sounds are normal. JVP is not  elevated she appears to be euvolemic Abdomen; no liver no spleen no stigmata of chronic liver disease there is no epigastric tenderness GU; bladder is not enlarged there is no CVA tenderness. Musculoskeletal; significant arthritis of both knees with ligamentous instability in the medial and lateral collaterals. There is no effusion but some warmth that predominantly the left knee Neurologic; cranial nerves seem normal. There is significant generalized weakness including the muscles of both shoulder girdles, hip flexion and abduction. She is hyporeflexic Gait; she is able to bring herself to a sitting position can stand with a fair amount of assistance of one person wide-based shuffling gait. Mental status; she seemed bright alert cognizant certainly no evidence of delirium Skin; I saw no issues in the usual problem areas  Impression/plan #1 delirium felt to be secondary to pneumonia, sphenoid sinusitis, renal insufficiency this seemed to have responded to antibiotics and fluids. I see no overt residual #2 acute renal insufficiency probably dehydration; this resolved #3 recently treated for biliary pancreatitis. Her lipase is come down to 75 when last checked. She has absolutely no symptoms or signs of this currently. #4 hypotension on presentation her losartan was initially held but then restarted. As noted there was some question from the family that she had received the wrong medications at the other facility where she was in. She was also bradycardic this is resolved the. She was seen by cardiology #5 hyponatremia I doubt this was significant #6 hypothyroidism her TSH was elevated and her Synthroid was increased. #7 gastroesophageal reflux disease seems stable #8 generalized weakness which is really quite significant but might just reflect the fact that this patient has been ill for 3 weeks now and in the hospital. She is on statins ACK probably should be checked but might of already been done in  the hospital.  The patient is going to require aggressive physical therapy for her ambulatory difficulties. She takes fairly maximal assistance of 1 to move from the bed to the chair. Lab work will be checked. I see no evidence currently of any residual delirium.

## 2015-05-26 ENCOUNTER — Non-Acute Institutional Stay (SKILLED_NURSING_FACILITY): Payer: Medicare Other | Admitting: Internal Medicine

## 2015-05-26 ENCOUNTER — Encounter (HOSPITAL_COMMUNITY)
Admission: RE | Admit: 2015-05-26 | Discharge: 2015-05-26 | Disposition: A | Discharge status: Home / Self Care | Payer: Medicare Other | Source: Skilled Nursing Facility | Attending: Internal Medicine | Admitting: Internal Medicine

## 2015-05-26 DIAGNOSIS — K851 Biliary acute pancreatitis without necrosis or infection: Secondary | ICD-10-CM

## 2015-05-26 DIAGNOSIS — R748 Abnormal levels of other serum enzymes: Secondary | ICD-10-CM | POA: Diagnosis not present

## 2015-05-26 LAB — COMPREHENSIVE METABOLIC PANEL
ALK PHOS: 43 U/L (ref 38–126)
ALT: 9 U/L — AB (ref 14–54)
AST: 19 U/L (ref 15–41)
Albumin: 2.5 g/dL — ABNORMAL LOW (ref 3.5–5.0)
Anion gap: 7 (ref 5–15)
BILIRUBIN TOTAL: 0.4 mg/dL (ref 0.3–1.2)
BUN: 18 mg/dL (ref 6–20)
CALCIUM: 8 mg/dL — AB (ref 8.9–10.3)
CO2: 26 mmol/L (ref 22–32)
CREATININE: 0.83 mg/dL (ref 0.44–1.00)
Chloride: 103 mmol/L (ref 101–111)
GFR, EST NON AFRICAN AMERICAN: 60 mL/min — AB (ref >60)
Glucose, Bld: 94 mg/dL (ref 65–99)
Potassium: 4.3 mmol/L (ref 3.5–5.1)
Sodium: 136 mmol/L (ref 135–145)
TOTAL PROTEIN: 6 g/dL — AB (ref 6.5–8.1)

## 2015-05-26 LAB — LIPASE, BLOOD: LIPASE: 115 U/L — AB (ref 22–51)

## 2015-05-26 NOTE — Progress Notes (Signed)
Patient ID: Jo Hardin, female   DOB: [birth year], 81 y.o.   MRN: [id number] Facility; [facility] Chief complaint review of labs elevated lipase level History; this is a patient I admitted to the facility yesterday. Her index hospitalization before her arrival here was secondary to an acute at encephalopathy/unresponsive episode. Some suggestion that she was given the wrong medications had a previous skilled facility although I don't know if that was actually proven. Specifically a beta blocker. She was also felt to have healthcare acquired pneumonia with a CT scan showing left lower lobe atelectasis or infiltrate. She also had prerenal azotemia occurring correcting with IV fluids.  The patient was previously in the hospital with attack of biliary pancreatitis. She had an MRCP that did not show ductal dilation. She was discharged with her lipase trending down from 141. It was 75 when checked before she left the hospital on this occasion but now seems to of gone back up to 115. The patient denies abdominal pain or vomiting although she seems to not eat very much due to fear of nausea. She also has significant protein calorie malnutrition with an albumen of 2.5 reflecting her ongoing recent illnesses no doubt. She is not eating well per description of her family  Results for Jo Hardin (MRN [id number]) as of 05/26/2015 09:31  Ref. Range 05/20/2015 12:00 05/21/2015 06:51 05/22/2015 09:14 05/23/2015 07:40 05/26/2015 06:30  Sodium Latest Ref Range: 135-145 mmol/L  134 (L) 134 (L) 136 136  Potassium Latest Ref Range: 3.5-5.1 mmol/L  3.7 3.3 (L) 3.9 4.3  Chloride Latest Ref Range: 101-111 mmol/L  107 106 105 103  CO2 Latest Ref Range: 22-32 mmol/L  23 23 27 26   BUN Latest Ref Range: 6-20 mg/dL  12 9 8 18   Creatinine Latest Ref Range: 0.44-1.00 mg/dL  0.86 0.84 0.89 0.83  Calcium Latest Ref Range: 8.9-10.3 mg/dL  7.8 (L) 7.8 (L) 8.1 (L) 8.0 (L)  EGFR (Non-African Amer.) Latest Ref Range: >60 mL/min  57 (L) 59 (L) 55  (L) 60 (L)  EGFR (African American) Latest Ref Range: >60 mL/min  >60 >60 >60 >60  Glucose Latest Ref Range: 65-99 mg/dL  108 (H) 107 (H) 89 94  Anion gap Latest Ref Range: 5-15   4 (L) 5 4 (L) 7  Alkaline Phosphatase Latest Ref Range: 38-126 U/L     43  Albumin Latest Ref Range: 3.5-5.0 g/dL     2.5 (L)  Lipase Latest Ref Range: 22-51 U/L  75 (H)   115 (H)  AST Latest Ref Range: 15-41 U/L     19  ALT Latest Ref Range: 14-54 U/L     9 (L)  Total Protein Latest Ref Range: 6.5-8.1 g/dL     6.0 (L)  Total Bilirubin Latest Ref Range: 0.3-1.2 mg/dL     0.4  WBC Latest Ref Range: 4.0-10.5 K/uL    5.1   RBC Latest Ref Range: 3.87-5.11 MIL/uL    3.41 (L)   Hemoglobin Latest Ref Range: 12.0-15.0 g/dL    9.6 (L)   HCT Latest Ref Range: 36.0-46.0 %    29.1 (L)   MCV Latest Ref Range: 78.0-100.0 fL    85.3   MCH Latest Ref Range: 26.0-34.0 pg    28.2   MCHC Latest Ref Range: 30.0-36.0 g/dL    33.0   RDW Latest Ref Range: 11.5-15.5 %    15.6 (H)   Platelets Latest Ref Range: 150-400 K/uL    445 (H)   Legionella Antigen, Urine  Unknown Negative for Legi...      Strep Pneumo Urinary Antigen Latest Ref Range: NEGATIVE  NEGATIVE         Review of systems Gen.; the patient states she feels reasonably well Respiratory continued loose cough but no shortness of breath or pleuritic chest pain Cardiac no chest pain GI no abdominal pain vomiting. She does complain of anorexia, postprandial nausea GU no dysuria Extremities; no edema or pain.  Physical examination Gen. the patient does not look ill Vitals; O2 sat 95% on room air respirations 22 pulse rate 87 Respiratory; clear entry bilaterally, no crackles no wheezing no accessory muscle use work of breathing is normal Cardiac heart sounds are normal there is an early 2/6 systolic ejection murmur that does not radiate. JVP is not elevated Abdomen; not distended bowel sounds are positive no liver spleen or masses there is absolutely no tenderness to light  or deep palpation GU bladder is not distended there is no CVA tenderness Extremities; mild edema  Impression/plan #1 mild elevation of the patient's lipase level. I'm going to simply follow this for now. If this continues on an upward trend then repeat pancreatic imaging [especially if she becomes more symptomatic] and shipped to see the gastroenterologist will be in order. Right now her exam including her abdominal exam is totally benign. #2 she is on K Dur 10 mEq twice a day and think this can be reduced or stopped it. She is also on magnesium which was 1.4 earlier this month. I suspect this can probably stop it. #3 continued loose cough this may be related to her recent healthcare acquired pneumonia.

## 2015-05-31 NOTE — Procedures (Signed)
  Violet A. Merlene Laughter, MD     [email or web address]           HISTORY: The patient is 81 years old. She presents with altered mental status and confusion. The study is being done to evaluate for seizures as a cause of these events.   MEDICATIONS: Scheduled Meds: Continuous Infusions: PRN Meds:.  Prior to Admission medications   Medication Sig Start Date End Date Taking? Authorizing Provider  albuterol (PROVENTIL) (2.5 MG/3ML) 0.083% nebulizer solution Take 2.5 mg by nebulization every 6 (six) hours as needed for wheezing or shortness of breath.    Historical Provider, MD  Amino Acids-Protein Hydrolys (FEEDING SUPPLEMENT, PRO-STAT SUGAR FREE 64,) LIQD Take 30 mLs by mouth 2 (two) times daily.    Historical Provider, MD  amoxicillin (AMOXIL) 250 MG capsule Take 1 capsule (250 mg total) by mouth 2 (two) times daily. 05/22/15   Radene Gunning, NP  docusate sodium (COLACE) 100 MG capsule Take 100 mg by mouth every morning.    Historical Provider, MD  ferrous sulfate 325 (65 FE) MG tablet Take 1 tablet (325 mg total) by mouth daily with breakfast. 05/22/15   Radene Gunning, NP  fluticasone (FLONASE) 50 MCG/ACT nasal spray Place 1 spray into the nose 2 (two) times daily.    Historical Provider, MD  guaiFENesin (MUCINEX) 600 MG 12 hr tablet Take 600 mg by mouth 2 (two) times daily.    Historical Provider, MD  levothyroxine (SYNTHROID, LEVOTHROID) 150 MCG tablet Take 1 tablet (150 mcg total) by mouth daily before breakfast. 05/22/15   Radene Gunning, NP  loratadine (CLARITIN) 10 MG tablet Take 10 mg by mouth daily.    Historical Provider, MD  losartan (COZAAR) 100 MG tablet Take 100 mg by mouth daily.    Historical Provider, MD  magnesium oxide (MAG-OX) 400 MG tablet Take 400 mg by mouth daily.    Historical Provider, MD  Olopatadine HCl (PATADAY) 0.2 % SOLN Place 1 drop into both eyes daily.    Historical Provider, MD  omeprazole (PRILOSEC) 20 MG capsule Take 20 mg by mouth daily.     Historical Provider, MD  potassium chloride (K-DUR) 10 MEQ tablet Take 10 mEq by mouth 2 (two) times daily.    Historical Provider, MD  simvastatin (ZOCOR) 10 MG tablet Take 10 mg by mouth at bedtime.    Historical Provider, MD  Vitamin D, Ergocalciferol, (DRISDOL) 50000 UNITS CAPS Take 50,000 Units by mouth every Tuesday.     Historical Provider, MD      ANALYSIS: A 16 channel recording using standard 10 20 measurements is conducted for 21 minutes.  There is a posterior dominant rhythm of 8-1/2 Hz which attenuates with eye opening. There is beta activity observed in the frontal areas. There is significant artifact involving the right occipital lead. Awake and the drowsy activities are observed. Photic stimulation and hyperventilation are not carried out. No focal or lateralized slowing is observed. There is no epileptiform activity observed.    IMPRESSION: This a normal recording awake and drowsy states.      Afra Tricarico A. Merlene Laughter, M.D.  Diplomate, Tax adviser of Psychiatry and Neurology ( Neurology).

## 2015-06-02 ENCOUNTER — Non-Acute Institutional Stay (SKILLED_NURSING_FACILITY): Payer: Medicare Other | Admitting: Internal Medicine

## 2015-06-02 ENCOUNTER — Encounter (HOSPITAL_COMMUNITY)
Admission: RE | Admit: 2015-06-02 | Discharge: 2015-06-02 | Disposition: A | Discharge status: Home / Self Care | Payer: Medicare Other | Source: Skilled Nursing Facility | Attending: Internal Medicine | Admitting: Internal Medicine

## 2015-06-02 DIAGNOSIS — K851 Biliary acute pancreatitis without necrosis or infection: Secondary | ICD-10-CM

## 2015-06-02 DIAGNOSIS — D649 Anemia, unspecified: Secondary | ICD-10-CM | POA: Insufficient documentation

## 2015-06-02 DIAGNOSIS — I10 Essential (primary) hypertension: Secondary | ICD-10-CM | POA: Insufficient documentation

## 2015-06-02 DIAGNOSIS — R059 Cough, unspecified: Secondary | ICD-10-CM

## 2015-06-02 DIAGNOSIS — R748 Abnormal levels of other serum enzymes: Secondary | ICD-10-CM

## 2015-06-02 DIAGNOSIS — R05 Cough: Secondary | ICD-10-CM

## 2015-06-02 DIAGNOSIS — E039 Hypothyroidism, unspecified: Secondary | ICD-10-CM | POA: Insufficient documentation

## 2015-06-02 LAB — CBC
HCT: 26.7 % — ABNORMAL LOW (ref 36.0–46.0)
Hemoglobin: 8.7 g/dL — ABNORMAL LOW (ref 12.0–15.0)
MCH: 28.7 pg (ref 26.0–34.0)
MCHC: 32.6 g/dL (ref 30.0–36.0)
MCV: 88.1 fL (ref 78.0–100.0)
PLATELETS: 284 10*3/uL (ref 150–400)
RBC: 3.03 MIL/uL — AB (ref 3.87–5.11)
RDW: 16.2 % — AB (ref 11.5–15.5)
WBC: 6.9 10*3/uL (ref 4.0–10.5)

## 2015-06-02 LAB — COMPREHENSIVE METABOLIC PANEL
ALBUMIN: 2.6 g/dL — AB (ref 3.5–5.0)
ALT: 18 U/L (ref 14–54)
AST: 23 U/L (ref 15–41)
Alkaline Phosphatase: 43 U/L (ref 38–126)
Anion gap: 4 — ABNORMAL LOW (ref 5–15)
BUN: 27 mg/dL — AB (ref 6–20)
CHLORIDE: 107 mmol/L (ref 101–111)
CO2: 25 mmol/L (ref 22–32)
CREATININE: 0.92 mg/dL (ref 0.44–1.00)
Calcium: 8 mg/dL — ABNORMAL LOW (ref 8.9–10.3)
GFR calc Af Amer: >60 mL/min (ref >60)
GFR, EST NON AFRICAN AMERICAN: 53 mL/min — AB (ref >60)
GLUCOSE: 93 mg/dL (ref 65–99)
POTASSIUM: 4.7 mmol/L (ref 3.5–5.1)
SODIUM: 136 mmol/L (ref 135–145)
Total Bilirubin: 0.4 mg/dL (ref 0.3–1.2)
Total Protein: 6.4 g/dL — ABNORMAL LOW (ref 6.5–8.1)

## 2015-06-02 LAB — LIPASE, BLOOD: LIPASE: 72 U/L — AB (ref 22–51)

## 2015-06-03 NOTE — Progress Notes (Addendum)
Patient ID: Jo Hardin, female   DOB: [birth year], 81 y.o.   MRN: [id number]                PROGRESS NOTE  DATE:  06/02/2015        FACILITY: [facility]                    LEVEL OF CARE:   SNF   Acute Visit                  CHIEF COMPLAINT:  Shortness of breath/coughing.      HISTORY OF PRESENT ILLNESS:  Jo Hardin is a patient who came to [place] after a complex series of hospitalizations from 05/03/2015 through 05/09/2015, then again from 05/18/2015 through 05/22/2015.    The initial hospitalization was for biliary pancreatitis.  An MRI of the abdomen showed no pancreatic mass.  There was mild ascites in the upper abdomen and around the gallbladder and the liver.  Central bile ducts were mildly prominent.  There was mild peripancreatic edema on an original CT scan.  However, follow-up MRI did not show a pancreatic mass.  No pancreatic ductal dilation.  No pancreatic or peripancreatic fluid or inflammatory changes.  The gallbladder was mildly distended, but there were no definite gallstones and no gallbladder wall thickening.    The patient was then hospitalized again with acute delirium, felt to be secondary to pneumonia.  CT scan of the head was normal.  She seemed to recover with antibiotics.    CURRENT MEDICATIONS:  Medication list is reviewed.          Claritin 10 q.d.      Colace 100 daily.     Cozaar 100 daily.     Vitamin D2, 50,000 U once a week on Tuesday.     Ferrous sulfate 325 once a day.    Flonase 1 spray into the nose b.i.d.      Magnesium oxide 400 q.d.      Omeprazole 20 q.d.      Pataday ophthalmic.      Kcl 10 mEq twice a day.      Synthroid 150 daily.     Zocor 10 q.d.       LABORATORY DATA:   Lab work from today:      Sodium 136, potassium 4.7, BUN 27, creatinine 0.92.     Liver function tests normal with an alk phos of 43.    Albumin 2.6.      Lipase still elevated at 72, although down from 115 on 05/26/2015.   Previous lipase was  75 and, before that, 94 on 05/19/2015.      REVIEW OF SYSTEMS:    CHEST/RESPIRATORY:  The patient denies cough or shortness of breath.   CARDIAC:  No chest pain.   GI:  No abdominal pain or diarrhea.     GU:  No dysuria.     PHYSICAL EXAMINATION:   VITAL SIGNS:     PULSE:  82.    RESPIRATIONS:  18 and unlabored.       02 SATURATIONS:  95% on room air.     CHEST/RESPIRATORY:  Exam is clear.        CARDIOVASCULAR:   CARDIAC:  Heart sounds are normal.  There are no signs of heart failure.  There is a 2/6 systolic ejection murmur.        GASTROINTESTINAL:   ABDOMEN:  Nontender.  No masses are noted.  LIVER/SPLEEN/KIDNEYS:  No liver, no spleen.   CIRCULATION:   EDEMA/VARICOSITIES:  Extremities:  Mild pedal edema.   No signs of a DVT.     ASSESSMENT/PLAN:          Pancreatitis.  She still has elevation of her lipase.  I am not exactly sure why this should be.  Her MRI showed no pancreatic mass in follow-up from her original hospitalization.  She is completely asymptomatic from that point of view and I am not planning to chase this down in a 81 year-old woman unless she becomes symptomatic.    Cough.   Her lungs sound clear.   No wheezing.   Respiratory effort is normal.  She seems stable for the moment.

## 2015-06-18 ENCOUNTER — Encounter (HOSPITAL_COMMUNITY)
Admission: AD | Admit: 2015-06-18 | Discharge: 2015-06-18 | Disposition: A | Discharge status: Home / Self Care | Payer: Medicare Other | Source: Skilled Nursing Facility | Attending: Internal Medicine | Admitting: Internal Medicine

## 2015-06-18 LAB — TSH: TSH: 2.3 u[IU]/mL (ref 0.350–4.500)

## 2015-06-23 ENCOUNTER — Other Ambulatory Visit: Payer: Self-pay | Admitting: *Deleted

## 2015-06-23 MED ORDER — TRAMADOL HCL 50 MG PO TABS: ORAL_TABLET | ORAL | Status: DC

## 2015-06-23 NOTE — Telephone Encounter (Signed)
[facility] 

## 2015-06-26 ENCOUNTER — Non-Acute Institutional Stay (SKILLED_NURSING_FACILITY): Payer: Medicare Other | Admitting: Internal Medicine

## 2015-06-26 ENCOUNTER — Encounter: Payer: Self-pay | Admitting: Internal Medicine

## 2015-06-26 DIAGNOSIS — D509 Iron deficiency anemia, unspecified: Secondary | ICD-10-CM | POA: Diagnosis not present

## 2015-06-26 DIAGNOSIS — G9340 Encephalopathy, unspecified: Secondary | ICD-10-CM

## 2015-06-26 DIAGNOSIS — J189 Pneumonia, unspecified organism: Secondary | ICD-10-CM

## 2015-06-26 DIAGNOSIS — I10 Essential (primary) hypertension: Secondary | ICD-10-CM

## 2015-06-26 DIAGNOSIS — N179 Acute kidney failure, unspecified: Secondary | ICD-10-CM

## 2015-06-26 NOTE — Progress Notes (Signed)
Patient ID: Jo Hardin, female   DOB: 03-04-1922, 81 y.o.   MRN: [id number]     This is a discharge note.  Level care skilled.  Facility [facility].  Chief complaint-discharge note.  History of present illness.  Patient is a pleasant 81 year old female who presented to the hospital with increased confusion and unresponsiveness.  Was thought etiology was likely infectious in the setting of acute kidney injury.  With probable healthcare associated pneumonia  She initially presented with bradycardia and hypotension along with lethargy after being recently discharged to a skilled nursing facility status post hospitalization for pancreatitis-- in the ER patient was given 2 L of IV fluid labs showed acute renal failure mild elevation of lipase.  Apparently regards to bradycardia there was some belief that possibly she had been given a beta blocker inadvertently at the skilled nursing facility.  It was thought eventually acute encephalopathy unresponsiveness was due to an infectious process sinusitis and possible healthcare associated pneumonia with acute kidney injury.  Cardiac echo did show ejection fraction 60% and grade 1 diastolic dysfunction.  CT of the head did not show any acute abnormality.   She was treated initially with vancomycin and Tressie Ellis make a mycin was discontinued she's been discharged on amoxicillin.  In regards acute kidney injury this resolved apparently with IV fluids creatinine today was 0.89 with a BUN of 8.  In regards to hypotension etiology was unclear her Cozaar initially was held it appears this is been restarted.  .  Patient did have mild hyponatremia sodium today is 136 this appears slightly improved from discharge sodium of 134.  Patient does have a history hypothyroidism TSH was 11.4 in the hospital her Synthroid was increased.  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  In regards to pancreatitis was recently hospitalized for acute pancreatitis appears she was thought to possibly have biller he pancreatitis-she did not have a ductal dilation.  There is thought she may have a possible defect in her biller retrieving likely will need another study in about 3 months.  Lipase at discharge was elevated but trending down--it was down to 72 on October 3.  Currently patient has no complaints she will be going home with family she lives with her daughter.  She still has significant weakness and will need PT and OT for strengthening.  Previous medical history.  Acute encephalopathy resolve.  Hypothyroidism.  Possible healthcare associated pneumonia.  Sinusitis.  GERD.  Iron deficiency anemia.  Hypotension resolved.  Acute kidney injury resolved.   .  Previous surgical history.  Breast surgery.  .  Social history no smoking history smokeless tobacco history alcohol or illicit drug use.  Previously lived in a skilled nursing facility was able to participate in activities of daily living independently with some assistance.  Medications..  Ferrous sulfate 325 mg daily.  Synthroid 150 g daily.  Albuterol nebulizers every 6 hours when necessary.  Colace 100 mg every morning.  Flonase 1 spray each nostril twice a day.  Mucinex 600 mg twice a day.  Claritin 10 mg daily.  Cozaar 100 mg daily.  Magnesium oxide 4 mg daily.  Pataday eyedrops 1 drop both eyes daily.  Prilosec 20 mg daily.  Potassium 10 mEq twice a day.  Zocor 10 mg daily at bedtime.  Vitamin D 50,000 units every weekly.  Review  of systems.  General does not complaining any fever or chills.  Skin does not complain of rashes or itching.  Head ears eyes nose mouth and throat does not complain of visual changes or sore throat.  Respiratory --has completed  treatment for  possible pneumonia and sinusitis does not complain of increased cough or shortness of breath.  Cardiac does not complain of chest pain.  GI is not complaining of abdominal discomfort nausea vomiting diarrhea or constipation.  Muscle skeletal has weakness but does not complain of joint pain.  Neurologic is not complaining of dizziness or headache or syncopal-type episodes.  Psych does not complain of depression or anxiety appears to be in good spirits.  Physical exam. . Temperature 98.3 pulse 63 respirations 20 blood pressure 111/65 In general this is a pleasant frail elderly female in no distress   Her skin is warm and dry.  Eyes she has prescription lenses sclera and conjunctiva are clear visual acuity appears grossly intact.  Oropharynx is clear mucous membranes moist.  She is edentulous.  Chest-clear to auscultation no labored breathing.  Heart is regular rate and rhythm without murmur gallop or rub she has mild lower extremity edema pedal pulses are intact bilaterally.  Abdomen is soft nontender positive bowel sounds somewhat protuberant.  Muscle skeletal moves all extremities 4 her strength appears to be intact she does have contractures of her fourth fingers bilaterally She was able to stand without assistance and l use her walker--but is somewhat weak-unsteady  Neurologic is grossly intact no lateralizing findings her speech is clear.  Psych she is alert and oriented pleasant and appropriate.  Labs   06/18/2015.  [id number].  06/02/2015.  WBC 6.9 hemoglobin 8.7 platelets 284.  Sodium 136 potassium 4.7 BUN 27 creatinine 0.92.  Albumin 2.6 otherwise liver function tests within normal limits.  Lipase level 72   it had been high 115 on 05/26/2015.  05/23/2015.  Sodium 136 potassium 3.9 BUN 8 creatinine 0.89.  WBC 5.1 hemoglobin 9.6 platelets 445.  05/19/2015.  Liver function tests within normal limits except AST of 14-ALT of 7.  Assessment  plan.  #1-history of encephalopathy this appears resolved will to be possibly caused by pneumonia or sinusiti--has completed an antibiotic clinically appears stable-she continues on nebulizers every 6 hours when necessary also is on Mucinex routinely.  #2 acute kidney injury this appears to have resolved as well with IV fluids She recently creatinine 0.92 BUN of 27 on October 3 will update this.  #3 history of pancreatitis with elevated lipase  Lipase appears to be trending down clinically she's been stable--and asymptomatic.  She will need follow-up by GI.  #4-history of hyponatremia--this appears to have normalize most recently 136 on 06/02/2015 will update this  #5 history hypothyroidism TSH was elevated in hospital Synthroid was increased and appears her TSH has normalized at 2.3 as of 06/18/2015  #6-history of anemia thought to be iron deficient she is on iron most recent hemoglobin 8.7 which appears to be on the lower end of her recent baseline Will update this will need follow-up by primary care provider.   #7 history hypertension she is on Cozaar apparently there was some hypotension the hospital -136/79-109/62 this appears to be fairly baseline.   #8-history of hypokalemia appears she is on low-dose potassium  This appears to be normal at 4.7 on lab done 06/02/2015 will update  #9 history of GERD she is on a proton pump inhibitor.  #10 of a history of allergic rhinitis she  is on Claritin as well as Flonase.   Patient appears to have done well although she has significant weakness again will need continued PT and OT-she does live with her daughter.    [id number] note greater than 30 minutes spent on this discharge summary-greater than 50% of time spent coordinating plan of care

## 2015-06-27 ENCOUNTER — Encounter (HOSPITAL_COMMUNITY)
Admission: AD | Admit: 2015-06-27 | Discharge: 2015-06-27 | Disposition: A | Discharge status: Home / Self Care | Payer: Medicare Other | Source: Skilled Nursing Facility | Attending: Internal Medicine | Admitting: Internal Medicine

## 2015-06-27 LAB — CBC WITH DIFFERENTIAL/PLATELET
BASOS PCT: 1 %
Basophils Absolute: 0 10*3/uL (ref 0.0–0.1)
Eosinophils Absolute: 0.2 10*3/uL (ref 0.0–0.7)
Eosinophils Relative: 6 %
HEMATOCRIT: 26.8 % — AB (ref 36.0–46.0)
Hemoglobin: 8.7 g/dL — ABNORMAL LOW (ref 12.0–15.0)
LYMPHS PCT: 45 %
Lymphs Abs: 1.6 10*3/uL (ref 0.7–4.0)
MCH: 28.9 pg (ref 26.0–34.0)
MCHC: 32.5 g/dL (ref 30.0–36.0)
MCV: 89 fL (ref 78.0–100.0)
MONO ABS: 0.4 10*3/uL (ref 0.1–1.0)
MONOS PCT: 12 %
NEUTROS ABS: 1.3 10*3/uL — AB (ref 1.7–7.7)
Neutrophils Relative %: 36 %
Platelets: 238 10*3/uL (ref 150–400)
RBC: 3.01 MIL/uL — ABNORMAL LOW (ref 3.87–5.11)
RDW: 16.1 % — AB (ref 11.5–15.5)
WBC: 3.6 10*3/uL — ABNORMAL LOW (ref 4.0–10.5)

## 2015-06-27 LAB — BASIC METABOLIC PANEL
ANION GAP: 5 (ref 5–15)
BUN: 23 mg/dL — AB (ref 6–20)
CALCIUM: 8.7 mg/dL — AB (ref 8.9–10.3)
CO2: 26 mmol/L (ref 22–32)
Chloride: 108 mmol/L (ref 101–111)
Creatinine, Ser: 0.7 mg/dL (ref 0.44–1.00)
GFR calc Af Amer: >60 mL/min (ref >60)
GFR calc non Af Amer: >60 mL/min (ref >60)
GLUCOSE: 89 mg/dL (ref 65–99)
Potassium: 3.9 mmol/L (ref 3.5–5.1)
Sodium: 139 mmol/L (ref 135–145)

## 2015-07-02 DIAGNOSIS — K859 Acute pancreatitis without necrosis or infection, unspecified: Secondary | ICD-10-CM

## 2015-07-02 DIAGNOSIS — M150 Primary generalized (osteo)arthritis: Secondary | ICD-10-CM | POA: Diagnosis not present

## 2015-07-02 DIAGNOSIS — R262 Difficulty in walking, not elsewhere classified: Secondary | ICD-10-CM

## 2015-07-02 DIAGNOSIS — D649 Anemia, unspecified: Secondary | ICD-10-CM

## 2015-07-07 ENCOUNTER — Encounter: Payer: Self-pay | Admitting: Internal Medicine

## 2015-07-07 NOTE — Progress Notes (Signed)
This encounter was created in error - please disregard.

## 2015-10-27 DIAGNOSIS — I10 Essential (primary) hypertension: Secondary | ICD-10-CM | POA: Diagnosis not present

## 2015-10-27 DIAGNOSIS — K219 Gastro-esophageal reflux disease without esophagitis: Secondary | ICD-10-CM | POA: Diagnosis not present

## 2015-10-27 DIAGNOSIS — E039 Hypothyroidism, unspecified: Secondary | ICD-10-CM | POA: Diagnosis not present

## 2015-10-27 DIAGNOSIS — M170 Bilateral primary osteoarthritis of knee: Secondary | ICD-10-CM | POA: Diagnosis not present

## 2015-11-18 DIAGNOSIS — K219 Gastro-esophageal reflux disease without esophagitis: Secondary | ICD-10-CM | POA: Diagnosis not present

## 2015-11-18 DIAGNOSIS — M170 Bilateral primary osteoarthritis of knee: Secondary | ICD-10-CM | POA: Diagnosis not present

## 2015-11-18 DIAGNOSIS — I10 Essential (primary) hypertension: Secondary | ICD-10-CM | POA: Diagnosis not present

## 2015-11-18 DIAGNOSIS — E039 Hypothyroidism, unspecified: Secondary | ICD-10-CM | POA: Diagnosis not present

## 2016-01-08 DIAGNOSIS — M170 Bilateral primary osteoarthritis of knee: Secondary | ICD-10-CM | POA: Diagnosis not present

## 2016-01-08 DIAGNOSIS — E039 Hypothyroidism, unspecified: Secondary | ICD-10-CM | POA: Diagnosis not present

## 2016-01-08 DIAGNOSIS — E785 Hyperlipidemia, unspecified: Secondary | ICD-10-CM | POA: Diagnosis not present

## 2016-01-08 DIAGNOSIS — I10 Essential (primary) hypertension: Secondary | ICD-10-CM | POA: Diagnosis not present

## 2016-01-20 ENCOUNTER — Emergency Department (HOSPITAL_COMMUNITY): Payer: Medicare Other

## 2016-01-20 ENCOUNTER — Inpatient Hospital Stay (HOSPITAL_COMMUNITY)
Admission: EM | Admit: 2016-01-20 | Discharge: 2016-01-27 | DRG: 446 | Disposition: A | Discharge status: Skilled Nursing Facility | Payer: Medicare Other | Attending: Internal Medicine | Admitting: Internal Medicine

## 2016-01-20 ENCOUNTER — Encounter (HOSPITAL_COMMUNITY): Payer: Self-pay

## 2016-01-20 DIAGNOSIS — K831 Obstruction of bile duct: Secondary | ICD-10-CM | POA: Diagnosis not present

## 2016-01-20 DIAGNOSIS — M170 Bilateral primary osteoarthritis of knee: Secondary | ICD-10-CM | POA: Diagnosis not present

## 2016-01-20 DIAGNOSIS — I959 Hypotension, unspecified: Secondary | ICD-10-CM | POA: Diagnosis present

## 2016-01-20 DIAGNOSIS — K2970 Gastritis, unspecified, without bleeding: Secondary | ICD-10-CM | POA: Diagnosis not present

## 2016-01-20 DIAGNOSIS — Z66 Do not resuscitate: Secondary | ICD-10-CM | POA: Diagnosis present

## 2016-01-20 DIAGNOSIS — I10 Essential (primary) hypertension: Secondary | ICD-10-CM | POA: Diagnosis present

## 2016-01-20 DIAGNOSIS — K838 Other specified diseases of biliary tract: Secondary | ICD-10-CM | POA: Diagnosis not present

## 2016-01-20 DIAGNOSIS — R1084 Generalized abdominal pain: Secondary | ICD-10-CM | POA: Diagnosis not present

## 2016-01-20 DIAGNOSIS — M25462 Effusion, left knee: Secondary | ICD-10-CM | POA: Diagnosis not present

## 2016-01-20 DIAGNOSIS — K828 Other specified diseases of gallbladder: Secondary | ICD-10-CM | POA: Diagnosis not present

## 2016-01-20 DIAGNOSIS — R7989 Other specified abnormal findings of blood chemistry: Secondary | ICD-10-CM | POA: Diagnosis not present

## 2016-01-20 DIAGNOSIS — M6281 Muscle weakness (generalized): Secondary | ICD-10-CM | POA: Diagnosis not present

## 2016-01-20 DIAGNOSIS — M25552 Pain in left hip: Secondary | ICD-10-CM | POA: Diagnosis present

## 2016-01-20 DIAGNOSIS — M1990 Unspecified osteoarthritis, unspecified site: Secondary | ICD-10-CM | POA: Diagnosis present

## 2016-01-20 DIAGNOSIS — R932 Abnormal findings on diagnostic imaging of liver and biliary tract: Secondary | ICD-10-CM | POA: Diagnosis not present

## 2016-01-20 DIAGNOSIS — E861 Hypovolemia: Secondary | ICD-10-CM | POA: Diagnosis present

## 2016-01-20 DIAGNOSIS — E785 Hyperlipidemia, unspecified: Secondary | ICD-10-CM | POA: Diagnosis not present

## 2016-01-20 DIAGNOSIS — E039 Hypothyroidism, unspecified: Secondary | ICD-10-CM | POA: Diagnosis present

## 2016-01-20 DIAGNOSIS — R52 Pain, unspecified: Secondary | ICD-10-CM | POA: Diagnosis not present

## 2016-01-20 DIAGNOSIS — R1011 Right upper quadrant pain: Secondary | ICD-10-CM | POA: Diagnosis not present

## 2016-01-20 DIAGNOSIS — R17 Unspecified jaundice: Secondary | ICD-10-CM | POA: Diagnosis present

## 2016-01-20 DIAGNOSIS — R112 Nausea with vomiting, unspecified: Secondary | ICD-10-CM | POA: Diagnosis not present

## 2016-01-20 DIAGNOSIS — K219 Gastro-esophageal reflux disease without esophagitis: Secondary | ICD-10-CM | POA: Diagnosis present

## 2016-01-20 DIAGNOSIS — D509 Iron deficiency anemia, unspecified: Secondary | ICD-10-CM | POA: Diagnosis not present

## 2016-01-20 DIAGNOSIS — M25562 Pain in left knee: Secondary | ICD-10-CM | POA: Diagnosis present

## 2016-01-20 DIAGNOSIS — R1111 Vomiting without nausea: Secondary | ICD-10-CM | POA: Diagnosis not present

## 2016-01-20 DIAGNOSIS — K8051 Calculus of bile duct without cholangitis or cholecystitis with obstruction: Secondary | ICD-10-CM | POA: Diagnosis not present

## 2016-01-20 DIAGNOSIS — Z853 Personal history of malignant neoplasm of breast: Secondary | ICD-10-CM | POA: Diagnosis not present

## 2016-01-20 DIAGNOSIS — R109 Unspecified abdominal pain: Secondary | ICD-10-CM | POA: Diagnosis present

## 2016-01-20 DIAGNOSIS — R1310 Dysphagia, unspecified: Secondary | ICD-10-CM | POA: Diagnosis not present

## 2016-01-20 DIAGNOSIS — R51 Headache: Secondary | ICD-10-CM | POA: Diagnosis not present

## 2016-01-20 DIAGNOSIS — Z48815 Encounter for surgical aftercare following surgery on the digestive system: Secondary | ICD-10-CM | POA: Diagnosis not present

## 2016-01-20 DIAGNOSIS — R262 Difficulty in walking, not elsewhere classified: Secondary | ICD-10-CM | POA: Diagnosis not present

## 2016-01-20 DIAGNOSIS — R278 Other lack of coordination: Secondary | ICD-10-CM | POA: Diagnosis not present

## 2016-01-20 DIAGNOSIS — R945 Abnormal results of liver function studies: Secondary | ICD-10-CM | POA: Diagnosis present

## 2016-01-20 LAB — CBC WITH DIFFERENTIAL/PLATELET
BASOS ABS: 0 10*3/uL (ref 0.0–0.1)
Basophils Relative: 0 %
EOS ABS: 0 10*3/uL (ref 0.0–0.7)
EOS PCT: 0 %
HCT: 31 % — ABNORMAL LOW (ref 36.0–46.0)
HEMOGLOBIN: 10.2 g/dL — AB (ref 12.0–15.0)
LYMPHS ABS: 0.1 10*3/uL — AB (ref 0.7–4.0)
LYMPHS PCT: 1 %
MCH: 28.5 pg (ref 26.0–34.0)
MCHC: 32.9 g/dL (ref 30.0–36.0)
MCV: 86.6 fL (ref 78.0–100.0)
Monocytes Absolute: 0.3 10*3/uL (ref 0.1–1.0)
Monocytes Relative: 4 %
NEUTROS PCT: 95 %
Neutro Abs: 8.6 10*3/uL — ABNORMAL HIGH (ref 1.7–7.7)
PLATELETS: 145 10*3/uL — AB (ref 150–400)
RBC: 3.58 MIL/uL — AB (ref 3.87–5.11)
RDW: 14.4 % (ref 11.5–15.5)
WBC: 9.1 10*3/uL (ref 4.0–10.5)

## 2016-01-20 LAB — COMPREHENSIVE METABOLIC PANEL
ALT: 208 U/L — AB (ref 14–54)
ANION GAP: 16 — AB (ref 5–15)
AST: 354 U/L — ABNORMAL HIGH (ref 15–41)
Albumin: 3.1 g/dL — ABNORMAL LOW (ref 3.5–5.0)
Alkaline Phosphatase: 153 U/L — ABNORMAL HIGH (ref 38–126)
BUN: 16 mg/dL (ref 6–20)
CHLORIDE: 98 mmol/L — AB (ref 101–111)
CO2: 21 mmol/L — AB (ref 22–32)
CREATININE: 1.11 mg/dL — AB (ref 0.44–1.00)
Calcium: 8.6 mg/dL — ABNORMAL LOW (ref 8.9–10.3)
GFR, EST AFRICAN AMERICAN: 48 mL/min — AB (ref >60)
GFR, EST NON AFRICAN AMERICAN: 42 mL/min — AB (ref >60)
Glucose, Bld: 140 mg/dL — ABNORMAL HIGH (ref 65–99)
Potassium: 3.2 mmol/L — ABNORMAL LOW (ref 3.5–5.1)
SODIUM: 135 mmol/L (ref 135–145)
Total Bilirubin: 4 mg/dL — ABNORMAL HIGH (ref 0.3–1.2)
Total Protein: 6.7 g/dL (ref 6.5–8.1)

## 2016-01-20 LAB — LIPASE, BLOOD: LIPASE: 17 U/L (ref 11–51)

## 2016-01-20 LAB — TROPONIN I

## 2016-01-20 MED ORDER — FENTANYL CITRATE (PF) 100 MCG/2ML IJ SOLN
25.0000 ug | Freq: Once | INTRAMUSCULAR | Status: AC
  Administered 2016-01-20: 25 ug via INTRAVENOUS
  Filled 2016-01-20: qty 2

## 2016-01-20 MED ORDER — ONDANSETRON HCL 4 MG PO TABS: 4.0000 mg | ORAL_TABLET | Freq: Four times a day (QID) | ORAL | Status: DC | PRN

## 2016-01-20 MED ORDER — POTASSIUM CHLORIDE ER 10 MEQ PO TBCR
10.0000 meq | EXTENDED_RELEASE_TABLET | Freq: Two times a day (BID) | ORAL | Status: DC
Start: 2016-01-20 — End: 2016-01-21
  Administered 2016-01-21: 10 meq via ORAL
  Filled 2016-01-20 (×3): qty 1

## 2016-01-20 MED ORDER — HEPARIN SODIUM (PORCINE) 5000 UNIT/ML IJ SOLN
5000.0000 [IU] | Freq: Two times a day (BID) | INTRAMUSCULAR | Status: DC
  Administered 2016-01-21 (×2): 5000 [IU] via SUBCUTANEOUS
  Filled 2016-01-20 (×2): qty 1

## 2016-01-20 MED ORDER — POTASSIUM CHLORIDE IN NACL 20-0.9 MEQ/L-% IV SOLN
INTRAVENOUS | Status: DC
  Administered 2016-01-21 – 2016-01-25 (×7): via INTRAVENOUS

## 2016-01-20 MED ORDER — ACETAMINOPHEN 325 MG PO TABS
650.0000 mg | ORAL_TABLET | Freq: Four times a day (QID) | ORAL | Status: DC | PRN
  Administered 2016-01-21 – 2016-01-22 (×2): 650 mg via ORAL
  Filled 2016-01-20 (×3): qty 2

## 2016-01-20 MED ORDER — ACETAMINOPHEN 650 MG RE SUPP: 650.0000 mg | Freq: Four times a day (QID) | RECTAL | Status: DC | PRN

## 2016-01-20 MED ORDER — IOPAMIDOL (ISOVUE-300) INJECTION 61%
100.0000 mL | Freq: Once | INTRAVENOUS | Status: AC | PRN
  Administered 2016-01-20: 100 mL via INTRAVENOUS

## 2016-01-20 MED ORDER — SODIUM CHLORIDE 0.9 % IV BOLUS (SEPSIS)
1000.0000 mL | Freq: Once | INTRAVENOUS | Status: AC
  Administered 2016-01-20: 1000 mL via INTRAVENOUS

## 2016-01-20 MED ORDER — PANTOPRAZOLE SODIUM 40 MG PO TBEC
40.0000 mg | DELAYED_RELEASE_TABLET | Freq: Every day | ORAL | Status: DC
Start: 2016-01-21 — End: 2016-01-27
  Administered 2016-01-21 – 2016-01-27 (×7): 40 mg via ORAL
  Filled 2016-01-20 (×7): qty 1

## 2016-01-20 MED ORDER — ONDANSETRON HCL 4 MG/2ML IJ SOLN
4.0000 mg | Freq: Once | INTRAMUSCULAR | Status: AC
  Administered 2016-01-20: 4 mg via INTRAVENOUS
  Filled 2016-01-20: qty 2

## 2016-01-20 MED ORDER — LEVOTHYROXINE SODIUM 25 MCG PO TABS
125.0000 ug | ORAL_TABLET | Freq: Every day | ORAL | Status: DC
  Administered 2016-01-21 – 2016-01-27 (×7): 125 ug via ORAL
  Filled 2016-01-20 (×7): qty 1

## 2016-01-20 MED ORDER — PIPERACILLIN-TAZOBACTAM 3.375 G IVPB 30 MIN
3.3750 g | Freq: Once | INTRAVENOUS | Status: AC
  Administered 2016-01-20: 3.375 g via INTRAVENOUS
  Filled 2016-01-20: qty 50

## 2016-01-20 MED ORDER — DOCUSATE SODIUM 100 MG PO CAPS
100.0000 mg | ORAL_CAPSULE | Freq: Every morning | ORAL | Status: DC
  Administered 2016-01-21 – 2016-01-27 (×7): 100 mg via ORAL
  Filled 2016-01-20 (×7): qty 1

## 2016-01-20 MED ORDER — ONDANSETRON HCL 4 MG/2ML IJ SOLN
4.0000 mg | Freq: Four times a day (QID) | INTRAMUSCULAR | Status: DC | PRN
  Filled 2016-01-20: qty 2

## 2016-01-20 NOTE — ED Notes (Signed)
Pt back from [place] and is now being taken to [place].

## 2016-01-20 NOTE — ED Notes (Signed)
Attempted IV twice unsuccessfully.  

## 2016-01-20 NOTE — ED Provider Notes (Signed)
CSN: [id number]     Arrival date & time 01/20/16  1517 History   First MD Initiated Contact with Patient 01/20/16 1527     Chief Complaint  Patient presents with  . Emesis     (Consider location/radiation/quality/duration/timing/severity/associated sxs/prior Treatment) HPI  82 year old female presents with a chief complaint of vomiting. She has been vomiting since yesterday per patient and family. Patient has also developed some diarrhea this morning. No blood in either. Patient is also complaining of abdominal pain and points to her right abdomen is the worst part of pain. Is also been complaining of low back pain. No known fevers. No chest pain or shortness of breath. However today the patient has been much weaker and it is to effort her to get out of bed. Family member at the bedside states she was having a lot of nausea yesterday and thinks it may have been infected with the same thing. No urinary symptoms.  Past Medical History  Diagnosis Date  . Arthritis   . Thyroid disease   . Cancer [facility])     had breast cancer 8 yrs ago  . Hypertension   . Hypomagnesemia   . GERD (gastroesophageal reflux disease)   . Pancreatitis   . Osteoarthritis   . Hypokalemia   . Pancreatitis, acute 05/03/2015  . 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 Anemia   . Bradycardia   . HCAP (healthcare-associated pneumonia)    Past Surgical History  Procedure Laterality Date  . Breast surgery    . Esophagogastroduodenoscopy N/A 05/07/2015    Procedure: ESOPHAGOGASTRODUODENOSCOPY (EGD);  Surgeon: Rogene Houston, MD;  Location: [facility];  Service: Endoscopy;  Laterality: N/A;   No family history on file. Social History  Substance Use Topics  . Smoking status: Never Smoker   . Smokeless tobacco: None  . Alcohol Use: No   OB History    No data available     Review of Systems  Constitutional: Negative for fever.  Respiratory: Negative for shortness of breath.    Cardiovascular: Negative for chest pain.  Gastrointestinal: Positive for nausea, vomiting, abdominal pain and diarrhea.  Genitourinary: Negative for dysuria.  Musculoskeletal: Positive for back pain.  Neurological: Positive for weakness.  All other systems reviewed and are negative.     Allergies  Codeine and Nsaids  Home Medications   Prior to Admission medications   Medication Sig Start Date End Date Taking? Authorizing Provider  albuterol (PROVENTIL) (2.5 MG/3ML) 0.083% nebulizer solution Take 2.5 mg by nebulization every 6 (six) hours as needed for wheezing or shortness of breath.    Historical Provider, MD  Amino Acids-Protein Hydrolys (FEEDING SUPPLEMENT, PRO-STAT SUGAR FREE 64,) LIQD Take 30 mLs by mouth 2 (two) times daily.    Historical Provider, MD  amoxicillin (AMOXIL) 250 MG capsule Take 1 capsule (250 mg total) by mouth 2 (two) times daily. 05/22/15   Radene Gunning, NP  docusate sodium (COLACE) 100 MG capsule Take 100 mg by mouth every morning.    Historical Provider, MD  ferrous sulfate 325 (65 FE) MG tablet Take 1 tablet (325 mg total) by mouth daily with breakfast. 05/22/15   Radene Gunning, NP  fluticasone (FLONASE) 50 MCG/ACT nasal spray Place 1 spray into the nose 2 (two) times daily.    Historical Provider, MD  guaiFENesin (MUCINEX) 600 MG 12 hr tablet Take 600 mg by mouth 2 (two) times daily.    Historical Provider, MD  levothyroxine (  SYNTHROID, LEVOTHROID) 150 MCG tablet Take 1 tablet (150 mcg total) by mouth daily before breakfast. 05/22/15   Radene Gunning, NP  loratadine (CLARITIN) 10 MG tablet Take 10 mg by mouth daily.    Historical Provider, MD  losartan (COZAAR) 100 MG tablet Take 100 mg by mouth daily.    Historical Provider, MD  magnesium oxide (MAG-OX) 400 MG tablet Take 400 mg by mouth daily.    Historical Provider, MD  Olopatadine HCl (PATADAY) 0.2 % SOLN Place 1 drop into both eyes daily.    Historical Provider, MD  omeprazole (PRILOSEC) 20 MG capsule Take  20 mg by mouth daily.    Historical Provider, MD  potassium chloride (K-DUR) 10 MEQ tablet Take 10 mEq by mouth 2 (two) times daily.    Historical Provider, MD  simvastatin (ZOCOR) 10 MG tablet Take 10 mg by mouth at bedtime.    Historical Provider, MD  traMADol Veatrice Bourbon) 50 MG tablet Take one tablet by mouth every 6 hours as needed for pain 06/23/15   Gildardo Cranker, DO  Vitamin D, Ergocalciferol, (DRISDOL) 50000 UNITS CAPS Take 50,000 Units by mouth every Tuesday.     Historical Provider, MD   BP 118/60 mmHg  Pulse 106  Temp(Src) 98 F (36.7 C) (Oral)  Resp 17  Ht 5\' 7"  (1.702 m)  Wt 135 lb (61.236 kg)  BMI 21.14 kg/m2  SpO2 94% Physical Exam  Constitutional: She is oriented to person, place, and time. She appears well-developed and well-nourished.  HENT:  Head: Normocephalic and atraumatic.  Right Ear: External ear normal.  Left Ear: External ear normal.  Nose: Nose normal.  Mouth/Throat: Oropharynx is clear and moist. No oropharyngeal exudate.  Eyes: Right eye exhibits no discharge. Left eye exhibits no discharge.  Cardiovascular: Normal rate, regular rhythm and normal heart sounds.   Pulmonary/Chest: Effort normal and breath sounds normal.  Abdominal: Soft. She exhibits no distension. There is tenderness (diffuse, worst in RUQ, RLQ).  Neurological: She is alert and oriented to person, place, and time.  Skin: Skin is warm and dry.  Nursing note and vitals reviewed.   ED Course  Procedures (including critical care time) Labs Review Labs Reviewed  COMPREHENSIVE METABOLIC PANEL - Abnormal; Notable for the following:    Potassium 3.2 (*)    Chloride 98 (*)    CO2 21 (*)    Glucose, Bld 140 (*)    Creatinine, Ser 1.11 (*)    Calcium 8.6 (*)    Albumin 3.1 (*)    AST 354 (*)    ALT 208 (*)    Alkaline Phosphatase 153 (*)    Total Bilirubin 4.0 (*)    GFR calc non Af Amer 42 (*)    GFR calc Af Amer 48 (*)    Anion gap 16 (*)    All other components within normal limits   CBC WITH DIFFERENTIAL/PLATELET - Abnormal; Notable for the following:    RBC 3.58 (*)    Hemoglobin 10.2 (*)    HCT 31.0 (*)    Platelets 145 (*)    Neutro Abs 8.6 (*)    Lymphs Abs 0.1 (*)    All other components within normal limits  COMPREHENSIVE METABOLIC PANEL - Abnormal; Notable for the following:    BUN 24 (*)    Creatinine, Ser 1.93 (*)    Calcium 7.9 (*)    Total Protein 6.2 (*)    Albumin 2.8 (*)    AST 244 (*)  ALT 193 (*)    Total Bilirubin 1.8 (*)    GFR calc non Af Amer 21 (*)    GFR calc Af Amer 25 (*)    All other components within normal limits  CBC - Abnormal; Notable for the following:    WBC 24.4 (*)    RBC 3.21 (*)    Hemoglobin 9.1 (*)    HCT 27.4 (*)    Platelets 136 (*)    All other components within normal limits  LIPASE, BLOOD  TROPONIN I  URINALYSIS, ROUTINE W REFLEX MICROSCOPIC (NOT AT [facility])  CBC    Imaging Review Ct Abdomen Pelvis W Contrast  01/20/2016  CLINICAL DATA:  Nausea and vomiting for 1 day EXAM: CT ABDOMEN AND PELVIS WITH CONTRAST TECHNIQUE: Multidetector CT imaging of the abdomen and pelvis was performed using the standard protocol following bolus administration of intravenous contrast. CONTRAST:  165mL ISOVUE-300 IOPAMIDOL (ISOVUE-300) INJECTION 61% COMPARISON:  01/20/2016, 05/06/2015 FINDINGS: Lung bases demonstrate some dependent atelectatic changes without focal confluent infiltrate. The liver demonstrates a somewhat mottled enhancement pattern in the midportion of the right lobe of the liver likely related to the underlying biliary ductal dilatation and some change in the degree of per fusion. Gallbladder is well distended. The changes seen on recent ultrasound are less well appreciated on the current exam. Common bile duct is dilated to 17 mm. This extends inferiorly to the level of the ampulla. A a somewhat rounded filling defect is noted. This may represent a poorly calcified stone although the possibility of underlying mass  lesion would deserve consideration as well. No significant pancreatic ductal dilatation is seen. Spleen, adrenal glands, pancreas and kidneys are otherwise within normal limits. The bladder is well distended. The uterus again show some calcific changes consistent with uterine fibroids. Central decreased attenuation is noted which may also be related to underlying fibroid disease. No pelvic mass lesion or sidewall adenopathy is noted. Mild aortoiliac calcifications are seen without aneurysmal dilatation. The osseous structures show degenerative change of the lumbar spine. No acute compression deformity is noted. IMPRESSION: Significant biliary ductal dilatation with dilatation of the gallbladder. A rounded soft tissue density is noted distally best seen on the coronal imaging. A neoplasm would be difficult to exclude. ERCP is recommended for further evaluation as well as drainage. Changes in the degree of perfusion of the liver likely related to the biliary ductal dilatation. There are changes suggestive of some thrombosis of some of the right portal venous branches best seen on image number 14 of series 2. Chronic changes as described. Electronically Signed   By: Inez Catalina M.D.   On: 01/20/2016 18:29   US Abdomen Limited Ruq  01/20/2016  CLINICAL DATA:  Nausea and vomiting since this morning. EXAM: US ABDOMEN LIMITED - RIGHT UPPER QUADRANT COMPARISON:  CT 05/03/2015 and MRI 05/06/2015 FINDINGS: Gallbladder: Hydropic gallbladder measuring 11.2 cm in length. Moderate gallbladder sludge with several small stones the largest measuring 3 mm. There is wall thickening measuring up to 9 mm with pericholecystic fluid. Positive sonographic Murphy's sign. Common bile duct: Diameter: 18 mm without evidence of choledocholithiasis. Liver: Center intrahepatic duct dilatation. IMPRESSION: Dilated gallbladder with constellation of findings which may be due to acute cholecystitis. Moderate dilatation of the common bile duct  without evidence of ductal stones. Intrahepatic ductal dilatation. Further evaluation with cross-sectional imaging may be helpful to exclude a more distal obstructing process at the level of the head of the pancreas/ampulla. Electronically Signed   By: Quillian Quince  Derrel Nip M.D.   On: 01/20/2016 18:01   I have personally reviewed and evaluated these images and lab results as part of my medical decision-making.   EKG Interpretation   Date/Time:  Tuesday Jan 20 2016 15:25:04 EDT Ventricular Rate:  105 PR Interval:  166 QRS Duration: 120 QT Interval:  325 QTC Calculation: 429 R Axis:   -10 Text Interpretation:  Sinus tachycardia Consider right atrial enlargement  Incomplete left bundle branch block LVH with secondary repolarization  abnormality changes noted since Sept 2016 Confirmed by Regenia Skeeter MD, Jaelle Campanile  [phone]) on 01/20/2016 3:28:24 PM      MDM   Final diagnoses:  RUQ abdominal pain    No vomiting in ED. Pain improved with small dose of fentanyl. U/s obtained given LFT abnormalities and shows likely cholecystitis and dilated CBD. Discussed with Dr. Arnoldo Morale who recommends hospitalist admission and GI consult for MRCP/ERCP and then he may need to take out gallbladder. BP is stable. Will give zosyn. CT originally ordered but I was unable to cancel it after I found out U/S results. Admit to hospitalist.    Sherwood Gambler, MD 01/21/16 [phone]

## 2016-01-20 NOTE — ED Notes (Signed)
Jo Hardin, pts daughter, left pt bedside and reported wanted to leave contact information in cae of updates. Pt daughter contact information: [phone]

## 2016-01-20 NOTE — ED Notes (Signed)
Attempted to obtain IV access x2 unsuccessfully. Charge RN aware and reported would attempt access.

## 2016-01-20 NOTE — ED Notes (Signed)
Pt here for evaluation of nausea and vomiting that started today.

## 2016-01-20 NOTE — H&P (Signed)
[facility] History and Physical  Jo Hardin ZCH:[id number] DOB: 1921-09-07 DOA: 01/20/2016  Referring physician: Dr. Regenia Skeeter PCP: [name], MD   Chief Complaint: Abd pain  HPI: Jo Hardin is a 82 y.o. female w nausea and vomiting that started today, also abd pain.  Brought to ED by daughter and CT abd showing biliary tree dilatation and LFT"s are up with AST 354, ALT 208 and tbili 4.0.  No fevers or chills.  Asked to see pt for admission.    Patient doesn't provide any history.  She is eldelry and daughter states that she uses a walker at home.  Hx of breast cancer w R mastectomy about 12 years ago.  Takes medicine for HTN, GERD, HL and hypothyroid.  No allergies and no etoh/ tobacco use.     Only admission in [facility] is Sept 2016 with HCAP, sinusitis, acute AMS due to TME, AKI, bradycardia, hypotension.  BP meds were held, TSH was up and synthroid dose was ^'d.      Where does patient live with family Can patient participate in ADLs? minimal  Past Medical History  Past Medical History  Diagnosis Date  . Arthritis   . Thyroid disease   . Cancer [facility])     had breast cancer 8 yrs ago  . Hypertension   . Hypomagnesemia   . GERD (gastroesophageal reflux disease)   . Pancreatitis   . Osteoarthritis   . Hypokalemia   . Pancreatitis, acute 05/03/2015  . 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 Anemia   . Bradycardia   . HCAP (healthcare-associated pneumonia)    Past Surgical History  Past Surgical History  Procedure Laterality Date  . Breast surgery    . Esophagogastroduodenoscopy N/A 05/07/2015    Procedure: ESOPHAGOGASTRODUODENOSCOPY (EGD);  Surgeon: Rogene Houston, MD;  Location: [facility];  Service: Endoscopy;  Laterality: N/A;   Family History No family history on file. Social History  reports that she has never smoked. She does not have any smokeless tobacco history on file. She reports that she does not drink alcohol or use illicit  drugs. Allergies  Allergies  Allergen Reactions  . Codeine Nausea And Vomiting  . Nsaids    Home medications Prior to Admission medications   Medication Sig Start Date End Date Taking? Authorizing Provider  albuterol (PROVENTIL) (2.5 MG/3ML) 0.083% nebulizer solution Take 2.5 mg by nebulization every 6 (six) hours as needed for wheezing or shortness of breath.   Yes Historical Provider, MD  amLODipine (NORVASC) 5 MG tablet Take 5 mg by mouth every morning.   Yes Historical Provider, MD  docusate sodium (COLACE) 100 MG capsule Take 100 mg by mouth every morning.   Yes Historical Provider, MD  levothyroxine (SYNTHROID, LEVOTHROID) 125 MCG tablet Take 125 mcg by mouth daily before breakfast.   Yes Historical Provider, MD  losartan (COZAAR) 100 MG tablet Take 100 mg by mouth daily.   Yes Historical Provider, MD  omeprazole (PRILOSEC) 20 MG capsule Take 20 mg by mouth daily.   Yes Historical Provider, MD  potassium chloride (K-DUR) 10 MEQ tablet Take 10 mEq by mouth 2 (two) times daily.   Yes Historical Provider, MD  simvastatin (ZOCOR) 10 MG tablet Take 10 mg by mouth at bedtime.   Yes Historical Provider, MD  traMADol (ULTRAM) 50 MG tablet Take one tablet by mouth every 6 hours as needed for pain Patient taking differently: Take 50-100 mg by mouth 2 (two) times daily.  22m in the morning and 1050mat bedtime. *May take one tablet every 6 hours as needed for pain 06/23/15  Yes MoGildardo CrankerDO  Vitamin D, Ergocalciferol, (DRISDOL) 50000 UNITS CAPS Take 50,000 Units by mouth every Tuesday.    Yes Historical Provider, MD   Liver Function Tests  Recent Labs Lab 01/20/16 1603  AST 354*  ALT 208*  ALKPHOS 153*  BILITOT 4.0*  PROT 6.7  ALBUMIN 3.1*    Recent Labs Lab 01/20/16 1603  LIPASE 17   CBC  Recent Labs Lab 01/20/16 1603  WBC 9.1  NEUTROABS 8.6*  HGB 10.2*  HCT 31.0*  MCV 86.6  PLT 14168  Basic Metabolic Panel  Recent Labs Lab 01/20/16 1603  NA 135  K 3.2*  CL  98*  CO2 21*  GLUCOSE 140*  BUN 16  CREATININE 1.11*  CALCIUM 8.6*     Filed Vitals:   01/20/16 1720 01/20/16 1730 01/20/16 1854 01/20/16 1930  BP: 112/75 110/85 100/55 99/62  Pulse:  79  98  Temp:      TempSrc:      Resp: 21 26 25 22   Height:      Weight:      SpO2:  82%  90%   Exam: Gen elderly very frail woman, not in distress, not speaking much at all, awake No rash, cyanosis or gangrene Sclera anicteric, throat clear and moist  No jvd or bruits Chest dec'd at bases, no rales or wheezing RRR no MRG Abd soft ntnd no mass or ascites +bs benign abdomen GU defer MS no joint effusions or deformity Ext trace ankle edema bilat / no wounds or ulcers Neuro is alert, gen'd weakness   EKG (independently reviewed) > sinus tach 103, incomp LBBB, LVH w repol chgs  Home medications:  Norvasc, cozaar, synthroid, prilosec, Kdur, Zocor, Ultram, albuterol  Na 135  K 3.2  BUN 16  Creat 1.11  eGFR 42   Ca 8.6  Alb 3.1  AST 354  ALT 208  Tbili 4.0   WBC 9k  Hb 10.2  plt 145   Assessment: 1. Abd pain / jaundice - dilated biliary tree on CT.  No signs of sepsis (afeb, WBC nl).  Will plan admit, give IVF's, GI consult in am.   2. HTN - BP's soft, vol depleted, hold BP meds 3. Hypovolemia - IVF 4. DNR - daughters request  Plan - as above    [facility] Pager [phone] ([phone] 7PM-7AM, please contact night-coverage [email or web address] Password [email or web address] 01/20/2016, 9:30 PM

## 2016-01-20 NOTE — ED Notes (Signed)
MD at bedside. 

## 2016-01-21 DIAGNOSIS — R52 Pain, unspecified: Secondary | ICD-10-CM

## 2016-01-21 DIAGNOSIS — K838 Other specified diseases of biliary tract: Secondary | ICD-10-CM

## 2016-01-21 DIAGNOSIS — R1011 Right upper quadrant pain: Secondary | ICD-10-CM

## 2016-01-21 DIAGNOSIS — R932 Abnormal findings on diagnostic imaging of liver and biliary tract: Secondary | ICD-10-CM

## 2016-01-21 LAB — COMPREHENSIVE METABOLIC PANEL
ALBUMIN: 2.8 g/dL — AB (ref 3.5–5.0)
ALK PHOS: 98 U/L (ref 38–126)
ALT: 193 U/L — ABNORMAL HIGH (ref 14–54)
ANION GAP: 11 (ref 5–15)
AST: 244 U/L — ABNORMAL HIGH (ref 15–41)
BILIRUBIN TOTAL: 1.8 mg/dL — AB (ref 0.3–1.2)
BUN: 24 mg/dL — AB (ref 6–20)
CALCIUM: 7.9 mg/dL — AB (ref 8.9–10.3)
CO2: 23 mmol/L (ref 22–32)
Chloride: 102 mmol/L (ref 101–111)
Creatinine, Ser: 1.93 mg/dL — ABNORMAL HIGH (ref 0.44–1.00)
GFR calc Af Amer: 25 mL/min — ABNORMAL LOW (ref >60)
GFR, EST NON AFRICAN AMERICAN: 21 mL/min — AB (ref >60)
GLUCOSE: 83 mg/dL (ref 65–99)
POTASSIUM: 3.6 mmol/L (ref 3.5–5.1)
Sodium: 136 mmol/L (ref 135–145)
TOTAL PROTEIN: 6.2 g/dL — AB (ref 6.5–8.1)

## 2016-01-21 LAB — CBC
HCT: 27.4 % — ABNORMAL LOW (ref 36.0–46.0)
Hemoglobin: 9.1 g/dL — ABNORMAL LOW (ref 12.0–15.0)
MCH: 28.3 pg (ref 26.0–34.0)
MCHC: 33.2 g/dL (ref 30.0–36.0)
MCV: 85.4 fL (ref 78.0–100.0)
PLATELETS: 136 10*3/uL — AB (ref 150–400)
RBC: 3.21 MIL/uL — AB (ref 3.87–5.11)
RDW: 15.1 % (ref 11.5–15.5)
WBC: 24.4 10*3/uL — AB (ref 4.0–10.5)

## 2016-01-21 MED ORDER — POTASSIUM CHLORIDE CRYS ER 10 MEQ PO TBCR
10.0000 meq | EXTENDED_RELEASE_TABLET | Freq: Two times a day (BID) | ORAL | Status: DC
  Administered 2016-01-21 – 2016-01-27 (×12): 10 meq via ORAL
  Filled 2016-01-21 (×13): qty 1

## 2016-01-21 MED ORDER — CIPROFLOXACIN IN D5W 400 MG/200ML IV SOLN
400.0000 mg | INTRAVENOUS | Status: DC
  Administered 2016-01-21 – 2016-01-22 (×2): 400 mg via INTRAVENOUS
  Filled 2016-01-21 (×3): qty 200

## 2016-01-21 MED ORDER — METRONIDAZOLE IN NACL 5-0.79 MG/ML-% IV SOLN
500.0000 mg | Freq: Two times a day (BID) | INTRAVENOUS | Status: DC
  Administered 2016-01-21 – 2016-01-24 (×4): 500 mg via INTRAVENOUS
  Filled 2016-01-21 (×4): qty 100

## 2016-01-21 NOTE — Progress Notes (Signed)
Subjective: Patient was admitted yesterday due to abdominal pain. She was found to have obstructive jaundice. Patient feels better this morning. No nausea or vomiting  Objective: Vital signs in last 24 hours: Temp:  [98 F (36.7 C)-98.1 F (36.7 C)] 98.1 F (36.7 C) (05/24 0616) Pulse Rate:  [79-106] 91 (05/24 0616) Resp:  [6-26] 16 (05/24 0616) BP: (84-118)/(46-85) 84/46 mmHg (05/24 0616) SpO2:  [82 %-95 %] 95 % (05/24 0616) Weight:  [61.236 kg (135 lb)] 61.236 kg (135 lb) (05/23 1520) Weight change:     Intake/Output from previous day:    PHYSICAL EXAM General appearance: alert and slowed mentation Resp: clear to auscultation bilaterally Cardio: S1, S2 normal GI: soft and lax, mild RUO tenderness Extremities: extremities normal, atraumatic, no cyanosis or edema  Lab Results:  Results for orders placed or performed during the hospital encounter of 01/20/16 (from the past 48 hour(s))  Comprehensive metabolic panel     Status: Abnormal   Collection Time: 01/20/16  4:03 PM  Result Value Ref Range   Sodium 135 135 - 145 mmol/L   Potassium 3.2 (L) 3.5 - 5.1 mmol/L   Chloride 98 (L) 101 - 111 mmol/L   CO2 21 (L) 22 - 32 mmol/L   Glucose, Bld 140 (H) 65 - 99 mg/dL   BUN 16 6 - 20 mg/dL   Creatinine, Ser 1.11 (H) 0.44 - 1.00 mg/dL   Calcium 8.6 (L) 8.9 - 10.3 mg/dL   Total Protein 6.7 6.5 - 8.1 g/dL   Albumin 3.1 (L) 3.5 - 5.0 g/dL   AST 354 (H) 15 - 41 U/L   ALT 208 (H) 14 - 54 U/L   Alkaline Phosphatase 153 (H) 38 - 126 U/L   Total Bilirubin 4.0 (H) 0.3 - 1.2 mg/dL   GFR calc non Af Amer 42 (L) >60 mL/min   GFR calc Af Amer 48 (L) >60 mL/min    Comment: (NOTE) The eGFR has been calculated using the CKD EPI equation. This calculation has not been validated in all clinical situations. eGFR's persistently <60 mL/min signify possible Chronic Kidney Disease.    Anion gap 16 (H) 5 - 15  Lipase, blood     Status: None   Collection Time: 01/20/16  4:03 PM  Result Value  Ref Range   Lipase 17 11 - 51 U/L  CBC with Differential     Status: Abnormal   Collection Time: 01/20/16  4:03 PM  Result Value Ref Range   WBC 9.1 4.0 - 10.5 K/uL   RBC 3.58 (L) 3.87 - 5.11 MIL/uL   Hemoglobin 10.2 (L) 12.0 - 15.0 g/dL   HCT 31.0 (L) 36.0 - 46.0 %   MCV 86.6 78.0 - 100.0 fL   MCH 28.5 26.0 - 34.0 pg   MCHC 32.9 30.0 - 36.0 g/dL   RDW 14.4 11.5 - 15.5 %   Platelets 145 (L) 150 - 400 K/uL   Neutrophils Relative % 95 %   Neutro Abs 8.6 (H) 1.7 - 7.7 K/uL   Lymphocytes Relative 1 %   Lymphs Abs 0.1 (L) 0.7 - 4.0 K/uL   Monocytes Relative 4 %   Monocytes Absolute 0.3 0.1 - 1.0 K/uL   Eosinophils Relative 0 %   Eosinophils Absolute 0.0 0.0 - 0.7 K/uL   Basophils Relative 0 %   Basophils Absolute 0.0 0.0 - 0.1 K/uL  Troponin I     Status: None   Collection Time: 01/20/16  4:03 PM  Result Value Ref Range  Troponin I <0.03 <0.031 ng/mL    Comment:        NO INDICATION OF MYOCARDIAL INJURY.   Comprehensive metabolic panel     Status: Abnormal   Collection Time: 01/21/16  5:21 AM  Result Value Ref Range   Sodium 136 135 - 145 mmol/L   Potassium 3.6 3.5 - 5.1 mmol/L   Chloride 102 101 - 111 mmol/L   CO2 23 22 - 32 mmol/L   Glucose, Bld 83 65 - 99 mg/dL   BUN 24 (H) 6 - 20 mg/dL   Creatinine, Ser 1.93 (H) 0.44 - 1.00 mg/dL   Calcium 7.9 (L) 8.9 - 10.3 mg/dL   Total Protein 6.2 (L) 6.5 - 8.1 g/dL   Albumin 2.8 (L) 3.5 - 5.0 g/dL   AST 244 (H) 15 - 41 U/L   ALT 193 (H) 14 - 54 U/L   Alkaline Phosphatase 98 38 - 126 U/L   Total Bilirubin 1.8 (H) 0.3 - 1.2 mg/dL   GFR calc non Af Amer 21 (L) >60 mL/min   GFR calc Af Amer 25 (L) >60 mL/min    Comment: (NOTE) The eGFR has been calculated using the CKD EPI equation. This calculation has not been validated in all clinical situations. eGFR's persistently <60 mL/min signify possible Chronic Kidney Disease.    Anion gap 11 5 - 15  CBC     Status: Abnormal   Collection Time: 01/21/16  5:21 AM  Result Value Ref  Range   WBC 24.4 (H) 4.0 - 10.5 K/uL   RBC 3.21 (L) 3.87 - 5.11 MIL/uL   Hemoglobin 9.1 (L) 12.0 - 15.0 g/dL   HCT 27.4 (L) 36.0 - 46.0 %   MCV 85.4 78.0 - 100.0 fL   MCH 28.3 26.0 - 34.0 pg   MCHC 33.2 30.0 - 36.0 g/dL   RDW 15.1 11.5 - 15.5 %   Platelets 136 (L) 150 - 400 K/uL    ABGS No results for input(s): PHART, PO2ART, TCO2, HCO3 in the last 72 hours.  Invalid input(s): PCO2 CULTURES No results found for this or any previous visit (from the past 240 hour(s)). Studies/Results: Ct Abdomen Pelvis W Contrast  01/20/2016  CLINICAL DATA:  Nausea and vomiting for 1 day EXAM: CT ABDOMEN AND PELVIS WITH CONTRAST TECHNIQUE: Multidetector CT imaging of the abdomen and pelvis was performed using the standard protocol following bolus administration of intravenous contrast. CONTRAST:  188m ISOVUE-300 IOPAMIDOL (ISOVUE-300) INJECTION 61% COMPARISON:  01/20/2016, 05/06/2015 FINDINGS: Lung bases demonstrate some dependent atelectatic changes without focal confluent infiltrate. The liver demonstrates a somewhat mottled enhancement pattern in the midportion of the right lobe of the liver likely related to the underlying biliary ductal dilatation and some change in the degree of per fusion. Gallbladder is well distended. The changes seen on recent ultrasound are less well appreciated on the current exam. Common bile duct is dilated to 17 mm. This extends inferiorly to the level of the ampulla. A a somewhat rounded filling defect is noted. This may represent a poorly calcified stone although the possibility of underlying mass lesion would deserve consideration as well. No significant pancreatic ductal dilatation is seen. Spleen, adrenal glands, pancreas and kidneys are otherwise within normal limits. The bladder is well distended. The uterus again show some calcific changes consistent with uterine fibroids. Central decreased attenuation is noted which may also be related to underlying fibroid disease. No  pelvic mass lesion or sidewall adenopathy is noted. Mild aortoiliac calcifications are seen without  aneurysmal dilatation. The osseous structures show degenerative change of the lumbar spine. No acute compression deformity is noted. IMPRESSION: Significant biliary ductal dilatation with dilatation of the gallbladder. A rounded soft tissue density is noted distally best seen on the coronal imaging. A neoplasm would be difficult to exclude. ERCP is recommended for further evaluation as well as drainage. Changes in the degree of perfusion of the liver likely related to the biliary ductal dilatation. There are changes suggestive of some thrombosis of some of the right portal venous branches best seen on image number 14 of series 2. Chronic changes as described. Electronically Signed   By: Inez Catalina M.D.   On: 01/20/2016 18:29   US Abdomen Limited Ruq  01/20/2016  CLINICAL DATA:  Nausea and vomiting since this morning. EXAM: US ABDOMEN LIMITED - RIGHT UPPER QUADRANT COMPARISON:  CT 05/03/2015 and MRI 05/06/2015 FINDINGS: Gallbladder: Hydropic gallbladder measuring 11.2 cm in length. Moderate gallbladder sludge with several small stones the largest measuring 3 mm. There is wall thickening measuring up to 9 mm with pericholecystic fluid. Positive sonographic Murphy's sign. Common bile duct: Diameter: 18 mm without evidence of choledocholithiasis. Liver: Center intrahepatic duct dilatation. IMPRESSION: Dilated gallbladder with constellation of findings which may be due to acute cholecystitis. Moderate dilatation of the common bile duct without evidence of ductal stones. Intrahepatic ductal dilatation. Further evaluation with cross-sectional imaging may be helpful to exclude a more distal obstructing process at the level of the head of the pancreas/ampulla. Electronically Signed   By: Marin Olp M.D.   On: 01/20/2016 18:01    Medications: I have reviewed the patient's current medications.  Assesment:    Principal Problem:   Biliary obstruction Active Problems:   Essential hypertension   Hypothyroidism   Hypotension   Jaundice   Abnormal LFTs   Hypovolemia   Abdominal pain   Nausea & vomiting   DNR (do not resuscitate)    Plan:  Medications reviewed Will keep NPO Will do GI consult  Will monitor CMP and CBC.    LOS: 1 day   Lisaann Atha 01/21/2016, 8:31 AM

## 2016-01-21 NOTE — Consult Note (Signed)
Reason for Consult: dilated CBD Referring Physician: Hospitialist.  Jo Hardin  is an 82 y.o. female.  HPI: Admitted thru the ED yesterday with N, V, and rt upper quadrant pain. Symtoms started 2 days ago. Underwent a CT abdomen/pelvis which revealed a significant CBD dilatation.Admitted in September of 2016 with essential same symptoms Her appetite has been good for the most part. She has not lost any weight. She usually has a BM every 2-3 days and sometimes every other day. No melena or BRRB. He stools are usually loose.     05/07/2015 EGD  Indications: Patient is 82 year old African-American female was admitted with acute pancreatitis and she also has iron deficiency anemia and heme positive stool. Pancreatitis was felt to be biliary in origin. Imaging studies are negative for choledocholithiasis she is improving. Patient has been on meloxicam and therefore could have peptic ulcer disease. She is undergoing diagnostic EGD. Shows serum potassium was 2.7 earlier today and she has ceased by mouth potassium 40 mEq 2.  Impression: No evidence of peptic ulcer disease. 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 Normal ampulla of Vater.    Hepatic Function Latest Ref Rng 01/21/2016 01/20/2016 06/02/2015  Total Protein 6.5 - 8.1 g/dL 6.2(L) 6.7 6.4(L)  Albumin 3.5 - 5.0 g/dL 2.8(L) 3.1(L) 2.6(L)  AST 15 - 41 U/L 244(H) 354(H) 23  ALT 14 - 54 U/L 193(H) 208(H) 18  Alk Phosphatase 38 - 126 U/L 98 153(H) 43  Total Bilirubin 0.3 - 1.2 mg/dL 1.8(H) 4.0(H) 0.4   CBC    Component Value Date/Time   WBC 24.4* 01/21/2016 0521   RBC 3.21* 01/21/2016 0521   RBC 3.53* 05/04/2015 1643   HGB 9.1* 01/21/2016 0521   HCT 27.4* 01/21/2016 0521   PLT 136* 01/21/2016 0521   MCV 85.4 01/21/2016 0521   MCH 28.3 01/21/2016 0521   MCHC 33.2 01/21/2016 0521   RDW 15.1 01/21/2016 0521   LYMPHSABS 0.1* 01/20/2016 1603   MONOABS 0.3 01/20/2016 1603   EOSABS 0.0 01/20/2016 1603    BASOSABS 0.0 01/20/2016 1603        Past Medical History  Diagnosis Date  . Arthritis   . Thyroid disease   . Cancer [facility])     had breast cancer 8 yrs ago  . Hypertension   . Hypomagnesemia   . GERD (gastroesophageal reflux disease)   . Pancreatitis   . Osteoarthritis   . Hypokalemia   . Pancreatitis, acute 05/03/2015  . 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 Anemia   . Bradycardia   . HCAP (healthcare-associated pneumonia)     Past Surgical History  Procedure Laterality Date  . Breast surgery    . Esophagogastroduodenoscopy N/A 05/07/2015    Procedure: ESOPHAGOGASTRODUODENOSCOPY (EGD);  Surgeon: Rogene Houston, MD;  Location: [facility];  Service: Endoscopy;  Laterality: N/A;    History reviewed. No pertinent family history.  Social History:  reports that she has never smoked. She does not have any smokeless tobacco history on file. She reports that she does not drink alcohol or use illicit drugs.  Allergies:  Allergies  Allergen Reactions  . Codeine Nausea And Vomiting  . Nsaids     Medications: I have reviewed the patient's current medications.  Results for orders placed or performed during the hospital encounter of 01/20/16 (from the past 48 hour(s))  Comprehensive metabolic panel     Status: Abnormal   Collection Time: 01/20/16  4:03 PM  Result Value Ref Range   Sodium 135 135 - 145 mmol/L   Potassium 3.2 (L) 3.5 - 5.1 mmol/L   Chloride 98 (L) 101 - 111 mmol/L   CO2 21 (L) 22 - 32 mmol/L   Glucose, Bld 140 (H) 65 - 99 mg/dL   BUN 16 6 - 20 mg/dL   Creatinine, Ser 1.11 (H) 0.44 - 1.00 mg/dL   Calcium 8.6 (L) 8.9 - 10.3 mg/dL   Total Protein 6.7 6.5 - 8.1 g/dL   Albumin 3.1 (L) 3.5 - 5.0 g/dL   AST 354 (H) 15 - 41 U/L   ALT 208 (H) 14 - 54 U/L   Alkaline Phosphatase 153 (H) 38 - 126 U/L   Total Bilirubin 4.0 (H) 0.3 - 1.2 mg/dL   GFR calc non Af Amer 42 (L) >60 mL/min   GFR calc Af Amer 48 (L) >60 mL/min    Comment:  (NOTE) The eGFR has been calculated using the CKD EPI equation. This calculation has not been validated in all clinical situations. eGFR's persistently <60 mL/min signify possible Chronic Kidney Disease.    Anion gap 16 (H) 5 - 15  Lipase, blood     Status: None   Collection Time: 01/20/16  4:03 PM  Result Value Ref Range   Lipase 17 11 - 51 U/L  CBC with Differential     Status: Abnormal   Collection Time: 01/20/16  4:03 PM  Result Value Ref Range   WBC 9.1 4.0 - 10.5 K/uL   RBC 3.58 (L) 3.87 - 5.11 MIL/uL   Hemoglobin 10.2 (L) 12.0 - 15.0 g/dL   HCT 31.0 (L) 36.0 - 46.0 %   MCV 86.6 78.0 - 100.0 fL   MCH 28.5 26.0 - 34.0 pg   MCHC 32.9 30.0 - 36.0 g/dL   RDW 14.4 11.5 - 15.5 %   Platelets 145 (L) 150 - 400 K/uL   Neutrophils Relative % 95 %   Neutro Abs 8.6 (H) 1.7 - 7.7 K/uL   Lymphocytes Relative 1 %   Lymphs Abs 0.1 (L) 0.7 - 4.0 K/uL   Monocytes Relative 4 %   Monocytes Absolute 0.3 0.1 - 1.0 K/uL   Eosinophils Relative 0 %   Eosinophils Absolute 0.0 0.0 - 0.7 K/uL   Basophils Relative 0 %   Basophils Absolute 0.0 0.0 - 0.1 K/uL  Troponin I     Status: None   Collection Time: 01/20/16  4:03 PM  Result Value Ref Range   Troponin I <0.03 <0.031 ng/mL    Comment:        NO INDICATION OF MYOCARDIAL INJURY.   Comprehensive metabolic panel     Status: Abnormal   Collection Time: 01/21/16  5:21 AM  Result Value Ref Range   Sodium 136 135 - 145 mmol/L   Potassium 3.6 3.5 - 5.1 mmol/L   Chloride 102 101 - 111 mmol/L   CO2 23 22 - 32 mmol/L   Glucose, Bld 83 65 - 99 mg/dL   BUN 24 (H) 6 - 20 mg/dL   Creatinine, Ser 1.93 (H) 0.44 - 1.00 mg/dL   Calcium 7.9 (L) 8.9 - 10.3 mg/dL   Total Protein 6.2 (L) 6.5 - 8.1 g/dL   Albumin 2.8 (L) 3.5 - 5.0 g/dL   AST 244 (H) 15 - 41 U/L   ALT 193 (H) 14 - 54 U/L   Alkaline Phosphatase 98 38 - 126 U/L   Total Bilirubin 1.8 (H) 0.3 - 1.2 mg/dL  GFR calc non Af Amer 21 (L) >60 mL/min   GFR calc Af Amer 25 (L) >60 mL/min     Comment: (NOTE) The eGFR has been calculated using the CKD EPI equation. This calculation has not been validated in all clinical situations. eGFR's persistently <60 mL/min signify possible Chronic Kidney Disease.    Anion gap 11 5 - 15  CBC     Status: Abnormal   Collection Time: 01/21/16  5:21 AM  Result Value Ref Range   WBC 24.4 (H) 4.0 - 10.5 K/uL   RBC 3.21 (L) 3.87 - 5.11 MIL/uL   Hemoglobin 9.1 (L) 12.0 - 15.0 g/dL   HCT 27.4 (L) 36.0 - 46.0 %   MCV 85.4 78.0 - 100.0 fL   MCH 28.3 26.0 - 34.0 pg   MCHC 33.2 30.0 - 36.0 g/dL   RDW 15.1 11.5 - 15.5 %   Platelets 136 (L) 150 - 400 K/uL    Ct Abdomen Pelvis W Contrast  01/20/2016  CLINICAL DATA:  Nausea and vomiting for 1 day EXAM: CT ABDOMEN AND PELVIS WITH CONTRAST TECHNIQUE: Multidetector CT imaging of the abdomen and pelvis was performed using the standard protocol following bolus administration of intravenous contrast. CONTRAST:  151m ISOVUE-300 IOPAMIDOL (ISOVUE-300) INJECTION 61% COMPARISON:  01/20/2016, 05/06/2015 FINDINGS: Lung bases demonstrate some dependent atelectatic changes without focal confluent infiltrate. The liver demonstrates a somewhat mottled enhancement pattern in the midportion of the right lobe of the liver likely related to the underlying biliary ductal dilatation and some change in the degree of per fusion. Gallbladder is well distended. The changes seen on recent ultrasound are less well appreciated on the current exam. Common bile duct is dilated to 17 mm. This extends inferiorly to the level of the ampulla. A a somewhat rounded filling defect is noted. This may represent a poorly calcified stone although the possibility of underlying mass lesion would deserve consideration as well. No significant pancreatic ductal dilatation is seen. Spleen, adrenal glands, pancreas and kidneys are otherwise within normal limits. The bladder is well distended. The uterus again show some calcific changes consistent with uterine  fibroids. Central decreased attenuation is noted which may also be related to underlying fibroid disease. No pelvic mass lesion or sidewall adenopathy is noted. Mild aortoiliac calcifications are seen without aneurysmal dilatation. The osseous structures show degenerative change of the lumbar spine. No acute compression deformity is noted. IMPRESSION: Significant biliary ductal dilatation with dilatation of the gallbladder. A rounded soft tissue density is noted distally best seen on the coronal imaging. A neoplasm would be difficult to exclude. ERCP is recommended for further evaluation as well as drainage. Changes in the degree of perfusion of the liver likely related to the biliary ductal dilatation. There are changes suggestive of some thrombosis of some of the right portal venous branches best seen on image number 14 of series 2. Chronic changes as described. Electronically Signed   By: MInez CatalinaM.D.   On: 01/20/2016 18:29   [place] Ruq  01/20/2016  CLINICAL DATA:  Nausea and vomiting since this morning. EXAM: [place] - RIGHT UPPER QUADRANT COMPARISON:  CT 05/03/2015 and MRI 05/06/2015 FINDINGS: Gallbladder: Hydropic gallbladder measuring 11.2 cm in length. Moderate gallbladder sludge with several small stones the largest measuring 3 mm. There is wall thickening measuring up to 9 mm with pericholecystic fluid. Positive sonographic Murphy's sign. Common bile duct: Diameter: 18 mm without evidence of choledocholithiasis. Liver: Center intrahepatic duct dilatation. IMPRESSION: Dilated gallbladder with  constellation of findings which may be due to acute cholecystitis. Moderate dilatation of the common bile duct without evidence of ductal stones. Intrahepatic ductal dilatation. Further evaluation with cross-sectional imaging may be helpful to exclude a more distal obstructing process at the level of the head of the pancreas/ampulla. Electronically Signed   By: Marin Olp M.D.   On:  01/20/2016 18:01    Review of Systems  Neurological: Positive for sensory change.   Blood pressure 84/46, pulse 91, temperature 98.1 F (36.7 C), temperature source Oral, resp. rate 16, height 5' 7"  (1.702 m), weight 135 lb (61.236 kg), SpO2 95 %. Physical Exam  Assessment/Plan: Biliary obstruction, Dilated CBD. Will discuss with Dr. Laural Golden. Possible ERCP tomorrow.  Cipro and flagyl IV every 12 hrs. SETZER,TERRI W 01/21/2016, 11:56 AM

## 2016-01-22 ENCOUNTER — Encounter (HOSPITAL_COMMUNITY): Payer: Self-pay

## 2016-01-22 ENCOUNTER — Inpatient Hospital Stay (HOSPITAL_COMMUNITY): Payer: Medicare Other | Admitting: Anesthesiology

## 2016-01-22 ENCOUNTER — Inpatient Hospital Stay (HOSPITAL_COMMUNITY): Payer: Medicare Other

## 2016-01-22 ENCOUNTER — Encounter (HOSPITAL_COMMUNITY)
Admission: EM | Disposition: A | Discharge status: Skilled Nursing Facility | Payer: Self-pay | Source: Home / Self Care | Attending: Internal Medicine

## 2016-01-22 DIAGNOSIS — K8051 Calculus of bile duct without cholangitis or cholecystitis with obstruction: Secondary | ICD-10-CM

## 2016-01-22 DIAGNOSIS — K838 Other specified diseases of biliary tract: Secondary | ICD-10-CM

## 2016-01-22 DIAGNOSIS — R17 Unspecified jaundice: Secondary | ICD-10-CM

## 2016-01-22 HISTORY — PX: ERCP: SHX5425

## 2016-01-22 LAB — CBC
HEMATOCRIT: 27.7 % — AB (ref 36.0–46.0)
Hemoglobin: 9.7 g/dL — ABNORMAL LOW (ref 12.0–15.0)
MCH: 29.8 pg (ref 26.0–34.0)
MCHC: 35 g/dL (ref 30.0–36.0)
MCV: 85.2 fL (ref 78.0–100.0)
Platelets: 128 10*3/uL — ABNORMAL LOW (ref 150–400)
RBC: 3.25 MIL/uL — ABNORMAL LOW (ref 3.87–5.11)
RDW: 15.4 % (ref 11.5–15.5)
WBC: 29.3 10*3/uL — ABNORMAL HIGH (ref 4.0–10.5)

## 2016-01-22 LAB — URINALYSIS, ROUTINE W REFLEX MICROSCOPIC
Glucose, UA: NEGATIVE mg/dL
Ketones, ur: NEGATIVE mg/dL
NITRITE: NEGATIVE
PROTEIN: 30 mg/dL — AB
SPECIFIC GRAVITY, URINE: 1.025 (ref 1.005–1.030)
pH: 5 (ref 5.0–8.0)

## 2016-01-22 LAB — COMPREHENSIVE METABOLIC PANEL
ALK PHOS: 90 U/L (ref 38–126)
ALT: 150 U/L — AB (ref 14–54)
AST: 120 U/L — ABNORMAL HIGH (ref 15–41)
Albumin: 2.5 g/dL — ABNORMAL LOW (ref 3.5–5.0)
Anion gap: 8 (ref 5–15)
BUN: 40 mg/dL — ABNORMAL HIGH (ref 6–20)
CALCIUM: 7.4 mg/dL — AB (ref 8.9–10.3)
CO2: 21 mmol/L — AB (ref 22–32)
CREATININE: 1.68 mg/dL — AB (ref 0.44–1.00)
Chloride: 108 mmol/L (ref 101–111)
GFR, EST AFRICAN AMERICAN: 29 mL/min — AB (ref >60)
GFR, EST NON AFRICAN AMERICAN: 25 mL/min — AB (ref >60)
Glucose, Bld: 71 mg/dL (ref 65–99)
Potassium: 4.3 mmol/L (ref 3.5–5.1)
Sodium: 137 mmol/L (ref 135–145)
Total Bilirubin: 1.1 mg/dL (ref 0.3–1.2)
Total Protein: 5.8 g/dL — ABNORMAL LOW (ref 6.5–8.1)

## 2016-01-22 LAB — URINE MICROSCOPIC-ADD ON

## 2016-01-22 LAB — GLUCOSE, CAPILLARY
Glucose-Capillary: 72 mg/dL (ref 65–99)
Glucose-Capillary: 115 mg/dL — ABNORMAL HIGH (ref 65–99)

## 2016-01-22 LAB — PROTIME-INR
INR: 1.55 — AB (ref 0.00–1.49)
PROTHROMBIN TIME: 18.6 s — AB (ref 11.6–15.2)

## 2016-01-22 SURGERY — ERCP, WITH INTERVENTION IF INDICATED
Anesthesia: General

## 2016-01-22 MED ORDER — DEXTROSE 50 % IV SOLN
INTRAVENOUS | Status: AC
  Filled 2016-01-22: qty 50

## 2016-01-22 MED ORDER — LACTATED RINGERS IV SOLN
INTRAVENOUS | Status: DC
  Administered 2016-01-22: 14:00:00 via INTRAVENOUS

## 2016-01-22 MED ORDER — LIDOCAINE HCL 1 % IJ SOLN
INTRAMUSCULAR | Status: DC | PRN
  Administered 2016-01-22: 10 mg via INTRADERMAL

## 2016-01-22 MED ORDER — IOPAMIDOL (ISOVUE-300) INJECTION 61%
INTRAVENOUS | Status: AC
  Filled 2016-01-22: qty 50

## 2016-01-22 MED ORDER — FENTANYL CITRATE (PF) 100 MCG/2ML IJ SOLN
INTRAMUSCULAR | Status: DC | PRN
  Administered 2016-01-22 (×2): 50 ug via INTRAVENOUS
  Administered 2016-01-22: 25 ug via INTRAVENOUS

## 2016-01-22 MED ORDER — PHENYLEPHRINE HCL 10 MG/ML IJ SOLN
INTRAMUSCULAR | Status: DC | PRN
  Administered 2016-01-22: 40 ug via INTRAVENOUS

## 2016-01-22 MED ORDER — VITAMIN K1 10 MG/ML IJ SOLN
10.0000 mg | Freq: Once | INTRAMUSCULAR | Status: AC
  Administered 2016-01-22: 10 mg via SUBCUTANEOUS
  Filled 2016-01-22: qty 1

## 2016-01-22 MED ORDER — MIDAZOLAM HCL 2 MG/2ML IJ SOLN
1.0000 mg | INTRAMUSCULAR | Status: DC | PRN
  Administered 2016-01-22: 1 mg via INTRAVENOUS
  Administered 2016-01-22: 2 mg via INTRAVENOUS

## 2016-01-22 MED ORDER — SUCCINYLCHOLINE CHLORIDE 20 MG/ML IJ SOLN
INTRAMUSCULAR | Status: DC | PRN
  Administered 2016-01-22: 120 mg via INTRAVENOUS

## 2016-01-22 MED ORDER — FENTANYL CITRATE (PF) 100 MCG/2ML IJ SOLN
INTRAMUSCULAR | Status: AC
  Filled 2016-01-22: qty 2

## 2016-01-22 MED ORDER — SODIUM CHLORIDE 0.9 % IV SOLN
INTRAVENOUS | Status: DC | PRN
  Administered 2016-01-22: 100 mL

## 2016-01-22 MED ORDER — ETOMIDATE 2 MG/ML IV SOLN
INTRAVENOUS | Status: DC | PRN
  Administered 2016-01-22: 10 mg via INTRAVENOUS

## 2016-01-22 MED ORDER — FENTANYL CITRATE (PF) 100 MCG/2ML IJ SOLN: 25.0000 ug | INTRAMUSCULAR | Status: DC | PRN

## 2016-01-22 MED ORDER — STERILE WATER FOR IRRIGATION IR SOLN
Status: DC | PRN
  Administered 2016-01-22: 1000 mL

## 2016-01-22 MED ORDER — MIDAZOLAM HCL 2 MG/2ML IJ SOLN
INTRAMUSCULAR | Status: AC
  Filled 2016-01-22: qty 2

## 2016-01-22 MED ORDER — ONDANSETRON HCL 4 MG/2ML IJ SOLN: 4.0000 mg | Freq: Once | INTRAMUSCULAR | Status: DC | PRN

## 2016-01-22 MED ORDER — ONDANSETRON HCL 4 MG/2ML IJ SOLN
4.0000 mg | Freq: Once | INTRAMUSCULAR | Status: AC
  Administered 2016-01-22: 4 mg via INTRAVENOUS

## 2016-01-22 MED ORDER — GLUCAGON HCL RDNA (DIAGNOSTIC) 1 MG IJ SOLR
INTRAMUSCULAR | Status: DC | PRN
  Administered 2016-01-22: 0.25 mg via INTRAVENOUS

## 2016-01-22 MED ORDER — GLUCAGON HCL RDNA (DIAGNOSTIC) 1 MG IJ SOLR
INTRAMUSCULAR | Status: AC
  Filled 2016-01-22: qty 2

## 2016-01-22 MED ORDER — DEXTROSE 50 % IV SOLN
12.5000 g | Freq: Once | INTRAVENOUS | Status: AC
  Administered 2016-01-22: 12.5 g via INTRAVENOUS

## 2016-01-22 SURGICAL SUPPLY — 23 items
BAG HAMPER (MISCELLANEOUS) ×3 IMPLANT
BALLN RETRIEVAL 12X15 (BALLOONS) IMPLANT
BALLN RETRIEVAL 12X15MM (BALLOONS)
BASKET TRAPEZOID 3X6 (MISCELLANEOUS) IMPLANT
DEVICE INFLATION ENCORE 26 (MISCELLANEOUS) IMPLANT
DEVICE LOCKING W-BIOPSY CAP (MISCELLANEOUS) ×3 IMPLANT
GUIDEWIRE HYDRA JAGWIRE .35 (WIRE) IMPLANT
GUIDEWIRE JAG HINI 025X260CM (WIRE) IMPLANT
KIT ENDO PROCEDURE PEN (KITS) ×3 IMPLANT
KIT ROOM TURNOVER APOR (KITS) ×3 IMPLANT
LUBRICANT JELLY 4.5OZ STERILE (MISCELLANEOUS) IMPLANT
PAD ARMBOARD 7.5X6 YLW CONV (MISCELLANEOUS) ×3 IMPLANT
PATHFINDER 450CM 0.18 (STENTS) IMPLANT
POSITIONER HEAD 8X9X4 ADT (SOFTGOODS) IMPLANT
SCOPE SPY DS DISPOSABLE (MISCELLANEOUS) IMPLANT
SNARE ROTATE MED OVAL 20MM (MISCELLANEOUS) IMPLANT
SNARE SHORT THROW 13M SML OVAL (MISCELLANEOUS) IMPLANT
SPHINCTEROTOME AUTOTOME .25 (MISCELLANEOUS) IMPLANT
SPHINCTEROTOME HYDRATOME 44 (MISCELLANEOUS) IMPLANT
TUBING INSUFFLATOR CO2MPACT (TUBING) ×3 IMPLANT
WALLSTENT METAL COVERED 10X60 (STENTS) IMPLANT
WALLSTENT METAL COVERED 10X80 (STENTS) IMPLANT
WATER STERILE IRR 1000ML POUR (IV SOLUTION) ×3 IMPLANT

## 2016-01-22 NOTE — Op Note (Signed)
[facility] Patient Name: Jo Hardin Procedure Date: 01/22/2016 1:13 PM MRN: [id number] Date of Birth: 1922/05/08 Attending MD: Hildred Laser , MD CSN: [id number] Age: 82 Admit Type: Inpatient Procedure:                ERCP Indications:              Evaluation and possible treatment of bile duct                            stone(s), Jaundice Providers:                Hildred Laser, MD, Renda Rolls, RN, Georgeann Oppenheim,                            Technician, Randa Spike, Technician Referring MD:             [name], MD Medicines:                General Anesthesia Complications:            No immediate complications. Estimated Blood Loss:     Estimated blood loss: none. Procedure:                Pre-Anesthesia Assessment:                           - Prior to the procedure, a History and Physical                            was performed, and patient medications and                            allergies were reviewed. The patient's tolerance of                            previous anesthesia was also reviewed. The risks                            and benefits of the procedure and the sedation                            options and risks were discussed with the patient.                            All questions were answered, and informed consent                            was obtained. Prior Anticoagulants: The patient                            last took Lovenox (enoxaparin) 1 day prior to the                            procedure. ASA Grade Assessment: III - A patient  with severe systemic disease. After reviewing the                            risks and benefits, the patient was deemed in                            satisfactory condition to undergo the procedure.                           After obtaining informed consent, the scope was                            passed under direct vision. Throughout the                            procedure, the  patient's blood pressure, pulse, and                            oxygen saturations were monitored continuously. The                            [id number]) scope was introduced through                            the mouth, and used to inject contrast into and                            used to inject contrast into the bile duct. The                            ERCP was accomplished without difficulty. The                            patient tolerated the procedure well. Scope In: 2:28:02 PM Scope Out: 2:53:42 PM Total Procedure Duration: 0 hours 25 minutes 40 seconds  Findings:      The scout film was normal. The major papilla was normal. The bile duct       was deeply cannulated with the traction (standard) sphincterotome and       Autotome sphincterotome. Contrast was injected. I personally interpreted       the bile duct images. Image quality was excellent. Image quality was       adequate. Contrast extended to the entire biliary tree.       Choledocholithiasis was found in a nondilated duct. The main bile duct       was severely dilated. The largest diameter was 18 mm. The common hepatic       duct contained two stones, the largest of which was 8 mm in diameter.       The entire biliary tree was diffusely dilated, with a stone causing an       obstruction. The largest diameter was 17 mm. An 8 mm biliary       sphincterotomy was made with a monofilament traction (standard)       sphincterotome using ERBE electrocautery. There was no       post-sphincterotomy bleeding.  The biliary tree was swept with a basket.       Sludge was swept from the duct. Two stones were removed. No stones       remained. Impression:               - The major papilla appeared normal.                           - The entire main bile duct was severely dilated.                           - Choledocholithiasis was found. Complete removal                            was accomplished by biliary sphincterotomy and                             basket extraction.                           - A biliary sphincterotomy was performed.                            sphincterotomy elated with TTS balloon dilator from                            212 mm.                           - The biliary tree was swept with removal of stones                            and debris. Moderate Sedation:      Per Anesthesia Care Recommendation:           - Avoid aspirin and nonsteroidal anti-inflammatory                            medicines for 3 days.                           - Return patient to hospital ward for ongoing care.                           - Clear liquid diet today. Procedure Code(s):        --- Professional ---                           [phone], Endoscopic retrograde                            cholangiopancreatography (ERCP); with removal of                            calculi/debris from biliary/pancreatic duct(s)                           [id number], Endoscopic  retrograde                            cholangiopancreatography (ERCP); with                            sphincterotomy/papillotomy Diagnosis Code(s):        --- Professional ---                           R17, Unspecified jaundice                           K83.8, Other specified diseases of biliary tract                           K80.51, Calculus of bile duct without cholangitis                            or cholecystitis with obstruction CPT copyright 2016 [employer]. All rights reserved. The codes documented in this report are preliminary and upon coder review may  be revised to meet current compliance requirements. Hildred Laser, MD Hildred Laser, MD 01/22/2016 3:22:00 PM This report has been signed electronically. Number of Addenda: 0

## 2016-01-22 NOTE — Anesthesia Preprocedure Evaluation (Signed)
Anesthesia Evaluation  Patient identified by MRN, date of birth, ID band Patient awake    Reviewed: Allergy & Precautions, NPO status , Patient's Chart, lab work & pertinent test results  Airway Mallampati: III  TM Distance: <3 FB   Mouth opening: Limited Mouth Opening  Dental  (+) Edentulous Upper, Edentulous Lower   Pulmonary pneumonia, resolved,    breath sounds clear to auscultation       Cardiovascular hypertension, Pt. on medications  Rhythm:Regular Rate:Normal     Neuro/Psych    GI/Hepatic GERD  Controlled and Medicated,Biliary obstruction Panreatitis    Endo/Other  Hypothyroidism   Renal/GU      Musculoskeletal   Abdominal   Peds  Hematology  (+) anemia ,   Anesthesia Other Findings   Reproductive/Obstetrics                             Anesthesia Physical Anesthesia Plan  ASA: III  Anesthesia Plan: General   Post-op Pain Management:    Induction: Intravenous  Airway Management Planned: Oral ETT and Video Laryngoscope Planned  Additional Equipment:   Intra-op Plan:   Post-operative Plan: Extubation in OR  Informed Consent: I have reviewed the patients History and Physical, chart, labs and discussed the procedure including the risks, benefits and alternatives for the proposed anesthesia with the patient or authorized representative who has indicated his/her understanding and acceptance.     Plan Discussed with:   Anesthesia Plan Comments: (Amidate induction)        Anesthesia Quick Evaluation

## 2016-01-22 NOTE — Progress Notes (Signed)
Subjective: Patient is more alert and awake. She feels better. Her LFT is improving. Patient is scheduled for ERCP for today. Objective: Vital signs in last 24 hours: Temp:  [98.7 F (37.1 C)-99.2 F (37.3 C)] 98.7 F (37.1 C) (05/25 0625) Pulse Rate:  [78-87] 78 (05/25 0625) Resp:  [24-26] 24 (05/25 0625) BP: (90-98)/(48-52) 98/52 mmHg (05/25 0625) SpO2:  [92 %-97 %] 97 % (05/25 0625) Weight change:     Intake/Output from previous day: 05/24 0701 - 05/25 0700 In: -  Out: 600 [Urine:600]  PHYSICAL EXAM General appearance: alert and slowed mentation Resp: clear to auscultation bilaterally Cardio: S1, S2 normal GI: soft and lax, mild RUO tenderness Extremities: extremities normal, atraumatic, no cyanosis or edema  Lab Results:  Results for orders placed or performed during the hospital encounter of 01/20/16 (from the past 48 hour(s))  Comprehensive metabolic panel     Status: Abnormal   Collection Time: 01/20/16  4:03 PM  Result Value Ref Range   Sodium 135 135 - 145 mmol/L   Potassium 3.2 (L) 3.5 - 5.1 mmol/L   Chloride 98 (L) 101 - 111 mmol/L   CO2 21 (L) 22 - 32 mmol/L   Glucose, Bld 140 (H) 65 - 99 mg/dL   BUN 16 6 - 20 mg/dL   Creatinine, Ser 1.11 (H) 0.44 - 1.00 mg/dL   Calcium 8.6 (L) 8.9 - 10.3 mg/dL   Total Protein 6.7 6.5 - 8.1 g/dL   Albumin 3.1 (L) 3.5 - 5.0 g/dL   AST 354 (H) 15 - 41 U/L   ALT 208 (H) 14 - 54 U/L   Alkaline Phosphatase 153 (H) 38 - 126 U/L   Total Bilirubin 4.0 (H) 0.3 - 1.2 mg/dL   GFR calc non Af Amer 42 (L) >60 mL/min   GFR calc Af Amer 48 (L) >60 mL/min    Comment: (NOTE) The eGFR has been calculated using the CKD EPI equation. This calculation has not been validated in all clinical situations. eGFR's persistently <60 mL/min signify possible Chronic Kidney Disease.    Anion gap 16 (H) 5 - 15  Lipase, blood     Status: None   Collection Time: 01/20/16  4:03 PM  Result Value Ref Range   Lipase 17 11 - 51 U/L  CBC with  Differential     Status: Abnormal   Collection Time: 01/20/16  4:03 PM  Result Value Ref Range   WBC 9.1 4.0 - 10.5 K/uL   RBC 3.58 (L) 3.87 - 5.11 MIL/uL   Hemoglobin 10.2 (L) 12.0 - 15.0 g/dL   HCT 31.0 (L) 36.0 - 46.0 %   MCV 86.6 78.0 - 100.0 fL   MCH 28.5 26.0 - 34.0 pg   MCHC 32.9 30.0 - 36.0 g/dL   RDW 14.4 11.5 - 15.5 %   Platelets 145 (L) 150 - 400 K/uL   Neutrophils Relative % 95 %   Neutro Abs 8.6 (H) 1.7 - 7.7 K/uL   Lymphocytes Relative 1 %   Lymphs Abs 0.1 (L) 0.7 - 4.0 K/uL   Monocytes Relative 4 %   Monocytes Absolute 0.3 0.1 - 1.0 K/uL   Eosinophils Relative 0 %   Eosinophils Absolute 0.0 0.0 - 0.7 K/uL   Basophils Relative 0 %   Basophils Absolute 0.0 0.0 - 0.1 K/uL  Troponin I     Status: None   Collection Time: 01/20/16  4:03 PM  Result Value Ref Range   Troponin I <0.03 <0.031 ng/mL  Comment:        NO INDICATION OF MYOCARDIAL INJURY.   Comprehensive metabolic panel     Status: Abnormal   Collection Time: 01/21/16  5:21 AM  Result Value Ref Range   Sodium 136 135 - 145 mmol/L   Potassium 3.6 3.5 - 5.1 mmol/L   Chloride 102 101 - 111 mmol/L   CO2 23 22 - 32 mmol/L   Glucose, Bld 83 65 - 99 mg/dL   BUN 24 (H) 6 - 20 mg/dL   Creatinine, Ser 1.93 (H) 0.44 - 1.00 mg/dL   Calcium 7.9 (L) 8.9 - 10.3 mg/dL   Total Protein 6.2 (L) 6.5 - 8.1 g/dL   Albumin 2.8 (L) 3.5 - 5.0 g/dL   AST 244 (H) 15 - 41 U/L   ALT 193 (H) 14 - 54 U/L   Alkaline Phosphatase 98 38 - 126 U/L   Total Bilirubin 1.8 (H) 0.3 - 1.2 mg/dL   GFR calc non Af Amer 21 (L) >60 mL/min   GFR calc Af Amer 25 (L) >60 mL/min    Comment: (NOTE) The eGFR has been calculated using the CKD EPI equation. This calculation has not been validated in all clinical situations. eGFR's persistently <60 mL/min signify possible Chronic Kidney Disease.    Anion gap 11 5 - 15  CBC     Status: Abnormal   Collection Time: 01/21/16  5:21 AM  Result Value Ref Range   WBC 24.4 (H) 4.0 - 10.5 K/uL   RBC  3.21 (L) 3.87 - 5.11 MIL/uL   Hemoglobin 9.1 (L) 12.0 - 15.0 g/dL   HCT 27.4 (L) 36.0 - 46.0 %   MCV 85.4 78.0 - 100.0 fL   MCH 28.3 26.0 - 34.0 pg   MCHC 33.2 30.0 - 36.0 g/dL   RDW 15.1 11.5 - 15.5 %   Platelets 136 (L) 150 - 400 K/uL  Comprehensive metabolic panel     Status: Abnormal   Collection Time: 01/22/16  4:19 AM  Result Value Ref Range   Sodium 137 135 - 145 mmol/L   Potassium 4.3 3.5 - 5.1 mmol/L   Chloride 108 101 - 111 mmol/L   CO2 21 (L) 22 - 32 mmol/L   Glucose, Bld 71 65 - 99 mg/dL   BUN 40 (H) 6 - 20 mg/dL   Creatinine, Ser 1.68 (H) 0.44 - 1.00 mg/dL   Calcium 7.4 (L) 8.9 - 10.3 mg/dL   Total Protein 5.8 (L) 6.5 - 8.1 g/dL   Albumin 2.5 (L) 3.5 - 5.0 g/dL   AST 120 (H) 15 - 41 U/L   ALT 150 (H) 14 - 54 U/L   Alkaline Phosphatase 90 38 - 126 U/L   Total Bilirubin 1.1 0.3 - 1.2 mg/dL   GFR calc non Af Amer 25 (L) >60 mL/min   GFR calc Af Amer 29 (L) >60 mL/min    Comment: (NOTE) The eGFR has been calculated using the CKD EPI equation. This calculation has not been validated in all clinical situations. eGFR's persistently <60 mL/min signify possible Chronic Kidney Disease.    Anion gap 8 5 - 15  Protime-INR     Status: Abnormal   Collection Time: 01/22/16  4:19 AM  Result Value Ref Range   Prothrombin Time 18.6 (H) 11.6 - 15.2 seconds   INR 1.55 (H) 0.00 - 1.49  CBC     Status: Abnormal   Collection Time: 01/22/16  4:19 AM  Result Value Ref Range   WBC  29.3 (H) 4.0 - 10.5 K/uL   RBC 3.25 (L) 3.87 - 5.11 MIL/uL   Hemoglobin 9.7 (L) 12.0 - 15.0 g/dL   HCT 27.7 (L) 36.0 - 46.0 %   MCV 85.2 78.0 - 100.0 fL   MCH 29.8 26.0 - 34.0 pg   MCHC 35.0 30.0 - 36.0 g/dL   RDW 15.4 11.5 - 15.5 %   Platelets 128 (L) 150 - 400 K/uL  Urinalysis, Routine w reflex microscopic     Status: Abnormal   Collection Time: 01/22/16  4:41 AM  Result Value Ref Range   Color, Urine YELLOW YELLOW   APPearance CLOUDY (A) CLEAR   Specific Gravity, Urine 1.025 1.005 - 1.030   pH  5.0 5.0 - 8.0   Glucose, UA NEGATIVE NEGATIVE mg/dL   Hgb urine dipstick LARGE (A) NEGATIVE   Bilirubin Urine MODERATE (A) NEGATIVE   Ketones, ur NEGATIVE NEGATIVE mg/dL   Protein, ur 30 (A) NEGATIVE mg/dL   Nitrite NEGATIVE NEGATIVE   Leukocytes, UA TRACE (A) NEGATIVE  Urine microscopic-add on     Status: Abnormal   Collection Time: 01/22/16  4:41 AM  Result Value Ref Range   Squamous Epithelial / LPF 0-5 (A) NONE SEEN   WBC, UA 0-5 0 - 5 WBC/hpf   RBC / HPF TOO NUMEROUS TO COUNT 0 - 5 RBC/hpf   Bacteria, UA MANY (A) NONE SEEN    ABGS No results for input(s): PHART, PO2ART, TCO2, HCO3 in the last 72 hours.  Invalid input(s): PCO2 CULTURES No results found for this or any previous visit (from the past 240 hour(s)). Studies/Results: Ct Abdomen Pelvis W Contrast  01/20/2016  CLINICAL DATA:  Nausea and vomiting for 1 day EXAM: CT ABDOMEN AND PELVIS WITH CONTRAST TECHNIQUE: Multidetector CT imaging of the abdomen and pelvis was performed using the standard protocol following bolus administration of intravenous contrast. CONTRAST:  122m ISOVUE-300 IOPAMIDOL (ISOVUE-300) INJECTION 61% COMPARISON:  01/20/2016, 05/06/2015 FINDINGS: Lung bases demonstrate some dependent atelectatic changes without focal confluent infiltrate. The liver demonstrates a somewhat mottled enhancement pattern in the midportion of the right lobe of the liver likely related to the underlying biliary ductal dilatation and some change in the degree of per fusion. Gallbladder is well distended. The changes seen on recent ultrasound are less well appreciated on the current exam. Common bile duct is dilated to 17 mm. This extends inferiorly to the level of the ampulla. A a somewhat rounded filling defect is noted. This may represent a poorly calcified stone although the possibility of underlying mass lesion would deserve consideration as well. No significant pancreatic ductal dilatation is seen. Spleen, adrenal glands, pancreas  and kidneys are otherwise within normal limits. The bladder is well distended. The uterus again show some calcific changes consistent with uterine fibroids. Central decreased attenuation is noted which may also be related to underlying fibroid disease. No pelvic mass lesion or sidewall adenopathy is noted. Mild aortoiliac calcifications are seen without aneurysmal dilatation. The osseous structures show degenerative change of the lumbar spine. No acute compression deformity is noted. IMPRESSION: Significant biliary ductal dilatation with dilatation of the gallbladder. A rounded soft tissue density is noted distally best seen on the coronal imaging. A neoplasm would be difficult to exclude. ERCP is recommended for further evaluation as well as drainage. Changes in the degree of perfusion of the liver likely related to the biliary ductal dilatation. There are changes suggestive of some thrombosis of some of the right portal venous branches best seen on  image number 14 of series 2. Chronic changes as described. Electronically Signed   By: Inez Catalina M.D.   On: 01/20/2016 18:29   US Abdomen Limited Ruq  01/20/2016  CLINICAL DATA:  Nausea and vomiting since this morning. EXAM: US ABDOMEN LIMITED - RIGHT UPPER QUADRANT COMPARISON:  CT 05/03/2015 and MRI 05/06/2015 FINDINGS: Gallbladder: Hydropic gallbladder measuring 11.2 cm in length. Moderate gallbladder sludge with several small stones the largest measuring 3 mm. There is wall thickening measuring up to 9 mm with pericholecystic fluid. Positive sonographic Murphy's sign. Common bile duct: Diameter: 18 mm without evidence of choledocholithiasis. Liver: Center intrahepatic duct dilatation. IMPRESSION: Dilated gallbladder with constellation of findings which may be due to acute cholecystitis. Moderate dilatation of the common bile duct without evidence of ductal stones. Intrahepatic ductal dilatation. Further evaluation with cross-sectional imaging may be helpful to  exclude a more distal obstructing process at the level of the head of the pancreas/ampulla. Electronically Signed   By: Marin Olp M.D.   On: 01/20/2016 18:01    Medications: I have reviewed the patient's current medications.  Assesment:   Principal Problem:   Biliary obstruction Active Problems:   Essential hypertension   Hypothyroidism   Hypotension   Jaundice   Abnormal LFTs   Hypovolemia   Abdominal pain   Nausea & vomiting   DNR (do not resuscitate)    Plan:  Medications reviewed Will keep NPO  GI consult  appreciated As per GI plan.    LOS: 2 days   Jo Hardin 01/22/2016, 8:24 AM

## 2016-01-22 NOTE — Progress Notes (Signed)
Patient seen earlier today. No abdominal pain reported. LFTs are improving. INR was 1.55 and she was given single dose of vitamin K 10 mg subcutaneous. For ERCP with sphincterotomy and possible stone extraction later today.

## 2016-01-22 NOTE — Anesthesia Procedure Notes (Signed)
Procedure Name: Intubation Date/Time: 01/22/2016 2:30 PM Performed by: Charmaine Downs Pre-anesthesia Checklist: Patient identified, Patient being monitored, Timeout performed, Emergency Drugs available and Suction available Patient Re-evaluated:Patient Re-evaluated prior to inductionOxygen Delivery Method: Circle System Utilized Preoxygenation: Pre-oxygenation with 100% oxygen Intubation Type: IV induction, Rapid sequence and Cricoid Pressure applied Ventilation: Mask ventilation without difficulty Laryngoscope Size: Mac and 3 Grade View: Grade I Tube type: Oral Tube size: 7.0 mm Number of attempts: 1 Airway Equipment and Method: Stylet and Video-laryngoscopy Placement Confirmation: ETT inserted through vocal cords under direct vision,  positive ETCO2 and breath sounds checked- equal and bilateral Secured at: 21 cm Tube secured with: Tape Dental Injury: Teeth and Oropharynx as per pre-operative assessment

## 2016-01-22 NOTE — Transfer of Care (Signed)
Immediate Anesthesia Transfer of Care Note  Patient: Jo Hardin  Procedure(s) Performed: Procedure(s): ENDOSCOPIC RETROGRADE CHOLANGIOPANCREATOGRAPHY (ERCP) With biliary sphincterotomy and stone extraction  (N/A)  Patient Location: PACU  Anesthesia Type:General  Level of Consciousness: sedated  Airway & Oxygen Therapy: Patient Spontanous Breathing  Post-op Assessment: Report given to RN  Post vital signs: Reviewed and stable  Last Vitals:  Filed Vitals:   01/22/16 1350 01/22/16 1355  BP: 137/78 135/76  Pulse:    Temp:    Resp: 26 23    Last Pain:  Filed Vitals:   01/22/16 1355  PainSc: Asleep      Patients Stated Pain Goal: 4 ([id number])  Complications: No apparent anesthesia complications

## 2016-01-22 NOTE — Anesthesia Postprocedure Evaluation (Signed)
Anesthesia Post Note  Patient: Jo Hardin  Procedure(s) Performed: Procedure(s) (LRB): ENDOSCOPIC RETROGRADE CHOLANGIOPANCREATOGRAPHY (ERCP) With biliary sphincterotomy and stone extraction  (N/A)  Patient location during evaluation: PACU Anesthesia Type: General Level of consciousness: awake Pain management: pain level controlled Vital Signs Assessment: post-procedure vital signs reviewed and stable Respiratory status: spontaneous breathing Cardiovascular status: blood pressure returned to baseline Postop Assessment: no signs of nausea or vomiting Anesthetic complications: no    Last Vitals:  Filed Vitals:   01/22/16 1545 01/22/16 1555  BP: 112/68   Pulse:    Temp:  36.9 C  Resp: 20     Last Pain:  Filed Vitals:   01/22/16 1557  PainSc: Asleep                 Lashya Passe

## 2016-01-23 ENCOUNTER — Encounter (HOSPITAL_COMMUNITY): Payer: Self-pay | Admitting: Internal Medicine

## 2016-01-23 ENCOUNTER — Inpatient Hospital Stay (HOSPITAL_COMMUNITY): Payer: Medicare Other

## 2016-01-23 LAB — COMPREHENSIVE METABOLIC PANEL
ALT: 100 U/L — AB (ref 14–54)
AST: 47 U/L — AB (ref 15–41)
Albumin: 2.3 g/dL — ABNORMAL LOW (ref 3.5–5.0)
Alkaline Phosphatase: 79 U/L (ref 38–126)
Anion gap: 5 (ref 5–15)
BILIRUBIN TOTAL: 1 mg/dL (ref 0.3–1.2)
BUN: 37 mg/dL — AB (ref 6–20)
CHLORIDE: 113 mmol/L — AB (ref 101–111)
CO2: 21 mmol/L — ABNORMAL LOW (ref 22–32)
CREATININE: 1.11 mg/dL — AB (ref 0.44–1.00)
Calcium: 7.5 mg/dL — ABNORMAL LOW (ref 8.9–10.3)
GFR calc Af Amer: 48 mL/min — ABNORMAL LOW (ref >60)
GFR, EST NON AFRICAN AMERICAN: 42 mL/min — AB (ref >60)
GLUCOSE: 87 mg/dL (ref 65–99)
Potassium: 4.3 mmol/L (ref 3.5–5.1)
Sodium: 139 mmol/L (ref 135–145)
Total Protein: 5.6 g/dL — ABNORMAL LOW (ref 6.5–8.1)

## 2016-01-23 LAB — AMYLASE: Amylase: 39 U/L (ref 28–100)

## 2016-01-23 LAB — CBC
HCT: 26.6 % — ABNORMAL LOW (ref 36.0–46.0)
Hemoglobin: 8.9 g/dL — ABNORMAL LOW (ref 12.0–15.0)
MCH: 28.4 pg (ref 26.0–34.0)
MCHC: 33.5 g/dL (ref 30.0–36.0)
MCV: 85 fL (ref 78.0–100.0)
PLATELETS: 116 10*3/uL — AB (ref 150–400)
RBC: 3.13 MIL/uL — ABNORMAL LOW (ref 3.87–5.11)
RDW: 15.7 % — AB (ref 11.5–15.5)
WBC: 20.2 10*3/uL — AB (ref 4.0–10.5)

## 2016-01-23 LAB — PROTIME-INR
INR: 1.15 (ref 0.00–1.49)
Prothrombin Time: 14.9 seconds (ref 11.6–15.2)

## 2016-01-23 MED ORDER — TRAMADOL HCL 50 MG PO TABS
50.0000 mg | ORAL_TABLET | Freq: Four times a day (QID) | ORAL | Status: DC | PRN
  Administered 2016-01-23 – 2016-01-27 (×6): 50 mg via ORAL
  Filled 2016-01-23 (×8): qty 1

## 2016-01-23 NOTE — Progress Notes (Signed)
Subjective: Patient had EGD with ERCP. She had removal of caliculi from biliary duct and sphincterotomy.She tolerated the procedure well. Her bilirubin level is back to normal. Patient is complaining of some soreness.  Objective: Vital signs in last 24 hours: Temp:  [98.4 F (36.9 C)-99.6 F (37.6 C)] 99.6 F (37.6 C) (05/26 0455) Pulse Rate:  [76-93] 85 (05/26 0455) Resp:  [11-26] 18 (05/26 0455) BP: (96-146)/(54-86) 107/59 mmHg (05/26 0455) SpO2:  [91 %-100 %] 96 % (05/26 0455) Weight:  [61.236 kg (135 lb)] 61.236 kg (135 lb) (05/25 1311) Weight change:  Last BM Date: 01/19/16  Intake/Output from previous day: 05/25 0701 - 05/26 0700 In: 550 [I.V.:550] Out: 600 [Urine:600]  PHYSICAL EXAM General appearance: alert and slowed mentation Resp: clear to auscultation bilaterally Cardio: S1, S2 normal GI: soft and lax, mild RUO tenderness Extremities: extremities normal, atraumatic, no cyanosis or edema  Lab Results:  Results for orders placed or performed during the hospital encounter of 01/20/16 (from the past 48 hour(s))  Comprehensive metabolic panel     Status: Abnormal   Collection Time: 01/22/16  4:19 AM  Result Value Ref Range   Sodium 137 135 - 145 mmol/L   Potassium 4.3 3.5 - 5.1 mmol/L   Chloride 108 101 - 111 mmol/L   CO2 21 (L) 22 - 32 mmol/L   Glucose, Bld 71 65 - 99 mg/dL   BUN 40 (H) 6 - 20 mg/dL   Creatinine, Ser 1.68 (H) 0.44 - 1.00 mg/dL   Calcium 7.4 (L) 8.9 - 10.3 mg/dL   Total Protein 5.8 (L) 6.5 - 8.1 g/dL   Albumin 2.5 (L) 3.5 - 5.0 g/dL   AST 120 (H) 15 - 41 U/L   ALT 150 (H) 14 - 54 U/L   Alkaline Phosphatase 90 38 - 126 U/L   Total Bilirubin 1.1 0.3 - 1.2 mg/dL   GFR calc non Af Amer 25 (L) >60 mL/min   GFR calc Af Amer 29 (L) >60 mL/min    Comment: (NOTE) The eGFR has been calculated using the CKD EPI equation. This calculation has not been validated in all clinical situations. eGFR's persistently <60 mL/min signify possible Chronic  Kidney Disease.    Anion gap 8 5 - 15  Protime-INR     Status: Abnormal   Collection Time: 01/22/16  4:19 AM  Result Value Ref Range   Prothrombin Time 18.6 (H) 11.6 - 15.2 seconds   INR 1.55 (H) 0.00 - 1.49  CBC     Status: Abnormal   Collection Time: 01/22/16  4:19 AM  Result Value Ref Range   WBC 29.3 (H) 4.0 - 10.5 K/uL   RBC 3.25 (L) 3.87 - 5.11 MIL/uL   Hemoglobin 9.7 (L) 12.0 - 15.0 g/dL   HCT 27.7 (L) 36.0 - 46.0 %   MCV 85.2 78.0 - 100.0 fL   MCH 29.8 26.0 - 34.0 pg   MCHC 35.0 30.0 - 36.0 g/dL   RDW 15.4 11.5 - 15.5 %   Platelets 128 (L) 150 - 400 K/uL  Urinalysis, Routine w reflex microscopic     Status: Abnormal   Collection Time: 01/22/16  4:41 AM  Result Value Ref Range   Color, Urine YELLOW YELLOW   APPearance CLOUDY (A) CLEAR   Specific Gravity, Urine 1.025 1.005 - 1.030   pH 5.0 5.0 - 8.0   Glucose, UA NEGATIVE NEGATIVE mg/dL   Hgb urine dipstick LARGE (A) NEGATIVE   Bilirubin Urine MODERATE (A) NEGATIVE  Ketones, ur NEGATIVE NEGATIVE mg/dL   Protein, ur 30 (A) NEGATIVE mg/dL   Nitrite NEGATIVE NEGATIVE   Leukocytes, UA TRACE (A) NEGATIVE  Urine microscopic-add on     Status: Abnormal   Collection Time: 01/22/16  4:41 AM  Result Value Ref Range   Squamous Epithelial / LPF 0-5 (A) NONE SEEN   WBC, UA 0-5 0 - 5 WBC/hpf   RBC / HPF TOO NUMEROUS TO COUNT 0 - 5 RBC/hpf   Bacteria, UA MANY (A) NONE SEEN  Glucose, capillary     Status: None   Collection Time: 01/22/16 12:47 PM  Result Value Ref Range   Glucose-Capillary 72 65 - 99 mg/dL  Glucose, capillary     Status: Abnormal   Collection Time: 01/22/16  3:52 PM  Result Value Ref Range   Glucose-Capillary 115 (H) 65 - 99 mg/dL  Comprehensive metabolic panel     Status: Abnormal   Collection Time: 01/23/16  5:57 AM  Result Value Ref Range   Sodium 139 135 - 145 mmol/L   Potassium 4.3 3.5 - 5.1 mmol/L   Chloride 113 (H) 101 - 111 mmol/L   CO2 21 (L) 22 - 32 mmol/L   Glucose, Bld 87 65 - 99 mg/dL    BUN 37 (H) 6 - 20 mg/dL   Creatinine, Ser 1.11 (H) 0.44 - 1.00 mg/dL   Calcium 7.5 (L) 8.9 - 10.3 mg/dL   Total Protein 5.6 (L) 6.5 - 8.1 g/dL   Albumin 2.3 (L) 3.5 - 5.0 g/dL   AST 47 (H) 15 - 41 U/L   ALT 100 (H) 14 - 54 U/L   Alkaline Phosphatase 79 38 - 126 U/L   Total Bilirubin 1.0 0.3 - 1.2 mg/dL   GFR calc non Af Amer 42 (L) >60 mL/min   GFR calc Af Amer 48 (L) >60 mL/min    Comment: (NOTE) The eGFR has been calculated using the CKD EPI equation. This calculation has not been validated in all clinical situations. eGFR's persistently <60 mL/min signify possible Chronic Kidney Disease.    Anion gap 5 5 - 15  CBC     Status: Abnormal   Collection Time: 01/23/16  5:57 AM  Result Value Ref Range   WBC 20.2 (H) 4.0 - 10.5 K/uL   RBC 3.13 (L) 3.87 - 5.11 MIL/uL   Hemoglobin 8.9 (L) 12.0 - 15.0 g/dL   HCT 26.6 (L) 36.0 - 46.0 %   MCV 85.0 78.0 - 100.0 fL   MCH 28.4 26.0 - 34.0 pg   MCHC 33.5 30.0 - 36.0 g/dL   RDW 15.7 (H) 11.5 - 15.5 %   Platelets 116 (L) 150 - 400 K/uL    Comment: SPECIMEN CHECKED FOR CLOTS PLATELET COUNT CONFIRMED BY SMEAR   Amylase     Status: None   Collection Time: 01/23/16  5:57 AM  Result Value Ref Range   Amylase 39 28 - 100 U/L  Protime-INR     Status: None   Collection Time: 01/23/16  5:57 AM  Result Value Ref Range   Prothrombin Time 14.9 11.6 - 15.2 seconds   INR 1.15 0.00 - 1.49    ABGS No results for input(s): PHART, PO2ART, TCO2, HCO3 in the last 72 hours.  Invalid input(s): PCO2 CULTURES No results found for this or any previous visit (from the past 240 hour(s)). Studies/Results: Dg Ercp With Sphincterotomy  01/22/2016  CLINICAL DATA:  Obstructive jaundice EXAM: ERCP TECHNIQUE: Multiple spot images obtained with the fluoroscopic  device and submitted for interpretation post-procedure. FLUOROSCOPY TIME:  2 minutes 36 second COMPARISON:  CT 01/20/2016 FINDINGS: A series of fluoroscopic clips document endoscopic cannulation and  opacification of the CBD. CBD dilated. Mobile filling defects in the proximal and mid CBD. Incomplete opacification of the central intrahepatic bile ducts, which appear mildly distended centrally. Endoscopic basket with stone capture. IMPRESSION: 1. Filling defects in dilated CBD, with endoscopic intervention These images were submitted for radiologic interpretation only. Please see the procedural report for the amount of contrast and the fluoroscopy time utilized. Electronically Signed   By: Lucrezia Europe M.D.   On: 01/22/2016 15:52    Medications: I have reviewed the patient's current medications.  Assesment:   Principal Problem:   Biliary obstruction Active Problems:   Essential hypertension   Hypothyroidism   Hypotension   Jaundice   Abnormal LFTs   Hypovolemia   Abdominal pain   Nausea & vomiting   DNR (do not resuscitate)    Plan:  Medications reviewed Advance diet as tolerated Will monitor CMP.   LOS: 3 days   Radonna Bracher 01/23/2016, 8:09 AM

## 2016-01-23 NOTE — Progress Notes (Signed)
  Subjective:  Patient complains of generalized abdominal soreness. She is hungry. She denies nausea vomiting. Daughter states she's been complaining of severe leg pain when she tries to move her left leg. She complains of left hip pain.   Objective: Blood pressure 107/59, pulse 85, temperature 99.6 F (37.6 C), temperature source Oral, resp. rate 18, height 5\' 7"  (1.702 m), weight 135 lb (61.236 kg), SpO2 96 %. Patient is alert and in no acute distress. Cardiac exam with regular rhythm normal S1 and S2. No murmur noted. Lungs are clear to auscultation. Abdomen is symmetrical. Appendectomy scar. Bowel sounds are normal. On palpation she has mild generalized tenderness without guarding or rebound. She is also tender in left inguinal region but no adenopathy. No LE edema or clubbing noted.  Labs/studies Results:   Recent Labs  01/21/16 0521 01/22/16 0419 01/23/16 0557  WBC 24.4* 29.3* 20.2*  HGB 9.1* 9.7* 8.9*  HCT 27.4* 27.7* 26.6*  PLT 136* 128* 116*    BMET   Recent Labs  01/21/16 0521 01/22/16 0419 01/23/16 0557  NA 136 137 139  K 3.6 4.3 4.3  CL 102 108 113*  CO2 23 21* 21*  GLUCOSE 83 71 87  BUN 24* 40* 37*  CREATININE 1.93* 1.68* 1.11*  CALCIUM 7.9* 7.4* 7.5*    LFT   Recent Labs  01/21/16 0521 01/22/16 0419 01/23/16 0557  PROT 6.2* 5.8* 5.6*  ALBUMIN 2.8* 2.5* 2.3*  AST 244* 120* 47*  ALT 193* 150* 100*  ALKPHOS 98 90 79  BILITOT 1.8* 1.1 1.0    PT/INR   Recent Labs  01/22/16 0419 01/23/16 0557  LABPROT 18.6* 14.9  INR 1.55* 1.15      Assessment:  #1.Choledocholithiasis and possible cholangitis. Patient underwent therapeutic ERCP yesterday with removal of 2 large stones. Significant improvement in transaminases since procedure. Serum amylase is normal. Leukocytosis improving. There may be another reason for persistent leukocytosis. #2. Left hip pain. Acute on chronic. Further evaluation per Dr. Legrand Rams. #3. Anemia. No evidence of overt GI  bleed. Drop in H&H felt to be due to hydration.   Recommendations:  Advance diet heart healthy diet. CBC in comprehensive chemistry panel in a.m. Continue Cipro and metronidazole IV for now. Gallbladder ultrasound later today to determine if gallbladder has decompressed.

## 2016-01-23 NOTE — Progress Notes (Signed)
Late entry for today.  The patients orthostatic vital signs were done on the patient,  I notified Dr.  Caryn Section.  Voiced to her that the patients cardiac drugs had not been given to her.  She verbalized understanding.

## 2016-01-23 NOTE — Addendum Note (Signed)
Addendum  created 01/23/16 0739 by Charmaine Downs, CRNA   Modules edited: Charges VN

## 2016-01-23 NOTE — Anesthesia Postprocedure Evaluation (Signed)
Anesthesia Post Note  Patient: Jenine Faso  Procedure(s) Performed: Procedure(s) (LRB): ENDOSCOPIC RETROGRADE CHOLANGIOPANCREATOGRAPHY (ERCP) With biliary sphincterotomy and stone extraction  (N/A)  Patient location during evaluation: Nursing Unit Anesthesia Type: General Level of consciousness: awake and alert and oriented Pain management: pain level controlled Vital Signs Assessment: post-procedure vital signs reviewed and stable Respiratory status: spontaneous breathing Cardiovascular status: stable Postop Assessment: no signs of nausea or vomiting Anesthetic complications: no    Last Vitals:  Filed Vitals:   01/23/16 0455 01/23/16 1312  BP: 107/59 152/75  Pulse: 85 75  Temp: 37.6 C 37.1 C  Resp: 18 18    Last Pain:  Filed Vitals:   01/23/16 1312  PainSc: 0-No pain                 ADAMS, AMY A

## 2016-01-23 NOTE — Addendum Note (Signed)
Addendum  created 01/23/16 1623 by Mickel Baas, CRNA   Modules edited: Charges VN, Clinical Notes   Clinical Notes:  File: [id number]

## 2016-01-23 NOTE — Care Management Note (Addendum)
Case Management Note  Patient Details  Name: Evadne Ibanez MRN: [id number] Date of Birth: Feb 11, 1922  Subjective/Objective Spoke with patient and family at the bedside. Patient is very weak from procedure and due to her age will need PT evaluation. Daughter [name] stated that she normally ambulates very well with walker at home.  Daughter stated that she is in the home and that the patient has 24 hour supervision  Daughter denies issues with medications.:               Action/Plan: Home with Home Health   Expected Discharge Date:                  Expected Discharge Plan:  [place]  In-House Referral:     Discharge planning Services  CM Consult  Post Acute Care Choice:    Choice offered to:     DME Arranged:    DME Agency:     HH Arranged:  PT [place]:  [place]  Status of Service:  In process, will continue to follow  Medicare Important Message Given:  Yes Date Medicare IM Given:    Medicare IM give by:    Date Additional Medicare IM Given:    Additional Medicare Important Message give by:     If discussed at [place] of Stay Meetings, dates discussed:    Additional Comments:  Alvie Heidelberg, RN 01/23/2016, 12:22 PM

## 2016-01-23 NOTE — Progress Notes (Signed)
Late Entry for today:  The patients family complained of the patients right hand swelling.  I cut off her identification bractlet and pink extremity bractlet.  I did notice that he does have an IV in that arm..  The patient family stated that nurse could not get an IV in the other arm and since the patient mastectomy was back in 1988.

## 2016-01-23 NOTE — Care Management Important Message (Signed)
Important Message  Patient Details  Name: Jo Hardin MRN: [id number] Date of Birth: 11-08-1921   Medicare Important Message Given:  Yes    Alvie Heidelberg, RN 01/23/2016, 8:48 AM

## 2016-01-23 NOTE — Progress Notes (Signed)
PT Cancellation Note  Patient Details Name: Jo Hardin MRN: [id number] DOB: November 27, 1921   Cancelled Treatment:    Reason Eval/Treat Not Completed: Pain limiting ability to participate;Fatigue/lethargy limiting ability to participate. PT entered room and conversed with pt, pt's niece, and pt's grandson. The patient appears lethargic, with difficulty maintaining eyes open, bradykinetic head movements, and RUE 'pill-rolling' (3-4hz ). The patient refuses OOB assessment multiple times. It is explained that PT evaluation will need to be competed prior to DC, however the patients level of motivation is unwavering. She reports that she feels very sore. Family says that this level of lethargy is not her baseline. Will attempt PT evaluation again at later date/time. If PT evaluation is needed for DC over the weekend, please update PT order to 'Imminent Discharge.'   3:57 PM, 01/23/2016 [name], PT, DPT PRN Physical Therapist - [place] License # [id number] [phone] (mobile)

## 2016-01-24 ENCOUNTER — Inpatient Hospital Stay (HOSPITAL_COMMUNITY): Payer: Medicare Other

## 2016-01-24 DIAGNOSIS — K831 Obstruction of bile duct: Secondary | ICD-10-CM

## 2016-01-24 DIAGNOSIS — R7989 Other specified abnormal findings of blood chemistry: Secondary | ICD-10-CM

## 2016-01-24 DIAGNOSIS — R1011 Right upper quadrant pain: Secondary | ICD-10-CM

## 2016-01-24 LAB — CBC
HEMATOCRIT: 27.5 % — AB (ref 36.0–46.0)
Hemoglobin: 9.1 g/dL — ABNORMAL LOW (ref 12.0–15.0)
MCH: 28.3 pg (ref 26.0–34.0)
MCHC: 33.1 g/dL (ref 30.0–36.0)
MCV: 85.7 fL (ref 78.0–100.0)
Platelets: 112 10*3/uL — ABNORMAL LOW (ref 150–400)
RBC: 3.21 MIL/uL — ABNORMAL LOW (ref 3.87–5.11)
RDW: 15.7 % — AB (ref 11.5–15.5)
WBC: 14.2 10*3/uL — ABNORMAL HIGH (ref 4.0–10.5)

## 2016-01-24 MED ORDER — METHYLPREDNISOLONE SODIUM SUCC 125 MG IJ SOLR
80.0000 mg | Freq: Once | INTRAMUSCULAR | Status: AC
  Administered 2016-01-24: 80 mg via INTRAVENOUS
  Filled 2016-01-24: qty 2

## 2016-01-24 MED ORDER — CIPROFLOXACIN HCL 250 MG PO TABS
500.0000 mg | ORAL_TABLET | Freq: Every day | ORAL | Status: DC
  Administered 2016-01-24 – 2016-01-27 (×4): 500 mg via ORAL
  Filled 2016-01-24 (×4): qty 2

## 2016-01-24 MED ORDER — METRONIDAZOLE 500 MG PO TABS
500.0000 mg | ORAL_TABLET | Freq: Three times a day (TID) | ORAL | Status: DC
  Administered 2016-01-24 – 2016-01-27 (×10): 500 mg via ORAL
  Filled 2016-01-24 (×10): qty 1

## 2016-01-24 NOTE — Progress Notes (Signed)
Subjective: Patient is complaining of pain and swelling of the left knee. The pain is worse when she try to move it. No history of trauma. Objective: Vital signs in last 24 hours: Temp:  [97.8 F (36.6 C)-99.4 F (37.4 C)] 97.8 F (36.6 C) (05/27 0514) Pulse Rate:  [75-86] 75 (05/27 0514) Resp:  [16-18] 18 (05/27 0514) BP: (132-152)/(71-77) 146/77 mmHg (05/27 0514) SpO2:  [97 %] 97 % (05/27 0514) Weight change:  Last BM Date: 01/19/16  Intake/Output from previous day: 05/26 0701 - 05/27 0700 In: 5122.5 [P.O.:720; I.V.:4102.5; IV Piggyback:300] Out: 850 [Urine:850]  PHYSICAL EXAM General appearance: alert and slowed mentation Resp: clear to auscultation bilaterally Cardio: S1, S2 normal GI: soft and lax, no area of tenderness Extremities: hot, tender and swollen left knee  Lab Results:  Results for orders placed or performed during the hospital encounter of 01/20/16 (from the past 48 hour(s))  Glucose, capillary     Status: None   Collection Time: 01/22/16 12:47 PM  Result Value Ref Range   Glucose-Capillary 72 65 - 99 mg/dL  Glucose, capillary     Status: Abnormal   Collection Time: 01/22/16  3:52 PM  Result Value Ref Range   Glucose-Capillary 115 (H) 65 - 99 mg/dL  Comprehensive metabolic panel     Status: Abnormal   Collection Time: 01/23/16  5:57 AM  Result Value Ref Range   Sodium 139 135 - 145 mmol/L   Potassium 4.3 3.5 - 5.1 mmol/L   Chloride 113 (H) 101 - 111 mmol/L   CO2 21 (L) 22 - 32 mmol/L   Glucose, Bld 87 65 - 99 mg/dL   BUN 37 (H) 6 - 20 mg/dL   Creatinine, Ser 1.11 (H) 0.44 - 1.00 mg/dL   Calcium 7.5 (L) 8.9 - 10.3 mg/dL   Total Protein 5.6 (L) 6.5 - 8.1 g/dL   Albumin 2.3 (L) 3.5 - 5.0 g/dL   AST 47 (H) 15 - 41 U/L   ALT 100 (H) 14 - 54 U/L   Alkaline Phosphatase 79 38 - 126 U/L   Total Bilirubin 1.0 0.3 - 1.2 mg/dL   GFR calc non Af Amer 42 (L) >60 mL/min   GFR calc Af Amer 48 (L) >60 mL/min    Comment: (NOTE) The eGFR has been calculated  using the CKD EPI equation. This calculation has not been validated in all clinical situations. eGFR's persistently <60 mL/min signify possible Chronic Kidney Disease.    Anion gap 5 5 - 15  CBC     Status: Abnormal   Collection Time: 01/23/16  5:57 AM  Result Value Ref Range   WBC 20.2 (H) 4.0 - 10.5 K/uL   RBC 3.13 (L) 3.87 - 5.11 MIL/uL   Hemoglobin 8.9 (L) 12.0 - 15.0 g/dL   HCT 26.6 (L) 36.0 - 46.0 %   MCV 85.0 78.0 - 100.0 fL   MCH 28.4 26.0 - 34.0 pg   MCHC 33.5 30.0 - 36.0 g/dL   RDW 15.7 (H) 11.5 - 15.5 %   Platelets 116 (L) 150 - 400 K/uL    Comment: SPECIMEN CHECKED FOR CLOTS PLATELET COUNT CONFIRMED BY SMEAR   Amylase     Status: None   Collection Time: 01/23/16  5:57 AM  Result Value Ref Range   Amylase 39 28 - 100 U/L  Protime-INR     Status: None   Collection Time: 01/23/16  5:57 AM  Result Value Ref Range   Prothrombin Time 14.9 11.6 - 15.2  seconds   INR 1.15 0.00 - 1.49  CBC     Status: Abnormal (Preliminary result)   Collection Time: 01/24/16  5:55 AM  Result Value Ref Range   WBC 14.2 (H) 4.0 - 10.5 K/uL   RBC 3.21 (L) 3.87 - 5.11 MIL/uL   Hemoglobin 9.1 (L) 12.0 - 15.0 g/dL   HCT 27.5 (L) 36.0 - 46.0 %   MCV 85.7 78.0 - 100.0 fL   MCH 28.3 26.0 - 34.0 pg   MCHC 33.1 30.0 - 36.0 g/dL   RDW 15.7 (H) 11.5 - 15.5 %   Platelets PENDING 150 - 400 K/uL    ABGS No results for input(s): PHART, PO2ART, TCO2, HCO3 in the last 72 hours.  Invalid input(s): PCO2 CULTURES No results found for this or any previous visit (from the past 240 hour(s)). Studies/Results: US Abdomen Limited  01/23/2016  CLINICAL DATA:  Status post retrieval of common bile duct stones by ERCP 01/22/2016, presenting for follow-up. EXAM: US ABDOMEN LIMITED - RIGHT UPPER QUADRANT COMPARISON:  01/20/2016 CT abdomen/ pelvis. 01/22/2016 fluoroscopic ERCP images. FINDINGS: Gallbladder: Gallbladder distention has significantly decreased since 01/20/2016 CT study. Mild diffuse gallbladder wall  thickening. Layering sludge in the gallbladder. No shadowing gallstones are demonstrated in the gallbladder. No pericholecystic fluid or sonographic Murphy's sign. Common bile duct: Diameter: 3 mm Liver: Liver parenchymal echogenicity and echotexture are within normal limits. Scattered pneumobilia is present within the minimally prominent intrahepatic bile ducts, which appear decreased in caliber. No liver mass or definite liver surface irregularity demonstrated. IMPRESSION: 1. Normal common bile duct diameter 3 mm, significantly decreased. Expected post ERCP pneumobilia within the minimally prominent intrahepatic bile ducts, which have decreased in caliber since 01/20/2016. 2. Decreased gallbladder distention. Layering sludge in the gallbladder. No shadowing gallstones demonstrated in the gallbladder. Nonspecific mild diffuse gallbladder wall thickening, probably due to the resolving biliary obstruction. No pericholecystic fluid or sonographic Murphy's sign. 3. Normal liver. Electronically Signed   By: Ilona Sorrel M.D.   On: 01/23/2016 15:31   Dg Ercp With Sphincterotomy  01/22/2016  CLINICAL DATA:  Obstructive jaundice EXAM: ERCP TECHNIQUE: Multiple spot images obtained with the fluoroscopic device and submitted for interpretation post-procedure. FLUOROSCOPY TIME:  2 minutes 36 second COMPARISON:  CT 01/20/2016 FINDINGS: A series of fluoroscopic clips document endoscopic cannulation and opacification of the CBD. CBD dilated. Mobile filling defects in the proximal and mid CBD. Incomplete opacification of the central intrahepatic bile ducts, which appear mildly distended centrally. Endoscopic basket with stone capture. IMPRESSION: 1. Filling defects in dilated CBD, with endoscopic intervention These images were submitted for radiologic interpretation only. Please see the procedural report for the amount of contrast and the fluoroscopy time utilized. Electronically Signed   By: Lucrezia Europe M.D.   On: 01/22/2016  15:52    Medications: I have reviewed the patient's current medications.  Assesment:   Principal Problem:   Biliary obstruction Active Problems:   Essential hypertension   Hypothyroidism   Hypotension   Jaundice   Abnormal LFTs   Hypovolemia   Abdominal pain   Nausea & vomiting   DNR (do not resuscitate) osteoarthritis exacerbation    Plan:  Medications reviewed Xray of the left knee Will give a dose of solumedrol Continue pain medications   LOS: 4 days   Odyssey Vasbinder 01/24/2016, 8:39 AM

## 2016-01-24 NOTE — Progress Notes (Signed)
Patient feeling better today with less pain. Accompanied by daughter. Finishing up a regular breakfast tray when I came to visit this morning  Vital signs in last 24 hours: Temp:  [97.8 F (36.6 C)-99.4 F (37.4 C)] 97.8 F (36.6 C) (05/27 0514) Pulse Rate:  [75-86] 75 (05/27 0514) Resp:  [16-18] 18 (05/27 0514) BP: (132-152)/(71-77) 146/77 mmHg (05/27 0514) SpO2:  [97 %] 97 % (05/27 0514) Last BM Date: 01/19/16 General:   Awake and alert. Slow to respond. Appears somewhat confused.  Abdomen:  Nondistended. Positive bowel sounds. Minimal epigastric tenderness. Extremities:  Without clubbing or edema.    Intake/Output from previous day: 05/26 0701 - 05/27 0700 In: 5122.5 [P.O.:720; I.V.:4102.5; IV Piggyback:300] Out: 850 [Urine:850] Intake/Output this shift:    Lab Results:  Recent Labs  01/22/16 0419 01/23/16 0557 01/24/16 0555  WBC 29.3* 20.2* 14.2*  HGB 9.7* 8.9* 9.1*  HCT 27.7* 26.6* 27.5*  PLT 128* 116* PENDING   BMET  Recent Labs  01/22/16 0419 01/23/16 0557  NA 137 139  K 4.3 4.3  CL 108 113*  CO2 21* 21*  GLUCOSE 71 87  BUN 40* 37*  CREATININE 1.68* 1.11*  CALCIUM 7.4* 7.5*   LFT  Recent Labs  01/23/16 0557  PROT 5.6*  ALBUMIN 2.3*  AST 47*  ALT 100*  ALKPHOS 79  BILITOT 1.0   PT/INR  Recent Labs  01/22/16 0419 01/23/16 0557  LABPROT 18.6* 14.9  INR 1.55* 1.15   Hepatitis Panel No results for input(s): HEPBSAG, HCVAB, HEPAIGM, HEPBIGM in the last 72 hours. C-Diff No results for input(s): CDIFFTOX in the last 72 hours.  Studies/Results: US Abdomen Limited  01/23/2016  CLINICAL DATA:  Status post retrieval of common bile duct stones by ERCP 01/22/2016, presenting for follow-up. EXAM: US ABDOMEN LIMITED - RIGHT UPPER QUADRANT COMPARISON:  01/20/2016 CT abdomen/ pelvis. 01/22/2016 fluoroscopic ERCP images. FINDINGS: Gallbladder: Gallbladder distention has significantly decreased since 01/20/2016 CT study. Mild diffuse gallbladder wall  thickening. Layering sludge in the gallbladder. No shadowing gallstones are demonstrated in the gallbladder. No pericholecystic fluid or sonographic Murphy's sign. Common bile duct: Diameter: 3 mm Liver: Liver parenchymal echogenicity and echotexture are within normal limits. Scattered pneumobilia is present within the minimally prominent intrahepatic bile ducts, which appear decreased in caliber. No liver mass or definite liver surface irregularity demonstrated. IMPRESSION: 1. Normal common bile duct diameter 3 mm, significantly decreased. Expected post ERCP pneumobilia within the minimally prominent intrahepatic bile ducts, which have decreased in caliber since 01/20/2016. 2. Decreased gallbladder distention. Layering sludge in the gallbladder. No shadowing gallstones demonstrated in the gallbladder. Nonspecific mild diffuse gallbladder wall thickening, probably due to the resolving biliary obstruction. No pericholecystic fluid or sonographic Murphy's sign. 3. Normal liver. Electronically Signed   By: Ilona Sorrel M.D.   On: 01/23/2016 15:31   Dg Ercp With Sphincterotomy  01/22/2016  CLINICAL DATA:  Obstructive jaundice EXAM: ERCP TECHNIQUE: Multiple spot images obtained with the fluoroscopic device and submitted for interpretation post-procedure. FLUOROSCOPY TIME:  2 minutes 36 second COMPARISON:  CT 01/20/2016 FINDINGS: A series of fluoroscopic clips document endoscopic cannulation and opacification of the CBD. CBD dilated. Mobile filling defects in the proximal and mid CBD. Incomplete opacification of the central intrahepatic bile ducts, which appear mildly distended centrally. Endoscopic basket with stone capture. IMPRESSION: 1. Filling defects in dilated CBD, with endoscopic intervention These images were submitted for radiologic interpretation only. Please see the procedural report for the amount of contrast and the fluoroscopy time utilized.  Electronically Signed   By: Lucrezia Europe M.D.   On: 01/22/2016  15:52   Impression:  Choledocholithiasis status post ERCP/biliary decompression stone removal. Notable decrease in gallbladder distention on follow-up ultrasound. Clinically doing pretty good this morning. No LFTs this morning. Leukocytosis improved; hemoglobin stable.  She is tolerating her diet. Pain improved.   Recommendations: Transition antibiotics to the oral route 5 days.  As long as she continues to improve, no further GI intervention warranted. Hopefully, we can avoid cholecystectomy.

## 2016-01-25 NOTE — Plan of Care (Signed)
Problem: Bowel/Gastric: Goal: Will not experience complications related to bowel motility Outcome: Not Progressing PT's last reported BM was 01/19/2016.

## 2016-01-25 NOTE — Progress Notes (Signed)
Subjective: Patient feels better. The knee swelling  and pain is better. However, she is not unable to ambulate. The daughter claims she can't take her home this condition and wants to get physical therapy. Objective: Vital signs in last 24 hours: Temp:  [98.6 F (37 C)-99.4 F (37.4 C)] 98.6 F (37 C) (05/28 0530) Pulse Rate:  [82-109] 82 (05/28 0530) Resp:  [18-20] 20 (05/28 0530) BP: (142-153)/(77-83) 142/77 mmHg (05/28 0530) SpO2:  [92 %-94 %] 93 % (05/28 0530) Weight change:  Last BM Date: 01/19/16  Intake/Output from previous day: 05/27 0701 - 05/28 0700 In: 600 [P.O.:600] Out: 1000 [Urine:1000]  PHYSICAL EXAM General appearance: alert and slowed mentation Resp: clear to auscultation bilaterally Cardio: S1, S2 normal GI: soft and lax, no area of tenderness Extremities: hot, tender and swollen left knee  Lab Results:  Results for orders placed or performed during the hospital encounter of 01/20/16 (from the past 48 hour(s))  CBC     Status: Abnormal   Collection Time: 01/24/16  5:55 AM  Result Value Ref Range   WBC 14.2 (H) 4.0 - 10.5 K/uL   RBC 3.21 (L) 3.87 - 5.11 MIL/uL   Hemoglobin 9.1 (L) 12.0 - 15.0 g/dL   HCT 27.5 (L) 36.0 - 46.0 %   MCV 85.7 78.0 - 100.0 fL   MCH 28.3 26.0 - 34.0 pg   MCHC 33.1 30.0 - 36.0 g/dL   RDW 15.7 (H) 11.5 - 15.5 %   Platelets 112 (L) 150 - 400 K/uL    Comment: SPECIMEN CHECKED FOR CLOTS PLATELET COUNT CONFIRMED BY SMEAR     ABGS No results for input(s): PHART, PO2ART, TCO2, HCO3 in the last 72 hours.  Invalid input(s): PCO2 CULTURES No results found for this or any previous visit (from the past 240 hour(s)). Studies/Results: Dg Knee 1-2 Views Left  01/24/2016  CLINICAL DATA:  Left knee Pain with movement since being admitted, pt had GB surgery this week, EXAM: LEFT KNEE - 1-2 VIEW COMPARISON:  None. FINDINGS: Advanced tricompartmental degenerative changes with significant loss of articular cartilage in medial and lateral  compartments, marginal spurring about all 3 compartments, and remodeling of the femoral condyles and medial tibial plateau. No acute fracture or dislocation. Small effusion in the suprapatellar bursa. Patchy popliteal arterial calcifications. IMPRESSION: 1. Tricompartmental degenerative changes as above with small effusion. 2. Negative for acute fracture or dislocation. Electronically Signed   By: Lucrezia Europe M.D.   On: 01/24/2016 10:32   US Abdomen Limited  01/23/2016  CLINICAL DATA:  Status post retrieval of common bile duct stones by ERCP 01/22/2016, presenting for follow-up. EXAM: US ABDOMEN LIMITED - RIGHT UPPER QUADRANT COMPARISON:  01/20/2016 CT abdomen/ pelvis. 01/22/2016 fluoroscopic ERCP images. FINDINGS: Gallbladder: Gallbladder distention has significantly decreased since 01/20/2016 CT study. Mild diffuse gallbladder wall thickening. Layering sludge in the gallbladder. No shadowing gallstones are demonstrated in the gallbladder. No pericholecystic fluid or sonographic Murphy's sign. Common bile duct: Diameter: 3 mm Liver: Liver parenchymal echogenicity and echotexture are within normal limits. Scattered pneumobilia is present within the minimally prominent intrahepatic bile ducts, which appear decreased in caliber. No liver mass or definite liver surface irregularity demonstrated. IMPRESSION: 1. Normal common bile duct diameter 3 mm, significantly decreased. Expected post ERCP pneumobilia within the minimally prominent intrahepatic bile ducts, which have decreased in caliber since 01/20/2016. 2. Decreased gallbladder distention. Layering sludge in the gallbladder. No shadowing gallstones demonstrated in the gallbladder. Nonspecific mild diffuse gallbladder wall thickening, probably due to the  resolving biliary obstruction. No pericholecystic fluid or sonographic Murphy's sign. 3. Normal liver. Electronically Signed   By: Ilona Sorrel M.D.   On: 01/23/2016 15:31    Medications: I have reviewed the  patient's current medications.  Assesment:   Principal Problem:   Biliary obstruction Active Problems:   Essential hypertension   Hypothyroidism   Hypotension   Jaundice   Abnormal LFTs   Hypovolemia   Abdominal pain   Nausea & vomiting   DNR (do not resuscitate) osteoarthritis exacerbation    Plan:  Medications reviewed Physical therapy evaluation and treatment Continue pain medications   LOS: 5 days   Clary Meeker 01/25/2016, 8:34 AM

## 2016-01-26 MED ORDER — POLYETHYLENE GLYCOL 3350 17 G PO PACK
17.0000 g | PACK | Freq: Every day | ORAL | Status: DC | PRN
  Administered 2016-01-26 – 2016-01-27 (×2): 17 g via ORAL
  Filled 2016-01-26 (×2): qty 1

## 2016-01-26 MED ORDER — ENOXAPARIN SODIUM 40 MG/0.4ML ~~LOC~~ SOLN
40.0000 mg | SUBCUTANEOUS | Status: DC
  Administered 2016-01-26: 40 mg via SUBCUTANEOUS
  Filled 2016-01-26: qty 0.4

## 2016-01-26 NOTE — NC FL2 (Signed)
[place] LEVEL OF CARE SCREENING TOOL     IDENTIFICATION  Patient Name: Jo Hardin Birthdate: 1922/06/17 Sex: female Admission Date (Current Location): 01/20/2016  [place] and [place] Number:  [place] [id number] [place] and Address:  [facility] [address], [place]      Provider Number: [id number]  Attending Physician Name and Address:  [name], MD  Relative Name and Phone Number:       Current Level of Care: Hospital Recommended Level of Care: [place] Prior Approval Number:    Date Approved/Denied:   PASRR Number: [id number]  Discharge Plan: SNF    Current Diagnoses: Patient Active Problem List   Diagnosis Date Noted  . Biliary obstruction 01/20/2016  . Jaundice 01/20/2016  . Abnormal LFTs 01/20/2016  . Hypovolemia 01/20/2016  . Abdominal pain 01/20/2016  . Nausea & vomiting 01/20/2016  . DNR (do not resuscitate) 01/20/2016  . Acute encephalopathy 05/19/2015  . Sphenoid sinusitis 05/19/2015  . Anemia, iron deficiency 05/19/2015  . Hypotension 05/18/2015  . Hypokalemia 05/07/2015  . Iron deficiency anemia 05/07/2015  . Essential hypertension 05/03/2015  . Hypothyroidism 05/03/2015  . GERD (gastroesophageal reflux disease) 05/03/2015  . Arthritis 05/03/2015  . Hyperlipidemia 05/03/2015    Orientation RESPIRATION BLADDER Height & Weight     Self, Situation, Place  Normal Indwelling catheter Weight: 135 lb (61.236 kg) Height:  5\' 7"  (170.2 cm)  BEHAVIORAL SYMPTOMS/MOOD NEUROLOGICAL BOWEL NUTRITION STATUS  Other (Comment) (n/a)  (n/a) Incontinent Diet (heart healthy)  AMBULATORY STATUS COMMUNICATION OF NEEDS Skin   Total Care Verbally Normal                       Personal Care Assistance Level of Assistance  Bathing, Feeding, Dressing Bathing Assistance: Maximum assistance Feeding assistance: Limited assistance Dressing Assistance: Maximum assistance     Functional  Limitations Info  Sight, Hearing, Speech Sight Info: Adequate Hearing Info: Adequate Speech Info: Adequate    SPECIAL CARE FACTORS FREQUENCY  PT (By licensed PT)     PT Frequency: 5              Contractures      Additional Factors Info  Code Status, Allergies Code Status Info: DNR Allergies Info: Allergies: Nsaids. Adverse Reactions: Codeine           Current Medications (01/26/2016):  This is the current hospital active medication list Current Facility-Administered Medications  Medication Dose Route Frequency Provider Last Rate Last Dose  . acetaminophen (TYLENOL) tablet 650 mg  650 mg Oral Q6H PRN Roney Jaffe, MD   650 mg at 01/22/16 0242   Or  . acetaminophen (TYLENOL) suppository 650 mg  650 mg Rectal Q6H PRN Roney Jaffe, MD      . ciprofloxacin (CIPRO) tablet 500 mg  500 mg Oral Q breakfast [name], MD   500 mg at 01/26/16 1042  . docusate sodium (COLACE) capsule 100 mg  100 mg Oral q morning - 10a Roney Jaffe, MD   100 mg at 01/26/16 1042  . levothyroxine (SYNTHROID, LEVOTHROID) tablet 125 mcg  125 mcg Oral QAC breakfast Roney Jaffe, MD   125 mcg at 01/26/16 1043  . metroNIDAZOLE (FLAGYL) tablet 500 mg  500 mg Oral Q8H [name], MD   500 mg at 01/26/16 [id number]  . ondansetron (ZOFRAN) tablet 4 mg  4 mg Oral Q6H PRN Roney Jaffe, MD       Or  . ondansetron [facility]) injection 4  mg  4 mg Intravenous Q6H PRN Roney Jaffe, MD      . pantoprazole (PROTONIX) EC tablet 40 mg  40 mg Oral Daily Roney Jaffe, MD   40 mg at 01/26/16 1042  . potassium chloride (K-DUR,KLOR-CON) CR tablet 10 mEq  10 mEq Oral BID [name], MD   10 mEq at 01/26/16 1043  . traMADol (ULTRAM) tablet 50 mg  50 mg Oral Q6H PRN [name], MD   50 mg at 01/25/16 1805     Discharge Medications: Please see discharge summary for a list of discharge medications.  Relevant Imaging Results:  Relevant Lab Results:   Additional Information SS#: [id number]  [name], [place]

## 2016-01-26 NOTE — Progress Notes (Signed)
Subjective: Patient is resting. Her knee pain is improving. Patient is not able to ambulate. The daughter is requesting for placement to nursing home for physical therapy. Objective: Vital signs in last 24 hours: Temp:  [98.5 F (36.9 C)-98.8 F (37.1 C)] 98.8 F (37.1 C) (05/29 0504) Pulse Rate:  [84-90] 84 (05/29 0504) Resp:  [20] 20 (05/29 0504) BP: (151-170)/(82-93) 170/93 mmHg (05/29 0504) SpO2:  [97 %-98 %] 98 % (05/29 0504) Weight change:  Last BM Date: 01/19/16  Intake/Output from previous day: 05/28 0701 - 05/29 0700 In: 240 [P.O.:240] Out: 1100 [Urine:1100]  PHYSICAL EXAM General appearance: alert and slowed mentation Resp: clear to auscultation bilaterally Cardio: S1, S2 normal GI: soft and lax, no area of tenderness Extremities: hot, tender and swollen left knee  Lab Results:  No results found for this or any previous visit (from the past 48 hour(s)).  ABGS No results for input(s): PHART, PO2ART, TCO2, HCO3 in the last 72 hours.  Invalid input(s): PCO2 CULTURES No results found for this or any previous visit (from the past 240 hour(s)). Studies/Results: Dg Knee 1-2 Views Left  01/24/2016  CLINICAL DATA:  Left knee Pain with movement since being admitted, pt had GB surgery this week, EXAM: LEFT KNEE - 1-2 VIEW COMPARISON:  None. FINDINGS: Advanced tricompartmental degenerative changes with significant loss of articular cartilage in medial and lateral compartments, marginal spurring about all 3 compartments, and remodeling of the femoral condyles and medial tibial plateau. No acute fracture or dislocation. Small effusion in the suprapatellar bursa. Patchy popliteal arterial calcifications. IMPRESSION: 1. Tricompartmental degenerative changes as above with small effusion. 2. Negative for acute fracture or dislocation. Electronically Signed   By: Lucrezia Europe M.D.   On: 01/24/2016 10:32    Medications: I have reviewed the patient's current medications.  Assesment:    Principal Problem:   Biliary obstruction Active Problems:   Essential hypertension   Hypothyroidism   Hypotension   Jaundice   Abnormal LFTs   Hypovolemia   Abdominal pain   Nausea & vomiting   DNR (do not resuscitate) osteoarthritis exacerbation    Plan:  Medications reviewed Physical therapy evaluation and treatment Social worker evaluation for placement D/C Iv fluid Continue pain medications   LOS: 6 days   Holman Bonsignore 01/26/2016, 8:15 AM

## 2016-01-26 NOTE — Care Management Important Message (Signed)
Important Message  Patient Details  Name: Jo Hardin MRN: [id number] Date of Birth: 03-Aug-1922   Medicare Important Message Given:  Yes    Sherald Barge, RN 01/26/2016, 8:47 AM

## 2016-01-26 NOTE — Plan of Care (Signed)
Problem: Acute Rehab PT Goals(only PT should resolve) Goal: Pt Will Ambulate Will take atleast 3 side steps at EOB, ModA to decrease caregiver burden

## 2016-01-26 NOTE — Evaluation (Signed)
Physical Therapy Evaluation Patient Details Name: Jo Hardin MRN: [id number] DOB: 16-May-1922 Today's Date: 01/26/2016   History of Present Illness  Pt was admitted to [facility] for nausea and vomiting on 5/23. She refused PT evaluation on 5/26.   Clinical Impression  Pt found in her bed with daughter present during evaluation. She was A&Ox person and place and willing to participate in PT eval. Noting significant BLE weakness and pain in LLE. Also noting swelling in B knees and feet. Pt unable to advance BLE to EOB without ModA from PT and ultimately needing MaxA to complete task. Pt able to sit EOB with supervision for 3 min without LOB, and she required MaxA to attempt to stand with elevated surface x2 trials. Due to increasing fatigue, pt requiring MaxA x2 to get back into the bed. AAROM to BLE was performed for improved mobility and encouraged pt and caregiver to perform therex every hour. Pt would benefit from skilled PT while at [facility] to improve her strength and mobility as well as skilled PT services at discharge to decreased caregiver burden once returning home.    Follow Up Recommendations SNF    Equipment Recommendations    None   Recommendations for Other Services   None    Precautions / Restrictions Restrictions Weight Bearing Restrictions: No      Mobility  Bed Mobility Overal bed mobility: Needs Assistance;+2 for physical assistance Bed Mobility: Supine to Sit;Sit to Supine     Supine to sit: Max assist;HOB elevated Sit to supine: +2 for physical assistance      Transfers Overall transfer level: Needs assistance   Transfers: Sit to/from Stand Sit to Stand: Max assist         General transfer comment: X2 attempts to come to stand, however pt unable to completely come to stand secondary to pain and fatigue  Ambulation/Gait                Stairs            Wheelchair Mobility    Modified Rankin (Stroke Patients Only)       Balance Overall  balance assessment: Needs assistance (Good sitting balance with UE support)                                           Pertinent Vitals/Pain Pain Assessment: 0-10 Pain Score:  (Pt unable to rate, but noting pain along Lt knee and B ankles)    Home Living Family/patient expects to be discharged to:: Private residence Living Arrangements: Children Available Help at Discharge: Family;Available 24 hours/day Type of Home: House       Home Layout: One level Home Equipment: Cane - single point;Bedside commode;Walker - 2 wheels      Prior Function Level of Independence: Needs assistance   Gait / Transfers Assistance Needed: Daughter reports pt is normally able to transfer, get in/out of bed and ambulate short household distance with ModI using RW  ADL's / Homemaking Assistance Needed: needs assist with bathing and dressing        Hand Dominance        Extremity/Trunk Assessment   Upper Extremity Assessment: Generalized weakness           Lower Extremity Assessment: Generalized weakness         Communication   Communication: No difficulties  Cognition Arousal/Alertness: Awake/alert Behavior During Therapy: [facility] for  tasks assessed/performed Overall Cognitive Status: Impaired/Different from baseline (daughter reporting she is more confused than usual however she does have history of cognitive impairment) Area of Impairment: Orientation Orientation Level: Person;Place                  General Comments      Exercises General Exercises - Lower Extremity Ankle Circles/Pumps: Supine;AROM;Both;5 reps Heel Slides: AAROM;Supine;Both;5 reps      Assessment/Plan    PT Assessment Patient needs continued PT services  PT Diagnosis Difficulty walking;Generalized weakness;Acute pain   PT Problem List Decreased strength;Decreased activity tolerance;Decreased balance;Decreased mobility;Pain  PT Treatment Interventions Gait training;Functional mobility  training;Therapeutic activities;Therapeutic exercise;Manual techniques;Patient/family education;Balance training;Modalities   PT Goals (Current goals can be found in the Care Plan section) Acute Rehab PT Goals Patient Stated Goal: improve strength and mobility PT Goal Formulation: With patient/family Time For Goal Achievement: 02/09/16 Potential to Achieve Goals: Good    Frequency Min 3X/week   Barriers to discharge Other (comment) (caregiver unable to provided needed assistance at this time)      Co-evaluation               End of Session Equipment Utilized During Treatment: Gait belt Activity Tolerance: Patient limited by pain Patient left: in bed;with family/visitor present;with call bell/phone within reach Nurse Communication: Mobility status         Time: [id number] PT Time Calculation (min) (ACUTE ONLY): 33 min   Charges:   PT Evaluation $PT Eval Moderate Complexity: 1 Procedure PT Treatments $Therapeutic Exercise: 8-22 mins $Therapeutic Activity: 8-22 mins   PT G Codes:       11:37 AM,20-Feb-2016 Elly Modena PT, DPT [facility] [phone]

## 2016-01-26 NOTE — Clinical Social Work Placement (Signed)
   CLINICAL SOCIAL WORK PLACEMENT  NOTE  Date:  01/26/2016  Patient Details  Name: Jo Hardin MRN: [id number] Date of Birth: 1922-06-08  Clinical Social Work is seeking post-discharge placement for this patient at the [place] level of care (*CSW will initial, date and re-position this form in  chart as items are completed):  Yes   Patient/family provided with [place] Work Department's list of facilities offering this level of care within the geographic area requested by the patient (or if unable, by the patient's family).  Yes   Patient/family informed of their freedom to choose among providers that offer the needed level of care, that participate in Medicare, Medicaid or managed care program needed by the patient, have an available bed and are willing to accept the patient.  Yes   Patient/family informed of [facility]'s ownership interest in [facility] and [facility], as well as of the fact that they are under no obligation to receive care at these facilities.  PASRR submitted to EDS on       PASRR number received on       Existing PASRR number confirmed on 01/26/16     FL2 transmitted to all facilities in geographic area requested by pt/family on 01/26/16     FL2 transmitted to all facilities within larger geographic area on       Patient informed that his/her managed care company has contracts with or will negotiate with certain facilities, including the following:            Patient/family informed of bed offers received.  Patient chooses bed at       Physician recommends and patient chooses bed at      Patient to be transferred to   on  .  Patient to be transferred to facility by       Patient family notified on   of transfer.  Name of family member notified:        PHYSICIAN       Additional Comment:    _______________________________________________ [name], Ruth 01/26/2016, 1:17  PM [phone]

## 2016-01-26 NOTE — Clinical Social Work Note (Addendum)
Clinical Social Work Assessment  Patient Details  Name: Jo Hardin MRN: [id number] Date of Birth: 11/17/1921  Date of referral:  01/26/16               Reason for consult:  Discharge Planning                Permission sought to share information with:    Permission granted to share information::     Name::        Agency::     Relationship::     Contact Information:     Housing/Transportation Living arrangements for the past 2 months:  Single Family Home Source of Information:  Adult Children Patient Interpreter Needed:  None Criminal Activity/Legal Involvement Pertinent to Current Situation/Hospitalization:  No - Comment as needed Significant Relationships:  Adult Children Lives with:  Adult Children Do you feel safe going back to the place where you live?  Yes Need for family participation in patient care:  Yes (Comment)  Care giving concerns:  Pt's daughter is unable to care for pt in current condition.    Social Worker assessment / plan:  CSW attempted to meet with pt at bedside, but pt had multiple visitors. CSW was able to speak with pt's daughter, Jo Hardin in hallway and will discuss d/c planning further with pt tomorrow. Jo Hardin reports that pt lives with her and she is with pt around the clock. At baseline, pt ambulates with walker. Pt's daughter feels pt is very deconditioned and PT evaluation recommends SNF. Jo Hardin is aware pt is approaching d/c and agrees with recommendation. Aware of Medicare coverage/criteria. Jo Hardin reports pt was at [facility] last year and this was a good experience. She requests for pt to return there if possible.   Employment status:  Retired Forensic scientist:  Medicare PT Recommendations:  [place] / Referral to community resources:  [place]  Patient/Family's Response to care:  Pt's daughter is very hopeful pt will be able to return home following short term rehab.   Patient/Family's Understanding of and  Emotional Response to Diagnosis, Current Treatment, and Prognosis:  Pt's daughter appears to be knowledgeable of pt's health history and admission diagnosis.   Emotional Assessment Appearance:  Appears stated age Attitude/Demeanor/Rapport:  Unable to Assess Affect (typically observed):  Unable to Assess Orientation:    Alcohol / Substance use:  Not Applicable Psych involvement (Current and /or in the community):  No (Comment)  Discharge Needs  Concerns to be addressed:  Discharge Planning Concerns Readmission within the last 30 days:  No Current discharge risk:  None Barriers to Discharge:  Continued Medical Work up   Jo Hardin, [place] 01/26/2016, 1:19 PM [phone]

## 2016-01-27 ENCOUNTER — Inpatient Hospital Stay
Admission: RE | Admit: 2016-01-27 | Discharge: 2019-01-29 | Disposition: E | Payer: Medicare Other | Source: Ambulatory Visit | Attending: Internal Medicine | Admitting: Internal Medicine

## 2016-01-27 DIAGNOSIS — M170 Bilateral primary osteoarthritis of knee: Secondary | ICD-10-CM | POA: Diagnosis not present

## 2016-01-27 DIAGNOSIS — R17 Unspecified jaundice: Secondary | ICD-10-CM | POA: Diagnosis not present

## 2016-01-27 DIAGNOSIS — R509 Fever, unspecified: Secondary | ICD-10-CM | POA: Diagnosis not present

## 2016-01-27 DIAGNOSIS — F0390 Unspecified dementia without behavioral disturbance: Secondary | ICD-10-CM | POA: Diagnosis not present

## 2016-01-27 DIAGNOSIS — K219 Gastro-esophageal reflux disease without esophagitis: Secondary | ICD-10-CM | POA: Diagnosis not present

## 2016-01-27 DIAGNOSIS — E876 Hypokalemia: Secondary | ICD-10-CM | POA: Diagnosis not present

## 2016-01-27 DIAGNOSIS — N390 Urinary tract infection, site not specified: Secondary | ICD-10-CM | POA: Diagnosis not present

## 2016-01-27 DIAGNOSIS — M1990 Unspecified osteoarthritis, unspecified site: Secondary | ICD-10-CM | POA: Diagnosis not present

## 2016-01-27 DIAGNOSIS — I959 Hypotension, unspecified: Secondary | ICD-10-CM | POA: Diagnosis not present

## 2016-01-27 DIAGNOSIS — R278 Other lack of coordination: Secondary | ICD-10-CM | POA: Diagnosis not present

## 2016-01-27 DIAGNOSIS — Z48815 Encounter for surgical aftercare following surgery on the digestive system: Secondary | ICD-10-CM | POA: Diagnosis not present

## 2016-01-27 DIAGNOSIS — D509 Iron deficiency anemia, unspecified: Secondary | ICD-10-CM | POA: Diagnosis not present

## 2016-01-27 DIAGNOSIS — I10 Essential (primary) hypertension: Secondary | ICD-10-CM | POA: Diagnosis not present

## 2016-01-27 DIAGNOSIS — B370 Candidal stomatitis: Secondary | ICD-10-CM | POA: Diagnosis not present

## 2016-01-27 DIAGNOSIS — E871 Hypo-osmolality and hyponatremia: Secondary | ICD-10-CM | POA: Diagnosis not present

## 2016-01-27 DIAGNOSIS — R1310 Dysphagia, unspecified: Secondary | ICD-10-CM | POA: Diagnosis not present

## 2016-01-27 DIAGNOSIS — E785 Hyperlipidemia, unspecified: Secondary | ICD-10-CM | POA: Diagnosis not present

## 2016-01-27 DIAGNOSIS — IMO0002 Reserved for concepts with insufficient information to code with codable children: Principal | ICD-10-CM

## 2016-01-27 DIAGNOSIS — E039 Hypothyroidism, unspecified: Secondary | ICD-10-CM | POA: Diagnosis not present

## 2016-01-27 DIAGNOSIS — R229 Localized swelling, mass and lump, unspecified: Principal | ICD-10-CM

## 2016-01-27 DIAGNOSIS — E559 Vitamin D deficiency, unspecified: Secondary | ICD-10-CM | POA: Diagnosis not present

## 2016-01-27 DIAGNOSIS — R600 Localized edema: Secondary | ICD-10-CM | POA: Diagnosis not present

## 2016-01-27 DIAGNOSIS — R609 Edema, unspecified: Secondary | ICD-10-CM | POA: Diagnosis not present

## 2016-01-27 DIAGNOSIS — R262 Difficulty in walking, not elsewhere classified: Secondary | ICD-10-CM | POA: Diagnosis not present

## 2016-01-27 DIAGNOSIS — R634 Abnormal weight loss: Secondary | ICD-10-CM | POA: Diagnosis not present

## 2016-01-27 DIAGNOSIS — E038 Other specified hypothyroidism: Secondary | ICD-10-CM | POA: Diagnosis not present

## 2016-01-27 DIAGNOSIS — R197 Diarrhea, unspecified: Secondary | ICD-10-CM | POA: Diagnosis not present

## 2016-01-27 DIAGNOSIS — M6281 Muscle weakness (generalized): Secondary | ICD-10-CM | POA: Diagnosis not present

## 2016-01-27 DIAGNOSIS — K831 Obstruction of bile duct: Secondary | ICD-10-CM | POA: Diagnosis not present

## 2016-01-27 MED ORDER — CIPROFLOXACIN HCL 500 MG PO TABS: 500.0000 mg | ORAL_TABLET | Freq: Every day | ORAL | Status: DC

## 2016-01-27 MED ORDER — METRONIDAZOLE 500 MG PO TABS: 500.0000 mg | ORAL_TABLET | Freq: Three times a day (TID) | ORAL | Status: DC

## 2016-01-27 MED ORDER — TRAMADOL HCL 50 MG PO TABS: 50.0000 mg | ORAL_TABLET | Freq: Four times a day (QID) | ORAL | Status: DC | PRN

## 2016-01-27 NOTE — Care Management Note (Signed)
Case Management Note  Patient Details  Name: Jo Hardin MRN: [id number] Date of Birth: 03/18/1922   Expected Discharge Date:                 01/05/2016 Expected Discharge Plan:  [place]  In-House Referral:     Discharge planning Services  CM Consult  Post Acute Care Choice:  NA Choice offered to:  NA  DME Arranged:    DME Agency:     HH Arranged:    [place] Agency:     Status of Service:  Completed, signed off  Medicare Important Message Given:  Yes Date Medicare IM Given:    Medicare IM give by:    Date Additional Medicare IM Given:    Additional Medicare Important Message give by:     If discussed at [place] of Stay Meetings, dates discussed:    Additional Comments: Patient discharging today to [facility]. CSW arranging for transfer to [facility]. No CM needs. Will sign off.  Allysia Ingles, Chauncey [place], RN 12/31/2015, 12:35 PM

## 2016-01-27 NOTE — Progress Notes (Signed)
Subjective: Patient is resting. She is deconditioned and has difficulty too ambulate or perform her ADL. Patient is planned for placement in nursing home.. Objective: Vital signs in last 24 hours: Temp:  [97.9 F (36.6 C)-98.7 F (37.1 C)] 97.9 F (36.6 C) (05/30 0738) Pulse Rate:  [79-96] 79 (05/30 0738) Resp:  [18-20] 18 (05/30 0738) BP: (135-151)/(73-89) 146/73 mmHg (05/30 0738) SpO2:  [92 %-99 %] 99 % (05/30 0738) Weight change:  Last BM Date: 01/20/16  Intake/Output from previous day: 05/29 0701 - 05/30 0700 In: 720 [P.O.:720] Out: 1650 [Urine:1650]  PHYSICAL EXAM General appearance: alert and slowed mentation Resp: clear to auscultation bilaterally Cardio: S1, S2 normal GI: soft and lax, no area of tenderness Extremities: hot, tender and swollen left knee  Lab Results:  No results found for this or any previous visit (from the past 48 hour(s)).  ABGS No results for input(s): PHART, PO2ART, TCO2, HCO3 in the last 72 hours.  Invalid input(s): PCO2 CULTURES No results found for this or any previous visit (from the past 240 hour(s)). Studies/Results: No results found.  Medications: I have reviewed the patient's current medications.  Assesment:   Principal Problem:   Biliary obstruction Active Problems:   Essential hypertension   Hypothyroidism   Hypotension   Jaundice   Abnormal LFTs   Hypovolemia   Abdominal pain   Nausea & vomiting   DNR (do not resuscitate) osteoarthritis exacerbation    Plan:  Medications reviewed Physical therapy evaluation and treatment Waiting for nursing home placement. Continue pain medications   LOS: 7 days   Jo Hardin 01/26/2016, 8:02 AM

## 2016-01-27 NOTE — Progress Notes (Signed)
Pt discharging to [facility] today per Dr. Legrand Rams.  Pt's IV site D/C'd and WDL.  Pt's VSS.  Report called to nurse at [facility].  Received and verbalized understanding.  Pt left floor via hospital bed in stable condition accompanied by nursing staff.

## 2016-01-27 NOTE — Clinical Social Work Placement (Signed)
   CLINICAL SOCIAL WORK PLACEMENT  NOTE  Date:  01/04/2016  Patient Details  Name: Jo Hardin MRN: [id number] Date of Birth: 04/30/1922  Clinical Social Work is seeking post-discharge placement for this patient at the [place] level of care (*CSW will initial, date and re-position this form in  chart as items are completed):  Yes   Patient/family provided with [place] Work Department's list of facilities offering this level of care within the geographic area requested by the patient (or if unable, by the patient's family).  Yes   Patient/family informed of their freedom to choose among providers that offer the needed level of care, that participate in Medicare, Medicaid or managed care program needed by the patient, have an available bed and are willing to accept the patient.  Yes   Patient/family informed of [facility]'s ownership interest in [facility] and [facility], as well as of the fact that they are under no obligation to receive care at these facilities.  PASRR submitted to EDS on       PASRR number received on       Existing PASRR number confirmed on 01/26/16     FL2 transmitted to all facilities in geographic area requested by pt/family on 01/26/16     FL2 transmitted to all facilities within larger geographic area on       Patient informed that his/her managed care company has contracts with or will negotiate with certain facilities, including the following:        Yes   Patient/family informed of bed offers received.  Patient chooses bed at [facility]     Physician recommends and patient chooses bed at      Patient to be transferred to [facility] on 01/05/2016.  Patient to be transferred to facility by staff     Patient family notified on 01/26/2016 of transfer.  Name of family member notified:  [name]- daughter     PHYSICIAN       Additional Comment:     _______________________________________________ [name], [place] 01/14/2016, 8:41 AM [phone]

## 2016-01-27 NOTE — Discharge Summary (Signed)
Physician Discharge Summary  Patient ID: Jo Hardin MRN: [id number] DOB/AGE: 02/29/1922 82 y.o. Primary Care Physician:Travares Nelles, MD Admit date: 01/20/2016 Discharge date: 01/26/2016    Discharge Diagnoses:   Principal Problem:   Biliary obstruction Active Problems:   Essential hypertension   Hypothyroidism   Hypotension   Jaundice   Abnormal LFTs   Hypovolemia   Abdominal pain   Nausea & vomiting   DNR (do not resuscitate)     Medication List    TAKE these medications        albuterol (2.5 MG/3ML) 0.083% nebulizer solution  Commonly known as:  PROVENTIL  Take 2.5 mg by nebulization every 6 (six) hours as needed for wheezing or shortness of breath.     amLODipine 5 MG tablet  Commonly known as:  NORVASC  Take 5 mg by mouth every morning.     ciprofloxacin 500 MG tablet  Commonly known as:  CIPRO  Take 1 tablet (500 mg total) by mouth daily with breakfast.     docusate sodium 100 MG capsule  Commonly known as:  COLACE  Take 100 mg by mouth every morning.     levothyroxine 125 MCG tablet  Commonly known as:  SYNTHROID, LEVOTHROID  Take 125 mcg by mouth daily before breakfast.     losartan 100 MG tablet  Commonly known as:  COZAAR  Take 100 mg by mouth daily.     metroNIDAZOLE 500 MG tablet  Commonly known as:  FLAGYL  Take 1 tablet (500 mg total) by mouth every 8 (eight) hours.     omeprazole 20 MG capsule  Commonly known as:  PRILOSEC  Take 20 mg by mouth daily.     potassium chloride 10 MEQ tablet  Commonly known as:  K-DUR  Take 10 mEq by mouth 2 (two) times daily.     simvastatin 10 MG tablet  Commonly known as:  ZOCOR  Take 10 mg by mouth at bedtime.     traMADol 50 MG tablet  Commonly known as:  ULTRAM  Take 1 tablet (50 mg total) by mouth every 6 (six) hours as needed for moderate pain or severe pain.     Vitamin D (Ergocalciferol) 50000 units Caps capsule  Commonly known as:  DRISDOL  Take 50,000 Units by mouth every Tuesday.         Discharged Condition: improved    Consults: GI  Significant Diagnostic Studies: Dg Knee 1-2 Views Left  02-21-2016  CLINICAL DATA:  Left knee Pain with movement since being admitted, pt had GB surgery this week, EXAM: LEFT KNEE - 1-2 VIEW COMPARISON:  None. FINDINGS: Advanced tricompartmental degenerative changes with significant loss of articular cartilage in medial and lateral compartments, marginal spurring about all 3 compartments, and remodeling of the femoral condyles and medial tibial plateau. No acute fracture or dislocation. Small effusion in the suprapatellar bursa. Patchy popliteal arterial calcifications. IMPRESSION: 1. Tricompartmental degenerative changes as above with small effusion. 2. Negative for acute fracture or dislocation. Electronically Signed   By: Lucrezia Europe M.D.   On: 02-21-16 10:32   Ct Abdomen Pelvis W Contrast  01/20/2016  CLINICAL DATA:  Nausea and vomiting for 1 day EXAM: CT ABDOMEN AND PELVIS WITH CONTRAST TECHNIQUE: Multidetector CT imaging of the abdomen and pelvis was performed using the standard protocol following bolus administration of intravenous contrast. CONTRAST:  186mL ISOVUE-300 IOPAMIDOL (ISOVUE-300) INJECTION 61% COMPARISON:  01/20/2016, 05/06/2015 FINDINGS: Lung bases demonstrate some dependent atelectatic changes without focal confluent infiltrate. The liver demonstrates  a somewhat mottled enhancement pattern in the midportion of the right lobe of the liver likely related to the underlying biliary ductal dilatation and some change in the degree of per fusion. Gallbladder is well distended. The changes seen on recent ultrasound are less well appreciated on the current exam. Common bile duct is dilated to 17 mm. This extends inferiorly to the level of the ampulla. A a somewhat rounded filling defect is noted. This may represent a poorly calcified stone although the possibility of underlying mass lesion would deserve consideration as well. No  significant pancreatic ductal dilatation is seen. Spleen, adrenal glands, pancreas and kidneys are otherwise within normal limits. The bladder is well distended. The uterus again show some calcific changes consistent with uterine fibroids. Central decreased attenuation is noted which may also be related to underlying fibroid disease. No pelvic mass lesion or sidewall adenopathy is noted. Mild aortoiliac calcifications are seen without aneurysmal dilatation. The osseous structures show degenerative change of the lumbar spine. No acute compression deformity is noted. IMPRESSION: Significant biliary ductal dilatation with dilatation of the gallbladder. A rounded soft tissue density is noted distally best seen on the coronal imaging. A neoplasm would be difficult to exclude. ERCP is recommended for further evaluation as well as drainage. Changes in the degree of perfusion of the liver likely related to the biliary ductal dilatation. There are changes suggestive of some thrombosis of some of the right portal venous branches best seen on image number 14 of series 2. Chronic changes as described. Electronically Signed   By: Inez Catalina M.D.   On: 01/20/2016 18:29   US Abdomen Limited  01/23/2016  CLINICAL DATA:  Status post retrieval of common bile duct stones by ERCP 01/22/2016, presenting for follow-up. EXAM: US ABDOMEN LIMITED - RIGHT UPPER QUADRANT COMPARISON:  01/20/2016 CT abdomen/ pelvis. 01/22/2016 fluoroscopic ERCP images. FINDINGS: Gallbladder: Gallbladder distention has significantly decreased since 01/20/2016 CT study. Mild diffuse gallbladder wall thickening. Layering sludge in the gallbladder. No shadowing gallstones are demonstrated in the gallbladder. No pericholecystic fluid or sonographic Murphy's sign. Common bile duct: Diameter: 3 mm Liver: Liver parenchymal echogenicity and echotexture are within normal limits. Scattered pneumobilia is present within the minimally prominent intrahepatic bile ducts,  which appear decreased in caliber. No liver mass or definite liver surface irregularity demonstrated. IMPRESSION: 1. Normal common bile duct diameter 3 mm, significantly decreased. Expected post ERCP pneumobilia within the minimally prominent intrahepatic bile ducts, which have decreased in caliber since 01/20/2016. 2. Decreased gallbladder distention. Layering sludge in the gallbladder. No shadowing gallstones demonstrated in the gallbladder. Nonspecific mild diffuse gallbladder wall thickening, probably due to the resolving biliary obstruction. No pericholecystic fluid or sonographic Murphy's sign. 3. Normal liver. Electronically Signed   By: Ilona Sorrel M.D.   On: 01/23/2016 15:31   Dg Ercp With Sphincterotomy  01/22/2016  CLINICAL DATA:  Obstructive jaundice EXAM: ERCP TECHNIQUE: Multiple spot images obtained with the fluoroscopic device and submitted for interpretation post-procedure. FLUOROSCOPY TIME:  2 minutes 36 second COMPARISON:  CT 01/20/2016 FINDINGS: A series of fluoroscopic clips document endoscopic cannulation and opacification of the CBD. CBD dilated. Mobile filling defects in the proximal and mid CBD. Incomplete opacification of the central intrahepatic bile ducts, which appear mildly distended centrally. Endoscopic basket with stone capture. IMPRESSION: 1. Filling defects in dilated CBD, with endoscopic intervention These images were submitted for radiologic interpretation only. Please see the procedural report for the amount of contrast and the fluoroscopy time utilized. Electronically Signed   By:  Lucrezia Europe M.D.   On: 01/22/2016 15:52   US Abdomen Limited Ruq  01/20/2016  CLINICAL DATA:  Nausea and vomiting since this morning. EXAM: US ABDOMEN LIMITED - RIGHT UPPER QUADRANT COMPARISON:  CT 05/03/2015 and MRI 05/06/2015 FINDINGS: Gallbladder: Hydropic gallbladder measuring 11.2 cm in length. Moderate gallbladder sludge with several small stones the largest measuring 3 mm. There is wall  thickening measuring up to 9 mm with pericholecystic fluid. Positive sonographic Murphy's sign. Common bile duct: Diameter: 18 mm without evidence of choledocholithiasis. Liver: Center intrahepatic duct dilatation. IMPRESSION: Dilated gallbladder with constellation of findings which may be due to acute cholecystitis. Moderate dilatation of the common bile duct without evidence of ductal stones. Intrahepatic ductal dilatation. Further evaluation with cross-sectional imaging may be helpful to exclude a more distal obstructing process at the level of the head of the pancreas/ampulla. Electronically Signed   By: Marin Olp M.D.   On: 01/20/2016 18:01    Lab Results: Basic Metabolic Panel: No results for input(s): NA, K, CL, CO2, GLUCOSE, BUN, CREATININE, CALCIUM, MG, PHOS in the last 72 hours. Liver Function Tests: No results for input(s): AST, ALT, ALKPHOS, BILITOT, PROT, ALBUMIN in the last 72 hours.   CBC: No results for input(s): WBC, NEUTROABS, HGB, HCT, MCV, PLT in the last 72 hours.  No results found for this or any previous visit (from the past 240 hour(s)).   Hospital Course:   This is a 82 years old female with history of multiple medical illnesses who was admitted due obstructive jaundice. GI consult was done and EGD with ERCP was done.  Bile duct  stone was extracted and sphinectetromy was done.  Patient improved, however, she had left knee pain and swelling due to flare of her osteoarthritis. Patient is deconditioned and unable to perform her her ADL. SNF placement is planned for physical therapy. She will be continued on her antibiotics for 5 days.  Discharge Exam: Blood pressure 146/73, pulse 79, temperature 97.9 F (36.6 C), temperature source Oral, resp. rate 18, height 5\' 7"  (1.702 m), weight 61.236 kg (135 lb), SpO2 99 %.   Disposition:  Nursing home      Signed: Ayda Tancredi   01/10/2016, 8:25 AM

## 2016-01-27 NOTE — Care Management Note (Deleted)
Case Management Note  Patient Details  Name: Malasia Liszewski MRN: [id number] Date of Birth: Apr 22, 1922  Expected Discharge Date:              01/22/2016    Expected Discharge Plan:  [place]  In-House Referral:     Discharge planning Services  CM Consult  Post Acute Care Choice:    Choice offered to:     DME Arranged:    DME Agency:     HH Arranged:  PT [place]:  [place]  Status of Service:  In process, will continue to follow  Medicare Important Message Given:  Yes Date Medicare IM Given:    Medicare IM give by:    Date Additional Medicare IM Given:    Additional Medicare Important Message give by:     If discussed at [place] of Stay Meetings, dates discussed:    Additional Comments: Patient discharging today. Patient is going to [facility], arrangements made by CSW. No CM needs. Will sign off.   Sebastion Jun, Chauncey Reading, RN 01/17/2016, 11:05 AM

## 2016-01-27 NOTE — Progress Notes (Signed)
Approximately 7 hours after foley removed, pt still had not voided.  Bladder scan shows less than 200 cc's of urine in bladder.  Dr. Legrand Rams paged and made aware.  New orders received to replace foley and send pt to [facility] with foley in place and follow up with Urologist.  Daughter and patient made aware of the before mentioned.  [facility] social worker made aware and stated would make [facility] social worker aware.

## 2016-01-29 ENCOUNTER — Encounter: Payer: Self-pay | Admitting: Internal Medicine

## 2016-01-29 ENCOUNTER — Other Ambulatory Visit (HOSPITAL_COMMUNITY)
Admission: AD | Admit: 2016-01-29 | Discharge: 2016-01-29 | Disposition: A | Discharge status: Home / Self Care | Payer: Medicare Other | Source: Skilled Nursing Facility | Attending: Internal Medicine | Admitting: Internal Medicine

## 2016-01-29 ENCOUNTER — Non-Acute Institutional Stay (SKILLED_NURSING_FACILITY): Payer: Medicare Other | Admitting: Internal Medicine

## 2016-01-29 DIAGNOSIS — K831 Obstruction of bile duct: Secondary | ICD-10-CM

## 2016-01-29 DIAGNOSIS — I10 Essential (primary) hypertension: Secondary | ICD-10-CM | POA: Diagnosis not present

## 2016-01-29 DIAGNOSIS — R509 Fever, unspecified: Secondary | ICD-10-CM

## 2016-01-29 DIAGNOSIS — E038 Other specified hypothyroidism: Secondary | ICD-10-CM

## 2016-01-29 LAB — COMPREHENSIVE METABOLIC PANEL
ALK PHOS: 59 U/L (ref 38–126)
ALT: 15 U/L (ref 14–54)
AST: 13 U/L — ABNORMAL LOW (ref 15–41)
Albumin: 2.1 g/dL — ABNORMAL LOW (ref 3.5–5.0)
Anion gap: 6 (ref 5–15)
BILIRUBIN TOTAL: 0.6 mg/dL (ref 0.3–1.2)
BUN: 18 mg/dL (ref 6–20)
CALCIUM: 7.7 mg/dL — AB (ref 8.9–10.3)
CHLORIDE: 102 mmol/L (ref 101–111)
CO2: 21 mmol/L — ABNORMAL LOW (ref 22–32)
CREATININE: 0.8 mg/dL (ref 0.44–1.00)
Glucose, Bld: 97 mg/dL (ref 65–99)
Potassium: 4.1 mmol/L (ref 3.5–5.1)
Sodium: 129 mmol/L — ABNORMAL LOW (ref 135–145)
Total Protein: 6.2 g/dL — ABNORMAL LOW (ref 6.5–8.1)

## 2016-01-29 LAB — URINALYSIS, ROUTINE W REFLEX MICROSCOPIC
BILIRUBIN URINE: NEGATIVE
GLUCOSE, UA: NEGATIVE mg/dL
Ketones, ur: NEGATIVE mg/dL
Leukocytes, UA: NEGATIVE
Nitrite: NEGATIVE
Protein, ur: NEGATIVE mg/dL
SPECIFIC GRAVITY, URINE: 1.025 (ref 1.005–1.030)
pH: 5 (ref 5.0–8.0)

## 2016-01-29 LAB — CBC WITH DIFFERENTIAL/PLATELET
BASOS ABS: 0 10*3/uL (ref 0.0–0.1)
BASOS PCT: 0 %
EOS ABS: 0.1 10*3/uL (ref 0.0–0.7)
EOS PCT: 1 %
HEMATOCRIT: 28.9 % — AB (ref 36.0–46.0)
HEMOGLOBIN: 9.9 g/dL — AB (ref 12.0–15.0)
Lymphocytes Relative: 15 %
Lymphs Abs: 1.5 10*3/uL (ref 0.7–4.0)
MCH: 28.3 pg (ref 26.0–34.0)
MCHC: 34.3 g/dL (ref 30.0–36.0)
MCV: 82.6 fL (ref 78.0–100.0)
Monocytes Absolute: 1.6 10*3/uL — ABNORMAL HIGH (ref 0.1–1.0)
Monocytes Relative: 16 %
NEUTROS PCT: 69 %
Neutro Abs: 7.2 10*3/uL (ref 1.7–7.7)
PLATELETS: 302 10*3/uL (ref 150–400)
RBC: 3.5 MIL/uL — AB (ref 3.87–5.11)
RDW: 15.7 % — AB (ref 11.5–15.5)
WBC: 10.5 10*3/uL (ref 4.0–10.5)

## 2016-01-29 LAB — URINE MICROSCOPIC-ADD ON

## 2016-01-29 LAB — TSH: TSH: 6.247 u[IU]/mL — AB (ref 0.350–4.500)

## 2016-01-29 NOTE — Patient Instructions (Signed)
New orders for Matrix entry. CBC and differential  BMET  Hepatic profile TSH UA Urine C&S

## 2016-01-29 NOTE — Assessment & Plan Note (Signed)
CBC and differential,  liver function tests, urinalysis, urine C&S Tylenol as per protocol

## 2016-01-29 NOTE — Assessment & Plan Note (Signed)
TSH 

## 2016-01-29 NOTE — Assessment & Plan Note (Signed)
Blood pressure control adequate; no change in meds

## 2016-01-29 NOTE — Assessment & Plan Note (Addendum)
Clinically there is no evidence of ileus or acute abdominal process Liver enzymes will be rechecked

## 2016-01-29 NOTE — Progress Notes (Signed)
Patient ID: Jo Hardin, female   DOB: Oct 08, 1921, 82 y.o.   MRN: [id number]     [place] Room: 157  Chief Complaint  Patient presents with  . New Admit To SNF    New Admit to [facility]   Allergies  Allergen Reactions  . Codeine Nausea And Vomiting  . Nsaids      This is a comprehensive admission note to [place] personally performed by Unice Cobble MD on this date less than 30 days from date of admission. Included are preadmission medical/surgical history;reconciled medication list; family history; social history and comprehensive review of systems.  Corrections and additions to the records were documented . Comprehensive physical exam was also performed. Additionally a clinical summary was entered for each active diagnosis pertinent to this admission in the Problem List to enhance continuity of care.  PCP: Dr. [name]  HPI:The patient was hospitalized 5/23-05/03/2016 with biliary obstruction with jaundice. Liver function tests were significantly elevated. ALT was as high as 208 and AST 354. Total bilirubin was 4.0. GI consult and performed EGD and ERCP fraction of about duct stone and sphincterotomy. The LFTs on 5/26 revealed an AST of 47 and ALT of 100. She had profound leukocytosis with white counts up to 29,300. At discharge white count was 14,200.  She also exhibited a mild anemia with hemoglobin 9.1 and hematocrit 27.5. She did have mild platelet deficiency with platelet count of 112,000 at the time of discharge. She did have associated renal insufficiency with creatinine up to 1.93. Hospitalization was complicated by exacerbation of her osteoarthritis.  Because of her deconditioning, advanced age, and inability to perform her ADLs she was transferred to [facility] for physical therapy. The patient was discharged on Cipro bid until 02/01/16.   Past medical and surgical history: She has diagnoses of essential hypertension, hypothyroidism, GERD, dyslipidemia and  past history of breast cancer  Social history:She denies ever smoking or drinking.   Family history:She denies family history of diabetes, cancer, heart attack, or stroke.   Comprehensive review of systems: Veracity of review of systems is in question due to dementia. Date was given as 09/15/1947 in the president as "Hunt". She consistently complained of back pain but initially stated it was upper back pain and then subsequently lower back pain. She also complained consistently of constipation. All other review of systems were negative.  Constitutional: No fever,significant weight change, fatigue  Eyes: No redness, discharge, pain, vision change ENT/mouth: No nasal congestion,  purulent discharge, earache,change in hearing ,sore throat  Cardiovascular: No chest pain, palpitations,paroxysmal nocturnal dyspnea, claudication, edema  Respiratory: No cough, sputum production,hemoptysis, DOE , significant snoring,apnea  Gastrointestinal: No heartburn,dysphagia,abdominal pain, nausea / vomiting,rectal bleeding, melena Genitourinary: No dysuria,hematuria, pyuria,  incontinence, nocturia Dermatologic: No rash, pruritus, change in appearance of skin Neurologic: No dizziness,headache,syncope, seizures, numbness , tingling Psychiatric: No significant anxiety , depression, insomnia, anorexia Endocrine: No change in hair/skin/ nails, excessive thirst, excessive hunger, excessive urination  Hematologic/lymphatic: No significant bruising, lymphadenopathy,abnormal bleeding Allergy/immunology: No itchy/ watery eyes, significant sneezing, urticaria, angioedema  Physical exam:  Pertinent or positive findings: she appears much younger than her stated age of 10. Skin is very warm to touch. Repeat temperature was 101.1. She has a different alopecia. Dense arcus senilis is present. She has faint scleral icterus. The right lacrimal gland is prominent at the right medial orbit. She is edentulous. Foley  catheter in place. She has a faint tremor of the right hand. There are flexion contractures of the  fourth fingers bilaterally. Fusiform changes in the knees are noted with suggestion of patellar effusion. She expresses discomfort with manipulation of the right knee. Valgus deformity is present. She has nonpitting edema of the lower extremities.   General appearance:Adequately nourished; no acute distress , increased work of breathing is present.   Lymphatic: No lymphadenopathy about the head, neck, axilla . Eyes: No conjunctival inflammation or lid edema is present. Ears:  External ear exam shows no significant lesions or deformities.   Nose:  External nasal examination shows no deformity or inflammation. Nasal mucosa are pink and moist without lesions ,exudates Oral exam: lips and gums are healthy appearing.There is no oropharyngeal erythema or exudate . Neck:  No thyromegaly, masses, tenderness noted.    Heart:  Normal rate and regular rhythm. S1 and S2 normal without gallop, murmur, click, rub .  Lungs:Chest clear to auscultation without wheezes, rhonchi,rales , rubs. Abdomen:Bowel sounds are normal. Abdomen is soft and nontender with no organomegaly, hernias,masses. GU: deferred. Extremities:  No cyanosis, clubbing  Neurologic exam : Strength decreased  in upper & lower extremities Balance,Rhomberg,finger to nose testing could not be completed due to clinical state Deep tendon reflexes are equal Skin: Warm & dry w/o tenting. No significant lesions or rash.  See clinical summary under each active problem in the Problem List with associated updated therapeutic plan

## 2016-01-29 DEATH — deceased

## 2016-01-31 ENCOUNTER — Other Ambulatory Visit (HOSPITAL_COMMUNITY)
Admission: AD | Admit: 2016-01-31 | Discharge: 2016-01-31 | Disposition: A | Discharge status: Home / Self Care | Payer: Medicare Other | Source: Skilled Nursing Facility | Attending: Internal Medicine | Admitting: Internal Medicine

## 2016-01-31 DIAGNOSIS — I10 Essential (primary) hypertension: Secondary | ICD-10-CM | POA: Insufficient documentation

## 2016-01-31 LAB — BASIC METABOLIC PANEL
ANION GAP: 6 (ref 5–15)
BUN: 22 mg/dL — AB (ref 6–20)
BUN: 22 mg/dL — AB (ref 4–21)
CHLORIDE: 103 mmol/L (ref 101–111)
CO2: 22 mmol/L (ref 22–32)
CREATININE: 0.9 mg/dL (ref 0.5–1.1)
Calcium: 7.7 mg/dL — ABNORMAL LOW (ref 8.9–10.3)
Creatinine, Ser: 0.9 mg/dL (ref 0.44–1.00)
GFR calc Af Amer: >60 mL/min (ref >60)
GFR, EST NON AFRICAN AMERICAN: 54 mL/min — AB (ref >60)
Glucose, Bld: 100 mg/dL — ABNORMAL HIGH (ref 65–99)
Glucose: 100 mg/dL
POTASSIUM: 4.5 mmol/L (ref 3.5–5.1)
SODIUM: 131 mmol/L — AB (ref 135–145)
SODIUM: 131 mmol/L — AB (ref 137–147)

## 2016-01-31 LAB — URINE CULTURE

## 2016-02-03 ENCOUNTER — Non-Acute Institutional Stay (SKILLED_NURSING_FACILITY): Payer: Medicare Other | Admitting: Internal Medicine

## 2016-02-03 DIAGNOSIS — E871 Hypo-osmolality and hyponatremia: Secondary | ICD-10-CM | POA: Diagnosis not present

## 2016-02-03 DIAGNOSIS — E038 Other specified hypothyroidism: Secondary | ICD-10-CM

## 2016-02-03 DIAGNOSIS — F0390 Unspecified dementia without behavioral disturbance: Secondary | ICD-10-CM | POA: Diagnosis not present

## 2016-02-03 DIAGNOSIS — R509 Fever, unspecified: Secondary | ICD-10-CM

## 2016-02-03 NOTE — Assessment & Plan Note (Addendum)
Monitor chemistries; B12; VDRL Recheck TSH in mid-late August

## 2016-02-03 NOTE — Assessment & Plan Note (Signed)
CBC  LFTs

## 2016-02-03 NOTE — Assessment & Plan Note (Signed)
Recheck TSH mid-late August 2017

## 2016-02-03 NOTE — Progress Notes (Signed)
Patient ID: Jo Hardin, female   DOB: Aug 19, 1922, 82 y.o.   MRN: [id number]     [place] Room: 157  Chief Complaint  Patient presents with  . Acute Visit    Abnormal lab of decreased sodium   Allergies  Allergen Reactions  . Codeine Nausea And Vomiting  . Nsaids     This is a nursing facility follow up for specific acute issue of  Hyponatremia and confusion.   Interim medical record and care since last [place] visit was updated with review of diagnostic studies and change in clinical status since last visit were documented.  HPI: The patient was admitted to [facility] 01/29/16 following ERCP and extraction of common biliary duct stone and sphincterotomy . She has a history of an acute encephalopathy in 2016 according to the chart. When seen 6/1 she was febrile and debilitated following her acute hospitalization. She has history of hypothyroidism; TSH was 11.4 8 months ago. On 6/1 the TSH was 6.25 on a relatively high dose of Synthroid of 125 g daily; therefore , the Synthroid dose was not increased. Follow-up TSH  would be recommended in 6 weeks.  The sodium was 129 on 6/1; it has increased to 131. Minimal prerenal azotemia was present with a BUN of 22. Renal function is normal. According to her niece; the patient is normally very oriented  Comprehensive review of systems is negative except for low back pain which is a chronic issue.  Veracity of review of systems is in question due to dementia. She named the president as "Hunt "and was unable to give the date. No fever,significant weight change, fatigue  Eyes: No redness, discharge, pain, vision change ENT/mouth: No nasal congestion,  purulent discharge, earache,change in hearing ,sore throat  Cardiovascular: No chest pain, palpitations,paroxysmal nocturnal dyspnea, claudication, edema  Respiratory: No cough, sputum production,hemoptysis, DOE , significant snoring,apnea   Gastrointestinal: No heartburn,dysphagia,abdominal  pain, nausea / vomiting,rectal bleeding, melena,change in bowels Genitourinary: No dysuria,hematuria, pyuria,  incontinence, nocturia Musculoskeletal: No joint stiffness, joint swelling, weakness Dermatologic: No rash, pruritus, change in appearance of skin Neurologic: No dizziness,headache,syncope, seizures, numbness , tingling Psychiatric: No significant anxiety , depression, insomnia, anorexia Endocrine: No change in hair/skin/ nails, excessive thirst, excessive hunger, excessive urination  Hematologic/lymphatic: No significant bruising, lymphadenopathy,abnormal bleeding Allergy/immunology: No itchy/ watery eyes, significant sneezing, urticaria, angioedema  Physical exam:  Pertinent or positive findings: Low-grade fever of 99.4 present. she is sitting comfortably in the wheelchair. She exhibits involuntary mastication type maneuvers of the mandible. The optic globes are prominent. The lacrimal glands are prominent medially. She has dense arcus senilis. She is completely edentulous. Somewhat distant heart sounds. Second heart sound is increased. Breath sounds are generally decreased. She states that the abdomen was slightly tender to palpation. 1+ pedal edema is present. Pedal pulses are decreased. There is a dressed blister at the left heel. Reversed curvature of the lumbosacral spine is present.  General appearance:Adequately nourished; no acute distress , increased work of breathing is present.   Lymphatic: No lymphadenopathy about the head, neck, axilla . Eyes: No conjunctival inflammation or lid edema is present. There is no scleral icterus. Ears:  External ear exam shows no significant lesions or deformities.   Nose:  External nasal examination shows no deformity or inflammation. Nasal mucosa are pink and moist without lesions ,exudates Oral exam: lips and gums are healthy appearing.There is no oropharyngeal erythema or exudate . Neck:  No thyromegaly, masses, tenderness noted.     Heart:  Normal rate and regular rhythm. S1 normal without gallop, murmur, click, rub .  Lungs:Chest clear to auscultation without wheezes, rhonchi,rales , rubs. Abdomen:Bowel sounds are normal. Abdomen is soft  with no organomegaly, hernias,masses. GU: deferred  Extremities:  No cyanosis, clubbing  Neurologic exam : Strength generally decreased in upper & lower extremities Balance,Rhomberg,finger to nose testing could not be completed due to clinical state Deep tendon reflexes are equal Skin: Warm & dry w/o tenting. No significant lesions or rash.    See summary under each active problem in the Problem List with associated updated therapeutic plan

## 2016-02-03 NOTE — Patient Instructions (Addendum)
New orders for Matrix entry. CBC BMET  Hepatic profile B12 level VDRL Wound Care assessment of left heel lesion; rule out cellulitis/abscess in view of low-grade fever

## 2016-02-03 NOTE — Assessment & Plan Note (Signed)
BMET 

## 2016-02-04 ENCOUNTER — Encounter (HOSPITAL_COMMUNITY)
Admission: RE | Admit: 2016-02-04 | Discharge: 2016-02-04 | Disposition: A | Discharge status: Home / Self Care | Payer: No Typology Code available for payment source | Source: Skilled Nursing Facility | Attending: Internal Medicine | Admitting: Internal Medicine

## 2016-02-04 DIAGNOSIS — E785 Hyperlipidemia, unspecified: Secondary | ICD-10-CM | POA: Insufficient documentation

## 2016-02-04 DIAGNOSIS — R17 Unspecified jaundice: Secondary | ICD-10-CM | POA: Insufficient documentation

## 2016-02-04 DIAGNOSIS — I10 Essential (primary) hypertension: Secondary | ICD-10-CM | POA: Insufficient documentation

## 2016-02-04 DIAGNOSIS — Z48815 Encounter for surgical aftercare following surgery on the digestive system: Secondary | ICD-10-CM | POA: Insufficient documentation

## 2016-02-04 DIAGNOSIS — D509 Iron deficiency anemia, unspecified: Secondary | ICD-10-CM | POA: Insufficient documentation

## 2016-02-04 DIAGNOSIS — R262 Difficulty in walking, not elsewhere classified: Secondary | ICD-10-CM | POA: Insufficient documentation

## 2016-02-04 DIAGNOSIS — E039 Hypothyroidism, unspecified: Secondary | ICD-10-CM | POA: Diagnosis not present

## 2016-02-04 DIAGNOSIS — N390 Urinary tract infection, site not specified: Secondary | ICD-10-CM | POA: Diagnosis not present

## 2016-02-04 LAB — CBC WITH DIFFERENTIAL/PLATELET
Basophils Absolute: 0 10*3/uL (ref 0.0–0.1)
Basophils Relative: 1 %
Eosinophils Absolute: 0 10*3/uL (ref 0.0–0.7)
Eosinophils Relative: 1 %
HCT: 25.9 % — ABNORMAL LOW (ref 36.0–46.0)
Hemoglobin: 9 g/dL — ABNORMAL LOW (ref 12.0–15.0)
Lymphocytes Relative: 22 %
Lymphs Abs: 1.1 10*3/uL (ref 0.7–4.0)
MCH: 29.2 pg (ref 26.0–34.0)
MCHC: 34.7 g/dL (ref 30.0–36.0)
MCV: 84.1 fL (ref 78.0–100.0)
Monocytes Absolute: 0.6 10*3/uL (ref 0.1–1.0)
Monocytes Relative: 12 %
Neutro Abs: 3.4 10*3/uL (ref 1.7–7.7)
Neutrophils Relative %: 64 %
Platelets: 475 10*3/uL — ABNORMAL HIGH (ref 150–400)
RBC: 3.08 MIL/uL — ABNORMAL LOW (ref 3.87–5.11)
RDW: 16 % — ABNORMAL HIGH (ref 11.5–15.5)
WBC: 5.2 10*3/uL (ref 4.0–10.5)

## 2016-02-04 LAB — BASIC METABOLIC PANEL
Anion gap: 7 (ref 5–15)
BUN: 26 mg/dL — AB (ref 6–20)
CHLORIDE: 102 mmol/L (ref 101–111)
CO2: 21 mmol/L — AB (ref 22–32)
Calcium: 8.1 mg/dL — ABNORMAL LOW (ref 8.9–10.3)
Creatinine, Ser: 1.14 mg/dL — ABNORMAL HIGH (ref 0.44–1.00)
GFR calc Af Amer: 47 mL/min — ABNORMAL LOW (ref >60)
GFR calc non Af Amer: 40 mL/min — ABNORMAL LOW (ref >60)
GLUCOSE: 95 mg/dL (ref 65–99)
Potassium: 3.5 mmol/L (ref 3.5–5.1)
SODIUM: 130 mmol/L — AB (ref 135–145)

## 2016-02-04 LAB — HEPATIC FUNCTION PANEL
ALT: 10 U/L — ABNORMAL LOW (ref 14–54)
AST: 15 U/L (ref 15–41)
Albumin: 2.1 g/dL — ABNORMAL LOW (ref 3.5–5.0)
Alkaline Phosphatase: 49 U/L (ref 38–126)
Bilirubin, Direct: 0.2 mg/dL (ref 0.1–0.5)
Indirect Bilirubin: 0.3 mg/dL (ref 0.3–0.9)
Total Bilirubin: 0.5 mg/dL (ref 0.3–1.2)
Total Protein: 6 g/dL — ABNORMAL LOW (ref 6.5–8.1)

## 2016-02-04 LAB — LIPID PANEL
Cholesterol: 98 mg/dL (ref 0–200)
HDL: 30 mg/dL — AB (ref >40)
LDL CALC: 55 mg/dL (ref 0–99)
Total CHOL/HDL Ratio: 3.3 RATIO
Triglycerides: 67 mg/dL (ref <150)
VLDL: 13 mg/dL (ref 0–40)

## 2016-02-04 LAB — VITAMIN B12: Vitamin B-12: 579 pg/mL (ref 180–914)

## 2016-02-05 ENCOUNTER — Encounter: Payer: Self-pay | Admitting: Internal Medicine

## 2016-02-05 ENCOUNTER — Non-Acute Institutional Stay (SKILLED_NURSING_FACILITY): Payer: Medicare Other | Admitting: Internal Medicine

## 2016-02-05 DIAGNOSIS — R609 Edema, unspecified: Secondary | ICD-10-CM | POA: Insufficient documentation

## 2016-02-05 DIAGNOSIS — R600 Localized edema: Secondary | ICD-10-CM | POA: Diagnosis not present

## 2016-02-05 DIAGNOSIS — R509 Fever, unspecified: Secondary | ICD-10-CM

## 2016-02-05 NOTE — Assessment & Plan Note (Signed)
Urine C&S pending Blister left heel does not appear cellulitic

## 2016-02-05 NOTE — Patient Instructions (Signed)
Continue Ensure daily. Furosemide 40 milligrams daily. Sodium restriction

## 2016-02-05 NOTE — Assessment & Plan Note (Signed)
Sodium restriction Protein supplementation Furosemide 40 mg daily

## 2016-02-05 NOTE — Progress Notes (Signed)
   Facility Location: [facility] Room Number: 157-W  PCP: [name], MD [place] [address]   This is a nursing facility follow up for specific acute issue of pedal edema and intermittent fever. Interim medical record and care since last [place] visit was updated with review of diagnostic studies and change in clinical status since last visit were documented.   HPI: According to her daughter the edema has occurred since she's been in the nursing facility. She is on Norvasc but this has been a chronic medication and not associated with edema previously. Patient denies any active cardiopulmonary or infecrtious symptoms ; but severe dementia invalidates her responses She does persistently describe low back pain.  ROS: Frontal headache, facial pain , nasal purulence, sore throat , otic pain or otic discharge denied. Cough, sputum production, hemoptysis, or pleuritic pain denied. No diarrhea present.Cola colored urine or clay colored stools denied. Dysuria, pyuria, or hematuria not present. No new rashes or pustules except L heel blister. No redness or swelling of joints. Cardiovascular: No chest pain, palpitations,paroxysmal nocturnal dyspnea, claudication  Gastrointestinal: No heartburn,dysphagia,abdominal pain, nausea / vomiting,rectal bleeding, melena   Physical exam:  Pertinent or positive findings: She appears younger than her stated age. She has asymmetric ptosis, greater on the left. She has prominent lacrimal glands bilaterally. She's completely edentulous. She exhibits persistent chewing activity of the mandible. Her heart is slightly irregular. Bowel sounds are hyperactive. Foley catheter is in place. She has pitting edema 1-2 + from mid shin down. She has a 10 x 4.5 cm blister at the left heel which is not associated with purulence or cellulitis. Pedal pulses are decreased but he edema. Flexion of 4th digits  bilaterally She identified her daughter as her mother. She was unable to give her daughter's birthdate. She identified the president as "Hunter" . General appearance:Adequately nourished; no acute distress , increased work of breathing is present.   Lymphatic: No lymphadenopathy about the head, neck, axilla . Eyes: No conjunctival inflammation or lid edema is present. There is no scleral icterus. Ears:  External ear exam shows no significant lesions or deformities.   Nose:  External nasal examination shows no deformity or inflammation. Nasal mucosa are pink and moist without lesions ,exudates Oral exam: lips and gums are healthy appearing.There is no oropharyngeal erythema or exudate . Neck:  No thyromegaly, masses, tenderness noted.    Heart:  No gallop, murmur, click, rub .  Lungs:Chest clear to auscultation without wheezes, rhonchi,rales , rubs. Abdomen: Abdomen is soft and nontender with no organomegaly, hernias,masses. GU: deferred  Extremities:  No cyanosis, clubbing  Neurologic exam : Strength decreased  in upper & lower extremities Balance,Rhomberg,finger to nose testing could not be completed due to clinical state Deep tendon reflexes are equal Skin: Warm & dry w/o tenting. No significant  rash.    See summary under each active problem in the Problem List with associated updated therapeutic plan

## 2016-02-06 ENCOUNTER — Encounter (HOSPITAL_COMMUNITY)
Admission: AD | Admit: 2016-02-06 | Discharge: 2016-02-06 | Disposition: A | Discharge status: Home / Self Care | Payer: No Typology Code available for payment source | Source: Skilled Nursing Facility | Attending: Internal Medicine | Admitting: Internal Medicine

## 2016-02-06 DIAGNOSIS — N390 Urinary tract infection, site not specified: Secondary | ICD-10-CM | POA: Diagnosis not present

## 2016-02-06 DIAGNOSIS — I10 Essential (primary) hypertension: Secondary | ICD-10-CM | POA: Diagnosis not present

## 2016-02-06 DIAGNOSIS — E785 Hyperlipidemia, unspecified: Secondary | ICD-10-CM | POA: Diagnosis not present

## 2016-02-06 DIAGNOSIS — D509 Iron deficiency anemia, unspecified: Secondary | ICD-10-CM | POA: Diagnosis not present

## 2016-02-06 DIAGNOSIS — E039 Hypothyroidism, unspecified: Secondary | ICD-10-CM | POA: Diagnosis not present

## 2016-02-06 DIAGNOSIS — Z48815 Encounter for surgical aftercare following surgery on the digestive system: Secondary | ICD-10-CM | POA: Diagnosis not present

## 2016-02-06 LAB — BASIC METABOLIC PANEL
ANION GAP: 5 (ref 5–15)
BUN: 26 mg/dL — ABNORMAL HIGH (ref 6–20)
CHLORIDE: 104 mmol/L (ref 101–111)
CO2: 23 mmol/L (ref 22–32)
Calcium: 8.2 mg/dL — ABNORMAL LOW (ref 8.9–10.3)
Creatinine, Ser: 1.18 mg/dL — ABNORMAL HIGH (ref 0.44–1.00)
GFR calc non Af Amer: 39 mL/min — ABNORMAL LOW (ref >60)
GFR, EST AFRICAN AMERICAN: 45 mL/min — AB (ref >60)
GLUCOSE: 94 mg/dL (ref 65–99)
POTASSIUM: 3.3 mmol/L — AB (ref 3.5–5.1)
Sodium: 132 mmol/L — ABNORMAL LOW (ref 135–145)

## 2016-02-07 ENCOUNTER — Encounter (HOSPITAL_COMMUNITY)
Admission: RE | Admit: 2016-02-07 | Discharge: 2016-02-07 | Disposition: A | Discharge status: Home / Self Care | Payer: No Typology Code available for payment source | Source: Skilled Nursing Facility | Attending: Internal Medicine | Admitting: Internal Medicine

## 2016-02-07 DIAGNOSIS — D509 Iron deficiency anemia, unspecified: Secondary | ICD-10-CM | POA: Diagnosis not present

## 2016-02-07 DIAGNOSIS — N390 Urinary tract infection, site not specified: Secondary | ICD-10-CM | POA: Diagnosis not present

## 2016-02-07 DIAGNOSIS — E785 Hyperlipidemia, unspecified: Secondary | ICD-10-CM | POA: Diagnosis not present

## 2016-02-07 DIAGNOSIS — Z48815 Encounter for surgical aftercare following surgery on the digestive system: Secondary | ICD-10-CM | POA: Diagnosis not present

## 2016-02-07 DIAGNOSIS — I10 Essential (primary) hypertension: Secondary | ICD-10-CM | POA: Diagnosis not present

## 2016-02-07 DIAGNOSIS — E039 Hypothyroidism, unspecified: Secondary | ICD-10-CM | POA: Diagnosis not present

## 2016-02-07 HISTORY — DX: Urinary tract infection, site not specified: N39.0

## 2016-02-07 LAB — URINE MICROSCOPIC-ADD ON

## 2016-02-07 LAB — URINALYSIS, ROUTINE W REFLEX MICROSCOPIC
BILIRUBIN URINE: NEGATIVE
Glucose, UA: NEGATIVE mg/dL
KETONES UR: NEGATIVE mg/dL
NITRITE: NEGATIVE
PROTEIN: NEGATIVE mg/dL
Specific Gravity, Urine: 1.02 (ref 1.005–1.030)
pH: 6 (ref 5.0–8.0)

## 2016-02-09 LAB — URINE CULTURE

## 2016-02-10 ENCOUNTER — Encounter: Payer: Self-pay | Admitting: Internal Medicine

## 2016-02-10 ENCOUNTER — Non-Acute Institutional Stay (SKILLED_NURSING_FACILITY): Payer: Medicare Other | Admitting: Internal Medicine

## 2016-02-10 DIAGNOSIS — R197 Diarrhea, unspecified: Secondary | ICD-10-CM | POA: Diagnosis not present

## 2016-02-10 DIAGNOSIS — R634 Abnormal weight loss: Secondary | ICD-10-CM | POA: Diagnosis not present

## 2016-02-10 DIAGNOSIS — R609 Edema, unspecified: Secondary | ICD-10-CM

## 2016-02-10 NOTE — Progress Notes (Signed)
Location:  [facility] Room Number: 157/W Place of Service:  SNF (31) Provider:  [name], MD  Patient Care Team: [name], MD as PCP - General (Internal Medicine)  Extended Emergency Contact Information Primary Emergency Contact: [name] Address: [address]          [place],  [place] Home Phone: [phone] Relation: None Secondary Emergency Contact: [place], [place] [address] [place] Phone: [phone] Relation: Grandson  Code Status:  DNR Goals of care: Advanced Directive information Advanced Directives 02/10/2016  Does patient have an advance directive? Yes  Type of Advance Directive Out of facility DNR (pink MOST or yellow form)  Does patient want to make changes to advanced directive? No - Patient declined  Copy of advanced directive(s) in chart? Yes     Chief Complaint  Patient presents with  . Acute Visit    Lose of wt.    HPI:  Pt is a 82 y.o. female seen today for an acute visit for Fairly significant weight loss it appears over 10 pounds in the past week-there may be some scale variability here but apparently she does have some true weight loss as well as.  Of note she was recently started on Lasix secondary to lower extremity edema which appears to be improved somewhat-there is suspicion Lasix may be causing fluid loss which would lead to some of the weight loss.  She is also is eating and drinking somewhat poorly however staff and family are strongly encouraging by mouth intake and supplements are being encouraged as well.  Vital signs continued to be stable patient is a poor historian and cannot really give any review of systems.  Other than somewhat poor appetite nursing staff does not report acute issues although she appears to be quite weak and frail.  Apparently she's also had some diarrhea per her daughter she is not complaining of any abdominal  discomfort   Past Medical History  Diagnosis Date  . Arthritis   . Thyroid disease   . Cancer [facility])     had breast cancer 8 yrs ago  . Hypertension   . Hypomagnesemia   . GERD (gastroesophageal reflux disease)   . Osteoarthritis   . Hypokalemia   . Pancreatitis, acute 05/03/2015  . 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 Anemia   . Bradycardia   . HCAP (healthcare-associated pneumonia)    Past Surgical History  Procedure Laterality Date  . Breast surgery    . Esophagogastroduodenoscopy N/A 05/07/2015    Procedure: ESOPHAGOGASTRODUODENOSCOPY (EGD);  Surgeon: Rogene Houston, MD;  Location: [facility];  Service: Endoscopy;  Laterality: N/A;  . Ercp N/A 01/22/2016    Procedure: ENDOSCOPIC RETROGRADE CHOLANGIOPANCREATOGRAPHY (ERCP) With biliary sphincterotomy and stone extraction ;  Surgeon: Rogene Houston, MD;  Location: [facility] ORS;  Service: Endoscopy;  Laterality: N/A;    Allergies  Allergen Reactions  . Codeine Nausea And Vomiting  . Nsaids     Current Outpatient Prescriptions on File Prior to Visit  Medication Sig Dispense Refill  . acetaminophen (TYLENOL) 325 MG tablet Take 650 mg by mouth every 6 (six) hours as needed.    Marland Kitchen albuterol (PROVENTIL) (2.5 MG/3ML) 0.083% nebulizer solution Take 2.5 mg by nebulization every 6 (six) hours as needed for wheezing or shortness of breath.    Marland Kitchen amLODipine (NORVASC) 5 MG tablet Take  5 mg by mouth every morning.    Roseanne Kaufman [place]-Castor Oil (VENELEX) OINT Apply to sacrum and bilateral buttock every shift and as needed for prevention    . docusate sodium (COLACE) 100 MG capsule Take 100 mg by mouth every morning.    Marland Kitchen ENSURE (ENSURE) Take 1 Can by mouth daily.    Marland Kitchen levothyroxine (SYNTHROID, LEVOTHROID) 125 MCG tablet Take 125 mcg by mouth daily before breakfast.    . losartan (COZAAR) 100 MG tablet Take 100 mg by mouth daily.    Marland Kitchen omeprazole (PRILOSEC) 20 MG capsule Take 20 mg by mouth daily.    . potassium chloride  (K-DUR) 10 MEQ tablet Take 10 mEq by mouth 3 (three) times daily.     . simvastatin (ZOCOR) 10 MG tablet Take 10 mg by mouth at bedtime.    . traMADol (ULTRAM) 50 MG tablet Take 1 tablet (50 mg total) by mouth every 6 (six) hours as needed for moderate pain or severe pain. 30 tablet 0  . Vitamin D, Ergocalciferol, (DRISDOL) 50000 UNITS CAPS Take 50,000 Units by mouth every Tuesday.      No current facility-administered medications on file prior to visit.     Review of Systems   Again this is limited provided by nursing staff and daughter.  General does not complain of fever or chills there's been some weight loss.  Skin does not complain of rashes or itching.  Head ears eyes nose mouth and throat no visual changes sore throat noted at this time.  Respiratory does not complain of shortness breath or cough.  Cardiac no chest pain has lower extremity edema most O spell feet this has improved apparently somewhat with the Lasix.  GI is not complaining of nausea or vomiting apparently there's been some diarrhea and she does not complain of abdominal pain.  Musculoskeletal has weakness but is not complaining of joint pain.  Neurologic is not complaining of dizziness or headache.  psych no overt anxiety has been noted  Immunization History  Administered Date(s) Administered  . Influenza,inj,Quad PF,36+ Mos 05/04/2015   Pertinent  Health Maintenance Due  Topic Date Due  . DEXA SCAN  02/09/2017 (Originally 11/30/1988)  . PNA vac Low Risk Adult (1 of 2 - PCV13) 02/09/2017 (Originally 11/30/1988)  . INFLUENZA VACCINE  03/30/2016   No flowsheet data found. Functional Status Survey:    Filed Vitals:   02/10/16 1432  BP: 110/64  Pulse: 92  Temp: 98.6 F (37 C)  TempSrc: Oral  Resp: 18  Height: 5\' 4"  (1.626 m)  Weight: 115 lb 9.6 oz (52.436 kg)   Body mass index is 19.83 kg/(m^2). Physical Exam   Temperature is 98.1 pulse 78 respirations 20 blood pressure 116/70.  In general  this is a frail elderly female in no distress currently lying in bed.  Her skin is warm and dry.  Chest is clear to auscultation there is no labored breathing.  Heart is regular rate and rhythm with occasional irregular beats she has edema muscle spell pedal area apparently this has gone down somewhat from last week pedal pulses are somewhat reduced bilaterally.  Her abdomen is soft nontender with positive bowel sounds.  Muscle skeletal has general weakness and frailty especially lower extremities with arthritic changes.  Neurologic could not appreciate any lateralizing findings she does have some chronic movement of her mandibule  this is been noted previously as well.  Psych she does not speak much-history of dementia  Labs reviewed:  Recent  Labs  05/07/15 0614  01/31/16 0415 02/04/16 0630 02/06/16 0755  NA 134*  < > 131* 130* 132*  K 2.7*  < > 4.5 3.5 3.3*  CL 103  < > 103 102 104  CO2 24  < > 22 21* 23  GLUCOSE 99  < > 100* 95 94  BUN 10  < > 22* 26* 26*  CREATININE 0.67  < > 0.90 1.14* 1.18*  CALCIUM 7.8*  < > 7.7* 8.1* 8.2*  MG 1.4*  --   --   --   --   < > = values in this interval not displayed.  Recent Labs  01/23/16 0557 01/29/16 1410 02/04/16 0630  AST 47* 13* 15  ALT 100* 15 10*  ALKPHOS 79 59 49  BILITOT 1.0 0.6 0.5  PROT 5.6* 6.2* 6.0*  ALBUMIN 2.3* 2.1* 2.1*    Recent Labs  01/20/16 1603  01/24/16 0555 01/29/16 1410 02/04/16 0630  WBC 9.1  < > 14.2* 10.5 5.2  NEUTROABS 8.6*  --   --  7.2 3.4  HGB 10.2*  < > 9.1* 9.9* 9.0*  HCT 31.0*  < > 27.5* 28.9* 25.9*  MCV 86.6  < > 85.7 82.6 84.1  PLT 145*  < > 112* 302 475*  < > = values in this interval not displayed. Lab Results  Component Value Date   TSH 6.247* 01/29/2016   No results found for: HGBA1C Lab Results  Component Value Date   CHOL 98 02/04/2016   HDL 30* 02/04/2016   LDLCALC 55 02/04/2016   TRIG 67 02/04/2016   CHOLHDL 3.3 02/04/2016    Significant Diagnostic Results in  last 30 days:  Dg Knee 1-2 Views Left  01/24/2016  CLINICAL DATA:  Left knee Pain with movement since being admitted, pt had GB surgery this week, EXAM: LEFT KNEE - 1-2 VIEW COMPARISON:  None. FINDINGS: Advanced tricompartmental degenerative changes with significant loss of articular cartilage in medial and lateral compartments, marginal spurring about all 3 compartments, and remodeling of the femoral condyles and medial tibial plateau. No acute fracture or dislocation. Small effusion in the suprapatellar bursa. Patchy popliteal arterial calcifications. IMPRESSION: 1. Tricompartmental degenerative changes as above with small effusion. 2. Negative for acute fracture or dislocation. Electronically Signed   By: Lucrezia Europe M.D.   On: 01/24/2016 10:32   Ct Abdomen Pelvis W Contrast  01/20/2016  CLINICAL DATA:  Nausea and vomiting for 1 day EXAM: CT ABDOMEN AND PELVIS WITH CONTRAST TECHNIQUE: Multidetector CT imaging of the abdomen and pelvis was performed using the standard protocol following bolus administration of intravenous contrast. CONTRAST:  180mL ISOVUE-300 IOPAMIDOL (ISOVUE-300) INJECTION 61% COMPARISON:  01/20/2016, 05/06/2015 FINDINGS: Lung bases demonstrate some dependent atelectatic changes without focal confluent infiltrate. The liver demonstrates a somewhat mottled enhancement pattern in the midportion of the right lobe of the liver likely related to the underlying biliary ductal dilatation and some change in the degree of per fusion. Gallbladder is well distended. The changes seen on recent ultrasound are less well appreciated on the current exam. Common bile duct is dilated to 17 mm. This extends inferiorly to the level of the ampulla. A a somewhat rounded filling defect is noted. This may represent a poorly calcified stone although the possibility of underlying mass lesion would deserve consideration as well. No significant pancreatic ductal dilatation is seen. Spleen, adrenal glands, pancreas and  kidneys are otherwise within normal limits. The bladder is well distended. The uterus again show some calcific  changes consistent with uterine fibroids. Central decreased attenuation is noted which may also be related to underlying fibroid disease. No pelvic mass lesion or sidewall adenopathy is noted. Mild aortoiliac calcifications are seen without aneurysmal dilatation. The osseous structures show degenerative change of the lumbar spine. No acute compression deformity is noted. IMPRESSION: Significant biliary ductal dilatation with dilatation of the gallbladder. A rounded soft tissue density is noted distally best seen on the coronal imaging. A neoplasm would be difficult to exclude. ERCP is recommended for further evaluation as well as drainage. Changes in the degree of perfusion of the liver likely related to the biliary ductal dilatation. There are changes suggestive of some thrombosis of some of the right portal venous branches best seen on image number 14 of series 2. Chronic changes as described. Electronically Signed   By: Inez Catalina M.D.   On: 01/20/2016 18:29   US Abdomen Limited  01/23/2016  CLINICAL DATA:  Status post retrieval of common bile duct stones by ERCP 01/22/2016, presenting for follow-up. EXAM: US ABDOMEN LIMITED - RIGHT UPPER QUADRANT COMPARISON:  01/20/2016 CT abdomen/ pelvis. 01/22/2016 fluoroscopic ERCP images. FINDINGS: Gallbladder: Gallbladder distention has significantly decreased since 01/20/2016 CT study. Mild diffuse gallbladder wall thickening. Layering sludge in the gallbladder. No shadowing gallstones are demonstrated in the gallbladder. No pericholecystic fluid or sonographic Murphy's sign. Common bile duct: Diameter: 3 mm Liver: Liver parenchymal echogenicity and echotexture are within normal limits. Scattered pneumobilia is present within the minimally prominent intrahepatic bile ducts, which appear decreased in caliber. No liver mass or definite liver surface  irregularity demonstrated. IMPRESSION: 1. Normal common bile duct diameter 3 mm, significantly decreased. Expected post ERCP pneumobilia within the minimally prominent intrahepatic bile ducts, which have decreased in caliber since 01/20/2016. 2. Decreased gallbladder distention. Layering sludge in the gallbladder. No shadowing gallstones demonstrated in the gallbladder. Nonspecific mild diffuse gallbladder wall thickening, probably due to the resolving biliary obstruction. No pericholecystic fluid or sonographic Murphy's sign. 3. Normal liver. Electronically Signed   By: Ilona Sorrel M.D.   On: 01/23/2016 15:31   Dg Ercp With Sphincterotomy  01/22/2016  CLINICAL DATA:  Obstructive jaundice EXAM: ERCP TECHNIQUE: Multiple spot images obtained with the fluoroscopic device and submitted for interpretation post-procedure. FLUOROSCOPY TIME:  2 minutes 36 second COMPARISON:  CT 01/20/2016 FINDINGS: A series of fluoroscopic clips document endoscopic cannulation and opacification of the CBD. CBD dilated. Mobile filling defects in the proximal and mid CBD. Incomplete opacification of the central intrahepatic bile ducts, which appear mildly distended centrally. Endoscopic basket with stone capture. IMPRESSION: 1. Filling defects in dilated CBD, with endoscopic intervention These images were submitted for radiologic interpretation only. Please see the procedural report for the amount of contrast and the fluoroscopy time utilized. Electronically Signed   By: Lucrezia Europe M.D.   On: 01/22/2016 15:52   US Abdomen Limited Ruq  01/20/2016  CLINICAL DATA:  Nausea and vomiting since this morning. EXAM: US ABDOMEN LIMITED - RIGHT UPPER QUADRANT COMPARISON:  CT 05/03/2015 and MRI 05/06/2015 FINDINGS: Gallbladder: Hydropic gallbladder measuring 11.2 cm in length. Moderate gallbladder sludge with several small stones the largest measuring 3 mm. There is wall thickening measuring up to 9 mm with pericholecystic fluid. Positive  sonographic Murphy's sign. Common bile duct: Diameter: 18 mm without evidence of choledocholithiasis. Liver: Center intrahepatic duct dilatation. IMPRESSION: Dilated gallbladder with constellation of findings which may be due to acute cholecystitis. Moderate dilatation of the common bile duct without evidence of ductal stones. Intrahepatic ductal dilatation.  Further evaluation with cross-sectional imaging may be helpful to exclude a more distal obstructing process at the level of the head of the pancreas/ampulla. Electronically Signed   By: Marin Olp M.D.   On: 01/20/2016 18:01    Assessment/Plan  #1-weight loss I suspect Lasix will be contributing to this-edema has improved somewhat will hold her Lasix for now and monitor weights closely clinically she is stable vital signs are stable.  I do note she has a potassium of 3.3 this is being supplemented this will need updating with a metabolic panel tomorrow.  In regards to possible diarrhea will order C. difficile any loose stools abdominal exam today was benign.  [place], [place], [place]

## 2016-02-11 ENCOUNTER — Encounter: Payer: Self-pay | Admitting: Internal Medicine

## 2016-02-11 ENCOUNTER — Non-Acute Institutional Stay (SKILLED_NURSING_FACILITY): Payer: Medicare Other | Admitting: Internal Medicine

## 2016-02-11 ENCOUNTER — Encounter (HOSPITAL_COMMUNITY)
Admission: RE | Admit: 2016-02-11 | Discharge: 2016-02-11 | Disposition: A | Discharge status: Home / Self Care | Payer: No Typology Code available for payment source | Source: Skilled Nursing Facility | Attending: Internal Medicine | Admitting: Internal Medicine

## 2016-02-11 DIAGNOSIS — E876 Hypokalemia: Secondary | ICD-10-CM

## 2016-02-11 DIAGNOSIS — E039 Hypothyroidism, unspecified: Secondary | ICD-10-CM | POA: Diagnosis not present

## 2016-02-11 DIAGNOSIS — E785 Hyperlipidemia, unspecified: Secondary | ICD-10-CM | POA: Diagnosis not present

## 2016-02-11 DIAGNOSIS — D509 Iron deficiency anemia, unspecified: Secondary | ICD-10-CM

## 2016-02-11 DIAGNOSIS — R509 Fever, unspecified: Secondary | ICD-10-CM | POA: Diagnosis not present

## 2016-02-11 DIAGNOSIS — R600 Localized edema: Secondary | ICD-10-CM | POA: Diagnosis not present

## 2016-02-11 DIAGNOSIS — N390 Urinary tract infection, site not specified: Secondary | ICD-10-CM | POA: Diagnosis not present

## 2016-02-11 DIAGNOSIS — E559 Vitamin D deficiency, unspecified: Secondary | ICD-10-CM | POA: Diagnosis not present

## 2016-02-11 DIAGNOSIS — Z48815 Encounter for surgical aftercare following surgery on the digestive system: Secondary | ICD-10-CM | POA: Diagnosis not present

## 2016-02-11 DIAGNOSIS — I10 Essential (primary) hypertension: Secondary | ICD-10-CM | POA: Diagnosis not present

## 2016-02-11 LAB — CBC WITH DIFFERENTIAL/PLATELET
BASOS ABS: 0 10*3/uL (ref 0.0–0.1)
Basophils Relative: 1 %
Eosinophils Absolute: 0.1 10*3/uL (ref 0.0–0.7)
Eosinophils Relative: 3 %
HEMATOCRIT: 26.1 % — AB (ref 36.0–46.0)
Hemoglobin: 8.8 g/dL — ABNORMAL LOW (ref 12.0–15.0)
LYMPHS ABS: 1.2 10*3/uL (ref 0.7–4.0)
LYMPHS PCT: 26 %
MCH: 29.1 pg (ref 26.0–34.0)
MCHC: 33.7 g/dL (ref 30.0–36.0)
MCV: 86.4 fL (ref 78.0–100.0)
MONO ABS: 0.7 10*3/uL (ref 0.1–1.0)
MONOS PCT: 16 %
NEUTROS ABS: 2.5 10*3/uL (ref 1.7–7.7)
Neutrophils Relative %: 54 %
Platelets: 291 10*3/uL (ref 150–400)
RBC: 3.02 MIL/uL — ABNORMAL LOW (ref 3.87–5.11)
RDW: 16.8 % — AB (ref 11.5–15.5)
WBC: 4.5 10*3/uL (ref 4.0–10.5)

## 2016-02-11 LAB — COMPREHENSIVE METABOLIC PANEL
ALT: 18 U/L (ref 14–54)
AST: 30 U/L (ref 15–41)
Albumin: 2.4 g/dL — ABNORMAL LOW (ref 3.5–5.0)
Alkaline Phosphatase: 43 U/L (ref 38–126)
Anion gap: 4 — ABNORMAL LOW (ref 5–15)
BILIRUBIN TOTAL: 0.8 mg/dL (ref 0.3–1.2)
BUN: 33 mg/dL — AB (ref 6–20)
CO2: 28 mmol/L (ref 22–32)
CREATININE: 1.08 mg/dL — AB (ref 0.44–1.00)
Calcium: 8.2 mg/dL — ABNORMAL LOW (ref 8.9–10.3)
Chloride: 105 mmol/L (ref 101–111)
GFR, EST AFRICAN AMERICAN: 50 mL/min — AB (ref >60)
GFR, EST NON AFRICAN AMERICAN: 43 mL/min — AB (ref >60)
Glucose, Bld: 101 mg/dL — ABNORMAL HIGH (ref 65–99)
POTASSIUM: 2.7 mmol/L — AB (ref 3.5–5.1)
Sodium: 137 mmol/L (ref 135–145)
TOTAL PROTEIN: 6.1 g/dL — AB (ref 6.5–8.1)

## 2016-02-11 NOTE — Patient Instructions (Signed)
BMET will be rechecked in the morning after 20 mEq of potassium chloride twice a day today. Because of very high supplemental dose of vitamin D vitamin D 25-hydroxy level will be checked.

## 2016-02-11 NOTE — Assessment & Plan Note (Signed)
Check vitamin D level due to the extremely high supplement dose

## 2016-02-11 NOTE — Assessment & Plan Note (Signed)
Albumin has improved somewhat; but hypoalbuminemia may be the major factor in her peripheral edema. Continue one-on-one feedings and the protein supplementation

## 2016-02-11 NOTE — Progress Notes (Signed)
Patient ID: Jo Hardin, female   DOB: 15-Dec-1921, 82 y.o.   MRN: [id number]   This is a nursing facility follow up for specific acute issue of severe hypokalemia ; potassium was critical at  2.7 this morning.  Interim medical record and care since last [place] visit was updated with review of diagnostic studies and change in clinical status since last visit were documented.  HPI: The patient has had a weight loss of 13 pounds prompting discontinuation of the furosemide yesterday 6/13. As noted potassium was 2.7 this morning. Mild renal insufficiency is present but is stable. Hemoglobin did drop slightly from 9 to 8.8 but the hematocrit is up slightly at 26.1. There is no evidence of progressive anemia. Albumin has improved slightly but remains profoundly low at 2.4. Urine culture 6/10 revealed greater than 100,000 colonies of coag-negative staph for which she is receiving nitrofurantoin. Her fevers have resolved. Earlier this month she had been on Cipro at the time of discharge from the hospital to [facility].  She is on a high-dose of vitamin D supplement 50,000 international units once weekly. There is no current vitamin D level on record. She is a high-dose supplement of Synthroid @ 125 mcg. The TSH is mildly elevated at 6.247;dose will not be changed. It'll be safer for her to have a slight TSH elevation rather than the dysrhythmia risk associated with high-dose Synthroid.  Review of systems is unreliable because of dementia. Her only persistent complaint is "back pain". Denied are chest pain, palpitations, claudication, paroxysmal nocturnal dyspnea, exertional dyspnea, significant cough, or hemoptysis. Abdominal pain, significant dyspepsia, dysphagia, melena, rectal bleeding, or persistently small caliber stools are denied.. Dysuria, pyuria, hematuria, frequency, nocturia or polyuria are denied.  Physical exam:  Pertinent or positive findings: Initially  she was being fed one on one.  It is reported that help in feeding is necessary  to guarantee adequate intake (see note concerning albumin above). She appears frail and suboptimally nourished. Lacrimal glands are prominent. She is completely edentulous. Repetitive chewing motion of the mandible noted Breath sounds are decreased. Heart sounds are somewhat distant. She has trace edema of the lower extremities. Posterior tibial pulses are decreased. A wound at the left heel is dressed.   General appearance:no acute distress , increased work of breathing is present.   Lymphatic: No lymphadenopathy about the head, neck, axilla . Eyes: No conjunctival inflammation or lid edema is present. There is no scleral icterus. Ears:  External ear exam shows no significant lesions or deformities.   Nose:  External nasal examination shows no deformity or inflammation. Nasal mucosa are pink and moist without lesions ,exudates Oral exam: lips and gums are healthy appearing.There is no oropharyngeal erythema or exudate . Neck:  No thyromegaly, masses, tenderness noted.    Heart:  Normal rate and regular rhythm. S1 and S2 normal without gallop, murmur, click, rub .  Lungs:Chest clear to auscultation without wheezes, rhonchi,rales , rubs. Abdomen:Bowel sounds are normal. Abdomen is soft and nontender with no organomegaly, hernias,masses. GU: Foley in place Extremities:  No cyanosis, clubbing  Neurologic exam : Strength equal  in upper & lower extremities but decreased Balance,Rhomberg,finger to nose testing could not be completed due to clinical state Skin: Warm & dry     See summary under each active problem in the Problem List with associated updated therapeutic plan

## 2016-02-11 NOTE — Assessment & Plan Note (Signed)
Hold furosemide . The edema may be a function of her profound hypoalbuminemia. Potassium chloride 20 mEq twice a recheck BMET in a.m.  Continue protein supplementation and one-on-one feeding.

## 2016-02-11 NOTE — Progress Notes (Signed)
This encounter was created in error - please disregard.

## 2016-02-11 NOTE — Assessment & Plan Note (Signed)
Anemia is essentially stable. Iron supplementations will be continued.

## 2016-02-11 NOTE — Assessment & Plan Note (Signed)
Patient presently on 10 day course of nitrofurantoin for coag negative staph urinary tract infection. Fever has resolved.

## 2016-02-12 ENCOUNTER — Encounter (HOSPITAL_COMMUNITY)
Admission: AD | Admit: 2016-02-12 | Discharge: 2016-02-12 | Disposition: A | Discharge status: Home / Self Care | Payer: No Typology Code available for payment source | Source: Skilled Nursing Facility | Attending: Internal Medicine | Admitting: Internal Medicine

## 2016-02-12 DIAGNOSIS — Z48815 Encounter for surgical aftercare following surgery on the digestive system: Secondary | ICD-10-CM | POA: Diagnosis not present

## 2016-02-12 DIAGNOSIS — E785 Hyperlipidemia, unspecified: Secondary | ICD-10-CM | POA: Diagnosis not present

## 2016-02-12 DIAGNOSIS — I10 Essential (primary) hypertension: Secondary | ICD-10-CM | POA: Diagnosis not present

## 2016-02-12 DIAGNOSIS — N390 Urinary tract infection, site not specified: Secondary | ICD-10-CM | POA: Diagnosis not present

## 2016-02-12 DIAGNOSIS — D509 Iron deficiency anemia, unspecified: Secondary | ICD-10-CM | POA: Diagnosis not present

## 2016-02-12 DIAGNOSIS — E039 Hypothyroidism, unspecified: Secondary | ICD-10-CM | POA: Diagnosis not present

## 2016-02-12 LAB — BASIC METABOLIC PANEL
ANION GAP: 7 (ref 5–15)
BUN: 30 mg/dL — ABNORMAL HIGH (ref 6–20)
CHLORIDE: 106 mmol/L (ref 101–111)
CO2: 28 mmol/L (ref 22–32)
Calcium: 8.3 mg/dL — ABNORMAL LOW (ref 8.9–10.3)
Creatinine, Ser: 0.9 mg/dL (ref 0.44–1.00)
GFR calc non Af Amer: 54 mL/min — ABNORMAL LOW (ref >60)
Glucose, Bld: 96 mg/dL (ref 65–99)
POTASSIUM: 3.3 mmol/L — AB (ref 3.5–5.1)
SODIUM: 141 mmol/L (ref 135–145)

## 2016-02-13 ENCOUNTER — Non-Acute Institutional Stay (SKILLED_NURSING_FACILITY): Payer: Medicare Other | Admitting: Internal Medicine

## 2016-02-13 ENCOUNTER — Encounter: Payer: Self-pay | Admitting: Internal Medicine

## 2016-02-13 DIAGNOSIS — R609 Edema, unspecified: Secondary | ICD-10-CM

## 2016-02-13 DIAGNOSIS — B370 Candidal stomatitis: Secondary | ICD-10-CM

## 2016-02-13 DIAGNOSIS — R634 Abnormal weight loss: Secondary | ICD-10-CM

## 2016-02-13 DIAGNOSIS — E876 Hypokalemia: Secondary | ICD-10-CM

## 2016-02-13 LAB — VITAMIN D 25 HYDROXY (VIT D DEFICIENCY, FRACTURES): VIT D 25 HYDROXY: 92.4 ng/mL (ref 30.0–100.0)

## 2016-02-13 NOTE — Progress Notes (Signed)
Location:  [facility] Room Number: 157/W Place of Service:  SNF (31) Provider:  [name], MD  Patient Care Team: [name], MD as PCP - General (Internal Medicine)  Extended Emergency Contact Information Primary Emergency Contact: [name] Address: [address]          [place],  [place] Home Phone: [phone] Relation: None Secondary Emergency Contact: [place], [place] [address] [place] Phone: [phone] Relation: Grandson  Code Status:  DNR Goals of care: Advanced Directive information Advanced Directives 02/13/2016  Does patient have an advance directive? Yes  Type of Advance Directive Out of facility DNR (pink MOST or yellow form)  Does patient want to make changes to advanced directive? No - Patient declined  Copy of advanced directive(s) in chart? Yes     Chief Complaint  Patient presents with  . Acute Visit     Patients c/o Thrush   Follow-up edema-weight loss--hypokalemia  HPI:  Pt is a 82 y.o. female seen today for an acute visit for complaints of thrush  Patient also was recently seen for significant weight loss thought possibly secondary to her Lasix which she was recently put on for edema-edema appears to be somewhat improved-weight appears to have moderated here and stabilized although this will have to be monitored Her Lasix has been put on hold secondary to the weight loss.  She does have some increased edema of her left leg-mainly foot-- according to her daughter--is does not appear to be erythematous or painful  Potassium also was noted be significantly low at 2.7 earlier this week she has been*potassium 20 mEq twice a day and it has come up to 3.3 as of lab done yesterday     Past Medical History  Diagnosis Date  . Arthritis   . Thyroid disease   . Cancer [facility])     had breast cancer 8 yrs ago  . Hypertension   . Hypomagnesemia   . GERD  (gastroesophageal reflux disease)   . Osteoarthritis   . Hypokalemia   . Pancreatitis, acute 05/03/2015  . 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 Anemia   . Bradycardia   . HCAP (healthcare-associated pneumonia)   . Urinary tract infection 02/07/16    Coag negative staph. Nitrofurantoin prescribed    Past Surgical History  Procedure Laterality Date  . Breast surgery    . Esophagogastroduodenoscopy N/A 05/07/2015    Procedure: ESOPHAGOGASTRODUODENOSCOPY (EGD);  Surgeon: Rogene Houston, MD;  Location[facility]O [facility];  Service: Endoscopy;  Laterality: N/A;  . Ercp N/A 01/22/2016    Procedure: ENDOSCOPIC RETROGRADE CHOLANGIOPANCREATOGRAPHY (ERCP) With biliary sphincterotomy and stone extraction ;  Surgeon: Rogene Houston, MD;  Location: [facility] ORS;  Service: Endoscopy;  Laterality: N/A;    Allergies  Allergen Reactions  . Codeine Nausea And Vomiting  . Nsaids     Current Outpatient Prescriptions on File Prior to Visit  Medication Sig Dispense Refill  . acetaminophen (TYLENOL) 325 MG tablet Take 650 mg by mouth every 6 (six) hours as needed.    Marland Kitchen albuterol (PROVENTIL) (2.5 MG/3ML) 0.083% nebulizer solution Take 2.5 mg by nebulization every 6 (six) hours as needed for wheezing or shortness of breath.    . Amino Acids-Protein Hydrolys (FEEDING SUPPLEMENT, PRO-STAT SUGAR FREE 64,) LIQD ProStat 30 ml TID mix with 4 oz water or juice three times a day  between meals    . amLODipine (NORVASC) 5 MG tablet Take 5 mg by mouth every morning.    Roseanne Kaufman Peru-Castor Oil (VENELEX) OINT Apply to sacrum and bilateral buttock every shift and as needed for prevention    . docusate sodium (COLACE) 100 MG capsule Take 100 mg by mouth every morning.    Marland Kitchen ENSURE (ENSURE) Take 1 Can by mouth daily.    Marland Kitchen levothyroxine (SYNTHROID, LEVOTHROID) 125 MCG tablet Take 125 mcg by mouth daily before breakfast.    . losartan (COZAAR) 100 MG tablet Take 100 mg by mouth daily.    . nitrofurantoin  (MACRODANTIN) 100 MG capsule Take 100 mg by mouth 2 (two) times daily. START DATE 02/09/2016, END DATE 02/19/2016 ( FOR STAPH UTI )    . omeprazole (PRILOSEC) 20 MG capsule Take 20 mg by mouth daily.    . potassium chloride (K-DUR) 10 MEQ tablet Take 10 mEq by mouth 3 (three) times daily.     . simvastatin (ZOCOR) 10 MG tablet Take 10 mg by mouth at bedtime.    . traMADol (ULTRAM) 50 MG tablet Take 1 tablet (50 mg total) by mouth every 6 (six) hours as needed for moderate pain or severe pain. 30 tablet 0  . Vitamin D, Ergocalciferol, (DRISDOL) 50000 UNITS CAPS Take 50,000 Units by mouth every Tuesday.      No current facility-administered medications on file prior to visit.     Review of Systems  Provided by daughter and patient patient is a poor historian.  In general does not complaining of fever chills.  Skin does not complaining of rashes or itching.  Head ears eyes nose mouth and throat no complaint of visual changes has complained of apparently irritated throat with thrush.  Respiratory is not complaining of shortness of breath or cough.  Cardiac does not complain of chest pain edema appears to have moderated some Lasix is on hold however secondary to weight loss.  GI is not complaining of nausea vomiting diarrhea constipation has a poor appetite staff and family are encouraging eating and she actually is eating today when I came into the room during dinner.  Musculoskeletal has significant weakness but is not complaining of pain.  Neurologic is not complaining of dizziness headache or syncopal-type feelings.   psych history of dementia this appears to be at baseline and I suspect progressive  Immunization History  Administered Date(s) Administered  . Influenza,inj,Quad PF,36+ Mos 05/04/2015   Pertinent  Health Maintenance Due  Topic Date Due  . DEXA SCAN  02/09/2017 (Originally 11/30/1988)  . PNA vac Low Risk Adult (1 of 2 - PCV13) 02/09/2017 (Originally 11/30/1988)  .  INFLUENZA VACCINE  03/30/2016   No flowsheet data found. Functional Status Survey:    Filed Vitals:   02/13/16 1323  BP: 122/76  Pulse: 82  Temp: 98.7 F (37.1 C)  TempSrc: Oral  Resp: 16  Height: 5\' 4"  (1.626 m)  Weight: 112 lb 3.2 oz (50.894 kg)   Body mass index is 19.25 kg/(m^2). Physical Exam   In general this is a frail elderly female in no distress she is currently eating her dinner.  Her skin is warm and dry.  Oropharynx somewhat difficult exam since patient is eating but does appear to have a thin brown film on her tongue.  Chest clear to auscultation with somewhat shallow air entry no labored breathing.  Heart is regular rate and rhythm without murmur gallop or rub edema appears to be improved somewhat  from when Dr. Donivan Scull her initially---however she does  has some left pedal edema most prominently with a reduced pedal pulse this does not appear to be tender erythematous however  Diminished soft nontender positive bowel sounds.  Muscle skeletal has significant weakness and arthritic changes I do not note any acute deformities however.  Neurologic is grossly intact she does have some baseline movement of her mandible which appears is suspected be chronic.  Psych she is oriented to self she is eating some tonight with strong encouragement by her daughter at bedside    Labs reviewed:  Recent Labs  05/07/15 0614  02/06/16 0755 02/11/16 0705 02/12/16 0800  NA 134*  < > 132* 137 141  K 2.7*  < > 3.3* 2.7* 3.3*  CL 103  < > 104 105 106  CO2 24  < > 23 28 28   GLUCOSE 99  < > 94 101* 96  BUN 10  < > 26* 33* 30*  CREATININE 0.67  < > 1.18* 1.08* 0.90  CALCIUM 7.8*  < > 8.2* 8.2* 8.3*  MG 1.4*  --   --   --   --   < > = values in this interval not displayed.  Recent Labs  01/29/16 1410 02/04/16 0630 02/11/16 0705  AST 13* 15 30  ALT 15 10* 18  ALKPHOS 59 49 43  BILITOT 0.6 0.5 0.8  PROT 6.2* 6.0* 6.1*  ALBUMIN 2.1* 2.1* 2.4*    Recent Labs   01/29/16 1410 02/04/16 0630 02/11/16 0705  WBC 10.5 5.2 4.5  NEUTROABS 7.2 3.4 2.5  HGB 9.9* 9.0* 8.8*  HCT 28.9* 25.9* 26.1*  MCV 82.6 84.1 86.4  PLT 302 475* 291   Lab Results  Component Value Date   TSH 6.247* 01/29/2016   No results found for: HGBA1C Lab Results  Component Value Date   CHOL 98 02/04/2016   HDL 30* 02/04/2016   LDLCALC 55 02/04/2016   TRIG 67 02/04/2016   CHOLHDL 3.3 02/04/2016    Significant Diagnostic Results in last 30 days:  Dg Knee 1-2 Views Left  01/24/2016  CLINICAL DATA:  Left knee Pain with movement since being admitted, pt had GB surgery this week, EXAM: LEFT KNEE - 1-2 VIEW COMPARISON:  None. FINDINGS: Advanced tricompartmental degenerative changes with significant loss of articular cartilage in medial and lateral compartments, marginal spurring about all 3 compartments, and remodeling of the femoral condyles and medial tibial plateau. No acute fracture or dislocation. Small effusion in the suprapatellar bursa. Patchy popliteal arterial calcifications. IMPRESSION: 1. Tricompartmental degenerative changes as above with small effusion. 2. Negative for acute fracture or dislocation. Electronically Signed   By: Lucrezia Europe M.D.   On: 01/24/2016 10:32   Ct Abdomen Pelvis W Contrast  01/20/2016  CLINICAL DATA:  Nausea and vomiting for 1 day EXAM: CT ABDOMEN AND PELVIS WITH CONTRAST TECHNIQUE: Multidetector CT imaging of the abdomen and pelvis was performed using the standard protocol following bolus administration of intravenous contrast. CONTRAST:  128mL ISOVUE-300 IOPAMIDOL (ISOVUE-300) INJECTION 61% COMPARISON:  01/20/2016, 05/06/2015 FINDINGS: Lung bases demonstrate some dependent atelectatic changes without focal confluent infiltrate. The liver demonstrates a somewhat mottled enhancement pattern in the midportion of the right lobe of the liver likely related to the underlying biliary ductal dilatation and some change in the degree of per fusion. Gallbladder  is well distended. The changes seen on recent ultrasound are less well appreciated on the current exam. Common bile duct is dilated to 17 mm. This  extends inferiorly to the level of the ampulla. A a somewhat rounded filling defect is noted. This may represent a poorly calcified stone although the possibility of underlying mass lesion would deserve consideration as well. No significant pancreatic ductal dilatation is seen. Spleen, adrenal glands, pancreas and kidneys are otherwise within normal limits. The bladder is well distended. The uterus again show some calcific changes consistent with uterine fibroids. Central decreased attenuation is noted which may also be related to underlying fibroid disease. No pelvic mass lesion or sidewall adenopathy is noted. Mild aortoiliac calcifications are seen without aneurysmal dilatation. The osseous structures show degenerative change of the lumbar spine. No acute compression deformity is noted. IMPRESSION: Significant biliary ductal dilatation with dilatation of the gallbladder. A rounded soft tissue density is noted distally best seen on the coronal imaging. A neoplasm would be difficult to exclude. ERCP is recommended for further evaluation as well as drainage. Changes in the degree of perfusion of the liver likely related to the biliary ductal dilatation. There are changes suggestive of some thrombosis of some of the right portal venous branches best seen on image number 14 of series 2. Chronic changes as described. Electronically Signed   By: Inez Catalina M.D.   On: 01/20/2016 18:29   US Abdomen Limited  01/23/2016  CLINICAL DATA:  Status post retrieval of common bile duct stones by ERCP 01/22/2016, presenting for follow-up. EXAM: US ABDOMEN LIMITED - RIGHT UPPER QUADRANT COMPARISON:  01/20/2016 CT abdomen/ pelvis. 01/22/2016 fluoroscopic ERCP images. FINDINGS: Gallbladder: Gallbladder distention has significantly decreased since 01/20/2016 CT study. Mild diffuse  gallbladder wall thickening. Layering sludge in the gallbladder. No shadowing gallstones are demonstrated in the gallbladder. No pericholecystic fluid or sonographic Murphy's sign. Common bile duct: Diameter: 3 mm Liver: Liver parenchymal echogenicity and echotexture are within normal limits. Scattered pneumobilia is present within the minimally prominent intrahepatic bile ducts, which appear decreased in caliber. No liver mass or definite liver surface irregularity demonstrated. IMPRESSION: 1. Normal common bile duct diameter 3 mm, significantly decreased. Expected post ERCP pneumobilia within the minimally prominent intrahepatic bile ducts, which have decreased in caliber since 01/20/2016. 2. Decreased gallbladder distention. Layering sludge in the gallbladder. No shadowing gallstones demonstrated in the gallbladder. Nonspecific mild diffuse gallbladder wall thickening, probably due to the resolving biliary obstruction. No pericholecystic fluid or sonographic Murphy's sign. 3. Normal liver. Electronically Signed   By: Ilona Sorrel M.D.   On: 01/23/2016 15:31   Dg Ercp With Sphincterotomy  01/22/2016  CLINICAL DATA:  Obstructive jaundice EXAM: ERCP TECHNIQUE: Multiple spot images obtained with the fluoroscopic device and submitted for interpretation post-procedure. FLUOROSCOPY TIME:  2 minutes 36 second COMPARISON:  CT 01/20/2016 FINDINGS: A series of fluoroscopic clips document endoscopic cannulation and opacification of the CBD. CBD dilated. Mobile filling defects in the proximal and mid CBD. Incomplete opacification of the central intrahepatic bile ducts, which appear mildly distended centrally. Endoscopic basket with stone capture. IMPRESSION: 1. Filling defects in dilated CBD, with endoscopic intervention These images were submitted for radiologic interpretation only. Please see the procedural report for the amount of contrast and the fluoroscopy time utilized. Electronically Signed   By: Lucrezia Europe M.D.    On: 01/22/2016 15:52   US Abdomen Limited Ruq  01/20/2016  CLINICAL DATA:  Nausea and vomiting since this morning. EXAM: US ABDOMEN LIMITED - RIGHT UPPER QUADRANT COMPARISON:  CT 05/03/2015 and MRI 05/06/2015 FINDINGS: Gallbladder: Hydropic gallbladder measuring 11.2 cm in length. Moderate gallbladder sludge with several small stones the  largest measuring 3 mm. There is wall thickening measuring up to 9 mm with pericholecystic fluid. Positive sonographic Murphy's sign. Common bile duct: Diameter: 18 mm without evidence of choledocholithiasis. Liver: Center intrahepatic duct dilatation. IMPRESSION: Dilated gallbladder with constellation of findings which may be due to acute cholecystitis. Moderate dilatation of the common bile duct without evidence of ductal stones. Intrahepatic ductal dilatation. Further evaluation with cross-sectional imaging may be helpful to exclude a more distal obstructing process at the level of the head of the pancreas/ampulla. Electronically Signed   By: Marin Olp M.D.   On: 01/20/2016 18:01    Assessment/Plan  #1-suspected thrush Will treat with nystatin swish and swallow 5 mL 4 times a day for 7 days and monitor.  #2 history of weight loss this appears to have moderated with weight most recently 112.6 it appears this will have to be watched-her Lasix as been put on hold-.  #3-history of edema this appears to have moderated I suspect secondary to the Lasix she was previously given-has some left pedal edema with a reduced pedal pulse will check a venous Doppler to rule out DVT.  #4-history of hypokalemia this is being supplemented it was 2.7 earlier this week it did come up to 3.3 with supplementation of 20 meq  twice a day-will updated lab tomorrow will reduce her potassium to 20 mEq a day for now pending lab results tomorrow-appears her potassium approach normalization fairly rapidly-she is no longer on Lasix which I suspect will help her potassium rises well-again will  await update lab tomorrow and make adjustments as indicated  [place], [place], [place]

## 2016-02-14 ENCOUNTER — Encounter (HOSPITAL_COMMUNITY)
Admission: RE | Admit: 2016-02-14 | Discharge: 2016-02-14 | Disposition: A | Discharge status: Home / Self Care | Payer: No Typology Code available for payment source | Source: Skilled Nursing Facility | Attending: Internal Medicine | Admitting: Internal Medicine

## 2016-02-14 DIAGNOSIS — E785 Hyperlipidemia, unspecified: Secondary | ICD-10-CM | POA: Diagnosis not present

## 2016-02-14 DIAGNOSIS — E039 Hypothyroidism, unspecified: Secondary | ICD-10-CM | POA: Diagnosis not present

## 2016-02-14 DIAGNOSIS — N390 Urinary tract infection, site not specified: Secondary | ICD-10-CM | POA: Diagnosis not present

## 2016-02-14 DIAGNOSIS — Z48815 Encounter for surgical aftercare following surgery on the digestive system: Secondary | ICD-10-CM | POA: Diagnosis not present

## 2016-02-14 DIAGNOSIS — I10 Essential (primary) hypertension: Secondary | ICD-10-CM | POA: Diagnosis not present

## 2016-02-14 DIAGNOSIS — R634 Abnormal weight loss: Secondary | ICD-10-CM | POA: Insufficient documentation

## 2016-02-14 DIAGNOSIS — D509 Iron deficiency anemia, unspecified: Secondary | ICD-10-CM | POA: Diagnosis not present

## 2016-02-14 LAB — BASIC METABOLIC PANEL
Anion gap: 5 (ref 5–15)
BUN: 24 mg/dL — AB (ref 6–20)
CHLORIDE: 108 mmol/L (ref 101–111)
CO2: 25 mmol/L (ref 22–32)
Calcium: 8.1 mg/dL — ABNORMAL LOW (ref 8.9–10.3)
Creatinine, Ser: 0.78 mg/dL (ref 0.44–1.00)
GFR calc Af Amer: >60 mL/min (ref >60)
GLUCOSE: 88 mg/dL (ref 65–99)
POTASSIUM: 3.3 mmol/L — AB (ref 3.5–5.1)
Sodium: 138 mmol/L (ref 135–145)

## 2016-02-16 ENCOUNTER — Non-Acute Institutional Stay (SKILLED_NURSING_FACILITY): Payer: Medicare Other | Admitting: Internal Medicine

## 2016-02-16 ENCOUNTER — Encounter: Payer: Self-pay | Admitting: Internal Medicine

## 2016-02-16 DIAGNOSIS — E876 Hypokalemia: Secondary | ICD-10-CM

## 2016-02-16 DIAGNOSIS — R609 Edema, unspecified: Secondary | ICD-10-CM

## 2016-02-16 NOTE — Patient Instructions (Signed)
New orders for Matrix entry. Because of edema discontinue amlodipine. Because of relative hypotension and persistent hypokalemia off furosemide; start spironolactone 25 mg daily and decrease losartan to 100 mg one half daily BMET ;Renin and aldosterone level 6//21

## 2016-02-16 NOTE — Assessment & Plan Note (Signed)
Discontinue amlodipine Spironolactone 25 mg qd Losartan 100 mg one half pill daily Recheck BMET 6/21

## 2016-02-16 NOTE — Assessment & Plan Note (Addendum)
Start spironolactone 25 mg daily Decreased potassium to 20 mEq daily BMET with renin and aldosterone levels 6/21

## 2016-02-16 NOTE — Progress Notes (Signed)
Patient ID: Jo Hardin, female   DOB: December 28, 1921, 82 y.o.   MRN: [id number]    This is a nursing facility follow up for specific acute issue of persistent hypokalemia despite discontinuing the Lasix and supplementing with 20 mEq of potassium twice a day. Additionally she is on losartan which typically is associated with potassium retention.  Interim medical record and care since last [facility] visit was updated with review of diagnostic studies and change in clinical status since last visit were documented.   HPI: Her potassium has varied from 2.7 on 6/14 to the present value of 3.3 despite high dose potassium supplement. Renal function has been normal. She has been on Amlodipine; her family believes that she has had no edema despite being on that chronically until the recent admission. She had been on extremely high doses of vitamin D supplement at 50,000 international units. Vitamin D level was 92.4 prompting change to 1000 international units of vitamin D daily.  Comprehensive review of systems is negative for active cardiopulmonary symptoms; but she has dementia. Her only complaint is "back pain "   Cardiovascular: No chest pain, palpitations,paroxysmal nocturnal dyspnea, claudication, edema  Respiratory: No cough, sputum production,hemoptysis, DOE , significant snoring,apnea   Gastrointestinal: No heartburn,dysphagia,abdominal pain, nausea / vomiting,rectal bleeding, melena,change in bowels Genitourinary: No dysuria,hematuria, pyuria,  incontinence, nocturia  Physical exam:  Pertinent or positive findings: Lacrimal glands are prominent area She is edentulous Patchy alopecia is present. She exhibits a repetitive chewing motion of the mandible. Grade 1 systolic murmur is present at the base. Minor rales are noted in the right lower lobe. She has 1+ pitting edema of the feet and ankles. Pedal pulses are decreased, especially posterior tibial pulses. She has a resting tremor  of the right hand. General appearance:Adequately nourished; no acute distress , increased work of breathing is present.   Lymphatic: No lymphadenopathy about the head, neck, axilla . Eyes: No conjunctival inflammation or lid edema is present. There is no scleral icterus. Neck:  No thyromegaly, masses, tenderness noted.    Heart:  Normal rate and regular rhythm. S1 and S2 normal without gallop,  click, rub .  Abdomen:Bowel sounds are normal. Abdomen is soft and nontender with no organomegaly, hernias,masses. GU: deferred Extremities:  No cyanosis, clubbing  Neurologic exam : Strength equal  in upper & lower extremities but decreased Balance,Rhomberg,finger to nose testing could not be completed due to clinical state Skin: Warm & dry w/o tenting. No significant lesions or rash.    See summary under each active problem in the Problem List with associated updated therapeutic plan

## 2016-02-17 ENCOUNTER — Encounter (HOSPITAL_COMMUNITY)
Admission: RE | Admit: 2016-02-17 | Discharge: 2016-02-17 | Disposition: A | Discharge status: Home / Self Care | Payer: No Typology Code available for payment source | Source: Skilled Nursing Facility | Attending: Internal Medicine | Admitting: Internal Medicine

## 2016-02-17 DIAGNOSIS — D509 Iron deficiency anemia, unspecified: Secondary | ICD-10-CM | POA: Diagnosis not present

## 2016-02-17 DIAGNOSIS — Z48815 Encounter for surgical aftercare following surgery on the digestive system: Secondary | ICD-10-CM | POA: Diagnosis not present

## 2016-02-17 DIAGNOSIS — I10 Essential (primary) hypertension: Secondary | ICD-10-CM | POA: Diagnosis not present

## 2016-02-17 DIAGNOSIS — N390 Urinary tract infection, site not specified: Secondary | ICD-10-CM | POA: Diagnosis not present

## 2016-02-17 DIAGNOSIS — E785 Hyperlipidemia, unspecified: Secondary | ICD-10-CM | POA: Diagnosis not present

## 2016-02-17 DIAGNOSIS — E039 Hypothyroidism, unspecified: Secondary | ICD-10-CM | POA: Diagnosis not present

## 2016-02-17 LAB — BASIC METABOLIC PANEL
ANION GAP: 6 (ref 5–15)
BUN: 16 mg/dL (ref 6–20)
CO2: 28 mmol/L (ref 22–32)
Calcium: 8.4 mg/dL — ABNORMAL LOW (ref 8.9–10.3)
Chloride: 105 mmol/L (ref 101–111)
Creatinine, Ser: 0.65 mg/dL (ref 0.44–1.00)
GFR calc Af Amer: >60 mL/min (ref >60)
GFR calc non Af Amer: >60 mL/min (ref >60)
GLUCOSE: 104 mg/dL — AB (ref 65–99)
POTASSIUM: 4 mmol/L (ref 3.5–5.1)
Sodium: 139 mmol/L (ref 135–145)

## 2016-02-18 ENCOUNTER — Encounter (HOSPITAL_COMMUNITY)
Admission: RE | Admit: 2016-02-18 | Discharge: 2016-02-18 | Disposition: A | Discharge status: Home / Self Care | Payer: No Typology Code available for payment source | Source: Skilled Nursing Facility | Attending: Internal Medicine | Admitting: Internal Medicine

## 2016-02-18 DIAGNOSIS — I10 Essential (primary) hypertension: Secondary | ICD-10-CM | POA: Diagnosis not present

## 2016-02-18 DIAGNOSIS — D509 Iron deficiency anemia, unspecified: Secondary | ICD-10-CM | POA: Diagnosis not present

## 2016-02-18 DIAGNOSIS — N390 Urinary tract infection, site not specified: Secondary | ICD-10-CM | POA: Diagnosis not present

## 2016-02-18 DIAGNOSIS — E785 Hyperlipidemia, unspecified: Secondary | ICD-10-CM | POA: Diagnosis not present

## 2016-02-18 DIAGNOSIS — E039 Hypothyroidism, unspecified: Secondary | ICD-10-CM | POA: Diagnosis not present

## 2016-02-18 DIAGNOSIS — Z48815 Encounter for surgical aftercare following surgery on the digestive system: Secondary | ICD-10-CM | POA: Diagnosis not present

## 2016-02-18 LAB — BASIC METABOLIC PANEL
ANION GAP: 4 — AB (ref 5–15)
BUN: 17 mg/dL (ref 6–20)
CHLORIDE: 105 mmol/L (ref 101–111)
CO2: 28 mmol/L (ref 22–32)
Calcium: 8.2 mg/dL — ABNORMAL LOW (ref 8.9–10.3)
Creatinine, Ser: 0.79 mg/dL (ref 0.44–1.00)
GFR calc Af Amer: >60 mL/min (ref >60)
GFR calc non Af Amer: >60 mL/min (ref >60)
GLUCOSE: 95 mg/dL (ref 65–99)
POTASSIUM: 4.1 mmol/L (ref 3.5–5.1)
Sodium: 137 mmol/L (ref 135–145)

## 2016-02-23 ENCOUNTER — Encounter (HOSPITAL_COMMUNITY)
Admission: RE | Admit: 2016-02-23 | Discharge: 2016-02-23 | Disposition: A | Discharge status: Home / Self Care | Payer: No Typology Code available for payment source | Source: Skilled Nursing Facility | Attending: Internal Medicine | Admitting: Internal Medicine

## 2016-02-23 DIAGNOSIS — E785 Hyperlipidemia, unspecified: Secondary | ICD-10-CM | POA: Diagnosis not present

## 2016-02-23 DIAGNOSIS — Z48815 Encounter for surgical aftercare following surgery on the digestive system: Secondary | ICD-10-CM | POA: Diagnosis not present

## 2016-02-23 DIAGNOSIS — E039 Hypothyroidism, unspecified: Secondary | ICD-10-CM | POA: Diagnosis not present

## 2016-02-23 DIAGNOSIS — I10 Essential (primary) hypertension: Secondary | ICD-10-CM | POA: Diagnosis not present

## 2016-02-23 DIAGNOSIS — D509 Iron deficiency anemia, unspecified: Secondary | ICD-10-CM | POA: Diagnosis not present

## 2016-02-23 DIAGNOSIS — N390 Urinary tract infection, site not specified: Secondary | ICD-10-CM | POA: Diagnosis not present

## 2016-02-23 LAB — BASIC METABOLIC PANEL
ANION GAP: 4 — AB (ref 5–15)
BUN: 24 mg/dL — ABNORMAL HIGH (ref 6–20)
CHLORIDE: 104 mmol/L (ref 101–111)
CO2: 28 mmol/L (ref 22–32)
Calcium: 8.3 mg/dL — ABNORMAL LOW (ref 8.9–10.3)
Creatinine, Ser: 0.81 mg/dL (ref 0.44–1.00)
GFR calc Af Amer: >60 mL/min (ref >60)
GLUCOSE: 89 mg/dL (ref 65–99)
POTASSIUM: 3.8 mmol/L (ref 3.5–5.1)
SODIUM: 136 mmol/L (ref 135–145)

## 2016-02-27 ENCOUNTER — Encounter: Payer: Self-pay | Admitting: Internal Medicine

## 2016-02-27 NOTE — Progress Notes (Signed)
Location:   [facility] Room Number: 157/W Place of Service:  SNF (31) Provider:  [name], MD  Patient Care Team: [name], MD as PCP - General (Internal Medicine)  Extended Emergency Contact Information Primary Emergency Contact: [name] Address: [address]          [place],  [place] Home Phone: [phone] Relation: None Secondary Emergency Contact: [place], [place] [address] [place] Phone: [phone] Relation: Grandson  Code Status:  DNR Goals of care: Advanced Directive information Advanced Directives 02/27/2016  Does patient have an advance directive? Yes  Type of Advance Directive Out of facility DNR (pink MOST or yellow form)  Does patient want to make changes to advanced directive? No - Patient declined  Copy of advanced directive(s) in chart? Yes     Chief Complaint  Patient presents with  . Discharge Note    HPI:  Pt is a 82 y.o. female seen today for an acute visit for    Past Medical History  Diagnosis Date  . Arthritis   . Thyroid disease   . Cancer [facility])     had breast cancer 8 yrs ago  . Hypertension   . Hypomagnesemia   . GERD (gastroesophageal reflux disease)   . Osteoarthritis   . Hypokalemia   . Pancreatitis, acute 05/03/2015  . 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 Anemia   . Bradycardia   . HCAP (healthcare-associated pneumonia)   . Urinary tract infection 02/07/16    Coag negative staph. Nitrofurantoin prescribed    Past Surgical History  Procedure Laterality Date  . Breast surgery    . Esophagogastroduodenoscopy N/A 05/07/2015    Procedure: ESOPHAGOGASTRODUODENOSCOPY (EGD);  Surgeon: Rogene Houston, MD;  Location[facility];  Service: Endoscopy;  Laterality: N/A;  . Ercp N/A 01/22/2016    Procedure: ENDOSCOPIC RETROGRADE CHOLANGIOPANCREATOGRAPHY (ERCP) With biliary sphincterotomy and stone extraction ;  Surgeon:  Rogene Houston, MD;  Location: [facility] ORS;  Service: Endoscopy;  Laterality: N/A;    Allergies  Allergen Reactions  . Codeine Nausea And Vomiting  . Nsaids     Current Outpatient Prescriptions on File Prior to Visit  Medication Sig Dispense Refill  . acetaminophen (TYLENOL) 325 MG tablet Take 650 mg by mouth every 6 (six) hours as needed.    . Amino Acids-Protein Hydrolys (FEEDING SUPPLEMENT, PRO-STAT SUGAR FREE 64,) LIQD ProStat 30 ml TID mix with 4 oz water or juice three times a day between meals    . Balsam Peru-Castor Oil (VENELEX) OINT Apply to sacrum and bilateral buttock every shift and as needed for prevention    . docusate sodium (COLACE) 100 MG capsule Take 100 mg by mouth every morning.    Marland Kitchen ENSURE (ENSURE) Take 1 Can by mouth daily.    Marland Kitchen levothyroxine (SYNTHROID, LEVOTHROID) 125 MCG tablet Take 125 mcg by mouth daily before breakfast.    . omeprazole (PRILOSEC) 20 MG capsule Take 20 mg by mouth daily.    . simvastatin (ZOCOR) 10 MG tablet Take 10 mg by mouth at bedtime.    . traMADol (ULTRAM) 50 MG tablet Take 1 tablet (50 mg total) by mouth every 6 (six) hours as needed for moderate pain or severe pain. 30 tablet 0   No current facility-administered medications on file prior to visit.     Review of Systems  Immunization  History  Administered Date(s) Administered  . Influenza,inj,Quad PF,36+ Mos 05/04/2015   Pertinent  Health Maintenance Due  Topic Date Due  . DEXA SCAN  02/09/2017 (Originally 11/30/1988)  . PNA vac Low Risk Adult (1 of 2 - PCV13) 02/09/2017 (Originally 11/30/1988)  . INFLUENZA VACCINE  03/30/2016   No flowsheet data found. Functional Status Survey:    Filed Vitals:   02/27/16 1407  BP: 120/77  Pulse: 84  Temp: 97.4 F (36.3 C)  TempSrc: Oral  Resp: 18  Height: 5\' 4"  (1.626 m)  Weight: 112 lb 4.8 oz (50.939 kg)   Body mass index is 19.27 kg/(m^2). Physical Exam  Labs reviewed:  Recent Labs  05/07/15 0614  02/17/16 0730 02/18/16 0720  02/23/16 0600  NA 134*  < > 139 137 136  K 2.7*  < > 4.0 4.1 3.8  CL 103  < > 105 105 104  CO2 24  < > 28 28 28   GLUCOSE 99  < > 104* 95 89  BUN 10  < > 16 17 24*  CREATININE 0.67  < > 0.65 0.79 0.81  CALCIUM 7.8*  < > 8.4* 8.2* 8.3*  MG 1.4*  --   --   --   --   < > = values in this interval not displayed.  Recent Labs  01/29/16 1410 02/04/16 0630 02/11/16 0705  AST 13* 15 30  ALT 15 10* 18  ALKPHOS 59 49 43  BILITOT 0.6 0.5 0.8  PROT 6.2* 6.0* 6.1*  ALBUMIN 2.1* 2.1* 2.4*    Recent Labs  01/29/16 1410 02/04/16 0630 02/11/16 0705  WBC 10.5 5.2 4.5  NEUTROABS 7.2 3.4 2.5  HGB 9.9* 9.0* 8.8*  HCT 28.9* 25.9* 26.1*  MCV 82.6 84.1 86.4  PLT 302 475* 291   Lab Results  Component Value Date   TSH 6.247* 01/29/2016   No results found for: HGBA1C Lab Results  Component Value Date   CHOL 98 02/04/2016   HDL 30* 02/04/2016   LDLCALC 55 02/04/2016   TRIG 67 02/04/2016   CHOLHDL 3.3 02/04/2016    Significant Diagnostic Results in last 30 days:  No results found.  Assessment/Plan There are no diagnoses linked to this encounter.      Oralia Manis, [place]

## 2016-02-28 NOTE — Progress Notes (Signed)
This encounter was created in error - please disregard.

## 2016-02-29 LAB — ALDOSTERONE + RENIN ACTIVITY W/ RATIO
ALDO / PRA RATIO: 1.6 (ref 0.0–30.0)
ALDOSTERONE: 1.9 ng/dL (ref 0.0–30.0)
PRA LC/MS/MS: 1.195 ng/mL/hr (ref 0.167–5.380)

## 2016-03-30 ENCOUNTER — Non-Acute Institutional Stay (SKILLED_NURSING_FACILITY): Payer: Medicare Other | Admitting: Internal Medicine

## 2016-03-30 ENCOUNTER — Encounter: Payer: Self-pay | Admitting: Internal Medicine

## 2016-03-30 DIAGNOSIS — I959 Hypotension, unspecified: Secondary | ICD-10-CM

## 2016-03-30 DIAGNOSIS — E038 Other specified hypothyroidism: Secondary | ICD-10-CM

## 2016-03-30 DIAGNOSIS — R4182 Altered mental status, unspecified: Secondary | ICD-10-CM | POA: Diagnosis not present

## 2016-03-30 DIAGNOSIS — R8290 Unspecified abnormal findings in urine: Secondary | ICD-10-CM | POA: Diagnosis not present

## 2016-03-30 NOTE — Progress Notes (Signed)
    Facility Location: [place] Room Number: 157/W  This is a nursing facility follow up for specific acute issue of decreased mental alertness and uriniferous urine according to her daughter.  Interim medical record and care since last [place] visit was updated with review of diagnostic studies and change in clinical status since last visit were documented.  HPI: She states that her mother interacts minimally & has essentially no spontaneous activity or body her limbs. The patient has had acute encephalopathy superimposed on baseline dementia. TSH has been mildly elevated at 6.25 on 01/29/16 despite high dose thyroid replacement. Her blood pressure has been well controlled and in fact she's been somewhat hypotensive on low dose losartan alone. On 02/07/16 she was found have over 100,000 colonies of staph aureus coagulase negative on urine culture. She received 10 days of nitrofurantoin at that time. She has a history of biliary obstruction.  The daughter also questions some discharge from her eyes, described as white.   Physical exam:  Pertinent or positive findings: The patient will look at her daughter when addressed but otherwise has no spontaneous activity. She has worked pattern alopecia with balding anteriorly. Dense arcus senilis is present Edentulous The lacrimal glands are prominent. There is some mucoid discharge medially bilaterally but the conjunctiva are actually pale without any signs of conjunctivitis. There is constant tremor of her mouth as well as a tremor of both upper extremities, right greater than left. She has 2+ pitting edema of the left foot and ankle. The wound on the left foot is dressed. She has 1/2+ pitting edema on the right. She has valgus changes of the knees with fusiform changes. Effusion of the right knee is suggested. He has essentially no range of motion of the extremities. There is a uriniferous odor about her person.  General  appearance:Adequately nourished; no acute distress , increased work of breathing is present.   Lymphatic: No lymphadenopathy about the head, neck, axilla . Eyes: No scleral icterus. Ears:  External ear exam shows no significant lesions or deformities.   Nose:  External nasal examination shows no deformity or inflammation. Nasal mucosa are pink and moist without lesions ,exudates Oral exam: lips and gums are healthy appearing.There is no oropharyngeal erythema or exudate . Neck:  No thyromegaly, masses, tenderness noted.    Heart:  Normal rate and regular rhythm. S1 and S2 normal without gallop, murmur, click, rub .  Lungs:Chest clear to auscultation without wheezes, rhonchi,rales , rubs. Abdomen:Bowel sounds are normal. Abdomen is soft and nontender with no organomegaly, hernias,masses. GU: deferred . Extremities:  No cyanosis, clubbing  Neurologic exam : Balance,Rhomberg,finger to nose testing could not be completed due to clinical state Skin: Warm & dry w/o tenting.   See summary under each active problem in the Problem List with associated updated therapeutic plan

## 2016-03-30 NOTE — Patient Instructions (Signed)
New orders for Matrix entry. CBC and differential  CMET  TSH UA Urine C&S

## 2016-03-30 NOTE — Assessment & Plan Note (Signed)
  CBC and differential  CMET  TSH UA Urine C&S

## 2016-03-30 NOTE — Assessment & Plan Note (Addendum)
CBC Decrease losartan to 25 mg daily

## 2016-03-30 NOTE — Assessment & Plan Note (Signed)
Recheck TSH 

## 2016-03-31 ENCOUNTER — Encounter (HOSPITAL_COMMUNITY)
Admission: RE | Admit: 2016-03-31 | Discharge: 2016-03-31 | Disposition: A | Discharge status: Home / Self Care | Payer: Medicare Other | Source: Skilled Nursing Facility | Attending: Internal Medicine | Admitting: Internal Medicine

## 2016-03-31 DIAGNOSIS — M6281 Muscle weakness (generalized): Secondary | ICD-10-CM | POA: Insufficient documentation

## 2016-03-31 DIAGNOSIS — I10 Essential (primary) hypertension: Secondary | ICD-10-CM | POA: Diagnosis not present

## 2016-03-31 DIAGNOSIS — I959 Hypotension, unspecified: Secondary | ICD-10-CM | POA: Insufficient documentation

## 2016-03-31 DIAGNOSIS — Z48815 Encounter for surgical aftercare following surgery on the digestive system: Secondary | ICD-10-CM | POA: Insufficient documentation

## 2016-03-31 DIAGNOSIS — E785 Hyperlipidemia, unspecified: Secondary | ICD-10-CM | POA: Diagnosis not present

## 2016-03-31 DIAGNOSIS — E876 Hypokalemia: Secondary | ICD-10-CM | POA: Diagnosis not present

## 2016-03-31 DIAGNOSIS — D509 Iron deficiency anemia, unspecified: Secondary | ICD-10-CM | POA: Diagnosis not present

## 2016-03-31 DIAGNOSIS — K219 Gastro-esophageal reflux disease without esophagitis: Secondary | ICD-10-CM | POA: Insufficient documentation

## 2016-03-31 LAB — CBC WITH DIFFERENTIAL/PLATELET
BASOS ABS: 0 10*3/uL (ref 0.0–0.1)
Basophils Relative: 0 %
Eosinophils Absolute: 0 10*3/uL (ref 0.0–0.7)
Eosinophils Relative: 1 %
HEMATOCRIT: 24.6 % — AB (ref 36.0–46.0)
HEMOGLOBIN: 8.4 g/dL — AB (ref 12.0–15.0)
LYMPHS PCT: 23 %
Lymphs Abs: 1.1 10*3/uL (ref 0.7–4.0)
MCH: 29.3 pg (ref 26.0–34.0)
MCHC: 34.1 g/dL (ref 30.0–36.0)
MCV: 85.7 fL (ref 78.0–100.0)
MONO ABS: 0.9 10*3/uL (ref 0.1–1.0)
Monocytes Relative: 19 %
NEUTROS ABS: 2.8 10*3/uL (ref 1.7–7.7)
Neutrophils Relative %: 57 %
Platelets: 327 10*3/uL (ref 150–400)
RBC: 2.87 MIL/uL — ABNORMAL LOW (ref 3.87–5.11)
RDW: 15.8 % — AB (ref 11.5–15.5)
WBC: 4.9 10*3/uL (ref 4.0–10.5)

## 2016-03-31 LAB — COMPREHENSIVE METABOLIC PANEL
ALK PHOS: 40 U/L (ref 38–126)
ALT: 9 U/L — AB (ref 14–54)
AST: 14 U/L — AB (ref 15–41)
Albumin: 2.1 g/dL — ABNORMAL LOW (ref 3.5–5.0)
Anion gap: 5 (ref 5–15)
BILIRUBIN TOTAL: 0.3 mg/dL (ref 0.3–1.2)
BUN: 19 mg/dL (ref 6–20)
CALCIUM: 8.3 mg/dL — AB (ref 8.9–10.3)
CO2: 26 mmol/L (ref 22–32)
CREATININE: 0.66 mg/dL (ref 0.44–1.00)
Chloride: 102 mmol/L (ref 101–111)
GFR calc Af Amer: >60 mL/min (ref >60)
Glucose, Bld: 108 mg/dL — ABNORMAL HIGH (ref 65–99)
Potassium: 4.1 mmol/L (ref 3.5–5.1)
Sodium: 133 mmol/L — ABNORMAL LOW (ref 135–145)
TOTAL PROTEIN: 6.4 g/dL — AB (ref 6.5–8.1)

## 2016-03-31 LAB — URINALYSIS, ROUTINE W REFLEX MICROSCOPIC
BILIRUBIN URINE: NEGATIVE
GLUCOSE, UA: NEGATIVE mg/dL
Ketones, ur: NEGATIVE mg/dL
Nitrite: NEGATIVE
Specific Gravity, Urine: 1.01 (ref 1.005–1.030)
pH: 6 (ref 5.0–8.0)

## 2016-03-31 LAB — URINE MICROSCOPIC-ADD ON

## 2016-04-03 ENCOUNTER — Encounter: Payer: Self-pay | Admitting: Internal Medicine

## 2016-04-03 DIAGNOSIS — N390 Urinary tract infection, site not specified: Secondary | ICD-10-CM

## 2016-04-03 HISTORY — DX: Urinary tract infection, site not specified: N39.0

## 2016-04-03 LAB — URINE CULTURE: Culture: >=100000 — AB

## 2016-04-13 ENCOUNTER — Encounter (HOSPITAL_COMMUNITY)
Admission: RE | Admit: 2016-04-13 | Discharge: 2016-04-13 | Disposition: A | Discharge status: Home / Self Care | Payer: Medicare Other | Source: Skilled Nursing Facility | Attending: Internal Medicine | Admitting: Internal Medicine

## 2016-04-13 DIAGNOSIS — M6281 Muscle weakness (generalized): Secondary | ICD-10-CM | POA: Diagnosis not present

## 2016-04-13 DIAGNOSIS — I10 Essential (primary) hypertension: Secondary | ICD-10-CM | POA: Diagnosis not present

## 2016-04-13 DIAGNOSIS — I959 Hypotension, unspecified: Secondary | ICD-10-CM | POA: Diagnosis not present

## 2016-04-13 DIAGNOSIS — D509 Iron deficiency anemia, unspecified: Secondary | ICD-10-CM | POA: Diagnosis not present

## 2016-04-13 DIAGNOSIS — E876 Hypokalemia: Secondary | ICD-10-CM | POA: Diagnosis not present

## 2016-04-13 DIAGNOSIS — K219 Gastro-esophageal reflux disease without esophagitis: Secondary | ICD-10-CM | POA: Diagnosis not present

## 2016-04-13 LAB — ALBUMIN: Albumin: 2.2 g/dL — ABNORMAL LOW (ref 3.5–5.0)

## 2016-04-13 LAB — TSH: TSH: 8.394 u[IU]/mL — AB (ref 0.350–4.500)

## 2016-04-14 ENCOUNTER — Encounter (HOSPITAL_COMMUNITY)
Admission: RE | Admit: 2016-04-14 | Discharge: 2016-04-14 | Disposition: A | Discharge status: Home / Self Care | Payer: Medicare Other | Source: Skilled Nursing Facility | Attending: Internal Medicine | Admitting: Internal Medicine

## 2016-04-14 DIAGNOSIS — D509 Iron deficiency anemia, unspecified: Secondary | ICD-10-CM | POA: Diagnosis not present

## 2016-04-14 DIAGNOSIS — K219 Gastro-esophageal reflux disease without esophagitis: Secondary | ICD-10-CM | POA: Diagnosis not present

## 2016-04-14 DIAGNOSIS — M6281 Muscle weakness (generalized): Secondary | ICD-10-CM | POA: Diagnosis not present

## 2016-04-14 DIAGNOSIS — I10 Essential (primary) hypertension: Secondary | ICD-10-CM | POA: Diagnosis not present

## 2016-04-14 DIAGNOSIS — E876 Hypokalemia: Secondary | ICD-10-CM | POA: Diagnosis not present

## 2016-04-14 DIAGNOSIS — I959 Hypotension, unspecified: Secondary | ICD-10-CM | POA: Diagnosis not present

## 2016-04-14 LAB — CBC WITH DIFFERENTIAL/PLATELET
BASOS PCT: 1 %
Basophils Absolute: 0.1 10*3/uL (ref 0.0–0.1)
EOS ABS: 0.1 10*3/uL (ref 0.0–0.7)
EOS PCT: 2 %
HEMATOCRIT: 25.4 % — AB (ref 36.0–46.0)
HEMOGLOBIN: 8.3 g/dL — AB (ref 12.0–15.0)
LYMPHS PCT: 34 %
Lymphs Abs: 2.1 10*3/uL (ref 0.7–4.0)
MCH: 28.3 pg (ref 26.0–34.0)
MCHC: 32.7 g/dL (ref 30.0–36.0)
MCV: 86.7 fL (ref 78.0–100.0)
Monocytes Absolute: 1.2 10*3/uL — ABNORMAL HIGH (ref 0.1–1.0)
Monocytes Relative: 20 %
NEUTROS ABS: 2.7 10*3/uL (ref 1.7–7.7)
NEUTROS PCT: 43 %
Platelets: 314 10*3/uL (ref 150–400)
RBC: 2.93 MIL/uL — ABNORMAL LOW (ref 3.87–5.11)
RDW: 15.9 % — ABNORMAL HIGH (ref 11.5–15.5)
WBC: 6.2 10*3/uL (ref 4.0–10.5)

## 2016-04-14 LAB — TSH: TSH: 8.645 u[IU]/mL — ABNORMAL HIGH (ref 0.350–4.500)

## 2016-04-15 ENCOUNTER — Encounter: Payer: Self-pay | Admitting: Internal Medicine

## 2016-04-15 ENCOUNTER — Non-Acute Institutional Stay (SKILLED_NURSING_FACILITY): Payer: Medicare Other | Admitting: Internal Medicine

## 2016-04-15 DIAGNOSIS — D509 Iron deficiency anemia, unspecified: Secondary | ICD-10-CM | POA: Diagnosis not present

## 2016-04-15 DIAGNOSIS — J00 Acute nasopharyngitis [common cold]: Secondary | ICD-10-CM

## 2016-04-15 DIAGNOSIS — E46 Unspecified protein-calorie malnutrition: Secondary | ICD-10-CM

## 2016-04-15 DIAGNOSIS — H109 Unspecified conjunctivitis: Secondary | ICD-10-CM

## 2016-04-15 DIAGNOSIS — E038 Other specified hypothyroidism: Secondary | ICD-10-CM | POA: Diagnosis not present

## 2016-04-15 DIAGNOSIS — E8809 Other disorders of plasma-protein metabolism, not elsewhere classified: Secondary | ICD-10-CM

## 2016-04-15 DIAGNOSIS — F0390 Unspecified dementia without behavioral disturbance: Secondary | ICD-10-CM

## 2016-04-15 NOTE — Progress Notes (Signed)
Location:    [place] Room Number: 157/W Place of Service:  SNF (31) Provider:  Geralyn Flash, MD  Patient Care Team: [name], MD as PCP - General (Internal Medicine)  Extended Emergency Contact Information Primary Emergency Contact: [name] Address: [address]          [place],  [place] Home Phone: [phone] Relation: None Secondary Emergency Contact: [place], [place] [address] [place] Phone: [phone] Relation: Grandson  Code Status:  DNR Goals of care: Advanced Directive information Advanced Directives 04/15/2016  Does patient have an advance directive? Yes  Type of Advance Directive Out of facility DNR (pink MOST or yellow form)  Does patient want to make changes to advanced directive? No - Patient declined  Copy of advanced directive(s) in chart? Yes  Would patient like information on creating an advanced directive? -  Pre-existing out of facility DNR order (yellow form or pink MOST form) -     Chief Complaint  Patient presents with  . Acute Visit    HPI:  Pt is a 82 y.o. female seen today for an acute visit for Anemia. Her last HGB is 8.3. Her Hgb has gradually come down from 10.2 to 8.3. Her Albumin is low at 2.2. Most of the history was obtained from the daughter.She has not noticed her mom to be in any discomfort except some swelling in her right hand after blood draw.  She has also noticed some discharge from her both eyes and some runny nose. She does  Not have any fever chills or cough. Past Medical History:  Diagnosis Date  . Anemia   . Arthritis   . Bradycardia   . Cancer [facility])    had breast cancer 8 yrs ago  . 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 GERD (gastroesophageal reflux disease)   . HCAP (healthcare-associated pneumonia)   . Hypertension   . Hypokalemia   . Hypomagnesemia   . Osteoarthritis   . Pancreatitis, acute  Due to Biliary obstruction 05/03/2015  . Thyroid disease   .  02/07/16     . UTI (lower urinary tract infection) 04/03/2016      Past Surgical History:  Procedure Laterality Date  . BREAST SURGERY    . ERCP N/A 01/22/2016   Procedure: ENDOSCOPIC RETROGRADE CHOLANGIOPANCREATOGRAPHY (ERCP) With biliary sphincterotomy and stone extraction ;  Surgeon: Rogene Houston, MD;  Location: [facility] ORS;  Service: Endoscopy;  Laterality: N/A;  . ESOPHAGOGASTRODUODENOSCOPY N/A 05/07/2015   Procedure: ESOPHAGOGASTRODUODENOSCOPY (EGD);  Surgeon: Rogene Houston, MD;  Location: [facility] ENDO [facility];  Service: Endoscopy;  Laterality: N/A;    Allergies  Allergen Reactions  . Codeine Nausea And Vomiting  . Nsaids     Current Outpatient Prescriptions on File Prior to Visit  Medication Sig Dispense Refill  . acetaminophen (TYLENOL) 325 MG tablet Take 650 mg by mouth every 6 (six) hours as needed.    . Amino Acids-Protein Hydrolys (FEEDING SUPPLEMENT, PRO-STAT SUGAR FREE 64,) LIQD ProStat 30 ml TID mix with 4 oz water or juice three times a day between meals    . Balsam [place]-Castor Oil (VENELEX) OINT Apply to sacrum and bilateral buttock every shift and as needed for prevention    . Cholecalciferol 1000 units TBDP Take 1 tablet by mouth daily    . docusate sodium (COLACE) 100 MG capsule Take 100 mg by  mouth every morning.    Marland Kitchen levothyroxine (SYNTHROID, LEVOTHROID) 125 MCG tablet Take 125 mcg by mouth daily before breakfast.    . losartan (COZAAR) 50 MG tablet Take 50 mg by mouth daily.    . Multiple Vitamins-Minerals (MULTIVITAMIN WITH MINERALS) tablet Take 1 tablet by mouth daily.    Marland Kitchen omeprazole (PRILOSEC) 20 MG capsule Take 20 mg by mouth daily.    . potassium chloride (K-DUR) 10 MEQ tablet Take 10 mEq by mouth daily.    . simvastatin (ZOCOR) 10 MG tablet Take 10 mg by mouth at bedtime.    . traMADol (ULTRAM) 50 MG tablet Take 1 tablet (50 mg total) by mouth every 6 (six) hours as needed for moderate pain or severe  pain. 30 tablet 0   No current facility-administered medications on file prior to visit.      Review of Systems  Constitutional: Negative.   HENT: Positive for drooling and rhinorrhea.   Eyes: Positive for discharge. Negative for pain, redness and itching.  Respiratory: Negative.   Cardiovascular: Negative.     Immunization History  Administered Date(s) Administered  . Influenza,inj,Quad PF,36+ Mos 05/04/2015   Pertinent  Health Maintenance Due  Topic Date Due  . INFLUENZA VACCINE  07/30/2016 (Originally 03/30/2016)  . DEXA SCAN  02/09/2017 (Originally 11/30/1988)  . PNA vac Low Risk Adult (1 of 2 - PCV13) 02/09/2017 (Originally 11/30/1988)   No flowsheet data found. Functional Status Survey:    Vitals:   04/15/16 1502  BP: 136/78  Pulse: 78  Resp: 20  Temp: 98.6 F (37 C)  TempSrc: Oral   There is no height or weight on file to calculate BMI. Physical Exam  Constitutional: She is cooperative. She is easily aroused.  Eyes: Conjunctivae are normal. Right eye exhibits discharge. Left eye exhibits discharge.  Clear discharge  Neck: Neck supple.  Cardiovascular:  Murmur heard. Pulmonary/Chest: Breath sounds normal. No respiratory distress. She has no wheezes.  Abdominal: Soft. Bowel sounds are normal.  Musculoskeletal: She exhibits no edema.  Neurological: She is alert and easily aroused.  Not oriented to Time place or person Some swelling in Right hand not red or tender.Moving her fingers well.    Labs reviewed:  Recent Labs  05/07/15 0614  02/18/16 0720 02/23/16 0600 03/31/16 0655  NA 134*  < > 137 136 133*  K 2.7*  < > 4.1 3.8 4.1  CL 103  < > 105 104 102  CO2 24  < > 28 28 26   GLUCOSE 99  < > 95 89 108*  BUN 10  < > 17 24* 19  CREATININE 0.67  < > 0.79 0.81 0.66  CALCIUM 7.8*  < > 8.2* 8.3* 8.3*  MG 1.4*  --   --   --   --   < > = values in this interval not displayed.  Recent Labs  02/04/16 0630 02/11/16 0705 03/31/16 0655 04/13/16 0720  AST 15  30 14*  --   ALT 10* 18 9*  --   ALKPHOS 49 43 40  --   BILITOT 0.5 0.8 0.3  --   PROT 6.0* 6.1* 6.4*  --   ALBUMIN 2.1* 2.4* 2.1* 2.2*    Recent Labs  02/11/16 0705 03/31/16 0655 04/14/16 0720  WBC 4.5 4.9 6.2  NEUTROABS 2.5 2.8 2.7  HGB 8.8* 8.4* 8.3*  HCT 26.1* 24.6* 25.4*  MCV 86.4 85.7 86.7  PLT 291 327 314   Lab Results  Component Value Date  TSH 8.645 (H) 04/14/2016   No results found for: HGBA1C Lab Results  Component Value Date   CHOL 98 02/04/2016   HDL 30 (L) 02/04/2016   LDLCALC 55 02/04/2016   TRIG 67 02/04/2016   CHOLHDL 3.3 02/04/2016    Significant Diagnostic Results in last 30 days:  No results found.  Assessment/Plan  Anemia  Patient has Hgb of 8.3 . She has been recently started on iron supplements. Her weight has actually increased over past one month. Her last Iron studies were in 09/16 and ferritin was low 92 with iron 6. D/W daughter that she is not candidate for anything aggressive right now. Will continue iron supplement. Check CBC in 2 weeks. Do stool cards.Contiue omeprazole.   Hypothyroidism  TSH was 8.645. Increased Synthroid to 150 mcg Qd. Repeat TSH in 6 weeks.  Swelling of hand due to blood draw  Will monitor closely and consider X Ray if not better by Mon.  Allergis Rhinitis  Started on Claritin QD  Conjunctivitis   Start on Cipro Eye drops for 1 week.  Hypoalbuminemia  Nutrition consult Told daughter to encourage PO Feeds and supplement.    Family/ staff Communication: D/W daughter and Dietician Patient prognosis stays poor due to her age and multiple comorbidity.  Labs/tests ordered:   Repeat CBC in 2 weeks Stool cards TSH in 6 weeks.

## 2016-04-29 ENCOUNTER — Encounter (HOSPITAL_COMMUNITY)
Admission: RE | Admit: 2016-04-29 | Discharge: 2016-04-29 | Disposition: A | Discharge status: Home / Self Care | Payer: Medicare Other | Source: Skilled Nursing Facility | Attending: *Deleted | Admitting: *Deleted

## 2016-04-29 ENCOUNTER — Other Ambulatory Visit (HOSPITAL_COMMUNITY): Admission: AD | Admit: 2016-04-29 | Payer: Medicare Other | Source: Skilled Nursing Facility | Admitting: *Deleted

## 2016-04-29 DIAGNOSIS — M6281 Muscle weakness (generalized): Secondary | ICD-10-CM | POA: Diagnosis not present

## 2016-04-29 DIAGNOSIS — K219 Gastro-esophageal reflux disease without esophagitis: Secondary | ICD-10-CM | POA: Diagnosis not present

## 2016-04-29 DIAGNOSIS — E876 Hypokalemia: Secondary | ICD-10-CM | POA: Diagnosis not present

## 2016-04-29 DIAGNOSIS — D509 Iron deficiency anemia, unspecified: Secondary | ICD-10-CM | POA: Diagnosis not present

## 2016-04-29 DIAGNOSIS — I10 Essential (primary) hypertension: Secondary | ICD-10-CM | POA: Diagnosis not present

## 2016-04-29 DIAGNOSIS — I959 Hypotension, unspecified: Secondary | ICD-10-CM | POA: Diagnosis not present

## 2016-04-29 LAB — CBC WITH DIFFERENTIAL/PLATELET
BASOS PCT: 1 %
Basophils Absolute: 0 10*3/uL (ref 0.0–0.1)
EOS ABS: 0.1 10*3/uL (ref 0.0–0.7)
EOS PCT: 1 %
HCT: 25.1 % — ABNORMAL LOW (ref 36.0–46.0)
HEMOGLOBIN: 8.4 g/dL — AB (ref 12.0–15.0)
Lymphocytes Relative: 34 %
Lymphs Abs: 1.9 10*3/uL (ref 0.7–4.0)
MCH: 29.2 pg (ref 26.0–34.0)
MCHC: 33.5 g/dL (ref 30.0–36.0)
MCV: 87.2 fL (ref 78.0–100.0)
MONOS PCT: 14 %
Monocytes Absolute: 0.8 10*3/uL (ref 0.1–1.0)
NEUTROS PCT: 50 %
Neutro Abs: 2.8 10*3/uL (ref 1.7–7.7)
PLATELETS: 337 10*3/uL (ref 150–400)
RBC: 2.88 MIL/uL — ABNORMAL LOW (ref 3.87–5.11)
RDW: 16.1 % — ABNORMAL HIGH (ref 11.5–15.5)
WBC: 5.6 10*3/uL (ref 4.0–10.5)

## 2016-05-04 DIAGNOSIS — Z9181 History of falling: Secondary | ICD-10-CM | POA: Diagnosis not present

## 2016-05-04 DIAGNOSIS — I4891 Unspecified atrial fibrillation: Secondary | ICD-10-CM | POA: Diagnosis not present

## 2016-05-04 DIAGNOSIS — Z48815 Encounter for surgical aftercare following surgery on the digestive system: Secondary | ICD-10-CM | POA: Diagnosis not present

## 2016-05-04 DIAGNOSIS — M6281 Muscle weakness (generalized): Secondary | ICD-10-CM | POA: Diagnosis not present

## 2016-05-04 DIAGNOSIS — R279 Unspecified lack of coordination: Secondary | ICD-10-CM | POA: Diagnosis not present

## 2016-05-05 DIAGNOSIS — Z48815 Encounter for surgical aftercare following surgery on the digestive system: Secondary | ICD-10-CM | POA: Diagnosis not present

## 2016-05-05 DIAGNOSIS — M6281 Muscle weakness (generalized): Secondary | ICD-10-CM | POA: Diagnosis not present

## 2016-05-05 DIAGNOSIS — I4891 Unspecified atrial fibrillation: Secondary | ICD-10-CM | POA: Diagnosis not present

## 2016-05-05 DIAGNOSIS — R279 Unspecified lack of coordination: Secondary | ICD-10-CM | POA: Diagnosis not present

## 2016-05-05 DIAGNOSIS — Z9181 History of falling: Secondary | ICD-10-CM | POA: Diagnosis not present

## 2016-05-06 DIAGNOSIS — M6281 Muscle weakness (generalized): Secondary | ICD-10-CM | POA: Diagnosis not present

## 2016-05-06 DIAGNOSIS — R279 Unspecified lack of coordination: Secondary | ICD-10-CM | POA: Diagnosis not present

## 2016-05-06 DIAGNOSIS — Z48815 Encounter for surgical aftercare following surgery on the digestive system: Secondary | ICD-10-CM | POA: Diagnosis not present

## 2016-05-06 DIAGNOSIS — I4891 Unspecified atrial fibrillation: Secondary | ICD-10-CM | POA: Diagnosis not present

## 2016-05-06 DIAGNOSIS — Z9181 History of falling: Secondary | ICD-10-CM | POA: Diagnosis not present

## 2016-05-07 DIAGNOSIS — Z9181 History of falling: Secondary | ICD-10-CM | POA: Diagnosis not present

## 2016-05-07 DIAGNOSIS — R279 Unspecified lack of coordination: Secondary | ICD-10-CM | POA: Diagnosis not present

## 2016-05-07 DIAGNOSIS — Z48815 Encounter for surgical aftercare following surgery on the digestive system: Secondary | ICD-10-CM | POA: Diagnosis not present

## 2016-05-07 DIAGNOSIS — M6281 Muscle weakness (generalized): Secondary | ICD-10-CM | POA: Diagnosis not present

## 2016-05-07 DIAGNOSIS — I4891 Unspecified atrial fibrillation: Secondary | ICD-10-CM | POA: Diagnosis not present

## 2016-05-10 DIAGNOSIS — I4891 Unspecified atrial fibrillation: Secondary | ICD-10-CM | POA: Diagnosis not present

## 2016-05-10 DIAGNOSIS — R279 Unspecified lack of coordination: Secondary | ICD-10-CM | POA: Diagnosis not present

## 2016-05-10 DIAGNOSIS — M6281 Muscle weakness (generalized): Secondary | ICD-10-CM | POA: Diagnosis not present

## 2016-05-10 DIAGNOSIS — Z48815 Encounter for surgical aftercare following surgery on the digestive system: Secondary | ICD-10-CM | POA: Diagnosis not present

## 2016-05-10 DIAGNOSIS — Z9181 History of falling: Secondary | ICD-10-CM | POA: Diagnosis not present

## 2016-05-11 DIAGNOSIS — M6281 Muscle weakness (generalized): Secondary | ICD-10-CM | POA: Diagnosis not present

## 2016-05-11 DIAGNOSIS — Z48815 Encounter for surgical aftercare following surgery on the digestive system: Secondary | ICD-10-CM | POA: Diagnosis not present

## 2016-05-11 DIAGNOSIS — I4891 Unspecified atrial fibrillation: Secondary | ICD-10-CM | POA: Diagnosis not present

## 2016-05-11 DIAGNOSIS — R279 Unspecified lack of coordination: Secondary | ICD-10-CM | POA: Diagnosis not present

## 2016-05-11 DIAGNOSIS — Z9181 History of falling: Secondary | ICD-10-CM | POA: Diagnosis not present

## 2016-05-12 ENCOUNTER — Other Ambulatory Visit (HOSPITAL_COMMUNITY)
Admission: RE | Admit: 2016-05-12 | Discharge: 2016-05-12 | Disposition: A | Discharge status: Home / Self Care | Payer: Medicare Other | Source: Skilled Nursing Facility | Attending: Internal Medicine | Admitting: Internal Medicine

## 2016-05-12 DIAGNOSIS — M6281 Muscle weakness (generalized): Secondary | ICD-10-CM | POA: Diagnosis not present

## 2016-05-12 DIAGNOSIS — K921 Melena: Secondary | ICD-10-CM | POA: Insufficient documentation

## 2016-05-12 DIAGNOSIS — Z48815 Encounter for surgical aftercare following surgery on the digestive system: Secondary | ICD-10-CM | POA: Diagnosis not present

## 2016-05-12 DIAGNOSIS — Z9181 History of falling: Secondary | ICD-10-CM | POA: Diagnosis not present

## 2016-05-12 DIAGNOSIS — R279 Unspecified lack of coordination: Secondary | ICD-10-CM | POA: Diagnosis not present

## 2016-05-12 DIAGNOSIS — I4891 Unspecified atrial fibrillation: Secondary | ICD-10-CM | POA: Diagnosis not present

## 2016-05-12 LAB — OCCULT BLOOD X 1 CARD TO LAB, STOOL: Fecal Occult Bld: NEGATIVE

## 2016-05-18 DIAGNOSIS — Z9181 History of falling: Secondary | ICD-10-CM | POA: Diagnosis not present

## 2016-05-18 DIAGNOSIS — Z48815 Encounter for surgical aftercare following surgery on the digestive system: Secondary | ICD-10-CM | POA: Diagnosis not present

## 2016-05-18 DIAGNOSIS — M6281 Muscle weakness (generalized): Secondary | ICD-10-CM | POA: Diagnosis not present

## 2016-05-18 DIAGNOSIS — I4891 Unspecified atrial fibrillation: Secondary | ICD-10-CM | POA: Diagnosis not present

## 2016-05-18 DIAGNOSIS — R279 Unspecified lack of coordination: Secondary | ICD-10-CM | POA: Diagnosis not present

## 2016-05-19 ENCOUNTER — Other Ambulatory Visit (HOSPITAL_COMMUNITY)
Admission: RE | Admit: 2016-05-19 | Discharge: 2016-05-19 | Disposition: A | Discharge status: Home / Self Care | Payer: Medicare Other | Source: Skilled Nursing Facility | Attending: Internal Medicine | Admitting: Internal Medicine

## 2016-05-19 DIAGNOSIS — K921 Melena: Secondary | ICD-10-CM | POA: Diagnosis not present

## 2016-05-19 DIAGNOSIS — Z48815 Encounter for surgical aftercare following surgery on the digestive system: Secondary | ICD-10-CM | POA: Diagnosis not present

## 2016-05-19 DIAGNOSIS — I4891 Unspecified atrial fibrillation: Secondary | ICD-10-CM | POA: Diagnosis not present

## 2016-05-19 DIAGNOSIS — M6281 Muscle weakness (generalized): Secondary | ICD-10-CM | POA: Diagnosis not present

## 2016-05-19 DIAGNOSIS — R279 Unspecified lack of coordination: Secondary | ICD-10-CM | POA: Diagnosis not present

## 2016-05-19 DIAGNOSIS — Z9181 History of falling: Secondary | ICD-10-CM | POA: Diagnosis not present

## 2016-05-19 LAB — OCCULT BLOOD X 1 CARD TO LAB, STOOL: FECAL OCCULT BLD: NEGATIVE

## 2016-05-20 DIAGNOSIS — Z9181 History of falling: Secondary | ICD-10-CM | POA: Diagnosis not present

## 2016-05-20 DIAGNOSIS — R279 Unspecified lack of coordination: Secondary | ICD-10-CM | POA: Diagnosis not present

## 2016-05-20 DIAGNOSIS — I4891 Unspecified atrial fibrillation: Secondary | ICD-10-CM | POA: Diagnosis not present

## 2016-05-20 DIAGNOSIS — Z48815 Encounter for surgical aftercare following surgery on the digestive system: Secondary | ICD-10-CM | POA: Diagnosis not present

## 2016-05-20 DIAGNOSIS — M6281 Muscle weakness (generalized): Secondary | ICD-10-CM | POA: Diagnosis not present

## 2016-05-21 ENCOUNTER — Encounter (HOSPITAL_COMMUNITY)
Admission: RE | Admit: 2016-05-21 | Discharge: 2016-05-21 | Disposition: A | Discharge status: Home / Self Care | Payer: Medicare Other | Source: Skilled Nursing Facility | Attending: Internal Medicine | Admitting: Internal Medicine

## 2016-05-21 DIAGNOSIS — R262 Difficulty in walking, not elsewhere classified: Secondary | ICD-10-CM | POA: Insufficient documentation

## 2016-05-21 DIAGNOSIS — E039 Hypothyroidism, unspecified: Secondary | ICD-10-CM | POA: Insufficient documentation

## 2016-05-21 DIAGNOSIS — R279 Unspecified lack of coordination: Secondary | ICD-10-CM | POA: Diagnosis not present

## 2016-05-21 DIAGNOSIS — I4891 Unspecified atrial fibrillation: Secondary | ICD-10-CM | POA: Diagnosis not present

## 2016-05-21 DIAGNOSIS — Z48815 Encounter for surgical aftercare following surgery on the digestive system: Secondary | ICD-10-CM | POA: Diagnosis not present

## 2016-05-21 DIAGNOSIS — D509 Iron deficiency anemia, unspecified: Secondary | ICD-10-CM | POA: Insufficient documentation

## 2016-05-21 DIAGNOSIS — E785 Hyperlipidemia, unspecified: Secondary | ICD-10-CM | POA: Insufficient documentation

## 2016-05-21 DIAGNOSIS — I10 Essential (primary) hypertension: Secondary | ICD-10-CM | POA: Diagnosis not present

## 2016-05-21 DIAGNOSIS — N390 Urinary tract infection, site not specified: Secondary | ICD-10-CM | POA: Insufficient documentation

## 2016-05-21 DIAGNOSIS — R17 Unspecified jaundice: Secondary | ICD-10-CM | POA: Diagnosis not present

## 2016-05-21 DIAGNOSIS — Z9181 History of falling: Secondary | ICD-10-CM | POA: Diagnosis not present

## 2016-05-21 DIAGNOSIS — M6281 Muscle weakness (generalized): Secondary | ICD-10-CM | POA: Diagnosis not present

## 2016-05-21 LAB — CBC WITH DIFFERENTIAL/PLATELET
BASOS ABS: 0 10*3/uL (ref 0.0–0.1)
BASOS PCT: 0 %
Eosinophils Absolute: 0.1 10*3/uL (ref 0.0–0.7)
Eosinophils Relative: 2 %
HEMATOCRIT: 28.2 % — AB (ref 36.0–46.0)
Hemoglobin: 8.9 g/dL — ABNORMAL LOW (ref 12.0–15.0)
LYMPHS PCT: 31 %
Lymphs Abs: 1.7 10*3/uL (ref 0.7–4.0)
MCH: 28.3 pg (ref 26.0–34.0)
MCHC: 31.6 g/dL (ref 30.0–36.0)
MCV: 89.8 fL (ref 78.0–100.0)
MONO ABS: 0.7 10*3/uL (ref 0.1–1.0)
MONOS PCT: 13 %
NEUTROS ABS: 3 10*3/uL (ref 1.7–7.7)
NEUTROS PCT: 54 %
Platelets: 368 10*3/uL (ref 150–400)
RBC: 3.14 MIL/uL — ABNORMAL LOW (ref 3.87–5.11)
RDW: 16.7 % — AB (ref 11.5–15.5)
WBC: 5.6 10*3/uL (ref 4.0–10.5)

## 2016-05-21 LAB — TSH: TSH: 6.116 u[IU]/mL — ABNORMAL HIGH (ref 0.350–4.500)

## 2016-05-24 DIAGNOSIS — M6281 Muscle weakness (generalized): Secondary | ICD-10-CM | POA: Diagnosis not present

## 2016-05-24 DIAGNOSIS — Z9181 History of falling: Secondary | ICD-10-CM | POA: Diagnosis not present

## 2016-05-24 DIAGNOSIS — R279 Unspecified lack of coordination: Secondary | ICD-10-CM | POA: Diagnosis not present

## 2016-05-24 DIAGNOSIS — I4891 Unspecified atrial fibrillation: Secondary | ICD-10-CM | POA: Diagnosis not present

## 2016-05-24 DIAGNOSIS — Z48815 Encounter for surgical aftercare following surgery on the digestive system: Secondary | ICD-10-CM | POA: Diagnosis not present

## 2016-05-25 DIAGNOSIS — M6281 Muscle weakness (generalized): Secondary | ICD-10-CM | POA: Diagnosis not present

## 2016-05-25 DIAGNOSIS — I4891 Unspecified atrial fibrillation: Secondary | ICD-10-CM | POA: Diagnosis not present

## 2016-05-25 DIAGNOSIS — Z9181 History of falling: Secondary | ICD-10-CM | POA: Diagnosis not present

## 2016-05-25 DIAGNOSIS — R279 Unspecified lack of coordination: Secondary | ICD-10-CM | POA: Diagnosis not present

## 2016-05-25 DIAGNOSIS — Z48815 Encounter for surgical aftercare following surgery on the digestive system: Secondary | ICD-10-CM | POA: Diagnosis not present

## 2016-05-31 ENCOUNTER — Encounter: Payer: Self-pay | Admitting: Internal Medicine

## 2016-05-31 ENCOUNTER — Non-Acute Institutional Stay (SKILLED_NURSING_FACILITY): Payer: Medicare Other | Admitting: Internal Medicine

## 2016-05-31 DIAGNOSIS — H04123 Dry eye syndrome of bilateral lacrimal glands: Secondary | ICD-10-CM

## 2016-05-31 DIAGNOSIS — D509 Iron deficiency anemia, unspecified: Secondary | ICD-10-CM | POA: Diagnosis not present

## 2016-05-31 DIAGNOSIS — E039 Hypothyroidism, unspecified: Secondary | ICD-10-CM | POA: Diagnosis not present

## 2016-05-31 NOTE — Progress Notes (Signed)
Location:   [place] Room Number: 157/W Place of Service:  SNF (31) Provider:  Geralyn Flash, MD  Patient Care Team: [name], MD as PCP - General (Internal Medicine)  Extended Emergency Contact Information Primary Emergency Contact: [name] Address: [address]          [place],  [place] Home Phone: [phone] Relation: None Secondary Emergency Contact: [place], [place] [address] [place] Phone: [phone] Relation: Grandson  Code Status:  DNR Goals of care: Advanced Directive information Advanced Directives 05/31/2016  Does patient have an advance directive? Yes  Type of Advance Directive Out of facility DNR (pink MOST or yellow form)  Does patient want to make changes to advanced directive? No - Patient declined  Copy of advanced directive(s) in chart? Yes  Would patient like information on creating an advanced directive? -  Pre-existing out of facility DNR order (yellow form or pink MOST form) -     Chief Complaint  Patient presents with  . Acute Visit    Dry eyes    HPI:  Pt is a 82 y.o. female seen today for an acute visit for Dry Eyes. It is chronic problem for Patient as she does not close her eyes properly. Unable to get history from the patient.Patient also had elevated TSH on follow up. His Synthroid was increased on 08/17   Past Medical History:  Diagnosis Date  . Anemia   . Arthritis   . Bradycardia   . Cancer [facility])    had breast cancer 8 yrs ago  . 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 GERD (gastroesophageal reflux disease)   . HCAP (healthcare-associated pneumonia)   . Hypertension   . Hypokalemia   . Hypomagnesemia   . Osteoarthritis   . Pancreatitis, acute 05/03/2015  . Thyroid disease   . Urinary tract infection 02/07/16   Coag negative staph. Nitrofurantoin prescribed   . UTI (lower urinary tract infection) 04/03/2016   Proteus treated with Septra DS   Past Surgical History:  Procedure Laterality Date  . BREAST SURGERY    . ERCP N/A 01/22/2016   Procedure: ENDOSCOPIC RETROGRADE CHOLANGIOPANCREATOGRAPHY (ERCP) With biliary sphincterotomy and stone extraction ;  Surgeon: Rogene Houston, MD;  Location: [facility];  Service: Endoscopy;  Laterality: N/A;  . ESOPHAGOGASTRODUODENOSCOPY N/A 05/07/2015   Procedure: ESOPHAGOGASTRODUODENOSCOPY (EGD);  Surgeon: Rogene Houston, MD;  Location: [facility];  Service: Endoscopy;  Laterality: N/A;    Allergies  Allergen Reactions  . Codeine Nausea And Vomiting  . Nsaids     Current Outpatient Prescriptions on File Prior to Visit  Medication Sig Dispense Refill  . acetaminophen (TYLENOL) 325 MG tablet Take 650 mg by mouth every 6 (six) hours as needed.    . Amino Acids-Protein Hydrolys (FEEDING SUPPLEMENT, PRO-STAT SUGAR FREE 64,) LIQD ProStat 30 ml TID mix with 4 oz water or juice three times a day between meals    . Balsam [place]-Castor Oil (VENELEX) OINT Apply to sacrum and bilateral buttock every shift and as needed for prevention    . Cholecalciferol 1000 units TBDP Take 1 tablet by mouth daily    . docusate sodium (COLACE) 100 MG capsule Take 100 mg by mouth every morning.    . ferrous sulfate (FERROUSUL) 325 (65 FE) MG tablet Take 325 mg by mouth daily with breakfast.    . losartan (  COZAAR) 50 MG tablet Take 50 mg by mouth daily.    . Multiple Vitamins-Minerals (MULTIVITAMIN WITH MINERALS) tablet Take 1 tablet by mouth daily.    Marland Kitchen omeprazole (PRILOSEC) 20 MG capsule Take 20 mg by mouth daily.    . potassium chloride (K-DUR) 10 MEQ tablet Take 10 mEq by mouth daily.    . simvastatin (ZOCOR) 10 MG tablet Take 10 mg by mouth at bedtime.    . traMADol (ULTRAM) 50 MG tablet Take 1 tablet (50 mg total) by mouth every 6 (six) hours as needed for moderate pain or severe pain. 30 tablet 0   No current facility-administered medications on file prior to visit.      Review  of Systems  Unable to perform ROS: Dementia    Immunization History  Administered Date(s) Administered  . Influenza,inj,Quad PF,36+ Mos 05/04/2015   Pertinent  Health Maintenance Due  Topic Date Due  . INFLUENZA VACCINE  07/30/2016 (Originally 03/30/2016)  . DEXA SCAN  02/09/2017 (Originally 11/30/1988)  . PNA vac Low Risk Adult (1 of 2 - PCV13) 02/09/2017 (Originally 11/30/1988)   No flowsheet data found. Functional Status Survey:    Vitals:   05/31/16 1317  BP:  120/73  Pulse: 82  Resp: 20  Temp: 98.3 F (36.8 C)  TempSrc: Oral   There is no height or weight on file to calculate BMI. Physical Exam  Constitutional: She appears well-developed.  HENT:  Head: Normocephalic.  Mouth/Throat: Oropharynx is clear and moist.  Eyes:  Patient does have dry eyes with no redness or discharge.  Cardiovascular: Normal rate, regular rhythm and normal heart sounds.   Pulmonary/Chest: Effort normal and breath sounds normal. No respiratory distress. She has no wheezes. She has no rales.  Abdominal: Soft. Bowel sounds are normal. She exhibits no distension. There is no tenderness. There is no rebound.  Musculoskeletal: She exhibits edema.  Neurological: She is alert.    Labs reviewed:  Recent Labs  02/18/16 0720 02/23/16 0600 03/31/16 0655  NA 137 136 133*  K 4.1 3.8 4.1  CL 105 104 102  CO2 28 28 26   GLUCOSE 95 89 108*  BUN 17 24* 19  CREATININE 0.79 0.81 0.66  CALCIUM 8.2* 8.3* 8.3*    Recent Labs  02/04/16 0630 02/11/16 0705 03/31/16 0655 04/13/16 0720  AST 15 30 14*  --   ALT 10* 18 9*  --   ALKPHOS 49 43 40  --   BILITOT 0.5 0.8 0.3  --   PROT 6.0* 6.1* 6.4*  --   ALBUMIN 2.1* 2.4* 2.1* 2.2*    Recent Labs  04/14/16 0720 04/29/16 0800 05/21/16 1200  WBC 6.2 5.6 5.6  NEUTROABS 2.7 2.8 3.0  HGB 8.3* 8.4* 8.9*  HCT 25.4* 25.1* 28.2*  MCV 86.7 87.2 89.8  PLT 314 337 368   Lab Results  Component Value Date   TSH 6.116 (H) 05/21/2016   No results found  for: HGBA1C Lab Results  Component Value Date   CHOL 98 02/04/2016   HDL 30 (L) 02/04/2016   LDLCALC 55 02/04/2016   TRIG 67 02/04/2016   CHOLHDL 3.3 02/04/2016    Significant Diagnostic Results in last 30 days:  No results found.  Assessment/Plan  Dry eyes    Will start her on Artificial tears.  Anemia Hgb stable on Iron supplement. No more Work up necessary. Heme occult is negative.  Hypothyroid Increase Synthroid to 165 mcg. Repeat TSH in 6 weeks.   Family/ staff Communication:  Labs/tests ordered:

## 2016-06-08 DIAGNOSIS — H2513 Age-related nuclear cataract, bilateral: Secondary | ICD-10-CM | POA: Diagnosis not present

## 2016-06-08 DIAGNOSIS — H40023 Open angle with borderline findings, high risk, bilateral: Secondary | ICD-10-CM | POA: Diagnosis not present

## 2016-06-09 ENCOUNTER — Non-Acute Institutional Stay (SKILLED_NURSING_FACILITY): Payer: Medicare Other | Admitting: Internal Medicine

## 2016-06-09 ENCOUNTER — Encounter: Payer: Self-pay | Admitting: Internal Medicine

## 2016-06-09 DIAGNOSIS — E559 Vitamin D deficiency, unspecified: Secondary | ICD-10-CM

## 2016-06-09 DIAGNOSIS — K831 Obstruction of bile duct: Secondary | ICD-10-CM

## 2016-06-09 DIAGNOSIS — D509 Iron deficiency anemia, unspecified: Secondary | ICD-10-CM

## 2016-06-09 DIAGNOSIS — J3489 Other specified disorders of nose and nasal sinuses: Secondary | ICD-10-CM

## 2016-06-09 DIAGNOSIS — E876 Hypokalemia: Secondary | ICD-10-CM

## 2016-06-09 DIAGNOSIS — E039 Hypothyroidism, unspecified: Secondary | ICD-10-CM

## 2016-06-09 DIAGNOSIS — G309 Alzheimer's disease, unspecified: Secondary | ICD-10-CM

## 2016-06-09 DIAGNOSIS — F0280 Dementia in other diseases classified elsewhere without behavioral disturbance: Secondary | ICD-10-CM

## 2016-06-09 NOTE — Progress Notes (Signed)
Location:   [place] Room Number: 157//W Place of Service:  SNF (31) Provider:  [name], MD  Patient Care Team: [name], MD as PCP - General (Internal Medicine)  Extended Emergency Contact Information Primary Emergency Contact: Jo Hardin Address: [address]          [place],  [place] Home Phone: [phone] Relation: None Secondary Emergency Contact: [place], [place] [address] [place] Phone: [phone] Relation: Grandson  Code Status:  DNR Goals of care: Advanced Directive information Advanced Directives 06/09/2016  Does patient have an advance directive? Yes  Type of Advance Directive Out of facility DNR (pink MOST or yellow form)  Does patient want to make changes to advanced directive? No - Patient declined  Copy of advanced directive(s) in chart? Yes  Would patient like information on creating an advanced directive? -  Pre-existing out of facility DNR order (yellow form or pink MOST form) -     Chief Complaint  Patient presents with  . Medical Management of Chronic Issues    Routine Visit  Medical management of chronic medical issues including Anemia-hypothyroidism-hypertension-hypokalemia-dementia-weight loss-history of elevated liver function tests with history of biliary obstruction.    HPI:  Pt is a 82 y.o. female seen today for medical management of chronic diseases.  As noted above-she actually is enjoying a period of stability most recently she's been seen more for eye issues including suspected conjunctivitis which was treated with an antibiotic eyedrop more recently dry eyes treated with artificial tears this appears to be stable she is having a runny nose and appears she's responded well to a dose of Claritin for this previously.  Weight loss at one point was a significant issue but she is gradually gaining weight and according to family and staff she is  eating significantly better-current weight of 121.6 is again of about 10 pounds since Jo Hardin.  She does have a history of anemia not a candidate for aggressive workup she's been started on iron and this appears to be trending up most recently 8.9 on lab done in [place] had been 8.3 previously.  She does have a history hypothyroidism Synthroid was recently increased secondary to a TSH that was mildly elevated at 6.11 updated TSH is pending.  She was initially admitted here in [place] 2017 after hospitalization for biliary obstruction with jaundice and significant elevated liver function tests she did receive an EEG D an ERCP as well as sphinectomy  her liver function tests have largely normalized almost recent lab done in August we will update this.  She is not complaining of any abdominal pain nausea or vomiting again her appetite has improved significantly.  She is currently sitting in her wheelchair comfortably family is at bedside and state from what they see she is doing quite well.  She has had a history of hyperkalemia per chart review with potassium as low as 2.7 this has been supplemented potassium on lab done in August was 4.1 we will update this as well.     Past Medical History:  Diagnosis Date  . Anemia   . Arthritis   . Bradycardia   . Cancer [facility])    had breast cancer 8 yrs ago  . 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 GERD (gastroesophageal reflux disease)   . HCAP (healthcare-associated pneumonia)   . Hypertension   . Hypokalemia   .  Hypomagnesemia   . Osteoarthritis   . Pancreatitis, acute 05/03/2015  . Thyroid disease   . Urinary tract infection 02/07/16   Coag negative staph. Nitrofurantoin prescribed   . UTI (lower urinary tract infection) 04/03/2016   Proteus treated with Septra DS   Past Surgical History:  Procedure Laterality Date  . BREAST SURGERY    . ERCP N/A 01/22/2016   Procedure: ENDOSCOPIC RETROGRADE CHOLANGIOPANCREATOGRAPHY  (ERCP) With biliary sphincterotomy and stone extraction ;  Surgeon: Rogene Houston, MD;  Location: [facility] ORS;  Service: Endoscopy;  Laterality: N/A;  . ESOPHAGOGASTRODUODENOSCOPY N/A 05/07/2015   Procedure: ESOPHAGOGASTRODUODENOSCOPY (EGD);  Surgeon: Rogene Houston, MD;  Location: [facility] ENDO SUITE;  Service: Endoscopy;  Laterality: N/A;    Allergies  Allergen Reactions  . Codeine Nausea And Vomiting  . Nsaids     Current Outpatient Prescriptions on File Prior to Visit  Medication Sig Dispense Refill  . acetaminophen (TYLENOL) 325 MG tablet Take 650 mg by mouth every 6 (six) hours as needed.    . Amino Acids-Protein Hydrolys (FEEDING SUPPLEMENT, PRO-STAT SUGAR FREE 64,) LIQD ProStat 30 ml TID mix with 4 oz water or juice three times a day between meals    . Balsam [place]-Castor Oil (VENELEX) OINT Apply to sacrum and bilateral buttock every shift and as needed for prevention    . Cholecalciferol 1000 units TBDP Take 1 tablet by mouth daily    . docusate sodium (COLACE) 100 MG capsule Take 100 mg by mouth every morning.    . ferrous sulfate (FERROUSUL) 325 (65 FE) MG tablet Take 325 mg by mouth daily with breakfast.    . losartan (COZAAR) 50 MG tablet Take 50 mg by mouth daily.    . Multiple Vitamins-Minerals (MULTIVITAMIN WITH MINERALS) tablet Take 1 tablet by mouth daily.    Marland Kitchen omeprazole (PRILOSEC) 20 MG capsule Take 20 mg by mouth daily.    . potassium chloride (K-DUR) 10 MEQ tablet Take 10 mEq by mouth daily.    . simvastatin (ZOCOR) 10 MG tablet Take 10 mg by mouth at bedtime.    . traMADol (ULTRAM) 50 MG tablet Take 1 tablet (50 mg total) by mouth every 6 (six) hours as needed for moderate pain or severe pain. 30 tablet 0   No current facility-administered medications on file prior to visit.      Review of Systems   This is quite limited secondary to dementia provided by nursing as well as family.  In general no complaints fever or chills she is gaining weight.  Skin is not complaining  of rashes or itching.  Head ears eyes nose mouth and throat no visual changes has had dry eyes this appears to have improved however she does have a runny nose.  Respiratory does not complain shortness breath or cough.  Cardiac no chest pain she has baseline lower extremity edema bit more the left versus the right which is not new.  GI is not complaining of nausea vomiting diarrhea constipation or abdominal discomfort-again she does have a history of biliary obstruction in the past.  She is not complaining of dysuria.  Muscle skeletal does have a history of osteoarthritis does receive tramadol as needed for pain is appears to be controlled.  Neurologic is not complaining of dizziness headache syncope.  Psych does have a history of dementia but she appears to be doing quite well with supportive care.    Immunization History  Administered Date(s) Administered  . Influenza,inj,Quad PF,36+ Mos 05/04/2015  .  Influenza-Unspecified 05/28/2016   Pertinent  Health Maintenance Due  Topic Date Due  . DEXA SCAN  02/09/2017 (Originally 11/30/1988)  . PNA vac Low Risk Adult (1 of 2 - PCV13) 02/09/2017 (Originally 11/30/1988)  . INFLUENZA VACCINE  Completed   No flowsheet data found. Functional Status Survey:  temperature 98.2 pulse 80 respirations 16 blood pressure 111/66 weight is 121.6 this is been stable for the past month appears to be trending up   Body mass index is 20.87 kg/m. Physical Exam  In general this is a frail elderly female in no distress sitting comfortably in her wheelchair.  Her skin is warm and dry.  Eyes sclera and conjunctiva are clear pupils appear reactive I do not see any drainage visual acuity appears grossly intact she has arcus senilis bilaterally-she has prescription lenses.  Nose A do not some clear drainage.  Oropharynx is clear mucous membranes moist.  Chest is clear to auscultation there is no labored breathing.  Heart is regular rate and rhythm with  a slight 1 to 2/6 systolic murmur she has trace lower extremity edema on the right 1+ on the left this is not new her pedal pulses are intact bilaterally anemia is cool to touch nonerythematous nontender.  Musculoskeletal has diffuse arthritic changes most prominently of her hands and fingers bilaterally she is able to grip my fingers bilaterally and raise her arms to some extent although limited range of motion of her shoulders bilaterally she has significant lower extremity weakness which is baseline.  Neurologic is grossly intact her speech is limited but clear no lateralizing findings.  Psych she is oriented to self does have baseline confusion does follow simple verbal commands without difficulty   Labs reviewed:  Recent Labs  02/18/16 0720 02/23/16 0600 03/31/16 0655  NA 137 136 133*  K 4.1 3.8 4.1  CL 105 104 102  CO2 28 28 26   GLUCOSE 95 89 108*  BUN 17 24* 19  CREATININE 0.79 0.81 0.66  CALCIUM 8.2* 8.3* 8.3*    Recent Labs  02/04/16 0630 02/11/16 0705 03/31/16 0655 04/13/16 0720  AST 15 30 14*  --   ALT 10* 18 9*  --   ALKPHOS 49 43 40  --   BILITOT 0.5 0.8 0.3  --   PROT 6.0* 6.1* 6.4*  --   ALBUMIN 2.1* 2.4* 2.1* 2.2*    Recent Labs  04/14/16 0720 04/29/16 0800 05/21/16 1200  WBC 6.2 5.6 5.6  NEUTROABS 2.7 2.8 3.0  HGB 8.3* 8.4* 8.9*  HCT 25.4* 25.1* 28.2*  MCV 86.7 87.2 89.8  PLT 314 337 368   Lab Results  Component Value Date   TSH 6.116 (H) 05/21/2016   No results found for: HGBA1C Lab Results  Component Value Date   CHOL 98 02/04/2016   HDL 30 (L) 02/04/2016   LDLCALC 55 02/04/2016   TRIG 67 02/04/2016   CHOLHDL 3.3 02/04/2016    Significant Diagnostic Results in last 30 days:  No results found.  Assessment/Plan  : #1 nasal drainage-we will do a short course of Claritin 10 mg by mouth daily 5 days and monitor appears she does have some history of allergic rhinitis and has been on Claritin in the past.  #2 history of biliary  obstruction with elevated liver function tests appears to be asymptomatic no abdominal pain liver function tests all labs in August appear to have largely normalized will update this to ensure stability.  #3 history of anemia again hemoglobin appears to  be trending up will update this as well last hemoglobin was 8.9 she has been started on iron.  #4 history hypothyroidism TSH was mildly increased on lab and September adjustments have been made to her Synthroid update TSH is pending.  #5 history of low albumin this appears to be slowly trending up most recently in the low twos will update this she is on supplements including post statin ensure and again has eating better.  #6 history of dry eyes she is completing a course of artificial tears this appears to have improved has been on Cipro before for suspected conjunctivitis at this point will monitor she appears to be prone to ocular issues.  Number 7I note she is on Zocor will update a lipid panel again will be getting liver function tests as well.  #8 hypertension this appears stable she is on Cozaar 50 mg a day recent blood pressures 111/66-120/73.  #9 history of GERD she is on proton pump inhibitor this appears to be relatively asymptomatic.  #10 history of osteoarthritis this appears to be significant but it appears the tramadol is effective for pain management will monitor.  #11 she is on vitamin D supplements will update a vitamin D level as well.  #12 history of dementia-again she appears to be doing well with supportive care currently on no medications  [id number]-of note greater than 40 minutes spent assessing patient-reviewing her labs-reviewing her chart-discussing her status with family at bedside-and coordinating and formulating a plan of care for numerous diagnoses-of note greater than 50% of time spent coordinating plan of care

## 2016-06-10 ENCOUNTER — Encounter (HOSPITAL_COMMUNITY)
Admission: RE | Admit: 2016-06-10 | Discharge: 2016-06-10 | Disposition: A | Discharge status: Home / Self Care | Payer: Medicare Other | Source: Skilled Nursing Facility | Attending: Internal Medicine | Admitting: Internal Medicine

## 2016-06-10 DIAGNOSIS — K219 Gastro-esophageal reflux disease without esophagitis: Secondary | ICD-10-CM | POA: Diagnosis not present

## 2016-06-10 DIAGNOSIS — Z48815 Encounter for surgical aftercare following surgery on the digestive system: Secondary | ICD-10-CM | POA: Insufficient documentation

## 2016-06-10 DIAGNOSIS — I959 Hypotension, unspecified: Secondary | ICD-10-CM | POA: Diagnosis not present

## 2016-06-10 DIAGNOSIS — I10 Essential (primary) hypertension: Secondary | ICD-10-CM | POA: Diagnosis not present

## 2016-06-10 LAB — COMPREHENSIVE METABOLIC PANEL
ALT: 11 U/L — AB (ref 14–54)
ANION GAP: 6 (ref 5–15)
AST: 17 U/L (ref 15–41)
Albumin: 2.9 g/dL — ABNORMAL LOW (ref 3.5–5.0)
Alkaline Phosphatase: 41 U/L (ref 38–126)
BUN: 30 mg/dL — ABNORMAL HIGH (ref 6–20)
CHLORIDE: 102 mmol/L (ref 101–111)
CO2: 28 mmol/L (ref 22–32)
CREATININE: 0.73 mg/dL (ref 0.44–1.00)
Calcium: 9.1 mg/dL (ref 8.9–10.3)
Glucose, Bld: 96 mg/dL (ref 65–99)
Potassium: 4.2 mmol/L (ref 3.5–5.1)
SODIUM: 136 mmol/L (ref 135–145)
Total Bilirubin: 0.4 mg/dL (ref 0.3–1.2)
Total Protein: 7.1 g/dL (ref 6.5–8.1)

## 2016-06-10 LAB — LIPID PANEL
CHOLESTEROL: 114 mg/dL (ref 0–200)
HDL: 60 mg/dL (ref >40)
LDL CALC: 42 mg/dL (ref 0–99)
TRIGLYCERIDES: 60 mg/dL (ref <150)
Total CHOL/HDL Ratio: 1.9 RATIO
VLDL: 12 mg/dL (ref 0–40)

## 2016-06-10 LAB — CBC
HCT: 29.9 % — ABNORMAL LOW (ref 36.0–46.0)
Hemoglobin: 9.6 g/dL — ABNORMAL LOW (ref 12.0–15.0)
MCH: 29.1 pg (ref 26.0–34.0)
MCHC: 32.1 g/dL (ref 30.0–36.0)
MCV: 90.6 fL (ref 78.0–100.0)
PLATELETS: 295 10*3/uL (ref 150–400)
RBC: 3.3 MIL/uL — AB (ref 3.87–5.11)
RDW: 16.5 % — AB (ref 11.5–15.5)
WBC: 5.6 10*3/uL (ref 4.0–10.5)

## 2016-06-11 LAB — VITAMIN D 25 HYDROXY (VIT D DEFICIENCY, FRACTURES): Vit D, 25-Hydroxy: 54 ng/mL (ref 30.0–100.0)

## 2016-07-06 ENCOUNTER — Encounter: Payer: Self-pay | Admitting: Internal Medicine

## 2016-07-06 NOTE — Progress Notes (Signed)
Location:   [place] Room Number: 157/W Place of Service:  SNF (31) Provider:  Geralyn Flash, MD  Patient Care Team: [name], MD as PCP - General (Internal Medicine)  Extended Emergency Contact Information Primary Emergency Contact: [name] Address: [address]          [place],  [place] Home Phone: [phone] Relation: None Secondary Emergency Contact: [place], [place] [address] [place] Phone: [phone] Relation: Grandson  Code Status:  DNR Goals of care: Advanced Directive information Advanced Directives 07/06/2016  Does patient have an advance directive? Yes  Type of Advance Directive Out of facility DNR (pink MOST or yellow form)  Does patient want to make changes to advanced directive? -  Copy of advanced directive(s) in chart? Yes  Would patient like information on creating an advanced directive? -  Pre-existing out of facility DNR order (yellow form or pink MOST form) -     Chief Complaint  Patient presents with  . Medical Management of Chronic Issues    Routine Visit    HPI:  Pt is a 82 y.o. female seen today for medical management of chronic diseases.     Past Medical History:  Diagnosis Date  . Anemia   . Arthritis   . Bradycardia   . Cancer [facility])    had breast cancer 8 yrs ago  . 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 GERD (gastroesophageal reflux disease)   . HCAP (healthcare-associated pneumonia)   . Hypertension   . Hypokalemia   . Hypomagnesemia   . Osteoarthritis   . Pancreatitis, acute 05/03/2015  . Thyroid disease   . Urinary tract infection 02/07/16   Coag negative staph. Nitrofurantoin prescribed   . UTI (lower urinary tract infection) 04/03/2016   Proteus treated with Septra DS   Past Surgical History:  Procedure Laterality Date  . BREAST SURGERY    . ERCP N/A 01/22/2016   Procedure: ENDOSCOPIC RETROGRADE  CHOLANGIOPANCREATOGRAPHY (ERCP) With biliary sphincterotomy and stone extraction ;  Surgeon: Rogene Houston, MD;  Location: [facility]S;  Service: Endoscopy;  Laterality: N/A;  . ESOPHAGOGASTRODUODENOSCOPY N/A 05/07/2015   Procedure: ESOPHAGOGASTRODUODENOSCOPY (EGD);  Surgeon: Rogene Houston, MD;  Location: [facility];  Service: Endoscopy;  Laterality: N/A;    Allergies  Allergen Reactions  . Codeine Nausea And Vomiting  . Nsaids     Current Outpatient Prescriptions on File Prior to Visit  Medication Sig Dispense Refill  . acetaminophen (TYLENOL) 325 MG tablet Take 650 mg by mouth every 6 (six) hours as needed.    . Amino Acids-Protein Hydrolys (FEEDING SUPPLEMENT, PRO-STAT SUGAR FREE 64,) LIQD ProStat 30 ml TID mix with 4 oz water or juice three times a day between meals    . Balsam [place]-Castor Oil (VENELEX) OINT Apply to sacrum and bilateral buttock every shift and as needed for prevention    . Cholecalciferol 1000 units TBDP Take 1 tablet by mouth daily    . docusate sodium (COLACE) 100 MG capsule Take 100 mg by mouth every morning.    . ferrous sulfate (FERROUSUL) 325 (65 FE) MG tablet Take 325 mg by mouth daily with breakfast.    . levothyroxine (SYNTHROID, LEVOTHROID) 100 MCG tablet Take 100 mcg along with a 1/2 125 mcg tablet once a day    . levothyroxine (SYNTHROID, LEVOTHROID) 125 MCG tablet Take 1/2 of 125  mcg along with 100 mcg tablet once a day    . losartan (COZAAR) 50 MG tablet Take 50 mg by mouth daily.    . Multiple Vitamins-Minerals (MULTIVITAMIN WITH MINERALS) tablet Take 1 tablet by mouth daily.    Marland Kitchen omeprazole (PRILOSEC) 20 MG capsule Take 20 mg by mouth daily.    . polyvinyl alcohol (LIQUIFILM TEARS) 1.4 % ophthalmic solution Place 1 drop into both eyes every 6 (six) hours.    . potassium chloride (K-DUR) 10 MEQ tablet Take 10 mEq by mouth daily.    . simvastatin (ZOCOR) 10 MG tablet Take 10 mg by mouth at bedtime.    . traMADol (ULTRAM) 50 MG tablet Take 1 tablet (50 mg  total) by mouth every 6 (six) hours as needed for moderate pain or severe pain. 30 tablet 0   No current facility-administered medications on file prior to visit.      Review of Systems  Immunization History  Administered Date(s) Administered  . Influenza,inj,Quad PF,36+ Mos 05/04/2015  . Influenza-Unspecified 05/28/2016  . Pneumococcal-Unspecified 06/10/2016   Pertinent  Health Maintenance Due  Topic Date Due  . DEXA SCAN  02/09/2017 (Originally [birth year]30/1986)  . PNA vac Low Risk Adult (2 of 2 - PCV13) 06/10/2017  . INFLUENZA VACCINE  Completed   No flowsheet data found. Functional Status Survey:    Vitals:   07/06/16 1212  BP: 136/68  Pulse: 83  Resp: 18  Temp: 98.6 F (37 C)  TempSrc: Oral   There is no height or weight on file to calculate BMI. Physical Exam  Labs reviewed:  Recent Labs  02/23/16 0600 03/31/16 0655 06/10/16 0500  NA 136 133* 136  K 3.8 4.1 4.2  CL 104 102 102  CO2 28 26 28   GLUCOSE 89 108* 96  BUN 24* 19 30*  CREATININE 0.81 0.66 0.73  CALCIUM 8.3* 8.3* 9.1    Recent Labs  02/11/16 0705 03/31/16 0655 04/13/16 0720 06/10/16 0500  AST 30 14*  --  17  ALT 18 9*  --  11*  ALKPHOS 43 40  --  41  BILITOT 0.8 0.3  --  0.4  PROT 6.1* 6.4*  --  7.1  ALBUMIN 2.4* 2.1* 2.2* 2.9*    Recent Labs  04/14/16 0720 04/29/16 0800 05/21/16 1200 06/10/16 0500  WBC 6.2 5.6 5.6 5.6  NEUTROABS 2.7 2.8 3.0  --   HGB 8.3* 8.4* 8.9* 9.6*  HCT 25.4* 25.1* 28.2* 29.9*  MCV 86.7 87.2 89.8 90.6  PLT 314 337 368 295   Lab Results  Component Value Date   TSH 6.116 (H) 05/21/2016   No results found for: HGBA1C Lab Results  Component Value Date   CHOL 114 06/10/2016   HDL 60 06/10/2016   LDLCALC 42 06/10/2016   TRIG 60 06/10/2016   CHOLHDL 1.9 06/10/2016    Significant Diagnostic Results in last 30 days:  No results found.  Assessment/Plan There are no diagnoses linked to this encounter.   Family/ staff Communication:   Labs/tests  ordered:       This encounter was created in error - please disregard.

## 2016-07-08 ENCOUNTER — Non-Acute Institutional Stay (SKILLED_NURSING_FACILITY): Payer: Medicare Other | Admitting: Internal Medicine

## 2016-07-08 ENCOUNTER — Encounter: Payer: Self-pay | Admitting: Internal Medicine

## 2016-07-08 DIAGNOSIS — E039 Hypothyroidism, unspecified: Secondary | ICD-10-CM | POA: Diagnosis not present

## 2016-07-08 DIAGNOSIS — I10 Essential (primary) hypertension: Secondary | ICD-10-CM

## 2016-07-08 DIAGNOSIS — D509 Iron deficiency anemia, unspecified: Secondary | ICD-10-CM | POA: Diagnosis not present

## 2016-07-08 DIAGNOSIS — F0280 Dementia in other diseases classified elsewhere without behavioral disturbance: Secondary | ICD-10-CM | POA: Diagnosis not present

## 2016-07-08 DIAGNOSIS — G309 Alzheimer's disease, unspecified: Secondary | ICD-10-CM

## 2016-07-08 NOTE — Progress Notes (Signed)
Location:   [place] Room Number: 157/W Place of Service:  SNF (31) Provider:  Geralyn Flash, MD  Patient Care Team: [name], MD as PCP - General (Internal Medicine)  Extended Emergency Contact Information Primary Emergency Contact: [name] Address: [address]          [place],  [place] Home Phone: [phone] Relation: None Secondary Emergency Contact: [place], [place] [address] [place] Phone: [phone] Relation: Grandson  Code Status:  DNR Goals of care: Advanced Directive information Advanced Directives 07/08/2016  Does patient have an advance directive? Yes  Type of Advance Directive Out of facility DNR (pink MOST or yellow form)  Does patient want to make changes to advanced directive? No - Patient declined  Copy of advanced directive(s) in chart? Yes  Would patient like information on creating an advanced directive? -  Pre-existing out of facility DNR order (yellow form or pink MOST form) -     Chief Complaint  Patient presents with  . Medical Management of Chronic Issues    Routine Visit    HPI:  Pt is a 82 y.o. female seen today for medical management of chronic diseases.  Patient has h/o Dementia, hypothyroidism, Hypertension, And anemia. Patient is long term resident of facility. She has been stable since last visit. Has chronic dry eyes which were treated. Her weight is now at 122 lbs which is 1 lb more then last week. Her Hgb has also stabilized on iron supplement. I was unable to obtain anything from the patient but per nurses she has been doing well.   Past Medical History:  Diagnosis Date  . Anemia   . Arthritis   . Bradycardia   . Cancer [facility])    had breast cancer 8 yrs ago  . 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 GERD (gastroesophageal reflux disease)   . HCAP (healthcare-associated pneumonia)   . Hypertension   .  Hypokalemia   . Hypomagnesemia   . Osteoarthritis   . Pancreatitis, acute 05/03/2015  . Thyroid disease   . Urinary tract infection 02/07/16   Coag negative staph. Nitrofurantoin prescribed   . UTI (lower urinary tract infection) 04/03/2016   Proteus treated with Septra DS   Past Surgical History:  Procedure Laterality Date  . BREAST SURGERY    . ERCP N/A 01/22/2016   Procedure: ENDOSCOPIC RETROGRADE CHOLANGIOPANCREATOGRAPHY (ERCP) With biliary sphincterotomy and stone extraction ;  Surgeon: Rogene Houston, MD;  Location: [facility] ORS;  Service: Endoscopy;  Laterality: N/A;  . ESOPHAGOGASTRODUODENOSCOPY N/A 05/07/2015   Procedure: ESOPHAGOGASTRODUODENOSCOPY (EGD);  Surgeon: Rogene Houston, MD;  Location: [facility] ENDO SUITE;  Service: Endoscopy;  Laterality: N/A;    Allergies  Allergen Reactions  . Codeine Nausea And Vomiting  . Nsaids     Current Outpatient Prescriptions on File Prior to Visit  Medication Sig Dispense Refill  . acetaminophen (TYLENOL) 325 MG tablet Take 650 mg by mouth every 6 (six) hours as needed.    . Amino Acids-Protein Hydrolys (FEEDING SUPPLEMENT, PRO-STAT SUGAR FREE 64,) LIQD ProStat 30 ml TID mix with 4 oz water or juice three times a day between meals    . Balsam [place]-Castor Oil (VENELEX) OINT Apply to sacrum and bilateral buttock every shift and as needed for prevention    . Cholecalciferol 1000 units TBDP Take 1 tablet by mouth daily    .  docusate sodium (COLACE) 100 MG capsule Take 100 mg by mouth every morning.    . ferrous sulfate (FERROUSUL) 325 (65 FE) MG tablet Take 325 mg by mouth daily with breakfast.    . levothyroxine (SYNTHROID, LEVOTHROID) 100 MCG tablet Take 100 mcg along with a 1/2 125 mcg tablet once a day    . levothyroxine (SYNTHROID, LEVOTHROID) 125 MCG tablet Take 1/2 of 125 mcg along with 100 mcg tablet once a day    . losartan (COZAAR) 50 MG tablet Take 50 mg by mouth daily.    . Multiple Vitamins-Minerals (MULTIVITAMIN WITH MINERALS) tablet Take  1 tablet by mouth daily.    Marland Kitchen omeprazole (PRILOSEC) 20 MG capsule Take 20 mg by mouth daily.    . polyvinyl alcohol (LIQUIFILM TEARS) 1.4 % ophthalmic solution Place 1 drop into both eyes every 6 (six) hours.    . potassium chloride (K-DUR) 10 MEQ tablet Take 10 mEq by mouth daily.    . simvastatin (ZOCOR) 10 MG tablet Take 10 mg by mouth at bedtime.    . traMADol (ULTRAM) 50 MG tablet Take 1 tablet (50 mg total) by mouth every 6 (six) hours as needed for moderate pain or severe pain. 30 tablet 0   No current facility-administered medications on file prior to visit.      Review of Systems  Unable to perform ROS: Dementia    Immunization History  Administered Date(s) Administered  . Influenza,inj,Quad PF,36+ Mos 05/04/2015  . Influenza-Unspecified 05/28/2016  . Pneumococcal-Unspecified 06/10/2016   Pertinent  Health Maintenance Due  Topic Date Due  . DEXA SCAN  02/09/2017 (Originally 11/30/1988)  . PNA vac Low Risk Adult (2 of 2 - PCV13) 06/10/2017  . INFLUENZA VACCINE  Completed   No flowsheet data found. Functional Status Survey:    Vitals:   07/08/16 1128  BP: 136/68  Pulse: 83  Resp: 18  Temp: 98.6 F (37 C)  TempSrc: Oral   There is no height or weight on file to calculate BMI. Physical Exam  Constitutional: She appears well-developed and well-nourished.  HENT:  Mouth/Throat: Oropharynx is clear and moist.  Eyes: Pupils are equal, round, and reactive to light.  Cardiovascular: Normal rate, regular rhythm and normal heart sounds.   No murmur heard. Pulmonary/Chest: Effort normal and breath sounds normal. No respiratory distress. She has no wheezes. She exhibits no tenderness.  Abdominal: Soft. Bowel sounds are normal. She exhibits no distension. There is no tenderness. There is no rebound and no guarding.  Musculoskeletal: She exhibits edema.  Neurological: She is alert.  Does not respond to any questions.    Labs reviewed:  Recent Labs  02/23/16 0600  03/31/16 0655 06/10/16 0500  NA 136 133* 136  K 3.8 4.1 4.2  CL 104 102 102  CO2 28 26 28   GLUCOSE 89 108* 96  BUN 24* 19 30*  CREATININE 0.81 0.66 0.73  CALCIUM 8.3* 8.3* 9.1    Recent Labs  02/11/16 0705 03/31/16 0655 04/13/16 0720 06/10/16 0500  AST 30 14*  --  17  ALT 18 9*  --  11*  ALKPHOS 43 40  --  41  BILITOT 0.8 0.3  --  0.4  PROT 6.1* 6.4*  --  7.1  ALBUMIN 2.4* 2.1* 2.2* 2.9*    Recent Labs  04/14/16 0720 04/29/16 0800 05/21/16 1200 06/10/16 0500  WBC 6.2 5.6 5.6 5.6  NEUTROABS 2.7 2.8 3.0  --   HGB 8.3* 8.4* 8.9* 9.6*  HCT 25.4* 25.1*  28.2* 29.9*  MCV 86.7 87.2 89.8 90.6  PLT 314 337 368 295   Lab Results  Component Value Date   TSH 6.116 (H) 05/21/2016   No results found for: HGBA1C Lab Results  Component Value Date   CHOL 114 06/10/2016   HDL 60 06/10/2016   LDLCALC 42 06/10/2016   TRIG 60 06/10/2016   CHOLHDL 1.9 06/10/2016    Significant Diagnostic Results in last 30 days:  No results found.  Assessment/Plan  Essential hypertension Blood pressure stable. Continue losartan.  Hypothyroidism Synthroid was increased . Will repeat TSH.  Alzheimer's dementia without behavioral disturbance,  Continue supportive care.  Iron deficiency anemia, Hem occult negative. She ws started on omeprazole. Will continue iron supplement. Not candidate for aggressive therapy.  Family/ staff Communication:  Repeat TSH and BMP and CBC. Discontinue potassium as patient potassium is stable and she is not on any diuretic.  Labs/tests ordered:

## 2016-07-12 ENCOUNTER — Encounter (HOSPITAL_COMMUNITY)
Admission: RE | Admit: 2016-07-12 | Discharge: 2016-07-12 | Disposition: A | Discharge status: Home / Self Care | Payer: Medicare Other | Source: Skilled Nursing Facility | Attending: Internal Medicine | Admitting: Internal Medicine

## 2016-07-12 DIAGNOSIS — Z48815 Encounter for surgical aftercare following surgery on the digestive system: Secondary | ICD-10-CM | POA: Insufficient documentation

## 2016-07-12 DIAGNOSIS — D509 Iron deficiency anemia, unspecified: Secondary | ICD-10-CM | POA: Insufficient documentation

## 2016-07-12 DIAGNOSIS — N390 Urinary tract infection, site not specified: Secondary | ICD-10-CM | POA: Diagnosis not present

## 2016-07-12 DIAGNOSIS — E785 Hyperlipidemia, unspecified: Secondary | ICD-10-CM | POA: Insufficient documentation

## 2016-07-12 DIAGNOSIS — R17 Unspecified jaundice: Secondary | ICD-10-CM | POA: Diagnosis not present

## 2016-07-12 DIAGNOSIS — E039 Hypothyroidism, unspecified: Secondary | ICD-10-CM | POA: Diagnosis not present

## 2016-07-12 DIAGNOSIS — R262 Difficulty in walking, not elsewhere classified: Secondary | ICD-10-CM | POA: Insufficient documentation

## 2016-07-12 DIAGNOSIS — I10 Essential (primary) hypertension: Secondary | ICD-10-CM | POA: Insufficient documentation

## 2016-07-12 LAB — CBC WITH DIFFERENTIAL/PLATELET
BASOS PCT: 1 %
Basophils Absolute: 0 10*3/uL (ref 0.0–0.1)
EOS ABS: 0.2 10*3/uL (ref 0.0–0.7)
EOS PCT: 3 %
HCT: 31.2 % — ABNORMAL LOW (ref 36.0–46.0)
HEMOGLOBIN: 10 g/dL — AB (ref 12.0–15.0)
Lymphocytes Relative: 27 %
Lymphs Abs: 1.6 10*3/uL (ref 0.7–4.0)
MCH: 28.7 pg (ref 26.0–34.0)
MCHC: 32.1 g/dL (ref 30.0–36.0)
MCV: 89.7 fL (ref 78.0–100.0)
MONOS PCT: 13 %
Monocytes Absolute: 0.8 10*3/uL (ref 0.1–1.0)
NEUTROS PCT: 56 %
Neutro Abs: 3.3 10*3/uL (ref 1.7–7.7)
PLATELETS: 326 10*3/uL (ref 150–400)
RBC: 3.48 MIL/uL — AB (ref 3.87–5.11)
RDW: 14.8 % (ref 11.5–15.5)
WBC: 5.9 10*3/uL (ref 4.0–10.5)

## 2016-07-12 LAB — BASIC METABOLIC PANEL
Anion gap: 8 (ref 5–15)
BUN: 28 mg/dL — AB (ref 6–20)
CALCIUM: 8.8 mg/dL — AB (ref 8.9–10.3)
CO2: 27 mmol/L (ref 22–32)
Chloride: 103 mmol/L (ref 101–111)
Creatinine, Ser: 0.75 mg/dL (ref 0.44–1.00)
GFR calc Af Amer: >60 mL/min (ref >60)
Glucose, Bld: 92 mg/dL (ref 65–99)
POTASSIUM: 3.6 mmol/L (ref 3.5–5.1)
SODIUM: 138 mmol/L (ref 135–145)

## 2016-07-12 LAB — TSH: TSH: 4.581 u[IU]/mL — AB (ref 0.350–4.500)

## 2016-07-16 ENCOUNTER — Encounter: Payer: Self-pay | Admitting: Internal Medicine

## 2016-07-16 ENCOUNTER — Non-Acute Institutional Stay (SKILLED_NURSING_FACILITY): Payer: Medicare Other | Admitting: Internal Medicine

## 2016-07-16 ENCOUNTER — Other Ambulatory Visit: Payer: Self-pay

## 2016-07-16 DIAGNOSIS — E039 Hypothyroidism, unspecified: Secondary | ICD-10-CM | POA: Diagnosis not present

## 2016-07-16 DIAGNOSIS — I10 Essential (primary) hypertension: Secondary | ICD-10-CM

## 2016-07-16 DIAGNOSIS — H109 Unspecified conjunctivitis: Secondary | ICD-10-CM

## 2016-07-16 MED ORDER — TRAMADOL HCL 50 MG PO TABS: 50.0000 mg | ORAL_TABLET | Freq: Four times a day (QID) | ORAL | 5 refills | Status: DC | PRN

## 2016-07-16 NOTE — Telephone Encounter (Signed)
RX faxed to [facility] @ [phone]. Phone number [phone]  

## 2016-07-16 NOTE — Progress Notes (Signed)
Location:   [place] Room Number: 157/W Place of Service:  SNF (31) Provider:  [name], MD  Patient Care Team: [name], MD as PCP - General (Internal Medicine)  Extended Emergency Contact Information Primary Emergency Contact: [name] Address: [address]          [place],  [place] Home Phone: [phone] Relation: None Secondary Emergency Contact: [place], [place] [address] [name] of [place] Phone: [phone] Relation: Grandson  Code Status:  157/W Goals of care: Advanced Directive information Advanced Directives 07/16/2016  Does patient have an advance directive? Yes  Type of Advance Directive Out of facility DNR (pink MOST or yellow form)  Does patient want to make changes to advanced directive? No - Patient declined  Copy of advanced directive(s) in chart? Yes  Would patient like information on creating an advanced directive? -  Pre-existing out of facility DNR order (yellow form or pink MOST form) -     Chief Complaint  Patient presents with  . Acute Visit    Eleevated TSH and Conjunctivitis    HPI:  Pt is a 82 y.o. female seen today for an acute visit for Concerns about conjunctivitis with some exudate of her eyes-also follow-up TSH with recent labs showing mild elevation of 4.581.  She does have a history of hypothyroidism TSH actually is trending down one month ago was 6.16-in 2 months ago was 8.645-she is currently on 162.5 g of Synthroid.  Again she also has been noted to have some exudate of her eyes and I'm following up on this.     Past Medical History:  Diagnosis Date  . Anemia   . Arthritis   . Bradycardia   . Cancer [facility])    had breast cancer 8 yrs ago  . 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 GERD (gastroesophageal reflux disease)   . HCAP (healthcare-associated pneumonia)   . Hypertension   . Hypokalemia   .  Hypomagnesemia   . Osteoarthritis   . Pancreatitis, acute 05/03/2015  . Thyroid disease   . Urinary tract infection 02/07/16   Coag negative staph. Nitrofurantoin prescribed   . UTI (lower urinary tract infection) 04/03/2016   Proteus treated with Septra DS   Past Surgical History:  Procedure Laterality Date  . BREAST SURGERY    . ERCP N/A 01/22/2016   Procedure: ENDOSCOPIC RETROGRADE CHOLANGIOPANCREATOGRAPHY (ERCP) With biliary sphincterotomy and stone extraction ;  Surgeon: Rogene Houston, MD;  Location: [facility];  Service: Endoscopy;  Laterality: N/A;  . ESOPHAGOGASTRODUODENOSCOPY N/A 05/07/2015   Procedure: ESOPHAGOGASTRODUODENOSCOPY (EGD);  Surgeon: Rogene Houston, MD;  Location: [facility] ENDO [facility];  Service: Endoscopy;  Laterality: N/A;    Allergies  Allergen Reactions  . Codeine Nausea And Vomiting  . Nsaids     Current Outpatient Prescriptions on File Prior to Visit  Medication Sig Dispense Refill  . acetaminophen (TYLENOL) 325 MG tablet Take 650 mg by mouth every 6 (six) hours as needed.    . Amino Acids-Protein Hydrolys (FEEDING SUPPLEMENT, PRO-STAT SUGAR FREE 64,) LIQD ProStat 30 ml TID mix with 4 oz water or juice three times a day between meals    . Balsam [place]-Castor Oil (VENELEX) OINT Apply to sacrum and bilateral buttock every shift and as needed for prevention    . Cholecalciferol 1000 units TBDP Take 1 tablet by mouth daily    .  docusate sodium (COLACE) 100 MG capsule Take 100 mg by mouth every morning.    . ferrous sulfate (FERROUSUL) 325 (65 FE) MG tablet Take 325 mg by mouth daily with breakfast.    . levothyroxine (SYNTHROID, LEVOTHROID) 100 MCG tablet Take 100 mcg along with a 1/2 125 mcg tablet once a day    . levothyroxine (SYNTHROID, LEVOTHROID) 125 MCG tablet Take 1/2 of 125 mcg along with 100 mcg tablet once a day    . losartan (COZAAR) 50 MG tablet Take 50 mg by mouth daily.    . Multiple Vitamins-Minerals (MULTIVITAMIN WITH MINERALS) tablet Take 1 tablet by mouth  daily.    Marland Kitchen omeprazole (PRILOSEC) 20 MG capsule Take 20 mg by mouth daily.    . polyvinyl alcohol (LIQUIFILM TEARS) 1.4 % ophthalmic solution Place 1 drop into both eyes every 6 (six) hours.    . simvastatin (ZOCOR) 10 MG tablet Take 10 mg by mouth at bedtime.     No current facility-administered medications on file prior to visit.      Review of Systems   Unable to perform secondary to dementia-nursing did not report any issues other than eye exudate  Immunization History  Administered Date(s) Administered  . Influenza,inj,Quad PF,36+ Mos 05/04/2015  . Influenza-Unspecified 05/28/2016  . Pneumococcal-Unspecified 06/10/2016   Pertinent  Health Maintenance Due  Topic Date Due  . DEXA SCAN  02/09/2017 (Originally 11[birth year]30/1986)  . PNA vac Low Risk Adult (2 of 2 - PCV13) 06/10/2017  . INFLUENZA VACCINE  Completed   No flowsheet data found. Functional Status Survey:    Vitals:   07/16/16 1424  BP: (!) 153/82  Pulse: 71  Resp: 20  Temp: 97.6 F (36.4 C)  TempSrc: Oral   There is no height or weight on file to calculate BMI. Physical Exam   In general this is a pleasant elderly female in no distress sitting comfortably in her wheelchair.  Her skin is warm and dry.  Eyes she does appear to have some small whitish exudate at the margins of her eyes bilaterally visual acuity appears grossly intact and do not note any erythema of the orbital areas bilaterally.  Oropharynx clear mucous membranes moist.  Heart is regular rate and rhythm without murmur gallop or rub she has mild lower extremity edema bilaterally.  Chest is clear to auscultation with somewhat poor respiratory effort there is no labored breathing.  Abdomen is soft nontender positive bowel sounds.  Muscle skeletal does move all extremities 4 with lower extremity weakness and edema.  Neurologic she is alert no lateralizing findings she does not speak much.  Psych she does have history of dementia findings  compatible with this she does not speak much  Labs reviewed:  Recent Labs  03/31/16 0655 06/10/16 0500 07/12/16 0700  NA 133* 136 138  K 4.1 4.2 3.6  CL 102 102 103  CO2 26 28 27   GLUCOSE 108* 96 92  BUN 19 30* 28*  CREATININE 0.66 0.73 0.75  CALCIUM 8.3* 9.1 8.8*    Recent Labs  02/11/16 0705 03/31/16 0655 04/13/16 0720 06/10/16 0500  AST 30 14*  --  17  ALT 18 9*  --  11*  ALKPHOS 43 40  --  41  BILITOT 0.8 0.3  --  0.4  PROT 6.1* 6.4*  --  7.1  ALBUMIN 2.4* 2.1* 2.2* 2.9*    Recent Labs  04/29/16 0800 05/21/16 1200 06/10/16 0500 07/12/16 0700  WBC 5.6 5.6 5.6 5.9  NEUTROABS  2.8 3.0  --  3.3  HGB 8.4* 8.9* 9.6* 10.0*  HCT 25.1* 28.2* 29.9* 31.2*  MCV 87.2 89.8 90.6 89.7  PLT 337 368 295 326   Lab Results  Component Value Date   TSH 4.581 (H) 07/12/2016   No results found for: HGBA1C Lab Results  Component Value Date   CHOL 114 06/10/2016   HDL 60 06/10/2016   LDLCALC 42 06/10/2016   TRIG 60 06/10/2016   CHOLHDL 1.9 06/10/2016    Significant Diagnostic Results in last 30 days:  No results found.  Assessment/Plan   #1 conjunctivitis-will treat with Cipro off the Mobic solution 2 drops 3 times a day to both eyes 7 days and monitor.  #2 hypothyroidism lab reviewed her recent labs and dose adjustments this appears to be trending down and now almost normalized with a TSH of 4.581-at this point will monitor and update TSH in approximately 3 weeks  #3 hypertension-systolic is somewhat elevated today although this appears to be variable other recent blood pressures 125/77-110/72 at this point will monitor-she is on Cozaar 50 mg a day  JAARS, [place], [place]

## 2016-08-04 ENCOUNTER — Encounter: Payer: Self-pay | Admitting: Internal Medicine

## 2016-08-04 ENCOUNTER — Non-Acute Institutional Stay (SKILLED_NURSING_FACILITY): Payer: Medicare Other | Admitting: Internal Medicine

## 2016-08-04 DIAGNOSIS — E785 Hyperlipidemia, unspecified: Secondary | ICD-10-CM

## 2016-08-04 DIAGNOSIS — G309 Alzheimer's disease, unspecified: Secondary | ICD-10-CM | POA: Diagnosis not present

## 2016-08-04 DIAGNOSIS — F0280 Dementia in other diseases classified elsewhere without behavioral disturbance: Secondary | ICD-10-CM | POA: Diagnosis not present

## 2016-08-04 DIAGNOSIS — B351 Tinea unguium: Secondary | ICD-10-CM

## 2016-08-04 DIAGNOSIS — E039 Hypothyroidism, unspecified: Secondary | ICD-10-CM

## 2016-08-04 DIAGNOSIS — I10 Essential (primary) hypertension: Secondary | ICD-10-CM

## 2016-08-04 DIAGNOSIS — D509 Iron deficiency anemia, unspecified: Secondary | ICD-10-CM

## 2016-08-04 DIAGNOSIS — H109 Unspecified conjunctivitis: Secondary | ICD-10-CM

## 2016-08-04 NOTE — Progress Notes (Signed)
Location:   [place] Room Number: 157/W Place of Service:  SNF (31) Provider:  [name], MD  Patient Care Team: [name], MD as PCP - General (Internal Medicine)  Extended Emergency Contact Information Primary Emergency Contact: [name] Address: [address]          [place],  [place] Home Phone: [phone] Relation: None Secondary Emergency Contact: [place], [place] [address] [name] of [place] Phone: [phone] Relation: Grandson  Code Status:  DNR Goals of care: Advanced Directive information Advanced Directives 08/04/2016  Does Patient Have a Medical Advance Directive? Yes  Type of Advance Directive Out of facility DNR (pink MOST or yellow form)  Does patient want to make changes to medical advance directive? No - Patient declined  Copy of [place] in Chart? -  Would patient like information on creating a medical advance directive? -  Pre-existing out of facility DNR order (yellow form or pink MOST form) -     Chief Complaint  Patient presents with  . Acute Visit    Great Left Toe  With darkened changes. Also complains of insomnia-in I exudate  Also medical management of chronic medical issues including dementia-hypertension-anemia-hypothyroidism  HPI:  Pt is a 82 y.o. female seen today for an acute visit for evaluation of darkened toenail left great toenail-also complaints of insomnia.  Daughter also states that exudate of her eyes has recurred she apparently did respond earlier to a course of antibiotic eyedrops-she does have some history of dry eyes as well.  Regards her chronic medical conditions these appear to be relatively stable she does have a history of dementia but appears to be doing well with supportive care her weight has been stable she eats well with support according to her family.  She also has a history of hypertension she is on  Cozaar this appears stable recent blood pressures 109/69-128 over 4.  She also has a history of hypothyroidism TSH was elevated but Synthroid has gradually been increased in TSH appears to be trending down most recently was 4.581 back on November 13 so left the dose the same an updated TSH is actually pending for tomorrow.  S2 anemia she is on iron hemoglobin is stable at 10.0.  She also has a history of hyperlipidemia she is on Zocor last LDL was 42 on lab done in October  She was initially admitted here in [place] 2017 after hospitalization for biliary obstruction with jaundice and significant elevated liver function tests she did receive an EEG D an ERCP as well as sphinectomy  her liver function tests have largely normalized Lab done on 06/10/2016 albumin was 2.9 ALT was 11 otherwise liver function tests within normal limits.  She is not complaining of any abdominal pain   Past Medical History:  Diagnosis Date  . Anemia   . Arthritis   . Bradycardia   . Cancer [facility])    had breast cancer 8 yrs ago  . 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 GERD (gastroesophageal reflux disease)   . HCAP (healthcare-associated pneumonia)   . Hypertension   . Hypokalemia   . Hypomagnesemia   . Osteoarthritis   . Pancreatitis, acute 05/03/2015  . Thyroid disease   . Urinary tract infection 02/07/16   Coag negative staph. Nitrofurantoin prescribed   . UTI (lower urinary tract infection) 04/03/2016   Proteus  treated with Septra DS   Past Surgical History:  Procedure Laterality Date  . BREAST SURGERY    . ERCP N/A 01/22/2016   Procedure: ENDOSCOPIC RETROGRADE CHOLANGIOPANCREATOGRAPHY (ERCP) With biliary sphincterotomy and stone extraction ;  Surgeon: Rogene Houston, MD;  Location: [facility] ORS;  Service: Endoscopy;  Laterality: N/A;  . ESOPHAGOGASTRODUODENOSCOPY N/A 05/07/2015   Procedure: ESOPHAGOGASTRODUODENOSCOPY (EGD);  Surgeon: Rogene Houston, MD;  Location: [facility] ENDO SUITE;  Service:  Endoscopy;  Laterality: N/A;    Allergies  Allergen Reactions  . Codeine Nausea And Vomiting  . Nsaids     Current Outpatient Prescriptions on File Prior to Visit  Medication Sig Dispense Refill  . acetaminophen (TYLENOL) 325 MG tablet Take 650 mg by mouth every 6 (six) hours as needed.    . Amino Acids-Protein Hydrolys (FEEDING SUPPLEMENT, PRO-STAT SUGAR FREE 64,) LIQD ProStat 30 ml TID mix with 4 oz water or juice three times a day between meals    . Balsam [place]-Castor Oil (VENELEX) OINT Apply to sacrum and bilateral buttock every shift and as needed for prevention    . Cholecalciferol 1000 units TBDP Take 1 tablet by mouth daily    . docusate sodium (COLACE) 100 MG capsule Take 100 mg by mouth every morning.    . ferrous sulfate (FERROUSUL) 325 (65 FE) MG tablet Take 325 mg by mouth daily with breakfast.    . levothyroxine (SYNTHROID, LEVOTHROID) 100 MCG tablet Take 100 mcg along with a 1/2 125 mcg tablet once a day    . levothyroxine (SYNTHROID, LEVOTHROID) 125 MCG tablet Take 1/2 of 125 mcg along with 100 mcg tablet once a day    . losartan (COZAAR) 50 MG tablet Take 50 mg by mouth daily.    . Multiple Vitamins-Minerals (MULTIVITAMIN WITH MINERALS) tablet Take 1 tablet by mouth daily.    Marland Kitchen omeprazole (PRILOSEC) 20 MG capsule Take 20 mg by mouth daily.    . polyvinyl alcohol (LIQUIFILM TEARS) 1.4 % ophthalmic solution Place 1 drop into both eyes every 6 (six) hours.    . simvastatin (ZOCOR) 10 MG tablet Take 10 mg by mouth at bedtime.     No current facility-administered medications on file prior to visit.      Review of Systems  Sincerely unattainable secondary to dementia please see history of present illness-nursing staff and family do not report any acute issues other than again theeye I exudate and insomnia as well as concerns over a darkened toenail on the left great toe   Immunization History  Administered Date(s) Administered  . Influenza,inj,Quad PF,36+ Mos 05/04/2015    . Influenza-Unspecified 05/28/2016  . Pneumococcal-Unspecified 06/10/2016   Pertinent  Health Maintenance Due  Topic Date Due  . DEXA SCAN  02/09/2017 (Originally [birth year]30/1988)  . PNA vac Low Risk Adult (2 of 2 - PCV13) 06/10/2017  . INFLUENZA VACCINE  Completed   No flowsheet data found. Functional Status Survey:    Vitals:   08/04/16 1615  BP: (!) 156/88  Pulse: 69  Resp: 20  Temp: 98.6 F (37 C)  TempSrc: Oral  Of note blood pressures generally appear to be stable as noted above 109/69-128/64-115/78 occasional systolic spikes but this does not appear to be common.  Her weight is stable at 121.4 pounds  Physical Exam In general this is a pleasant elderly female in no distress lying comfortably in bed  Her skin is warm and dry.  Eyes she does appear to have some small whitish exudate at the margins  of her eyes bilaterally visual acuity appears grossly intact and do not note any erythema of the orbital areas bilaterally.  Oropharynx clear mucous membranes moist.  Heart is regular rate and rhythm without murmur gallop or rub she has mild lower extremity edema bilaterally. This appears baseline with previous exams  Chest is clear to auscultation with somewhat poor respiratory effort there is no labored breathing.  Abdomen is soft nontender positive bowel sounds.  Muscle skeletal does move all extremities 4 with lower extremity weakness and stiffness and  mild edema. Left great toe nail is black in appearance this appears to be more of a fungal change I don't really see any surrounding erythema or drainage or acute tenderness to palpation or warmth  Neurologic she is alert no lateralizing findings she does not speak much.  Psych she does have history of dementia findings compatible with this she does not speak much but does follow simple verbal commands  Labs reviewed:  Recent Labs  03/31/16 0655 06/10/16 0500 07/12/16 0700  NA 133* 136 138  K 4.1 4.2 3.6   CL 102 102 103  CO2 26 28 27   GLUCOSE 108* 96 92  BUN 19 30* 28*  CREATININE 0.66 0.73 0.75  CALCIUM 8.3* 9.1 8.8*    Recent Labs  02/11/16 0705 03/31/16 0655 04/13/16 0720 06/10/16 0500  AST 30 14*  --  17  ALT 18 9*  --  11*  ALKPHOS 43 40  --  41  BILITOT 0.8 0.3  --  0.4  PROT 6.1* 6.4*  --  7.1  ALBUMIN 2.4* 2.1* 2.2* 2.9*    Recent Labs  04/29/16 0800 05/21/16 1200 06/10/16 0500 07/12/16 0700  WBC 5.6 5.6 5.6 5.9  NEUTROABS 2.8 3.0  --  3.3  HGB 8.4* 8.9* 9.6* 10.0*  HCT 25.1* 28.2* 29.9* 31.2*  MCV 87.2 89.8 90.6 89.7  PLT 337 368 295 326   Lab Results  Component Value Date   TSH 4.581 (H) 07/12/2016   No results found for: HGBA1C Lab Results  Component Value Date   CHOL 114 06/10/2016   HDL 60 06/10/2016   LDLCALC 42 06/10/2016   TRIG 60 06/10/2016   CHOLHDL 1.9 06/10/2016    Significant Diagnostic Results in last 30 days:  No results found.  Assessment/Plan  Recurrent conjunctivitis-will restart Cipro 0.3% 2 drops 3 times a day for 10 days to both eyes perhaps a longer course will have a more beneficial effect   Suspected fungal changes of left great toe-I did discuss this with family at bedside-this at times is difficult to treat topically-at times the medication side effects outweigh any benefit-however will readdress this with Dr. Lyndel Safe  family did express understanding  Essential hypertension Blood pressure stable. Continue losartan. Recent blood pressures [id number] systolic spikes do not appear to be common  Hypothyroidism Synthroid was increased . Since then TSH appears to be trending down update TSH is pending for tomorrow  Alzheimer's dementia without behavioral disturbance,  Continue supportive care. Her weight appears to be relatively stable she is on supplements including Protostat and ensure  Iron deficiency anemia, Hem occult negative. She was started on omeprazole. Will continue iron supplement. Not  candidate for aggressive therapy. Hemoglobin appears stable on recent lab at 10.0 lab was done 07/12/2016  Insomnia-will start low-dose melatonin 3 mg daily at bedtime when necessary and monitor would like to be conservative starting out here secondary to advanced age and comorbidities.  History of hypokalemia in the past-this  appears to have stabilized but will update a metabolic panel to ensure stability last potassium was 3.6 on 07/12/2016  History of hyperlipidemia she is on Zocor last LDL was satisfactory at 42 on lab done October 2017-liver function tests appear fairly unremarkable except albumin of 2.9-again she is on protein supplementation    [id number]-of note greater than 35 minutes spent assessing patient-reviewing her chart-reviewing her labs-assessing her status with nursing staff as well as with her family at bedside-and coordinating and formulating a plan of care for numerous diagnoses-of note greater than 50% of time spent coordinating plan of care       Oralia Manis, [place]

## 2016-08-05 ENCOUNTER — Encounter (HOSPITAL_COMMUNITY)
Admission: AD | Admit: 2016-08-05 | Discharge: 2016-08-05 | Disposition: A | Discharge status: Home / Self Care | Payer: Medicare Other | Source: Skilled Nursing Facility | Attending: Internal Medicine | Admitting: Internal Medicine

## 2016-08-05 DIAGNOSIS — R1312 Dysphagia, oropharyngeal phase: Secondary | ICD-10-CM | POA: Insufficient documentation

## 2016-08-05 DIAGNOSIS — M6281 Muscle weakness (generalized): Secondary | ICD-10-CM | POA: Insufficient documentation

## 2016-08-05 DIAGNOSIS — R279 Unspecified lack of coordination: Secondary | ICD-10-CM | POA: Diagnosis not present

## 2016-08-05 LAB — CBC
HCT: 32.9 % — ABNORMAL LOW (ref 36.0–46.0)
Hemoglobin: 10.5 g/dL — ABNORMAL LOW (ref 12.0–15.0)
MCH: 28.6 pg (ref 26.0–34.0)
MCHC: 31.9 g/dL (ref 30.0–36.0)
MCV: 89.6 fL (ref 78.0–100.0)
Platelets: 341 10*3/uL (ref 150–400)
RBC: 3.67 MIL/uL — ABNORMAL LOW (ref 3.87–5.11)
RDW: 14.2 % (ref 11.5–15.5)
WBC: 5.6 10*3/uL (ref 4.0–10.5)

## 2016-08-05 LAB — BASIC METABOLIC PANEL
Anion gap: 9 (ref 5–15)
BUN: 18 mg/dL (ref 6–20)
CALCIUM: 9.2 mg/dL (ref 8.9–10.3)
CO2: 25 mmol/L (ref 22–32)
Chloride: 102 mmol/L (ref 101–111)
Creatinine, Ser: 0.72 mg/dL (ref 0.44–1.00)
GFR calc non Af Amer: >60 mL/min (ref >60)
Glucose, Bld: 100 mg/dL — ABNORMAL HIGH (ref 65–99)
Potassium: 4 mmol/L (ref 3.5–5.1)
SODIUM: 136 mmol/L (ref 135–145)

## 2016-08-05 LAB — TSH: TSH: 13.282 u[IU]/mL — AB (ref 0.350–4.500)

## 2016-08-20 ENCOUNTER — Encounter: Payer: Self-pay | Admitting: Internal Medicine

## 2016-08-20 NOTE — Progress Notes (Signed)
Location:   [place] Room Number: 157/W Place of Service:  SNF (31) Provider:  [name], MD  Patient Care Team: [name], MD as PCP - General (Internal Medicine)  Extended Emergency Contact Information Primary Emergency Contact: [name] Address: [address]          [place],  [place] Home Phone: [phone] Relation: None Secondary Emergency Contact: [place], [place] [address] [name] of [place] Phone: [phone] Relation: Grandson  Code Status:  DNR Goals of care: Advanced Directive information Advanced Directives 08/20/2016  Does Patient Have a Medical Advance Directive? Yes  Type of Advance Directive Out of facility DNR (pink MOST or yellow form)  Does patient want to make changes to medical advance directive? No - Patient declined  Copy of [place] in Chart? -  Would patient like information on creating a medical advance directive? -  Pre-existing out of facility DNR order (yellow form or pink MOST form) -     Chief Complaint  Patient presents with  . Medical Management of Chronic Issues    Routine Visit    HPI:  Pt is a 82 y.o. female seen today for medical management of chronic diseases.     Past Medical History:  Diagnosis Date  . Anemia   . Arthritis   . Bradycardia   . Cancer [facility])    had breast cancer 8 yrs ago  . 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 GERD (gastroesophageal reflux disease)   . HCAP (healthcare-associated pneumonia)   . Hypertension   . Hypokalemia   . Hypomagnesemia   . Osteoarthritis   . Pancreatitis, acute 05/03/2015  . Thyroid disease   . Urinary tract infection 02/07/16   Coag negative staph. Nitrofurantoin prescribed   . UTI (lower urinary tract infection) 04/03/2016   Proteus treated with Septra DS   Past Surgical History:  Procedure Laterality Date  . BREAST SURGERY    . ERCP N/A  01/22/2016   Procedure: ENDOSCOPIC RETROGRADE CHOLANGIOPANCREATOGRAPHY (ERCP) With biliary sphincterotomy and stone extraction ;  Surgeon: Rogene Houston, MD;  Location: [facility];  Service: Endoscopy;  Laterality: N/A;  . ESOPHAGOGASTRODUODENOSCOPY N/A 05/07/2015   Procedure: ESOPHAGOGASTRODUODENOSCOPY (EGD);  Surgeon: Rogene Houston, MD;  Location: [facility];  Service: Endoscopy;  Laterality: N/A;    Allergies  Allergen Reactions  . Codeine Nausea And Vomiting  . Nsaids     Current Outpatient Prescriptions on File Prior to Visit  Medication Sig Dispense Refill  . acetaminophen (TYLENOL) 325 MG tablet Take 650 mg by mouth every 6 (six) hours as needed.    . Amino Acids-Protein Hydrolys (FEEDING SUPPLEMENT, PRO-STAT SUGAR FREE 64,) LIQD ProStat 30 ml TID mix with 4 oz water or juice three times a day between meals    . Balsam [place]-Castor Oil (VENELEX) OINT Apply to sacrum and bilateral buttock every shift and as needed for prevention    . Cholecalciferol 1000 units TBDP Take 1 tablet by mouth daily    . docusate sodium (COLACE) 100 MG capsule Take 100 mg by mouth every morning.    . ferrous sulfate (FERROUSUL) 325 (65 FE) MG tablet Take 325 mg by mouth daily with breakfast.    . losartan (COZAAR) 50 MG tablet Take 50 mg by mouth daily.    . Multiple Vitamins-Minerals (MULTIVITAMIN WITH MINERALS) tablet Take 1 tablet by  mouth daily.    Marland Kitchen omeprazole (PRILOSEC) 20 MG capsule Take 20 mg by mouth daily.    . polyvinyl alcohol (LIQUIFILM TEARS) 1.4 % ophthalmic solution Place 1 drop into both eyes every 4 (four) hours.     . simvastatin (ZOCOR) 10 MG tablet Take 10 mg by mouth at bedtime.    . traMADol (ULTRAM) 50 MG tablet Take 100 mg by mouth at bedtime.     No current facility-administered medications on file prior to visit.      Review of Systems  Immunization History  Administered Date(s) Administered  . Influenza,inj,Quad PF,36+ Mos 05/04/2015  . Influenza-Unspecified 05/28/2016    . Pneumococcal-Unspecified 06/10/2016   Pertinent  Health Maintenance Due  Topic Date Due  . DEXA SCAN  02/09/2017 (Originally 11/30/1986)  . PNA vac Low Risk Adult (2 of 2 - PCV13) 06/10/2017  . INFLUENZA VACCINE  Completed   No flowsheet data found. Functional Status Survey:    There were no vitals filed for this visit. There is no height or weight on file to calculate BMI. Physical Exam  Labs reviewed:  Recent Labs  06/10/16 0500 07/12/16 0700 08/05/16 0700  NA 136 138 136  K 4.2 3.6 4.0  CL 102 103 102  CO2 28 27 25   GLUCOSE 96 92 100*  BUN 30* 28* 18  CREATININE 0.73 0.75 0.72  CALCIUM 9.1 8.8* 9.2    Recent Labs  02/11/16 0705 03/31/16 0655 04/13/16 0720 06/10/16 0500  AST 30 14*  --  17  ALT 18 9*  --  11*  ALKPHOS 43 40  --  41  BILITOT 0.8 0.3  --  0.4  PROT 6.1* 6.4*  --  7.1  ALBUMIN 2.4* 2.1* 2.2* 2.9*    Recent Labs  04/29/16 0800 05/21/16 1200 06/10/16 0500 07/12/16 0700 08/05/16 0700  WBC 5.6 5.6 5.6 5.9 5.6  NEUTROABS 2.8 3.0  --  3.3  --   HGB 8.4* 8.9* 9.6* 10.0* 10.5*  HCT 25.1* 28.2* 29.9* 31.2* 32.9*  MCV 87.2 89.8 90.6 89.7 89.6  PLT 337 368 295 326 341   Lab Results  Component Value Date   TSH 13.282 (H) 08/05/2016   No results found for: HGBA1C Lab Results  Component Value Date   CHOL 114 06/10/2016   HDL 60 06/10/2016   LDLCALC 42 06/10/2016   TRIG 60 06/10/2016   CHOLHDL 1.9 06/10/2016    Significant Diagnostic Results in last 30 days:  No results found.  Assessment/Plan There are no diagnoses linked to this encounter.   Family/ staff Communication:  Labs/tests ordered:

## 2016-08-25 NOTE — Progress Notes (Signed)
This encounter was created in error - please disregard.

## 2016-08-26 DIAGNOSIS — I70203 Unspecified atherosclerosis of native arteries of extremities, bilateral legs: Secondary | ICD-10-CM | POA: Diagnosis not present

## 2016-08-26 DIAGNOSIS — B351 Tinea unguium: Secondary | ICD-10-CM | POA: Diagnosis not present

## 2016-08-26 DIAGNOSIS — I739 Peripheral vascular disease, unspecified: Secondary | ICD-10-CM | POA: Diagnosis not present

## 2016-08-26 DIAGNOSIS — M79674 Pain in right toe(s): Secondary | ICD-10-CM | POA: Diagnosis not present

## 2016-09-01 ENCOUNTER — Non-Acute Institutional Stay (SKILLED_NURSING_FACILITY): Payer: Medicare Other | Admitting: Internal Medicine

## 2016-09-01 ENCOUNTER — Other Ambulatory Visit (HOSPITAL_COMMUNITY)
Admission: AD | Admit: 2016-09-01 | Discharge: 2016-09-01 | Disposition: A | Discharge status: Home / Self Care | Payer: Medicare Other | Source: Skilled Nursing Facility | Attending: Internal Medicine | Admitting: Internal Medicine

## 2016-09-01 DIAGNOSIS — F0390 Unspecified dementia without behavioral disturbance: Secondary | ICD-10-CM | POA: Diagnosis not present

## 2016-09-01 DIAGNOSIS — I10 Essential (primary) hypertension: Secondary | ICD-10-CM | POA: Insufficient documentation

## 2016-09-01 DIAGNOSIS — Z9181 History of falling: Secondary | ICD-10-CM | POA: Diagnosis not present

## 2016-09-01 DIAGNOSIS — Z48815 Encounter for surgical aftercare following surgery on the digestive system: Secondary | ICD-10-CM | POA: Diagnosis not present

## 2016-09-01 DIAGNOSIS — R509 Fever, unspecified: Secondary | ICD-10-CM | POA: Diagnosis not present

## 2016-09-01 DIAGNOSIS — R5383 Other fatigue: Secondary | ICD-10-CM

## 2016-09-01 LAB — COMPREHENSIVE METABOLIC PANEL
ALK PHOS: 47 U/L (ref 38–126)
ALT: 12 U/L — AB (ref 14–54)
ANION GAP: 7 (ref 5–15)
AST: 18 U/L (ref 15–41)
Albumin: 2.8 g/dL — ABNORMAL LOW (ref 3.5–5.0)
BILIRUBIN TOTAL: 0.2 mg/dL — AB (ref 0.3–1.2)
BUN: 27 mg/dL — ABNORMAL HIGH (ref 6–20)
CALCIUM: 8.9 mg/dL (ref 8.9–10.3)
CO2: 26 mmol/L (ref 22–32)
CREATININE: 0.81 mg/dL (ref 0.44–1.00)
Chloride: 109 mmol/L (ref 101–111)
Glucose, Bld: 110 mg/dL — ABNORMAL HIGH (ref 65–99)
Potassium: 4 mmol/L (ref 3.5–5.1)
Sodium: 142 mmol/L (ref 135–145)
TOTAL PROTEIN: 7.2 g/dL (ref 6.5–8.1)

## 2016-09-01 LAB — CBC WITH DIFFERENTIAL/PLATELET
Basophils Absolute: 0 10*3/uL (ref 0.0–0.1)
Basophils Relative: 0 %
EOS ABS: 0.1 10*3/uL (ref 0.0–0.7)
Eosinophils Relative: 3 %
HCT: 30 % — ABNORMAL LOW (ref 36.0–46.0)
HEMOGLOBIN: 9.7 g/dL — AB (ref 12.0–15.0)
LYMPHS PCT: 37 %
Lymphs Abs: 1.9 10*3/uL (ref 0.7–4.0)
MCH: 29.2 pg (ref 26.0–34.0)
MCHC: 32.3 g/dL (ref 30.0–36.0)
MCV: 90.4 fL (ref 78.0–100.0)
MONOS PCT: 11 %
Monocytes Absolute: 0.6 10*3/uL (ref 0.1–1.0)
NEUTROS PCT: 49 %
Neutro Abs: 2.6 10*3/uL (ref 1.7–7.7)
Platelets: 294 10*3/uL (ref 150–400)
RBC: 3.32 MIL/uL — AB (ref 3.87–5.11)
RDW: 15.3 % (ref 11.5–15.5)
WBC: 5.3 10*3/uL (ref 4.0–10.5)

## 2016-09-01 LAB — URINALYSIS, ROUTINE W REFLEX MICROSCOPIC
Bilirubin Urine: NEGATIVE
Glucose, UA: NEGATIVE mg/dL
KETONES UR: NEGATIVE mg/dL
NITRITE: NEGATIVE
PH: 6 (ref 5.0–8.0)
Protein, ur: 100 mg/dL — AB
Specific Gravity, Urine: 1.02 (ref 1.005–1.030)

## 2016-09-01 NOTE — Progress Notes (Signed)
This is an acute visit.  Level care skilled.  Facility [facility].  Chief complaint-acute visit secondary to change in mental status-low-grade temperature.  History of present illness.  Patient is a pleasant 83 year old female who is here for long-term care.  She was initially admitted here in [place] 2017 after hospitalization for biliary obstruction with jaundice significantly elevated liver function tests-she did receive a ERCPas well as sphinectomy her liver function tests have largely normalized  This appears to have largely resolved.  She also has a history of significant dementia but has done well with supportive care she does have a very supportive family as well.  Her daughter who is very attentive feels her mom  not been herself today-somewhat more lethargic-she did eat her supper this evening but apparently her daughter had to be extra diligent  in trying to get her to eat  Vital signs do show temperature 100.0-otherwise appear to be stable.  Her daughter states she did have some cough today.    Past Medical History:  Diagnosis Date  . Anemia   . Arthritis   . Bradycardia   . Cancer [facility])    had breast cancer 8 yrs ago  . 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 GERD (gastroesophageal reflux disease)   . HCAP (healthcare-associated pneumonia)   . Hypertension   . Hypokalemia   . Hypomagnesemia   . Osteoarthritis   . Pancreatitis, acute 05/03/2015  . Thyroid disease   . Urinary tract infection 02/07/16   Coag negative staph. Nitrofurantoin prescribed   . UTI (lower urinary tract infection) 04/03/2016   Proteus treated with Septra DS        Past Surgical History:  Procedure Laterality Date  . BREAST SURGERY    . ERCP N/A 01/22/2016   Procedure: ENDOSCOPIC RETROGRADE CHOLANGIOPANCREATOGRAPHY (ERCP) With biliary sphincterotomy and stone extraction ;  Surgeon: Rogene Houston, MD;  Location: [facility];  Service: Endoscopy;   Laterality: N/A;  . ESOPHAGOGASTRODUODENOSCOPY N/A 05/07/2015   Procedure: ESOPHAGOGASTRODUODENOSCOPY (EGD);  Surgeon: Rogene Houston, MD;  Location[facility];  Service: Endoscopy;  Laterality: N/A;        Allergies  Allergen Reactions  . Codeine Nausea And Vomiting  . Nsaids      Current Outpatient Prescriptions on File Prior to Visit  Medication Sig Dispense Refill  . acetaminophen (TYLENOL) 325 MG tablet Take 650 mg by mouth every 6 (six) hours as needed.    . Amino Acids-Protein Hydrolys (FEEDING SUPPLEMENT, PRO-STAT SUGAR FREE 64,) LIQD ProStat 30 ml TID mix with 4 oz water or juice three times a day between meals    . Balsam [place]-Castor Oil (VENELEX) OINT Apply to sacrum and bilateral buttock every shift and as needed for prevention    . Cholecalciferol 1000 units TBDP Take 1 tablet by mouth daily    . docusate sodium (COLACE) 100 MG capsule Take 100 mg by mouth every morning.    . ferrous sulfate (FERROUSUL) 325 (65 FE) MG tablet Take 325 mg by mouth daily with breakfast.    . levothyroxine (SYNTHROID, LEVOTHROID) 100 MCG tablet     . levothyroxine (SYNTHROID, LEVOTHROID) 125 MCG tablet Take 1/2 of 125 mcg along with 100 mcg tablet once a day    . losartan (COZAAR) 50 MG tablet Take 50 mg by mouth daily.    . Multiple Vitamins-Minerals (MULTIVITAMIN WITH MINERALS) tablet Take 1 tablet by mouth daily.    Marland Kitchen omeprazole (PRILOSEC) 20  MG capsule Take 20 mg by mouth daily.    . polyvinyl alcohol (LIQUIFILM TEARS) 1.4 % ophthalmic solution Place 1 drop into both eyes every 6 (six) hours.    . simvastatin (ZOCOR) 10 MG tablet Take 10 mg by mouth at bedtime.     Review of systems-this is essentially unattainable secondary to dementia please see history of present illness   Physical exam. Vital signs  Reviewed and stable except for low-grade temperature of 99.5 In general this is a frail elderly female she is alert and resting comfortably in  bed.  Her skin is warm and dry.  Eyes pupils appear to be reactive to light visual acuity appears grossly intact.  Oropharynx mucous membranes appear fairly moist she will open her mouth but has difficulty following verbal commands to stick out her tongue.  Her heart is regular rate and rhythm without murmur gallop or rub she has baseline lower extremity edema I would say this is trace to 1+.  Her chest is clear to auscultation with poor respiratory effort there is no labored breathing there is shallow air entry.  Her abdomen is soft does not appear to be tender to palpation there is no grimacing when palpated bowel sounds are positive.  Musculoskeletal does has general weakness lower extremities which is baseline for strength appears to be intact she does move her extremities at baseline.  Neurologic could not really appreciate lateralizing findings or changes from baseline cranial nerves are grossly intact.  Psych she does have significant dementia she is pleasant smiling in fact said goodbye when I left the room.   Labs-.  12-7- 2017.  WBC 5.6 hemoglobin 10.5 platelets 341.   Sodium 136 potassium 4 BUN 18 creatinine 0.72  TSH-13.282   Assessment and plan.  Mental status changes possible increased lethargy with low-grade temperature-will order a chest x-ray-as well as urinalysis and culture-also order a CBC with differential and comprehensive metabolic panel.  This plan was discussed with Dr.Gupta  who is also in the facility.  Clinically she does not appear to be in stable she is alert responsive does not give a septic presentation will await study results  Of note we have obtain the lab work which is reassuring her 1 count is 5.3 hemoglobin relatively stable at 9.7 platelets 294 sodium was 142 potassium 4--creatinine is 0.81 BUN of 27-albumin is 2.8 ALT 12 bilirubin is 0.2 otherwise liver function tests largely within normal limits.  Will await chest x-ray and  urinalysis results as well as also monitor vital signs pulse ox every 4 hours overnight   [id number]

## 2016-09-02 ENCOUNTER — Encounter: Payer: Self-pay | Admitting: Internal Medicine

## 2016-09-02 ENCOUNTER — Non-Acute Institutional Stay (SKILLED_NURSING_FACILITY): Payer: Medicare Other | Admitting: Internal Medicine

## 2016-09-02 ENCOUNTER — Encounter (HOSPITAL_COMMUNITY)
Admission: RE | Admit: 2016-09-02 | Discharge: 2016-09-02 | Disposition: A | Discharge status: Home / Self Care | Payer: Medicare Other | Source: Skilled Nursing Facility | Attending: Internal Medicine | Admitting: Internal Medicine

## 2016-09-02 DIAGNOSIS — N3000 Acute cystitis without hematuria: Secondary | ICD-10-CM

## 2016-09-02 DIAGNOSIS — I4891 Unspecified atrial fibrillation: Secondary | ICD-10-CM | POA: Diagnosis not present

## 2016-09-02 DIAGNOSIS — J189 Pneumonia, unspecified organism: Secondary | ICD-10-CM

## 2016-09-02 DIAGNOSIS — Z48815 Encounter for surgical aftercare following surgery on the digestive system: Secondary | ICD-10-CM | POA: Diagnosis not present

## 2016-09-02 DIAGNOSIS — E039 Hypothyroidism, unspecified: Secondary | ICD-10-CM | POA: Insufficient documentation

## 2016-09-02 DIAGNOSIS — I10 Essential (primary) hypertension: Secondary | ICD-10-CM | POA: Diagnosis not present

## 2016-09-02 DIAGNOSIS — F0390 Unspecified dementia without behavioral disturbance: Secondary | ICD-10-CM | POA: Diagnosis not present

## 2016-09-02 DIAGNOSIS — Z9181 History of falling: Secondary | ICD-10-CM | POA: Diagnosis not present

## 2016-09-02 LAB — TSH: TSH: 3.876 u[IU]/mL (ref 0.350–4.500)

## 2016-09-02 NOTE — Progress Notes (Signed)
Location:   [facility] Room Number: 157/W Place of Service:  SNF (31) Provider:  Geralyn Flash, MD  Patient Care Team: [name], MD as PCP - General (Internal Medicine)  Extended Emergency Contact Information Primary Emergency Contact: [name] Address: [address]          [place],  [place] Home Phone: [phone] Relation: None Secondary Emergency Contact: [place], [address] [address] [place] Phone: [phone] Relation: Grandson  Code Status:  DNR Goals of care: Advanced Directive information Advanced Directives 09/02/2016  Does Patient Have a Medical Advance Directive? Yes  Type of Advance Directive Out of facility DNR (pink MOST or yellow form)  Does patient want to make changes to medical advance directive? No - Patient declined  Copy of [place] in Chart? -  Would patient like information on creating a medical advance directive? -  Pre-existing out of facility DNR order (yellow form or pink MOST form) -     Chief Complaint  Patient presents with  . Acute Visit    Pneumonia and UTI    HPI:  Pt is a 83 y.o. female seen today for an acute visit for  Follow up on her labs.  Patient has h/o Dementia, hypothyroidism, Hypertension, And anemia. And is Long term resident of facility Patient was seen yesterday by Arlo at request of her daughter who thought that patient has been coughing and seems little lethargic. Patient is unable to give any history. She also had fever of 100. According to the nurses patient had been eating. Did not seem to be in any distress.  Right now patient was sitting in the wheel chair is alert but unable to give any history to me. Her grandson is with her and says that he has been feeding her and she is eating well..   Past Medical History:  Diagnosis Date  . Anemia   . Arthritis   . Bradycardia   . Cancer [facility])    had breast  cancer 8 yrs ago  . 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 GERD (gastroesophageal reflux disease)   . HCAP (healthcare-associated pneumonia)   . Hypertension   . Hypokalemia   . Hypomagnesemia   . Osteoarthritis   . Pancreatitis, acute 05/03/2015  . Thyroid disease   . Urinary tract infection 02/07/16   Coag negative staph. Nitrofurantoin prescribed   . UTI (lower urinary tract infection) 04/03/2016   Proteus treated with Septra DS   Past Surgical History:  Procedure Laterality Date  . BREAST SURGERY    . ERCP N/A 01/22/2016   Procedure: ENDOSCOPIC RETROGRADE CHOLANGIOPANCREATOGRAPHY (ERCP) With biliary sphincterotomy and stone extraction ;  Surgeon: Rogene Houston, MD;  Location: [facility] ORS;  Service: Endoscopy;  Laterality: N/A;  . ESOPHAGOGASTRODUODENOSCOPY N/A 05/07/2015   Procedure: ESOPHAGOGASTRODUODENOSCOPY (EGD);  Surgeon: Rogene Houston, MD;  Location: [facility] ENDO SUITE;  Service: Endoscopy;  Laterality: N/A;    Allergies  Allergen Reactions  . Codeine Nausea And Vomiting  . Nsaids     Current Outpatient Prescriptions on File Prior to Visit  Medication Sig Dispense Refill  . acetaminophen (TYLENOL) 325 MG tablet Take 650 mg by mouth every 6 (six) hours as needed.    . Amino Acids-Protein Hydrolys (FEEDING SUPPLEMENT, PRO-STAT SUGAR FREE 64,) LIQD ProStat 30 ml TID mix with 4 oz water or juice three times  a day between meals    . Balsam [place]-Castor Oil (VENELEX) OINT Apply to sacrum and bilateral buttock every shift and as needed for prevention    . Cholecalciferol 1000 units TBDP Take 1 tablet by mouth daily    . docusate sodium (COLACE) 100 MG capsule Take 100 mg by mouth every morning.    . ferrous sulfate (FERROUSUL) 325 (65 FE) MG tablet Take 325 mg by mouth daily with breakfast.    . levothyroxine (SYNTHROID, LEVOTHROID) 175 MCG tablet Take 175 mcg by mouth daily before breakfast.    . losartan (COZAAR) 50 MG tablet Take 50 mg by mouth daily.    .  Melatonin 3 MG TABS Take 3 mg by mouth at bedtime as needed.    . Multiple Vitamins-Minerals (MULTIVITAMIN WITH MINERALS) tablet Take 1 tablet by mouth daily.    Marland Kitchen omeprazole (PRILOSEC) 20 MG capsule Take 20 mg by mouth daily.    . polyvinyl alcohol (LIQUIFILM TEARS) 1.4 % ophthalmic solution Place 1 drop into both eyes every 4 (four) hours.     . simvastatin (ZOCOR) 10 MG tablet Take 10 mg by mouth at bedtime.    . traMADol (ULTRAM) 50 MG tablet Take 100 mg by mouth at bedtime.     No current facility-administered medications on file prior to visit.     Review of Systems  Unable to perform ROS: Dementia    Immunization History  Administered Date(s) Administered  . Influenza,inj,Quad PF,36+ Mos 05/04/2015  . Influenza-Unspecified 05/28/2016  . Pneumococcal-Unspecified 06/10/2016   Pertinent  Health Maintenance Due  Topic Date Due  . DEXA SCAN  02/09/2017 (Originally 11/30/1988)  . PNA vac Low Risk Adult (2 of 2 - PCV13) 06/10/2017  . INFLUENZA VACCINE  Completed   No flowsheet data found. Functional Status Survey:    Vitals:   09/02/16 1211  BP: (!) 123/59  Pulse: 86  Resp: 18  Temp: 97.5 F (36.4 C)  TempSrc: Oral  SpO2: 94%   There is no height or weight on file to calculate BMI. Physical Exam  Constitutional: She appears well-developed and well-nourished.  HENT:  Head: Normocephalic.  Mouth/Throat: Oropharynx is clear and moist.  Eyes: Pupils are equal, round, and reactive to light.  Neck: Neck supple.  Cardiovascular: Normal rate and regular rhythm.   Murmur heard. Pulmonary/Chest: Effort normal. She has rales.  Bilateral Left more then right. Mostly in the bases.   Abdominal: Soft. Bowel sounds are normal. She exhibits no distension. There is no tenderness. There is no rebound.  Musculoskeletal: She exhibits no edema.  Neurological: She is alert.  Actually more alert then usual. But not oriented.  Skin: Skin is warm and dry.    Labs reviewed:  Recent  Labs  07/12/16 0700 08/05/16 0700 09/01/16 1700  NA 138 136 142  K 3.6 4.0 4.0  CL 103 102 109  CO2 27 25 26   GLUCOSE 92 100* 110*  BUN 28* 18 27*  CREATININE 0.75 0.72 0.81  CALCIUM 8.8* 9.2 8.9    Recent Labs  03/31/16 0655 04/13/16 0720 06/10/16 0500 09/01/16 1700  AST 14*  --  17 18  ALT 9*  --  11* 12*  ALKPHOS 40  --  41 47  BILITOT 0.3  --  0.4 0.2*  PROT 6.4*  --  7.1 7.2  ALBUMIN 2.1* 2.2* 2.9* 2.8*    Recent Labs  05/21/16 1200  07/12/16 0700 08/05/16 0700 09/01/16 1700  WBC 5.6  < > 5.9  5.6 5.3  NEUTROABS 3.0  --  3.3  --  2.6  HGB 8.9*  < > 10.0* 10.5* 9.7*  HCT 28.2*  < > 31.2* 32.9* 30.0*  MCV 89.8  < > 89.7 89.6 90.4  PLT 368  < > 326 341 294  < > = values in this interval not displayed. Lab Results  Component Value Date   TSH 13.282 (H) 08/05/2016   No results found for: HGBA1C Lab Results  Component Value Date   CHOL 114 06/10/2016   HDL 60 06/10/2016   LDLCALC 42 06/10/2016   TRIG 60 06/10/2016   CHOLHDL 1.9 06/10/2016    Significant Diagnostic Results in last 30 days:  No results found.  Assessment/Plan HCAP Left lower lobe Pneumonia Patient is afebrile. White count is normal. Will start her on Levaquin for 10 days. POX QD Vitals Q shift. Encourage PO intake.  Essential hypertension BP Controlled. No change required  Acute cystitis without hematuria Her UA was positive for Leucocytes. Culture is pending . Levaquin should cover that infection also.  Hypothyroidism  TSH is pending. Last TSH was high but trending down.    Family/ staff Communication:   Labs/tests ordered:  POX

## 2016-09-03 ENCOUNTER — Encounter: Payer: Self-pay | Admitting: Internal Medicine

## 2016-09-03 DIAGNOSIS — M6281 Muscle weakness (generalized): Secondary | ICD-10-CM | POA: Diagnosis not present

## 2016-09-03 DIAGNOSIS — F0390 Unspecified dementia without behavioral disturbance: Secondary | ICD-10-CM | POA: Diagnosis not present

## 2016-09-03 DIAGNOSIS — M1990 Unspecified osteoarthritis, unspecified site: Secondary | ICD-10-CM | POA: Diagnosis not present

## 2016-09-03 DIAGNOSIS — R279 Unspecified lack of coordination: Secondary | ICD-10-CM | POA: Diagnosis not present

## 2016-09-03 NOTE — Progress Notes (Signed)
   This is an acute visit.  Level care skilled.  Facility is [facility].  Chief complaint-acute visit follow-up exudate from eyes.   History of present illness.  Patient is a 11  This encounter was created in error - please disregard.

## 2016-09-04 LAB — URINE CULTURE

## 2016-09-07 DIAGNOSIS — M1990 Unspecified osteoarthritis, unspecified site: Secondary | ICD-10-CM | POA: Diagnosis not present

## 2016-09-07 DIAGNOSIS — R279 Unspecified lack of coordination: Secondary | ICD-10-CM | POA: Diagnosis not present

## 2016-09-07 DIAGNOSIS — F0390 Unspecified dementia without behavioral disturbance: Secondary | ICD-10-CM | POA: Diagnosis not present

## 2016-09-07 DIAGNOSIS — M6281 Muscle weakness (generalized): Secondary | ICD-10-CM | POA: Diagnosis not present

## 2016-09-08 DIAGNOSIS — R279 Unspecified lack of coordination: Secondary | ICD-10-CM | POA: Diagnosis not present

## 2016-09-08 DIAGNOSIS — F0390 Unspecified dementia without behavioral disturbance: Secondary | ICD-10-CM | POA: Diagnosis not present

## 2016-09-08 DIAGNOSIS — M1990 Unspecified osteoarthritis, unspecified site: Secondary | ICD-10-CM | POA: Diagnosis not present

## 2016-09-08 DIAGNOSIS — M6281 Muscle weakness (generalized): Secondary | ICD-10-CM | POA: Diagnosis not present

## 2016-09-09 DIAGNOSIS — F0390 Unspecified dementia without behavioral disturbance: Secondary | ICD-10-CM | POA: Diagnosis not present

## 2016-09-09 DIAGNOSIS — R279 Unspecified lack of coordination: Secondary | ICD-10-CM | POA: Diagnosis not present

## 2016-09-09 DIAGNOSIS — M1990 Unspecified osteoarthritis, unspecified site: Secondary | ICD-10-CM | POA: Diagnosis not present

## 2016-09-09 DIAGNOSIS — M6281 Muscle weakness (generalized): Secondary | ICD-10-CM | POA: Diagnosis not present

## 2016-09-12 IMAGING — US US ABDOMEN LIMITED
1 series · 14 of 25 positions shown · non-contrast
Comparison: CT 05/03/2015 and MRI 05/06/2015

CLINICAL DATA: Nausea and vomiting since this morning.

EXAM:
US ABDOMEN LIMITED - RIGHT UPPER QUADRANT

[Series 1: us abdomen limited · 0.25mm/px · 14 of 45 slices shown]
[im 1/45]
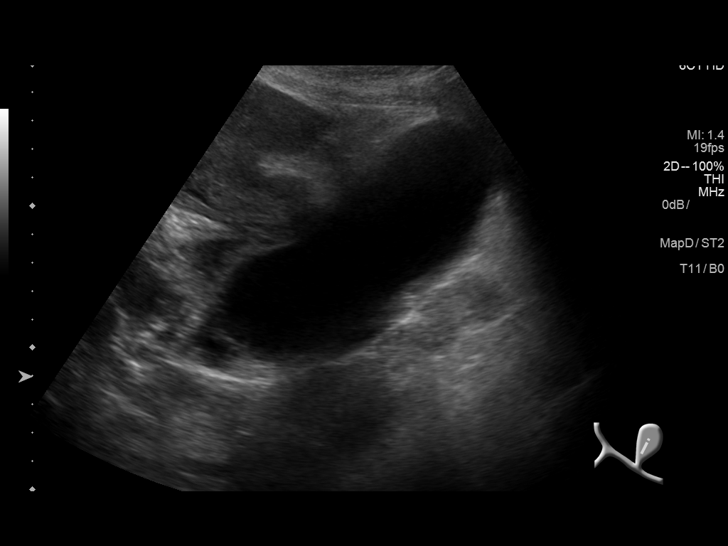
[im 4/45]
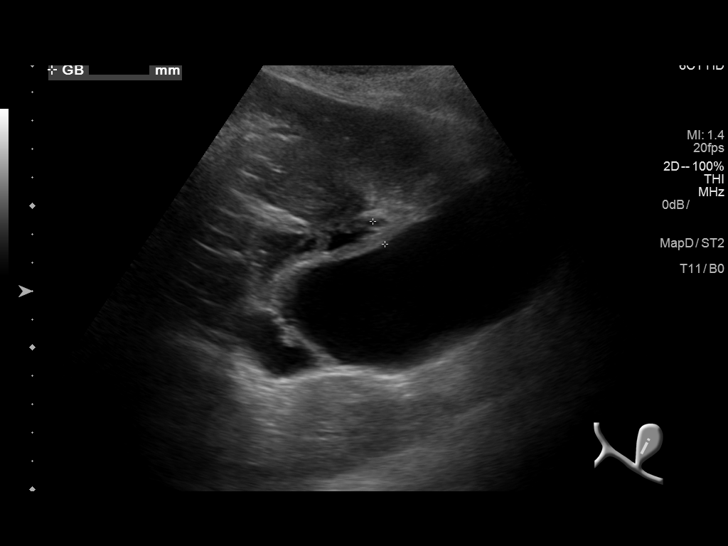
[im 8/45]
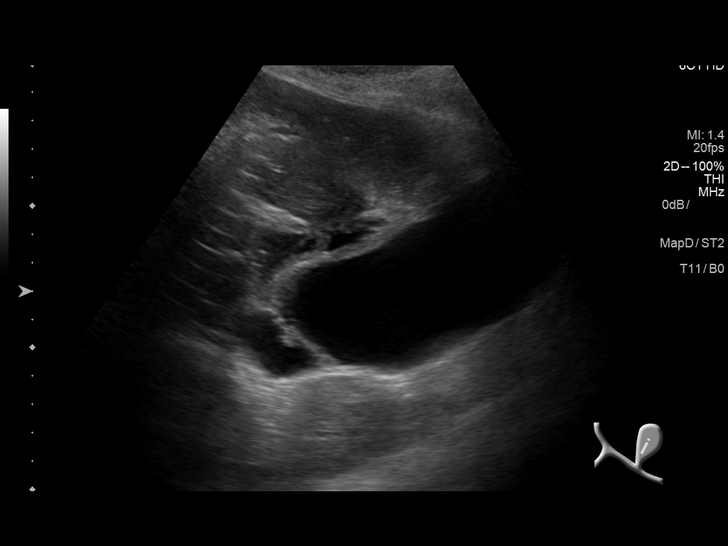
[im 12/45]
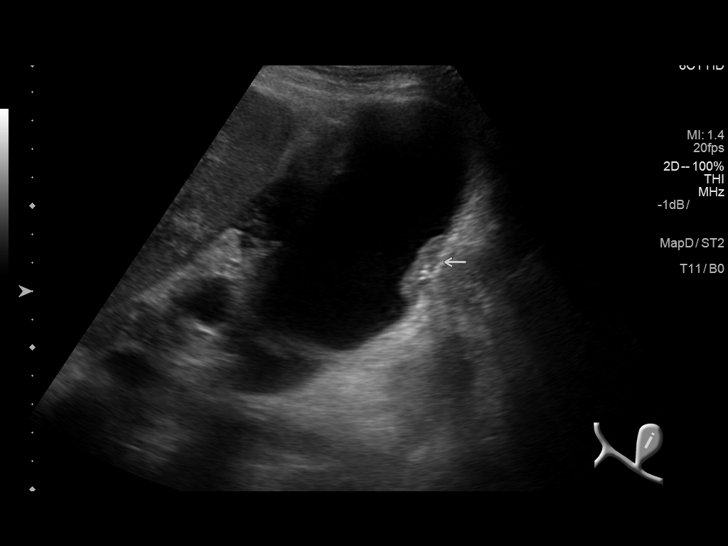
[im 15/45]
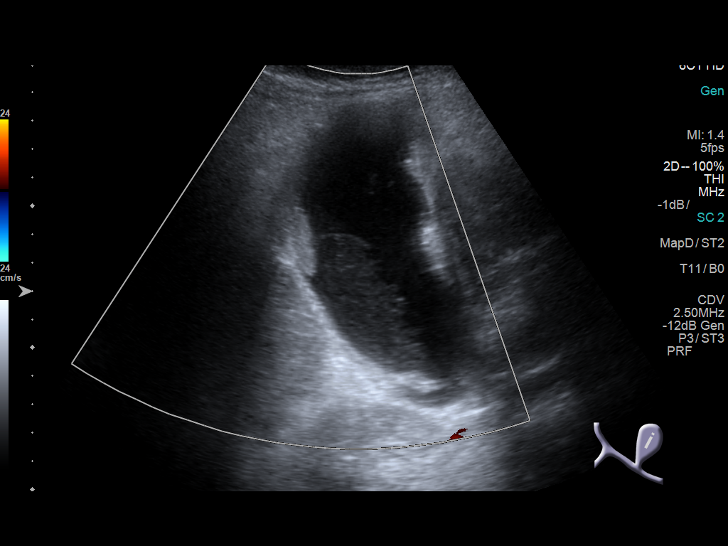
[im 17/45]
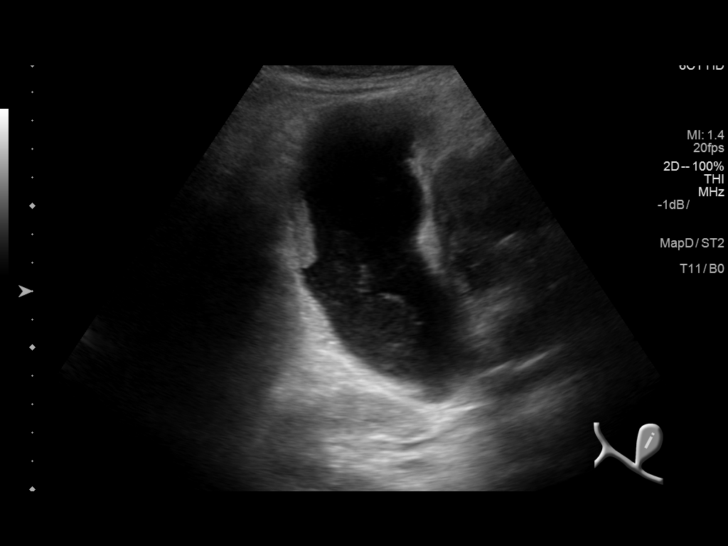
[im 21/45]
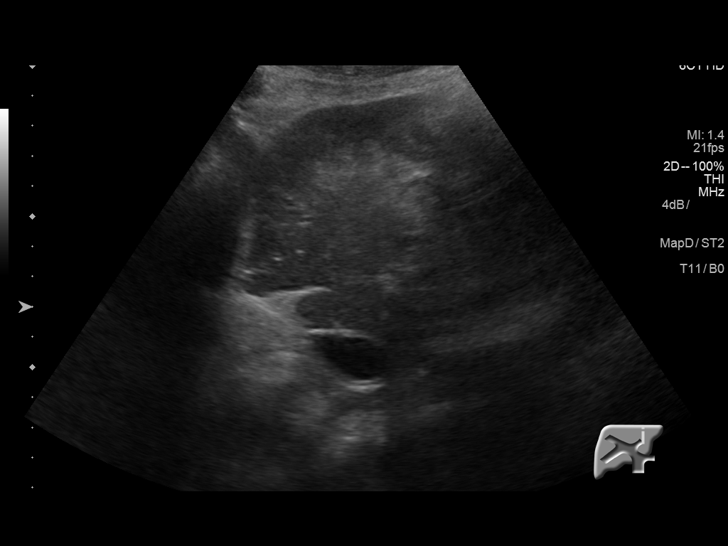
[im 24/45]
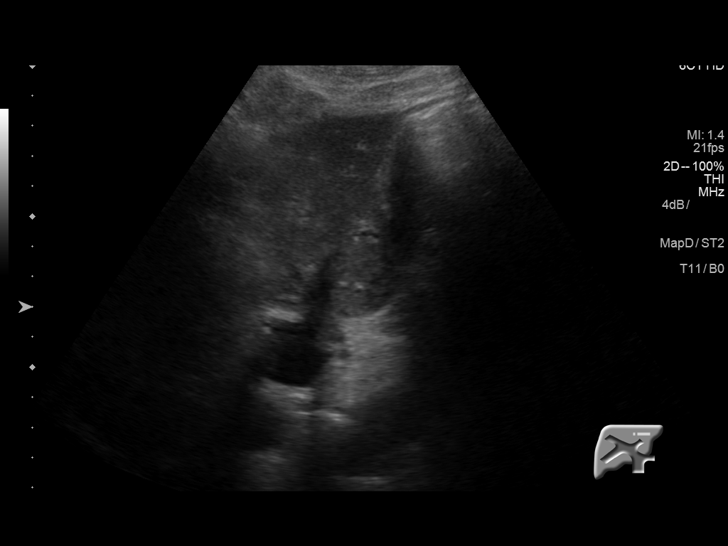
[im 28/45]
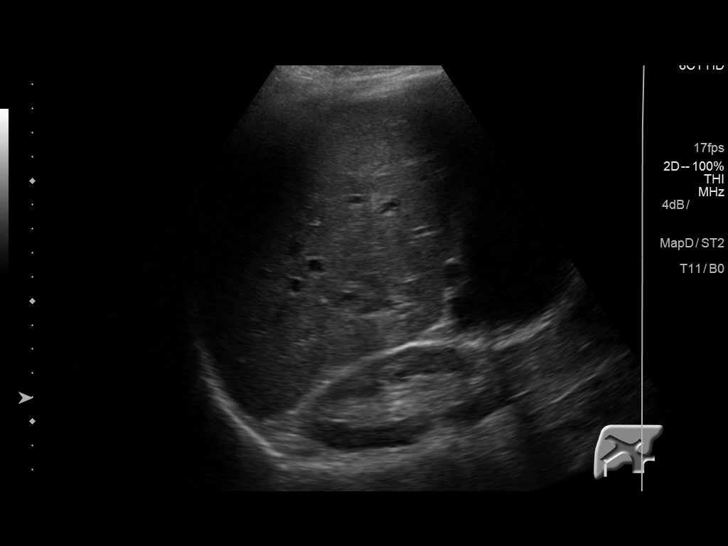
[im 30/45]
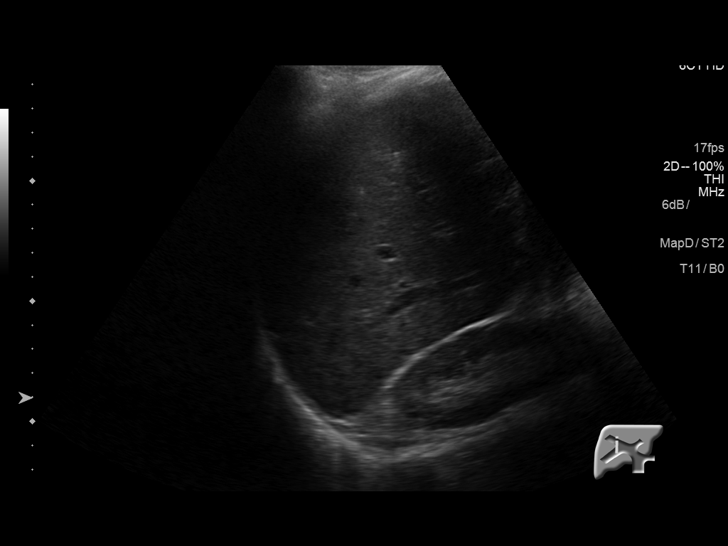
[im 34/45]
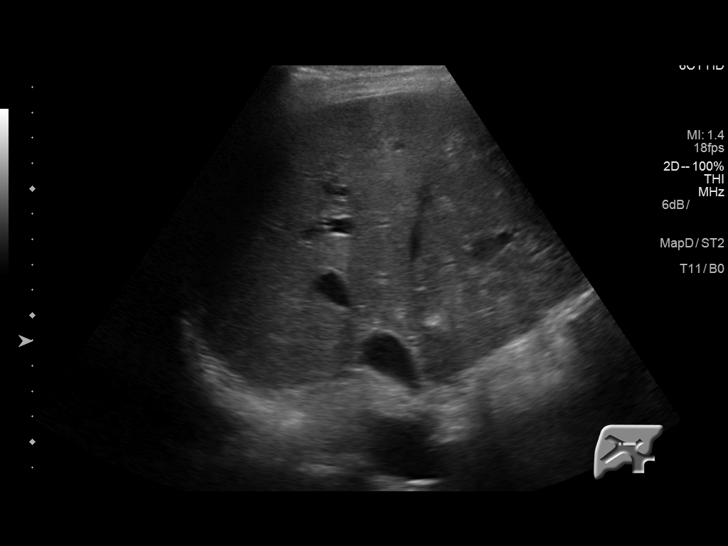
[im 37/45]
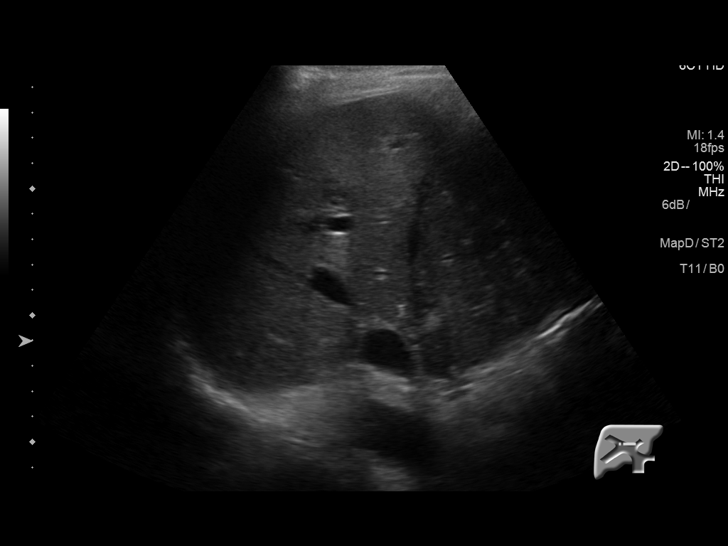
[im 41/45]
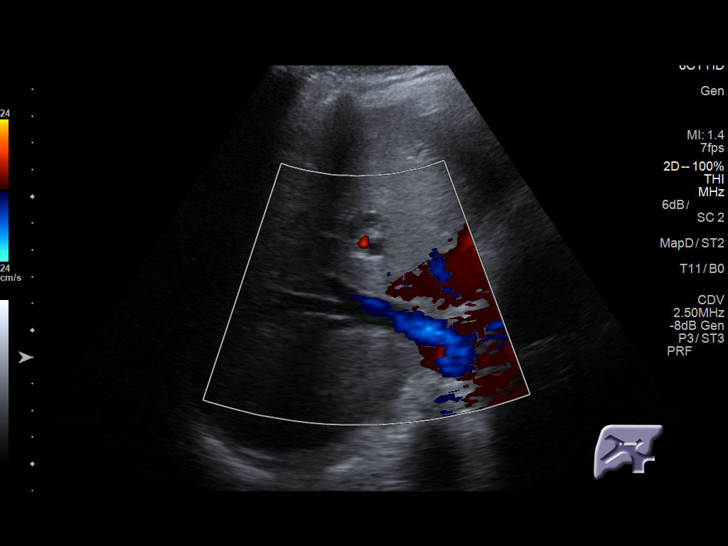
[im 45/45]
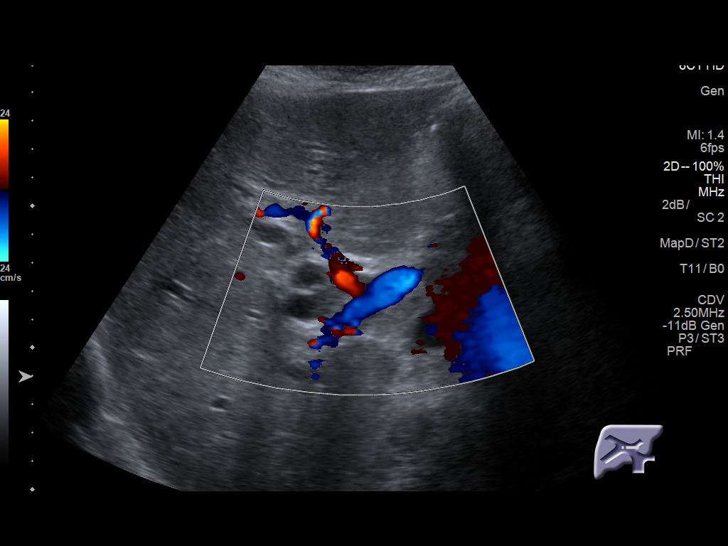

[14 of 25 positions shown; findings below may reference images not displayed]

FINDINGS: Gallbladder:

Hydropic gallbladder measuring 11.2 cm in length. Moderate
gallbladder sludge with several small stones the largest measuring 3
mm. There is wall thickening measuring up to 9 mm with
pericholecystic fluid. Positive sonographic Murphy's sign.

Common bile duct:

Diameter: 18 mm without evidence of choledocholithiasis.

Liver:

Center intrahepatic duct dilatation.
IMPRESSION: Dilated gallbladder with constellation of findings which may be due
to acute cholecystitis. Moderate dilatation of the common bile duct
without evidence of ductal stones. Intrahepatic ductal dilatation.
Further evaluation with cross-sectional imaging may be helpful to
exclude a more distal obstructing process at the level of the head
of the pancreas/ampulla.

## 2016-09-15 DIAGNOSIS — M6281 Muscle weakness (generalized): Secondary | ICD-10-CM | POA: Diagnosis not present

## 2016-09-15 DIAGNOSIS — M1990 Unspecified osteoarthritis, unspecified site: Secondary | ICD-10-CM | POA: Diagnosis not present

## 2016-09-15 DIAGNOSIS — F0390 Unspecified dementia without behavioral disturbance: Secondary | ICD-10-CM | POA: Diagnosis not present

## 2016-09-15 DIAGNOSIS — R279 Unspecified lack of coordination: Secondary | ICD-10-CM | POA: Diagnosis not present

## 2016-09-16 ENCOUNTER — Encounter (HOSPITAL_COMMUNITY)
Admission: RE | Admit: 2016-09-16 | Discharge: 2016-09-16 | Disposition: A | Discharge status: Home / Self Care | Payer: Medicare Other | Source: Skilled Nursing Facility | Attending: Internal Medicine | Admitting: Internal Medicine

## 2016-09-16 DIAGNOSIS — I4891 Unspecified atrial fibrillation: Secondary | ICD-10-CM | POA: Insufficient documentation

## 2016-09-16 DIAGNOSIS — Z9181 History of falling: Secondary | ICD-10-CM | POA: Insufficient documentation

## 2016-09-16 DIAGNOSIS — Z48815 Encounter for surgical aftercare following surgery on the digestive system: Secondary | ICD-10-CM | POA: Diagnosis not present

## 2016-09-16 DIAGNOSIS — E039 Hypothyroidism, unspecified: Secondary | ICD-10-CM | POA: Diagnosis not present

## 2016-09-16 LAB — TSH: TSH: 2.408 u[IU]/mL (ref 0.350–4.500)

## 2016-09-21 ENCOUNTER — Non-Acute Institutional Stay (SKILLED_NURSING_FACILITY): Payer: Medicare Other | Admitting: Internal Medicine

## 2016-09-21 ENCOUNTER — Encounter: Payer: Self-pay | Admitting: Internal Medicine

## 2016-09-21 DIAGNOSIS — F0280 Dementia in other diseases classified elsewhere without behavioral disturbance: Secondary | ICD-10-CM | POA: Diagnosis not present

## 2016-09-21 DIAGNOSIS — E039 Hypothyroidism, unspecified: Secondary | ICD-10-CM

## 2016-09-21 DIAGNOSIS — G309 Alzheimer's disease, unspecified: Secondary | ICD-10-CM | POA: Diagnosis not present

## 2016-09-21 DIAGNOSIS — D509 Iron deficiency anemia, unspecified: Secondary | ICD-10-CM | POA: Diagnosis not present

## 2016-09-21 DIAGNOSIS — I10 Essential (primary) hypertension: Secondary | ICD-10-CM

## 2016-09-21 DIAGNOSIS — K831 Obstruction of bile duct: Secondary | ICD-10-CM

## 2016-09-21 NOTE — Progress Notes (Signed)
Location:   [facility] Room Number: 157/W Place of Service:  SNF (31) Provider:  [name], MD  Patient Care Team: [name], MD as PCP - General (Internal Medicine)  Extended Emergency Contact Information Primary Emergency Contact: [name] Address: [address]          [place],  [place] Home Phone: [phone] Relation: None Secondary Emergency Contact: [place], [place] [address] [name] Phone: [phone] Relation: Grandson  Code Status:  DNR Goals of care: Advanced Directive information Advanced Directives 09/21/2016  Does Patient Have a Medical Advance Directive? Yes  Type of Advance Directive Out of facility DNR (pink MOST or yellow form)  Does patient want to make changes to medical advance directive? No - Patient declined  Copy of [place] in Chart? -  Would patient like information on creating a medical advance directive? -  Pre-existing out of facility DNR order (yellow form or pink MOST form) -     Chief Complaint  Patient presents with  . Medical Management of Chronic Issues    Routine Visit  For medical management of chronic medical issues including dementia-anemia-hypothyroidism-hypertension-history of biliary obstruction-hyperlipidemia-  HPI:  Pt is a 83 y.o. female seen today for medical management of chronic diseases. As noted above.  She was seen earlier this month for altered mental status lethargy found to have a left lower lobe infiltrate as well as UTI this is been treated and she appears to be doing better she is back at her baseline per staff and her daughter.  Currently she is resting in bed comfortably her vital signs are stable she is afebrile.  She initially admitted here after hospitalization for a biliary obstruction-she had jaundiced she is status post ERCP and Sphinectomy  --at one point her liver function tests were  elevated but these have normalized and have been added appears for some time.  She is not complaining of abdominal pain or weight is stable her appetite apparently is decent.  She does have a history of anemia she has been started on a proton pump inhibitor as well as iron she is not thought to be a good candidate for an aggressive workup most recent hemoglobin was 9.7 with a slightly down from previous levels which have been in the 10 range will update this.  She also has a history hypothyroidism is on Synthroid 175 g a day this is stable with a TSH earlier this month of 2.408.  She also has a history of dementia again her weight is stable she appears to be doing well with supportive care she also has a very supportive family who is almost always with her at some point during the day.  In regards to hypertension she is on valsartan 50 mg a day this appears to be stable recent blood pressures 136/64-118/78 I do see a 150/84 but this does not appear to be the normal reading.  She also recently complained of insomnia she's been started on melatonin.       Past Medical History:  Diagnosis Date  . Anemia   . Arthritis   . Bradycardia   . Cancer [facility])    had breast cancer 8 yrs ago  . 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 GERD (gastroesophageal reflux disease)   . HCAP (healthcare-associated pneumonia)   . Hypertension   . Hypokalemia   .  Hypomagnesemia   . Osteoarthritis   . Pancreatitis, acute 05/03/2015  . Thyroid disease   . Urinary tract infection 02/07/16   Coag negative staph. Nitrofurantoin prescribed   . UTI (lower urinary tract infection) 04/03/2016   Proteus treated with Septra DS   Past Surgical History:  Procedure Laterality Date  . BREAST SURGERY    . ERCP N/A 01/22/2016   Procedure: ENDOSCOPIC RETROGRADE CHOLANGIOPANCREATOGRAPHY (ERCP) With biliary sphincterotomy and stone extraction ;  Surgeon: Rogene Houston, MD;  Location: [facility] ORS;  Service:  Endoscopy;  Laterality: N/A;  . ESOPHAGOGASTRODUODENOSCOPY N/A 05/07/2015   Procedure: ESOPHAGOGASTRODUODENOSCOPY (EGD);  Surgeon: Rogene Houston, MD;  Location: [facility] ENDO [facility];  Service: Endoscopy;  Laterality: N/A;    Allergies  Allergen Reactions  . Codeine Nausea And Vomiting  . Nsaids     Current Outpatient Prescriptions on File Prior to Visit  Medication Sig Dispense Refill  . acetaminophen (TYLENOL) 325 MG tablet Take 650 mg by mouth every 6 (six) hours as needed.    . Amino Acids-Protein Hydrolys (FEEDING SUPPLEMENT, PRO-STAT SUGAR FREE 64,) LIQD ProStat 30 ml TID mix with 4 oz water or juice three times a day between meals    . Balsam [place]-Castor Oil (VENELEX) OINT Apply to sacrum and bilateral buttock every shift and as needed for prevention    . Cholecalciferol 1000 units TBDP Take 1 tablet by mouth daily    . docusate sodium (COLACE) 100 MG capsule Take 100 mg by mouth every morning.    . ferrous sulfate (FERROUSUL) 325 (65 FE) MG tablet Take 325 mg by mouth daily with breakfast.    . levothyroxine (SYNTHROID, LEVOTHROID) 175 MCG tablet Take 175 mcg by mouth daily before breakfast.    . losartan (COZAAR) 50 MG tablet Take 50 mg by mouth daily.    . Melatonin 3 MG TABS Take 3 mg by mouth at bedtime as needed.    . Multiple Vitamins-Minerals (MULTIVITAMIN WITH MINERALS) tablet Take 1 tablet by mouth daily.    Marland Kitchen omeprazole (PRILOSEC) 20 MG capsule Take 20 mg by mouth daily.    . polyvinyl alcohol (LIQUIFILM TEARS) 1.4 % ophthalmic solution Place 1 drop into both eyes every 4 (four) hours.     . simvastatin (ZOCOR) 10 MG tablet Take 10 mg by mouth at bedtime.    . traMADol (ULTRAM) 50 MG tablet Take 100 mg by mouth at bedtime.     No current facility-administered medications on file prior to visit.     Review of Systems   Essentially unattainable secondary to dementia please see history of present illness   Immunization History  Administered Date(s) Administered  .  Influenza,inj,Quad PF,36+ Mos 05/04/2015  . Influenza-Unspecified 05/28/2016  . Pneumococcal-Unspecified 06/10/2016   Pertinent  Health Maintenance Due  Topic Date Due  . DEXA SCAN  02/09/2017 (Originally 11/30/1986)  . PNA vac Low Risk Adult (2 of 2 - PCV13) 06/10/2017  . INFLUENZA VACCINE  Completed   No flowsheet data found. Functional Status Survey:    Vitals:   09/21/16 1351  BP: 136/64  Pulse: 72  Resp: 18  Temp: 98.4 F (36.9 C)  TempSrc: Oral  SpO2: 97%  Weight: 119 lb 9.6 oz (54.3 kg)  Height: 5\' 4"  (1.626 m)   Body mass index is 20.53 kg/m. Physical Exam   In general this is a frail elderly female she is alert and resting comfortably in bed.  Her skin is warm and dry.  Eyes pupils appear to  be reactive to light visual acuity appears grossly intact.  Oropharynx mucous membranes appear fairly moist   Her heart is regular rate and rhythm without murmur gallop or rub she has mild  lower extremity edema most prominently of her feet  Her chest is clear to auscultation -- there is no labored breathing there is shallow air entry.  Her abdomen is soft does not appear to be tender -- bowel sounds are positive.  Musculoskeletal does has general weakness lower extremities which is baseline for strength appears to be intact--Limited exam secondary to patient being in bed  Neurologic could not really appreciate lateralizing findings or changes from baseline cranial nerves are grossly intact.  Psych she does have significant dementia she is pleasant smiling and does speak when addressed speech is fairly minimal   Labs-.    Labs reviewed:  Recent Labs  07/12/16 0700 08/05/16 0700 09/01/16 1700  NA 138 136 142  K 3.6 4.0 4.0  CL 103 102 109  CO2 27 25 26   GLUCOSE 92 100* 110*  BUN 28* 18 27*  CREATININE 0.75 0.72 0.81  CALCIUM 8.8* 9.2 8.9    Recent Labs  03/31/16 0655 04/13/16 0720 06/10/16 0500 09/01/16 1700  AST 14*  --  17 18  ALT  9*  --  11* 12*  ALKPHOS 40  --  41 47  BILITOT 0.3  --  0.4 0.2*  PROT 6.4*  --  7.1 7.2  ALBUMIN 2.1* 2.2* 2.9* 2.8*    Recent Labs  05/21/16 1200  07/12/16 0700 08/05/16 0700 09/01/16 1700  WBC 5.6  < > 5.9 5.6 5.3  NEUTROABS 3.0  --  3.3  --  2.6  HGB 8.9*  < > 10.0* 10.5* 9.7*  HCT 28.2*  < > 31.2* 32.9* 30.0*  MCV 89.8  < > 89.7 89.6 90.4  PLT 368  < > 326 341 294  < > = values in this interval not displayed. Lab Results  Component Value Date   TSH 2.408 09/16/2016   No results found for: HGBA1C Lab Results  Component Value Date   CHOL 114 06/10/2016   HDL 60 06/10/2016   LDLCALC 42 06/10/2016   TRIG 60 06/10/2016   CHOLHDL 1.9 06/10/2016    Significant Diagnostic Results in last 30 days:  No results found.  Assessment/Plan  #1-history of dementia-she appears to be doing well with supportive care her weight is stable at this point will monitor.--She continues on supplements including pro-stat  #2 history of biliary obstruction-again this has not really been an issue during her stay here she is asymptomatic liver function tests have been reassuring continue to monitor.  #3-history of anemia again not thought to be a candidate for aggressive workup hemoglobin 9.7 is slightly below recent baseline she is on iron and a proton pump inhibitor will update this.  #4 history hypothyroidism this appears stable with a TSH of 2.408 earlier this month she is on Synthroid 175 g a day.  #5 history of pneumonia-UTI earlier this month these appear to have resolved she is asymptomatic mental status is back at baseline.  #6 hyperlipidemia LDL was 42 on lab done in October 2017 she is on Zocor this appears to be stable.  #7 history of hypertension she is on Losartan this appears to be stable as noted above.  #8 insomnia-this apparently has improved she is receiving melatonin at night.  Of note I note also note for pain management she is on low-dose tramadol  at night and  apparently has tolerated this well.   #9 history of intermittent conjunctivitis at this point her eyes appear to be clear she is on routine artificial tears \ [id number]-of note greater than 35 minutes spent assessing patient-reviewing her chart-reviewing her labs-iand coordinating and formulating a plan of care for numerous diagnoses-of note greater than 50% of time spent coordinating plan of care

## 2016-09-22 ENCOUNTER — Encounter (HOSPITAL_COMMUNITY)
Admission: RE | Admit: 2016-09-22 | Discharge: 2016-09-22 | Disposition: A | Discharge status: Home / Self Care | Payer: Medicare Other | Source: Skilled Nursing Facility | Attending: Internal Medicine | Admitting: Internal Medicine

## 2016-09-22 DIAGNOSIS — Z9181 History of falling: Secondary | ICD-10-CM | POA: Diagnosis not present

## 2016-09-22 DIAGNOSIS — I4891 Unspecified atrial fibrillation: Secondary | ICD-10-CM | POA: Diagnosis not present

## 2016-09-22 DIAGNOSIS — F0390 Unspecified dementia without behavioral disturbance: Secondary | ICD-10-CM | POA: Insufficient documentation

## 2016-09-22 DIAGNOSIS — Z48815 Encounter for surgical aftercare following surgery on the digestive system: Secondary | ICD-10-CM | POA: Insufficient documentation

## 2016-09-22 LAB — CBC WITH DIFFERENTIAL/PLATELET
Basophils Absolute: 0 10*3/uL (ref 0.0–0.1)
Basophils Relative: 0 %
EOS ABS: 0.1 10*3/uL (ref 0.0–0.7)
EOS PCT: 1 %
HCT: 29.2 % — ABNORMAL LOW (ref 36.0–46.0)
Hemoglobin: 9.8 g/dL — ABNORMAL LOW (ref 12.0–15.0)
Lymphocytes Relative: 22 %
Lymphs Abs: 1.1 10*3/uL (ref 0.7–4.0)
MCH: 29.2 pg (ref 26.0–34.0)
MCHC: 33.6 g/dL (ref 30.0–36.0)
MCV: 86.9 fL (ref 78.0–100.0)
Monocytes Absolute: 0.8 10*3/uL (ref 0.1–1.0)
Monocytes Relative: 15 %
Neutro Abs: 3.1 10*3/uL (ref 1.7–7.7)
Neutrophils Relative %: 62 %
PLATELETS: 263 10*3/uL (ref 150–400)
RBC: 3.36 MIL/uL — AB (ref 3.87–5.11)
RDW: 15.1 % (ref 11.5–15.5)
WBC: 4.9 10*3/uL (ref 4.0–10.5)

## 2016-09-27 ENCOUNTER — Non-Acute Institutional Stay (SKILLED_NURSING_FACILITY): Payer: Medicare Other | Admitting: Internal Medicine

## 2016-09-27 ENCOUNTER — Encounter: Payer: Self-pay | Admitting: Internal Medicine

## 2016-09-27 DIAGNOSIS — S31000D Unspecified open wound of lower back and pelvis without penetration into retroperitoneum, subsequent encounter: Secondary | ICD-10-CM

## 2016-09-27 DIAGNOSIS — R05 Cough: Secondary | ICD-10-CM

## 2016-09-27 DIAGNOSIS — R0989 Other specified symptoms and signs involving the circulatory and respiratory systems: Secondary | ICD-10-CM | POA: Diagnosis not present

## 2016-09-27 DIAGNOSIS — R059 Cough, unspecified: Secondary | ICD-10-CM

## 2016-09-27 NOTE — Progress Notes (Signed)
Location:   [place] Room Number: 157/W Place of Service:  SNF (31) Provider:  [name], MD  Patient Care Team: [name], MD as PCP - General (Internal Medicine)  Extended Emergency Contact Information Primary Emergency Contact: [name] Address: [address]          [place],  [place] Home Phone: [phone] Relation: None Secondary Emergency Contact: [place], [place] [address] [name] of [place] Phone: [phone] Relation: Grandson  Code Status:  DNR Goals of care: Advanced Directive information Advanced Directives 09/27/2016  Does Patient Have a Medical Advance Directive? -  Type of Advance Directive Out of facility DNR (pink MOST or yellow form)  Does patient want to make changes to medical advance directive? No - Patient declined  Copy of Nathalie in Chart? -  Would patient like information on creating a medical advance directive? -  Pre-existing out of facility DNR order (yellow form or pink MOST form) -     Chief Complaint  Patient presents with  . Acute Visit    Cough per family  Also complains of a runny nose HPI:  Pt is a 83 y.o. female seen today for an acute visit for an intermittent cough also runny nose.  Her vital signs are stable she is afebrile.  Earlier this month she was treated for a left lower lobe pneumonia with Levaquin and this resolved unremarkably.  Apparently last day or so her family has noted an intermittent cough apparently nonproductive.  They've also noted some drainage from her nose that apparently is clear.  Patient has significant dementia and cannot give any review of systems however she appears to be doing relatively well-in fact she is in the room with her daughter this evening and look like she ate virtually all of her dinner   Past Medical History:  Diagnosis Date  . Anemia   . Arthritis   . Bradycardia   .  Cancer [facility])    had breast cancer 8 yrs ago  . 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 GERD (gastroesophageal reflux disease)   . HCAP (healthcare-associated pneumonia)   . Hypertension   . Hypokalemia   . Hypomagnesemia   . Osteoarthritis   . Pancreatitis, acute 05/03/2015  . Thyroid disease   . Urinary tract infection 02/07/16   Coag negative staph. Nitrofurantoin prescribed   . UTI (lower urinary tract infection) 04/03/2016   Proteus treated with Septra DS   Past Surgical History:  Procedure Laterality Date  . BREAST SURGERY    . ERCP N/A 01/22/2016   Procedure: ENDOSCOPIC RETROGRADE CHOLANGIOPANCREATOGRAPHY (ERCP) With biliary sphincterotomy and stone extraction ;  Surgeon: Rogene Houston, MD;  Location: [facility] ORS;  Service: Endoscopy;  Laterality: N/A;  . ESOPHAGOGASTRODUODENOSCOPY N/A 05/07/2015   Procedure: ESOPHAGOGASTRODUODENOSCOPY (EGD);  Surgeon: Rogene Houston, MD;  Location: [facility] ENDO SUITE;  Service: Endoscopy;  Laterality: N/A;    Allergies  Allergen Reactions  . Codeine Nausea And Vomiting  . Nsaids     Current Outpatient Prescriptions on File Prior to Visit  Medication Sig Dispense Refill  . acetaminophen (TYLENOL) 325 MG tablet Take 650 mg by mouth every 6 (six) hours as needed.    . Amino Acids-Protein Hydrolys (FEEDING SUPPLEMENT, PRO-STAT SUGAR FREE 64,) LIQD ProStat 30 ml TID mix with 4 oz water or juice three times a day between  meals    . Balsam [place]-Castor Oil (VENELEX) OINT Apply to sacrum and bilateral buttock every shift and as needed for prevention    . Cholecalciferol 1000 units TBDP Take 1 tablet by mouth daily    . docusate sodium (COLACE) 100 MG capsule Take 100 mg by mouth every morning.    . ferrous sulfate (FERROUSUL) 325 (65 FE) MG tablet Take 325 mg by mouth daily with breakfast.    . levothyroxine (SYNTHROID, LEVOTHROID) 175 MCG tablet Take 175 mcg by mouth daily before breakfast.    . losartan (COZAAR) 50 MG tablet Take 50 mg  by mouth daily.    . Melatonin 3 MG TABS Take 3 mg by mouth at bedtime as needed.    . Multiple Vitamins-Minerals (MULTIVITAMIN WITH MINERALS) tablet Take 1 tablet by mouth daily.    Marland Kitchen omeprazole (PRILOSEC) 20 MG capsule Take 20 mg by mouth daily.    . polyvinyl alcohol (LIQUIFILM TEARS) 1.4 % ophthalmic solution Place 1 drop into both eyes every 4 (four) hours.     . simvastatin (ZOCOR) 10 MG tablet Take 10 mg by mouth at bedtime.    . traMADol (ULTRAM) 50 MG tablet Take 100 mg by mouth at bedtime.     No current facility-administered medications on file prior to visit.      Review of Systems   Unable to perform secondary to dementia please see history of present illness  Immunization History  Administered Date(s) Administered  . Influenza,inj,Quad PF,36+ Mos 05/04/2015  . Influenza-Unspecified 05/28/2016  . Pneumococcal-Unspecified 06/10/2016   Pertinent  Health Maintenance Due  Topic Date Due  . DEXA SCAN  02/09/2017 (Originally [birth year]30/1986)  . PNA vac Low Risk Adult (2 of 2 - PCV13) 06/10/2017  . INFLUENZA VACCINE  Completed   No flowsheet data found. Functional Status Survey:    Vitals:   09/27/16 1618  BP: 118/72  Pulse: 72  Resp: 20  Temp: 97.8 F (36.6 C)  TempSrc: Oral    Physical Exam   In general this is a frail elderly female she is alert and resting comfortably in bed.  Her skin is warm and dry. She does have a small open area on her sacrum that is currently covered appears to have a small erythematous wound bed there is no drainage or bleeding I could not really appreciate any surrounding firmness or erythema-  Eyes pupils appear to be reactive to light visual acuity appears grossly intact.  Nose-- appears to have a small amount of watery discharge    Oropharynx mucous membranes appear fairly moist   Her heart is regular rate and rhythm with a slight  murmur she has mild  lower extremity edema most prominently of her feet  Her chest is clear  to auscultation -- there is no labored breathing there is shallow air entry.  Her abdomen is soft does not appear to be tender -- bowel sounds are positive.  Musculoskeletal does has general weakness lower extremities which is baseline for strength appears to be intact--Limited exam secondary to patient being in bed  Neurologic could not really appreciate lateralizing findings or changes from baseline cranial nerves are grossly intact.  Psych she does have significant dementia she is pleasant smiling and does speak when addressed --smiled and said goodbye when I left   Labs reviewed:  Recent Labs  07/12/16 0700 08/05/16 0700 09/01/16 1700  NA 138 136 142  K 3.6 4.0 4.0  CL 103 102 109  CO2 27 25  26  GLUCOSE 92 100* 110*  BUN 28* 18 27*  CREATININE 0.75 0.72 0.81  CALCIUM 8.8* 9.2 8.9    Recent Labs  03/31/16 0655 04/13/16 0720 06/10/16 0500 09/01/16 1700  AST 14*  --  17 18  ALT 9*  --  11* 12*  ALKPHOS 40  --  41 47  BILITOT 0.3  --  0.4 0.2*  PROT 6.4*  --  7.1 7.2  ALBUMIN 2.1* 2.2* 2.9* 2.8*    Recent Labs  07/12/16 0700 08/05/16 0700 09/01/16 1700 09/22/16 0700  WBC 5.9 5.6 5.3 4.9  NEUTROABS 3.3  --  2.6 3.1  HGB 10.0* 10.5* 9.7* 9.8*  HCT 31.2* 32.9* 30.0* 29.2*  MCV 89.7 89.6 90.4 86.9  PLT 326 341 294 263   Lab Results  Component Value Date   TSH 2.408 09/16/2016   No results found for: HGBA1C Lab Results  Component Value Date   CHOL 114 06/10/2016   HDL 60 06/10/2016   LDLCALC 42 06/10/2016   TRIG 60 06/10/2016   CHOLHDL 1.9 06/10/2016    Significant Diagnostic Results in last 30 days:  No results found.  Assessment/Plan #1-cough with runny nose-will treat with Mucinex 600 mg twice a day for 5 days also will add Flonase 1 spray each nostril twice a day for 5 days-monitor vital signs pulse ox every shift for 72 hours.  Since she does have a history of pneumonia here will update a chest x-ray to ensure resolution here.  She has  been afebrile appears to be stable again ate very well this evening.  We will update a CBC to look for any elevated white count  #2 in regards to sacral wound this is followed by wound care she is receiving Santyl-I don't see any sign of infection this evening but this will have to be watched-will discuss this with wound care nurse when she is back in facility as well  Also will order a CBC with differential again to look for any elevated white count.  [place], [place], [place]

## 2016-09-28 ENCOUNTER — Encounter (HOSPITAL_COMMUNITY)
Admission: RE | Admit: 2016-09-28 | Discharge: 2016-09-28 | Disposition: A | Discharge status: Home / Self Care | Payer: Medicare Other | Source: Skilled Nursing Facility | Attending: Internal Medicine | Admitting: Internal Medicine

## 2016-09-28 DIAGNOSIS — F0390 Unspecified dementia without behavioral disturbance: Secondary | ICD-10-CM | POA: Diagnosis not present

## 2016-09-28 DIAGNOSIS — Z48815 Encounter for surgical aftercare following surgery on the digestive system: Secondary | ICD-10-CM | POA: Insufficient documentation

## 2016-09-28 DIAGNOSIS — Z9181 History of falling: Secondary | ICD-10-CM | POA: Diagnosis not present

## 2016-09-28 DIAGNOSIS — I4891 Unspecified atrial fibrillation: Secondary | ICD-10-CM | POA: Diagnosis not present

## 2016-09-28 DIAGNOSIS — R05 Cough: Secondary | ICD-10-CM | POA: Diagnosis not present

## 2016-09-28 LAB — CBC WITH DIFFERENTIAL/PLATELET
BASOS ABS: 0.1 10*3/uL (ref 0.0–0.1)
Basophils Relative: 1 %
EOS ABS: 0.3 10*3/uL (ref 0.0–0.7)
Eosinophils Relative: 7 %
HCT: 28.4 % — ABNORMAL LOW (ref 36.0–46.0)
Hemoglobin: 9.3 g/dL — ABNORMAL LOW (ref 12.0–15.0)
Lymphocytes Relative: 43 %
Lymphs Abs: 1.9 10*3/uL (ref 0.7–4.0)
MCH: 29.1 pg (ref 26.0–34.0)
MCHC: 32.7 g/dL (ref 30.0–36.0)
MCV: 88.8 fL (ref 78.0–100.0)
MONO ABS: 0.5 10*3/uL (ref 0.1–1.0)
Monocytes Relative: 12 %
Neutro Abs: 1.6 10*3/uL — ABNORMAL LOW (ref 1.7–7.7)
Neutrophils Relative %: 37 %
PLATELETS: 291 10*3/uL (ref 150–400)
RBC: 3.2 MIL/uL — AB (ref 3.87–5.11)
RDW: 15.3 % (ref 11.5–15.5)
WBC: 4.3 10*3/uL (ref 4.0–10.5)

## 2016-10-05 ENCOUNTER — Encounter (HOSPITAL_COMMUNITY)
Admission: RE | Admit: 2016-10-05 | Discharge: 2016-10-05 | Disposition: A | Discharge status: Home / Self Care | Payer: Medicare Other | Source: Skilled Nursing Facility | Attending: Internal Medicine | Admitting: Internal Medicine

## 2016-10-05 DIAGNOSIS — Z48815 Encounter for surgical aftercare following surgery on the digestive system: Secondary | ICD-10-CM | POA: Diagnosis not present

## 2016-10-05 DIAGNOSIS — Z9181 History of falling: Secondary | ICD-10-CM | POA: Insufficient documentation

## 2016-10-05 DIAGNOSIS — I4891 Unspecified atrial fibrillation: Secondary | ICD-10-CM | POA: Diagnosis not present

## 2016-10-05 DIAGNOSIS — D509 Iron deficiency anemia, unspecified: Secondary | ICD-10-CM | POA: Diagnosis not present

## 2016-10-05 DIAGNOSIS — F0390 Unspecified dementia without behavioral disturbance: Secondary | ICD-10-CM | POA: Insufficient documentation

## 2016-10-05 LAB — CBC
HEMATOCRIT: 29.4 % — AB (ref 36.0–46.0)
HEMOGLOBIN: 9.7 g/dL — AB (ref 12.0–15.0)
MCH: 28.9 pg (ref 26.0–34.0)
MCHC: 33 g/dL (ref 30.0–36.0)
MCV: 87.5 fL (ref 78.0–100.0)
Platelets: 291 10*3/uL (ref 150–400)
RBC: 3.36 MIL/uL — AB (ref 3.87–5.11)
RDW: 15.1 % (ref 11.5–15.5)
WBC: 5.1 10*3/uL (ref 4.0–10.5)

## 2016-10-19 DIAGNOSIS — F0390 Unspecified dementia without behavioral disturbance: Secondary | ICD-10-CM | POA: Diagnosis not present

## 2016-10-19 DIAGNOSIS — M1990 Unspecified osteoarthritis, unspecified site: Secondary | ICD-10-CM | POA: Diagnosis not present

## 2016-10-19 DIAGNOSIS — M6281 Muscle weakness (generalized): Secondary | ICD-10-CM | POA: Diagnosis not present

## 2016-10-19 DIAGNOSIS — R279 Unspecified lack of coordination: Secondary | ICD-10-CM | POA: Diagnosis not present

## 2016-10-20 DIAGNOSIS — R279 Unspecified lack of coordination: Secondary | ICD-10-CM | POA: Diagnosis not present

## 2016-10-20 DIAGNOSIS — M6281 Muscle weakness (generalized): Secondary | ICD-10-CM | POA: Diagnosis not present

## 2016-10-20 DIAGNOSIS — M1990 Unspecified osteoarthritis, unspecified site: Secondary | ICD-10-CM | POA: Diagnosis not present

## 2016-10-20 DIAGNOSIS — F0390 Unspecified dementia without behavioral disturbance: Secondary | ICD-10-CM | POA: Diagnosis not present

## 2016-10-21 DIAGNOSIS — M6281 Muscle weakness (generalized): Secondary | ICD-10-CM | POA: Diagnosis not present

## 2016-10-21 DIAGNOSIS — M1990 Unspecified osteoarthritis, unspecified site: Secondary | ICD-10-CM | POA: Diagnosis not present

## 2016-10-21 DIAGNOSIS — F0390 Unspecified dementia without behavioral disturbance: Secondary | ICD-10-CM | POA: Diagnosis not present

## 2016-10-21 DIAGNOSIS — R279 Unspecified lack of coordination: Secondary | ICD-10-CM | POA: Diagnosis not present

## 2016-10-22 DIAGNOSIS — M6281 Muscle weakness (generalized): Secondary | ICD-10-CM | POA: Diagnosis not present

## 2016-10-22 DIAGNOSIS — M1990 Unspecified osteoarthritis, unspecified site: Secondary | ICD-10-CM | POA: Diagnosis not present

## 2016-10-22 DIAGNOSIS — R279 Unspecified lack of coordination: Secondary | ICD-10-CM | POA: Diagnosis not present

## 2016-10-22 DIAGNOSIS — F0390 Unspecified dementia without behavioral disturbance: Secondary | ICD-10-CM | POA: Diagnosis not present

## 2016-10-25 DIAGNOSIS — F0390 Unspecified dementia without behavioral disturbance: Secondary | ICD-10-CM | POA: Diagnosis not present

## 2016-10-25 DIAGNOSIS — R279 Unspecified lack of coordination: Secondary | ICD-10-CM | POA: Diagnosis not present

## 2016-10-25 DIAGNOSIS — M1990 Unspecified osteoarthritis, unspecified site: Secondary | ICD-10-CM | POA: Diagnosis not present

## 2016-10-25 DIAGNOSIS — M6281 Muscle weakness (generalized): Secondary | ICD-10-CM | POA: Diagnosis not present

## 2016-10-26 ENCOUNTER — Non-Acute Institutional Stay (SKILLED_NURSING_FACILITY): Payer: Medicare Other | Admitting: Internal Medicine

## 2016-10-26 DIAGNOSIS — F0281 Dementia in other diseases classified elsewhere with behavioral disturbance: Secondary | ICD-10-CM

## 2016-10-26 DIAGNOSIS — F0390 Unspecified dementia without behavioral disturbance: Secondary | ICD-10-CM | POA: Diagnosis not present

## 2016-10-26 DIAGNOSIS — G308 Other Alzheimer's disease: Secondary | ICD-10-CM | POA: Diagnosis not present

## 2016-10-26 DIAGNOSIS — F419 Anxiety disorder, unspecified: Secondary | ICD-10-CM | POA: Diagnosis not present

## 2016-10-26 DIAGNOSIS — G309 Alzheimer's disease, unspecified: Principal | ICD-10-CM

## 2016-10-26 DIAGNOSIS — M1990 Unspecified osteoarthritis, unspecified site: Secondary | ICD-10-CM | POA: Diagnosis not present

## 2016-10-26 DIAGNOSIS — M6281 Muscle weakness (generalized): Secondary | ICD-10-CM | POA: Diagnosis not present

## 2016-10-26 DIAGNOSIS — R279 Unspecified lack of coordination: Secondary | ICD-10-CM | POA: Diagnosis not present

## 2016-10-26 NOTE — Progress Notes (Signed)
This is an acute visit.  Level care skilled.  Facility is [facility].  Acute visit secondary to dementia with behavior.  History of present illness.  Patient is a pleasant 83 year old female with a history of dementia and has done quite well with supportive care. Her weight appears to be stable at 120 pounds appear she eats fairly well she has a very supportive family who does help with her feeding.  Apparently her last several days she's had some increased agitation especially at night trying to get out of bed. Apparently also at times somewhat fidgety during the day  Her family would like something to help with the anxiety.  She is a poor historian secondary to dementia but she is not complaining of any pain this evening or burning with urination.  She appears to be at her baseline otherwise.  Past Medical History:  Diagnosis Date  . Anemia   . Arthritis   . Bradycardia   . Cancer [facility])    had breast cancer 8 yrs ago  . 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 GERD (gastroesophageal reflux disease)   . HCAP (healthcare-associated pneumonia)   . Hypertension   . Hypokalemia   . Hypomagnesemia   . Osteoarthritis   . Pancreatitis, acute 05/03/2015  . Thyroid disease   . Urinary tract infection 02/07/16   Coag negative staph. Nitrofurantoin prescribed   . UTI (lower urinary tract infection) 04/03/2016   Proteus treated with Septra DS        Past Surgical History:  Procedure Laterality Date  . BREAST SURGERY    . ERCP N/A 01/22/2016   Procedure: ENDOSCOPIC RETROGRADE CHOLANGIOPANCREATOGRAPHY (ERCP) With biliary sphincterotomy and stone extraction ;  Surgeon: Rogene Houston, MD;  Location: [facility];  Service: Endoscopy;  Laterality: N/A;  . ESOPHAGOGASTRODUODENOSCOPY N/A 05/07/2015   Procedure: ESOPHAGOGASTRODUODENOSCOPY (EGD);  Surgeon: Rogene Houston, MD;  Location: [facility] ENDO [facility];  Service: Endoscopy;  Laterality: N/A;          Allergies  Allergen Reactions  . Codeine Nausea And Vomiting  . Nsaids           Current Outpatient Prescriptions on File Prior to Visit  Medication Sig Dispense Refill  . acetaminophen (TYLENOL) 325 MG tablet Take 650 mg by mouth every 6 (six) hours as needed.    . Amino Acids-Protein Hydrolys (FEEDING SUPPLEMENT, PRO-STAT SUGAR FREE 64,) LIQD ProStat 30 ml TID mix with 4 oz water or juice three times a day between meals    . Balsam [place]-Castor Oil (VENELEX) OINT Apply to sacrum and bilateral buttock every shift and as needed for prevention    . Cholecalciferol 1000 units TBDP Take 1 tablet by mouth daily    . docusate sodium (COLACE) 100 MG capsule Take 100 mg by mouth every morning.    . ferrous sulfate (FERROUSUL) 325 (65 FE) MG tablet Take 325 mg by mouth daily with breakfast.    . levothyroxine (SYNTHROID, LEVOTHROID) 175 MCG tablet Take 175 mcg by mouth daily before breakfast.    . losartan (COZAAR) 50 MG tablet Take 50 mg by mouth daily.    . Melatonin 3 MG TABS Take 3 mg by mouth at bedtime as needed.    . Multiple Vitamins-Minerals (MULTIVITAMIN WITH MINERALS) tablet Take 1 tablet by mouth daily.    Marland Kitchen omeprazole (PRILOSEC) 20 MG capsule Take 20 mg by mouth daily.    . polyvinyl alcohol (LIQUIFILM TEARS) 1.4 % ophthalmic solution  Place 1 drop into both eyes every 4 (four) hours.     . simvastatin (ZOCOR) 10 MG tablet Take 10 mg by mouth at bedtime.    . traMADol (ULTRAM) 50 MG tablet Take 100 mg by mouth at bedtime.     No current facility-administered medications on file prior to visit.      Review of Systems  This is quite limited secondary to dementia but when I asked her she is is not complaining of any pain-does not complain of pain does not complain of any discomfort when urinating or any abdominal discomfort or musculoskeletal discomfort      Immunization History  Administered Date(s) Administered  . Influenza,inj,Quad PF,36+  Mos 05/04/2015  . Influenza-Unspecified 05/28/2016  . Pneumococcal-Unspecified 06/10/2016       Pertinent  Health Maintenance Due  Topic Date Due  . DEXA SCAN  02/09/2017 (Originally 11/30/1986)  . PNA vac Low Risk Adult (2 of 2 - PCV13) 06/10/2017  . INFLUENZA VACCINE  Completed   No flowsheet data found. Functional Status Survey:  She is afebrile pulse of 80 respirations of 16  Physical Exam   In general this is a frail elderly female she is alert and sitting comfortably in her wheelchair this evening  Her skin is warm and dry.  -  Eyes pupils appear to be reactive to light visual acuity appears grossly intact.     Oropharynx mucous membranes appear f moist   Her heart is regular rate and rhythm with a slight  murmur she has mild lower extremity edema   Her chest is clear to auscultation --there is no labored breathing there is shallow air entry.  Her abdomen is soft does not appear to be tender --bowel sounds are positive.  Musculoskeletal does has general weakness lower extremities which is baseline for strength appears to be intact--she does not appear to have discomfort with gentle range of motion of her extremities  Neurologic could not really appreciate lateralizing findings or changes from baseline cranial nerves are grossly intact.  Psych she does have significant dementia she is pleasant smiling and does speak when addressed --gave me a big smile before I left   Labs reviewed:  10/05/2016.  WBC 5.1 hemoglobin 9.7 platelets 291.   TSH general 18 2018 was 2.4  Jan  third 2018-sodium was 142 potassium 4 BUN 27 creatinine 0.81.  ALT 12 albumin was 2.8 total bilirubin was 0.2 otherwise liver function tests within normal limits   Recent Labs (within last 365 days)   Recent Labs  07/12/16 0700 08/05/16 0700 09/01/16 1700  NA 138 136 142  K 3.6 4.0 4.0  CL 103 102 109  CO2 27 25 26   GLUCOSE 92 100* 110*  BUN 28* 18 27*   CREATININE 0.75 0.72 0.81  CALCIUM 8.8* 9.2 8.9      Recent Labs (within last 365 days)   Recent Labs  03/31/16 0655 04/13/16 0720 06/10/16 0500 09/01/16 1700  AST 14*  --  17 18  ALT 9*  --  11* 12*  ALKPHOS 40  --  41 47  BILITOT 0.3  --  0.4 0.2*  PROT 6.4*  --  7.1 7.2  ALBUMIN 2.1* 2.2* 2.9* 2.8*      Recent Labs (within last 365 days)   Recent Labs  07/12/16 0700 08/05/16 0700 09/01/16 1700 09/22/16 0700  WBC 5.9 5.6 5.3 4.9  NEUTROABS 3.3  --  2.6 3.1  HGB 10.0* 10.5* 9.7* 9.8*  HCT  31.2* 32.9* 30.0* 29.2*  MCV 89.7 89.6 90.4 86.9  PLT 326 341 294 263      Assessment and plan.  #1 dementia with now some anxiety agitation-will obtain a urinalysis and culture to rule out urinary tract infection she does not appear to be overtly symptomatic but would like to get an updated urine.  Also will start low-dose Ativan 0.25 mg twice a day when necessary for a short course of 14 days and monitor if this is effective-I did speak with her daughter that some people did not do well with Ativan and actually aggravates the situation she expressed understanding but she would like to give it a try.  Also will update a CBC and metabolic panel for updated values as well as a TSH.  She does have a history of hypothyroidism and is on supplementation. Last TSH in January was unremarkable.  [id number]

## 2016-10-27 DIAGNOSIS — F0390 Unspecified dementia without behavioral disturbance: Secondary | ICD-10-CM | POA: Diagnosis not present

## 2016-10-27 DIAGNOSIS — M1990 Unspecified osteoarthritis, unspecified site: Secondary | ICD-10-CM | POA: Diagnosis not present

## 2016-10-27 DIAGNOSIS — R279 Unspecified lack of coordination: Secondary | ICD-10-CM | POA: Diagnosis not present

## 2016-10-27 DIAGNOSIS — M6281 Muscle weakness (generalized): Secondary | ICD-10-CM | POA: Diagnosis not present

## 2016-10-28 DIAGNOSIS — M6281 Muscle weakness (generalized): Secondary | ICD-10-CM | POA: Diagnosis not present

## 2016-10-28 DIAGNOSIS — M1990 Unspecified osteoarthritis, unspecified site: Secondary | ICD-10-CM | POA: Diagnosis not present

## 2016-10-28 DIAGNOSIS — F0390 Unspecified dementia without behavioral disturbance: Secondary | ICD-10-CM | POA: Diagnosis not present

## 2016-10-28 DIAGNOSIS — R279 Unspecified lack of coordination: Secondary | ICD-10-CM | POA: Diagnosis not present

## 2016-10-29 ENCOUNTER — Encounter (HOSPITAL_COMMUNITY)
Admission: AD | Admit: 2016-10-29 | Discharge: 2016-10-29 | Disposition: A | Discharge status: Home / Self Care | Payer: Medicare Other | Source: Skilled Nursing Facility | Attending: Internal Medicine | Admitting: Internal Medicine

## 2016-10-29 DIAGNOSIS — E039 Hypothyroidism, unspecified: Secondary | ICD-10-CM | POA: Insufficient documentation

## 2016-10-29 DIAGNOSIS — Z48815 Encounter for surgical aftercare following surgery on the digestive system: Secondary | ICD-10-CM | POA: Insufficient documentation

## 2016-10-29 DIAGNOSIS — N390 Urinary tract infection, site not specified: Secondary | ICD-10-CM | POA: Diagnosis not present

## 2016-10-29 DIAGNOSIS — R262 Difficulty in walking, not elsewhere classified: Secondary | ICD-10-CM | POA: Insufficient documentation

## 2016-10-29 DIAGNOSIS — R17 Unspecified jaundice: Secondary | ICD-10-CM | POA: Insufficient documentation

## 2016-10-29 DIAGNOSIS — E785 Hyperlipidemia, unspecified: Secondary | ICD-10-CM | POA: Insufficient documentation

## 2016-10-29 DIAGNOSIS — M1990 Unspecified osteoarthritis, unspecified site: Secondary | ICD-10-CM | POA: Diagnosis not present

## 2016-10-29 DIAGNOSIS — R279 Unspecified lack of coordination: Secondary | ICD-10-CM | POA: Diagnosis not present

## 2016-10-29 DIAGNOSIS — D509 Iron deficiency anemia, unspecified: Secondary | ICD-10-CM | POA: Diagnosis not present

## 2016-10-29 DIAGNOSIS — M6281 Muscle weakness (generalized): Secondary | ICD-10-CM | POA: Diagnosis not present

## 2016-10-29 DIAGNOSIS — I10 Essential (primary) hypertension: Secondary | ICD-10-CM | POA: Diagnosis not present

## 2016-10-29 DIAGNOSIS — F0390 Unspecified dementia without behavioral disturbance: Secondary | ICD-10-CM | POA: Diagnosis not present

## 2016-10-29 LAB — CBC WITH DIFFERENTIAL/PLATELET
BASOS PCT: 1 %
Basophils Absolute: 0 10*3/uL (ref 0.0–0.1)
EOS ABS: 0.1 10*3/uL (ref 0.0–0.7)
Eosinophils Relative: 2 %
HCT: 33.4 % — ABNORMAL LOW (ref 36.0–46.0)
HEMOGLOBIN: 10.8 g/dL — AB (ref 12.0–15.0)
Lymphocytes Relative: 35 %
Lymphs Abs: 1.5 10*3/uL (ref 0.7–4.0)
MCH: 28.6 pg (ref 26.0–34.0)
MCHC: 32.3 g/dL (ref 30.0–36.0)
MCV: 88.4 fL (ref 78.0–100.0)
MONOS PCT: 12 %
Monocytes Absolute: 0.5 10*3/uL (ref 0.1–1.0)
NEUTROS PCT: 50 %
Neutro Abs: 2.2 10*3/uL (ref 1.7–7.7)
Platelets: 284 10*3/uL (ref 150–400)
RBC: 3.78 MIL/uL — ABNORMAL LOW (ref 3.87–5.11)
RDW: 14.9 % (ref 11.5–15.5)
WBC: 4.4 10*3/uL (ref 4.0–10.5)

## 2016-10-29 LAB — TSH: TSH: 1.813 u[IU]/mL (ref 0.350–4.500)

## 2016-10-29 LAB — BASIC METABOLIC PANEL
Anion gap: 7 (ref 5–15)
BUN: 17 mg/dL (ref 6–20)
CO2: 27 mmol/L (ref 22–32)
CREATININE: 0.61 mg/dL (ref 0.44–1.00)
Calcium: 8.8 mg/dL — ABNORMAL LOW (ref 8.9–10.3)
Chloride: 101 mmol/L (ref 101–111)
GFR calc Af Amer: >60 mL/min (ref >60)
Glucose, Bld: 87 mg/dL (ref 65–99)
Potassium: 4.2 mmol/L (ref 3.5–5.1)
SODIUM: 135 mmol/L (ref 135–145)

## 2016-10-30 ENCOUNTER — Encounter (HOSPITAL_COMMUNITY)
Admission: RE | Admit: 2016-10-30 | Discharge: 2016-10-30 | Disposition: A | Discharge status: Home / Self Care | Payer: Medicare Other | Source: Skilled Nursing Facility | Attending: Internal Medicine | Admitting: Internal Medicine

## 2016-10-30 DIAGNOSIS — R8290 Unspecified abnormal findings in urine: Secondary | ICD-10-CM | POA: Diagnosis not present

## 2016-10-30 DIAGNOSIS — M6281 Muscle weakness (generalized): Secondary | ICD-10-CM | POA: Insufficient documentation

## 2016-10-30 LAB — URINALYSIS, ROUTINE W REFLEX MICROSCOPIC
BILIRUBIN URINE: NEGATIVE
Glucose, UA: NEGATIVE mg/dL
Ketones, ur: NEGATIVE mg/dL
Leukocytes, UA: NEGATIVE
Nitrite: NEGATIVE
PH: 6 (ref 5.0–8.0)
Protein, ur: NEGATIVE mg/dL
SPECIFIC GRAVITY, URINE: 1.011 (ref 1.005–1.030)

## 2016-11-01 DIAGNOSIS — M1990 Unspecified osteoarthritis, unspecified site: Secondary | ICD-10-CM | POA: Diagnosis not present

## 2016-11-01 DIAGNOSIS — R279 Unspecified lack of coordination: Secondary | ICD-10-CM | POA: Diagnosis not present

## 2016-11-01 DIAGNOSIS — M6281 Muscle weakness (generalized): Secondary | ICD-10-CM | POA: Diagnosis not present

## 2016-11-01 DIAGNOSIS — F0390 Unspecified dementia without behavioral disturbance: Secondary | ICD-10-CM | POA: Diagnosis not present

## 2016-11-01 LAB — URINE CULTURE

## 2016-11-02 DIAGNOSIS — M6281 Muscle weakness (generalized): Secondary | ICD-10-CM | POA: Diagnosis not present

## 2016-11-02 DIAGNOSIS — R279 Unspecified lack of coordination: Secondary | ICD-10-CM | POA: Diagnosis not present

## 2016-11-02 DIAGNOSIS — M1990 Unspecified osteoarthritis, unspecified site: Secondary | ICD-10-CM | POA: Diagnosis not present

## 2016-11-02 DIAGNOSIS — F0390 Unspecified dementia without behavioral disturbance: Secondary | ICD-10-CM | POA: Diagnosis not present

## 2016-11-03 DIAGNOSIS — F0390 Unspecified dementia without behavioral disturbance: Secondary | ICD-10-CM | POA: Diagnosis not present

## 2016-11-03 DIAGNOSIS — M1990 Unspecified osteoarthritis, unspecified site: Secondary | ICD-10-CM | POA: Diagnosis not present

## 2016-11-03 DIAGNOSIS — M6281 Muscle weakness (generalized): Secondary | ICD-10-CM | POA: Diagnosis not present

## 2016-11-03 DIAGNOSIS — R279 Unspecified lack of coordination: Secondary | ICD-10-CM | POA: Diagnosis not present

## 2016-11-04 DIAGNOSIS — M1990 Unspecified osteoarthritis, unspecified site: Secondary | ICD-10-CM | POA: Diagnosis not present

## 2016-11-04 DIAGNOSIS — R279 Unspecified lack of coordination: Secondary | ICD-10-CM | POA: Diagnosis not present

## 2016-11-04 DIAGNOSIS — F0390 Unspecified dementia without behavioral disturbance: Secondary | ICD-10-CM | POA: Diagnosis not present

## 2016-11-04 DIAGNOSIS — M6281 Muscle weakness (generalized): Secondary | ICD-10-CM | POA: Diagnosis not present

## 2016-11-05 ENCOUNTER — Other Ambulatory Visit (HOSPITAL_COMMUNITY)
Admission: AD | Admit: 2016-11-05 | Discharge: 2016-11-05 | Disposition: A | Discharge status: Home / Self Care | Payer: Medicare Other | Source: Skilled Nursing Facility | Attending: Internal Medicine | Admitting: Internal Medicine

## 2016-11-05 DIAGNOSIS — F0390 Unspecified dementia without behavioral disturbance: Secondary | ICD-10-CM | POA: Diagnosis not present

## 2016-11-05 DIAGNOSIS — M6281 Muscle weakness (generalized): Secondary | ICD-10-CM | POA: Diagnosis not present

## 2016-11-05 DIAGNOSIS — M1990 Unspecified osteoarthritis, unspecified site: Secondary | ICD-10-CM | POA: Diagnosis not present

## 2016-11-05 DIAGNOSIS — R279 Unspecified lack of coordination: Secondary | ICD-10-CM | POA: Diagnosis not present

## 2016-11-05 DIAGNOSIS — N390 Urinary tract infection, site not specified: Secondary | ICD-10-CM | POA: Diagnosis not present

## 2016-11-05 LAB — URINALYSIS, ROUTINE W REFLEX MICROSCOPIC
BILIRUBIN URINE: NEGATIVE
Glucose, UA: NEGATIVE mg/dL
Ketones, ur: NEGATIVE mg/dL
Nitrite: NEGATIVE
PH: 5 (ref 5.0–8.0)
Protein, ur: NEGATIVE mg/dL
SPECIFIC GRAVITY, URINE: 1.015 (ref 1.005–1.030)

## 2016-11-07 LAB — URINE CULTURE: Culture: <10000 — AB

## 2016-11-08 DIAGNOSIS — R279 Unspecified lack of coordination: Secondary | ICD-10-CM | POA: Diagnosis not present

## 2016-11-08 DIAGNOSIS — M6281 Muscle weakness (generalized): Secondary | ICD-10-CM | POA: Diagnosis not present

## 2016-11-08 DIAGNOSIS — M1990 Unspecified osteoarthritis, unspecified site: Secondary | ICD-10-CM | POA: Diagnosis not present

## 2016-11-08 DIAGNOSIS — F0390 Unspecified dementia without behavioral disturbance: Secondary | ICD-10-CM | POA: Diagnosis not present

## 2016-11-09 DIAGNOSIS — R279 Unspecified lack of coordination: Secondary | ICD-10-CM | POA: Diagnosis not present

## 2016-11-09 DIAGNOSIS — F0390 Unspecified dementia without behavioral disturbance: Secondary | ICD-10-CM | POA: Diagnosis not present

## 2016-11-09 DIAGNOSIS — M1990 Unspecified osteoarthritis, unspecified site: Secondary | ICD-10-CM | POA: Diagnosis not present

## 2016-11-09 DIAGNOSIS — M6281 Muscle weakness (generalized): Secondary | ICD-10-CM | POA: Diagnosis not present

## 2016-11-10 DIAGNOSIS — M6281 Muscle weakness (generalized): Secondary | ICD-10-CM | POA: Diagnosis not present

## 2016-11-10 DIAGNOSIS — F0390 Unspecified dementia without behavioral disturbance: Secondary | ICD-10-CM | POA: Diagnosis not present

## 2016-11-10 DIAGNOSIS — M1990 Unspecified osteoarthritis, unspecified site: Secondary | ICD-10-CM | POA: Diagnosis not present

## 2016-11-10 DIAGNOSIS — R279 Unspecified lack of coordination: Secondary | ICD-10-CM | POA: Diagnosis not present

## 2016-11-11 DIAGNOSIS — M1990 Unspecified osteoarthritis, unspecified site: Secondary | ICD-10-CM | POA: Diagnosis not present

## 2016-11-11 DIAGNOSIS — M6281 Muscle weakness (generalized): Secondary | ICD-10-CM | POA: Diagnosis not present

## 2016-11-11 DIAGNOSIS — R279 Unspecified lack of coordination: Secondary | ICD-10-CM | POA: Diagnosis not present

## 2016-11-11 DIAGNOSIS — F0390 Unspecified dementia without behavioral disturbance: Secondary | ICD-10-CM | POA: Diagnosis not present

## 2016-11-12 DIAGNOSIS — M1990 Unspecified osteoarthritis, unspecified site: Secondary | ICD-10-CM | POA: Diagnosis not present

## 2016-11-12 DIAGNOSIS — M6281 Muscle weakness (generalized): Secondary | ICD-10-CM | POA: Diagnosis not present

## 2016-11-12 DIAGNOSIS — R279 Unspecified lack of coordination: Secondary | ICD-10-CM | POA: Diagnosis not present

## 2016-11-12 DIAGNOSIS — F0390 Unspecified dementia without behavioral disturbance: Secondary | ICD-10-CM | POA: Diagnosis not present

## 2016-11-15 ENCOUNTER — Encounter: Payer: Self-pay | Admitting: Internal Medicine

## 2016-11-15 ENCOUNTER — Non-Acute Institutional Stay (SKILLED_NURSING_FACILITY): Payer: Medicare Other | Admitting: Internal Medicine

## 2016-11-15 DIAGNOSIS — M25511 Pain in right shoulder: Secondary | ICD-10-CM | POA: Diagnosis not present

## 2016-11-15 DIAGNOSIS — D509 Iron deficiency anemia, unspecified: Secondary | ICD-10-CM | POA: Diagnosis not present

## 2016-11-15 DIAGNOSIS — E039 Hypothyroidism, unspecified: Secondary | ICD-10-CM

## 2016-11-15 DIAGNOSIS — M79621 Pain in right upper arm: Secondary | ICD-10-CM | POA: Diagnosis not present

## 2016-11-15 DIAGNOSIS — R279 Unspecified lack of coordination: Secondary | ICD-10-CM | POA: Diagnosis not present

## 2016-11-15 DIAGNOSIS — F0390 Unspecified dementia without behavioral disturbance: Secondary | ICD-10-CM | POA: Diagnosis not present

## 2016-11-15 DIAGNOSIS — M79601 Pain in right arm: Secondary | ICD-10-CM | POA: Diagnosis not present

## 2016-11-15 DIAGNOSIS — M1990 Unspecified osteoarthritis, unspecified site: Secondary | ICD-10-CM | POA: Diagnosis not present

## 2016-11-15 DIAGNOSIS — M79641 Pain in right hand: Secondary | ICD-10-CM | POA: Diagnosis not present

## 2016-11-15 DIAGNOSIS — M6281 Muscle weakness (generalized): Secondary | ICD-10-CM | POA: Diagnosis not present

## 2016-11-15 NOTE — Progress Notes (Signed)
Location:   [place] Room Number: 157/W Place of Service:  SNF [phone]) Provider:  [name], MD  Patient Care Team: [name], MD as PCP - General (Internal Medicine)  Extended Emergency Contact Information Primary Emergency Contact: [name] Address: [address]          [place],  [place] Home Phone: [phone] Relation: None Secondary Emergency Contact: [place], [place] [address] [name] of [place] Phone: [phone] Relation: Grandson  Code Status:  DNR Goals of care: Advanced Directive information Advanced Directives 11/15/2016  Does Patient Have a Medical Advance Directive? Yes  Type of Advance Directive Out of facility DNR (pink MOST or yellow form)  Does patient want to make changes to medical advance directive? No - Patient declined  Copy of [place] in Chart? -  Would patient like information on creating a medical advance directive? -  Pre-existing out of facility DNR order (yellow form or pink MOST form) -    Chief complaint-acute visit secondary to right arm pain HPI:  Pt is a 83 y.o. female seen today for an acute visit for family concerns that patient has increased right arm pain.  Family states that patient appears to be having some increased right arm discomfort when it is raised this appears to be more in the shoulder area possibly.  Patient has significant dementia and cannot really give a detailed review of systems-however 1-she says her arm does hurt sometimes.  There's been no recent history of fall or trauma to my knowledge   Past Medical History:  Diagnosis Date  . Anemia   . Arthritis   . Bradycardia   . Cancer [facility])    had breast cancer 8 yrs ago  . 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 GERD (gastroesophageal reflux disease)   . HCAP (healthcare-associated pneumonia)   . Hypertension   . Hypokalemia   .  Hypomagnesemia   . Osteoarthritis   . Pancreatitis, acute 05/03/2015  . Thyroid disease   . Urinary tract infection 02/07/16   Coag negative staph. Nitrofurantoin prescribed   . UTI (lower urinary tract infection) 04/03/2016   Proteus treated with Septra DS   Past Surgical History:  Procedure Laterality Date  . BREAST SURGERY    . ERCP N/A 01/22/2016   Procedure: ENDOSCOPIC RETROGRADE CHOLANGIOPANCREATOGRAPHY (ERCP) With biliary sphincterotomy and stone extraction ;  Surgeon: Rogene Houston, MD;  Location: [facility];  Service: Endoscopy;  Laterality: N/A;  . ESOPHAGOGASTRODUODENOSCOPY N/A 05/07/2015   Procedure: ESOPHAGOGASTRODUODENOSCOPY (EGD);  Surgeon: Rogene Houston, MD;  Location: [facility];  Service: Endoscopy;  Laterality: N/A;    Allergies  Allergen Reactions  . Codeine Nausea And Vomiting  . Nsaids     Current Outpatient Prescriptions on File Prior to Visit  Medication Sig Dispense Refill  . acetaminophen (TYLENOL) 325 MG tablet Take 650 mg by mouth every 6 (six) hours as needed.    . Amino Acids-Protein Hydrolys (FEEDING SUPPLEMENT, PRO-STAT SUGAR FREE 64,) LIQD ProStat 30 ml TID mix with 4 oz water or juice three times a day between meals    . Balsam [place]-Castor Oil (VENELEX) OINT Apply to sacrum and bilateral buttock every shift and as needed for prevention    . Cholecalciferol 1000 units TBDP Take 1 tablet by mouth daily    . docusate sodium (COLACE) 100 MG capsule Take  100 mg by mouth every morning.    . ferrous sulfate (FERROUSUL) 325 (65 FE) MG tablet Take 325 mg by mouth daily with breakfast.    . levothyroxine (SYNTHROID, LEVOTHROID) 175 MCG tablet Take 175 mcg by mouth daily before breakfast.    . losartan (COZAAR) 50 MG tablet Take 50 mg by mouth daily.    . Melatonin 3 MG TABS Take 3 mg by mouth at bedtime as needed.    . Multiple Vitamins-Minerals (MULTIVITAMIN WITH MINERALS) tablet Take 1 tablet by mouth daily.    Marland Kitchen omeprazole (PRILOSEC) 20 MG capsule Take 20  mg by mouth daily.    . polyvinyl alcohol (LIQUIFILM TEARS) 1.4 % ophthalmic solution Place 1 drop into both eyes every 4 (four) hours.     . simvastatin (ZOCOR) 10 MG tablet Take 10 mg by mouth at bedtime.    . traMADol (ULTRAM) 50 MG tablet Take 100 mg by mouth at bedtime.     No current facility-administered medications on file prior to visit.      Review of Systems   This is limited secondary to dementia please see history of present illness again only complaint appears to be right arm discomfort  Immunization History  Administered Date(s) Administered  . Influenza,inj,Quad PF,36+ Mos 05/04/2015  . Influenza-Unspecified 05/28/2016  . Pneumococcal-Unspecified 06/10/2016   Pertinent  Health Maintenance Due  Topic Date Due  . DEXA SCAN  02/09/2017 (Originally 11/30/1986)  . PNA vac Low Risk Adult (2 of 2 - PCV13) 06/10/2017  . INFLUENZA VACCINE  Completed   No flowsheet data found. Functional Status Survey:    She is afebrile pulse 90 respirations 18 blood pressure 123/66  Physical Exam   In general this is a frail elderly female she is alert and sitting comfortably in her wheelchair this evening  Her skin is warm and dry. -      Oropharynx mucous membranes appearmoist   Her heart is regular rate and rhythm with a slight murmur she has mild lower extremity edema which appears relatively baseline  Her chest is clear to auscultation --there is no labored breathing there is shallow air entry.  Her abdomen is soft does not appear to be tender --bowel sounds are positive.  Musculoskeletal does has general weakness lower extremities which is baseline Grip strength appears to be intact bilaterally-she does have some stiffness of her right arm compared to her left arm-also there is grimacing with passive range of motion when you try to lift her right arm-there is also some stiffness with flexion and extension at the elbow I do not note any deformity of her  shoulder area or arm or wrist-other than arthritic-her radial pulse is intact  I do not note any erythema or significantly increased warmth of her right arm it is is slightly warmer than her left but she's had it on a pillow cushion which I suspect contributing to the mild warmth  Neurologic could not really appreciate lateralizing findings or changes from baseline cranial nerves are grossly intact.  Psych she does have significant dementia she is pleasant smiling and does speak when addressed --gave me a big smile before I left  Labs reviewed:  Recent Labs  08/05/16 0700 09/01/16 1700 10/29/16 0700  NA 136 142 135  K 4.0 4.0 4.2  CL 102 109 101  CO2 25 26 27   GLUCOSE 100* 110* 87  BUN 18 27* 17  CREATININE 0.72 0.81 0.61  CALCIUM 9.2 8.9 8.8*    Recent  Labs  03/31/16 0655 04/13/16 0720 06/10/16 0500 09/01/16 1700  AST 14*  --  17 18  ALT 9*  --  11* 12*  ALKPHOS 40  --  41 47  BILITOT 0.3  --  0.4 0.2*  PROT 6.4*  --  7.1 7.2  ALBUMIN 2.1* 2.2* 2.9* 2.8*    Recent Labs  09/22/16 0700 09/28/16 0700 10/05/16 0930 10/29/16 0700  WBC 4.9 4.3 5.1 4.4  NEUTROABS 3.1 1.6*  --  2.2  HGB 9.8* 9.3* 9.7* 10.8*  HCT 29.2* 28.4* 29.4* 33.4*  MCV 86.9 88.8 87.5 88.4  PLT 263 291 291 284   Lab Results  Component Value Date   TSH 1.813 10/29/2016   No results found for: HGBA1C Lab Results  Component Value Date   CHOL 114 06/10/2016   HDL 60 06/10/2016   LDLCALC 42 06/10/2016   TRIG 60 06/10/2016   CHOLHDL 1.9 06/10/2016    Significant Diagnostic Results in last 30 days:  No results found.  Assessment/Plan  #1-right arm discomfort-she does have some stiffness on her right compared to left although I'm not sure this is totally new-family has noted apparently some increased grimacing when her right arm is lifted-will obtain an x-ray of her shoulder and arm as well as hand to assess-again with patient's dementia difficult to truly localize this.  She does  receive tramadol at night for pain she is also has a when necessary Tylenol order every 6 hours-at this point will monitor and await the x-ray results.  #2 anemia-suspect there is an element of chronic disease as well as iron deficiency here hemoglobin actually showed improvement on lab done earlier this month at 10.8 it had been 9.7 previously.-She continues on iron - #3 hypothyroidism this appears stable on lab done last month at 1.813 she is on Synthroid.  [id number]

## 2016-11-16 DIAGNOSIS — M1990 Unspecified osteoarthritis, unspecified site: Secondary | ICD-10-CM | POA: Diagnosis not present

## 2016-11-16 DIAGNOSIS — F0390 Unspecified dementia without behavioral disturbance: Secondary | ICD-10-CM | POA: Diagnosis not present

## 2016-11-16 DIAGNOSIS — R279 Unspecified lack of coordination: Secondary | ICD-10-CM | POA: Diagnosis not present

## 2016-11-16 DIAGNOSIS — M6281 Muscle weakness (generalized): Secondary | ICD-10-CM | POA: Diagnosis not present

## 2016-11-17 DIAGNOSIS — M6281 Muscle weakness (generalized): Secondary | ICD-10-CM | POA: Diagnosis not present

## 2016-11-17 DIAGNOSIS — F0390 Unspecified dementia without behavioral disturbance: Secondary | ICD-10-CM | POA: Diagnosis not present

## 2016-11-17 DIAGNOSIS — M1990 Unspecified osteoarthritis, unspecified site: Secondary | ICD-10-CM | POA: Diagnosis not present

## 2016-11-17 DIAGNOSIS — R279 Unspecified lack of coordination: Secondary | ICD-10-CM | POA: Diagnosis not present

## 2016-11-18 ENCOUNTER — Encounter: Payer: Self-pay | Admitting: Internal Medicine

## 2016-11-18 ENCOUNTER — Non-Acute Institutional Stay (SKILLED_NURSING_FACILITY): Payer: Medicare Other | Admitting: Internal Medicine

## 2016-11-18 DIAGNOSIS — E039 Hypothyroidism, unspecified: Secondary | ICD-10-CM

## 2016-11-18 DIAGNOSIS — M1990 Unspecified osteoarthritis, unspecified site: Secondary | ICD-10-CM | POA: Diagnosis not present

## 2016-11-18 DIAGNOSIS — M6281 Muscle weakness (generalized): Secondary | ICD-10-CM | POA: Diagnosis not present

## 2016-11-18 DIAGNOSIS — I10 Essential (primary) hypertension: Secondary | ICD-10-CM | POA: Diagnosis not present

## 2016-11-18 DIAGNOSIS — M25511 Pain in right shoulder: Secondary | ICD-10-CM | POA: Diagnosis not present

## 2016-11-18 DIAGNOSIS — R279 Unspecified lack of coordination: Secondary | ICD-10-CM | POA: Diagnosis not present

## 2016-11-18 DIAGNOSIS — G8929 Other chronic pain: Secondary | ICD-10-CM

## 2016-11-18 DIAGNOSIS — D509 Iron deficiency anemia, unspecified: Secondary | ICD-10-CM | POA: Diagnosis not present

## 2016-11-18 DIAGNOSIS — F0390 Unspecified dementia without behavioral disturbance: Secondary | ICD-10-CM | POA: Diagnosis not present

## 2016-11-18 NOTE — Progress Notes (Signed)
Location:   [facility] Room Number: 157/W Place of Service:  SNF (31) Provider:  Geralyn Flash, MD  Patient Care Team: [name], MD as PCP - General (Internal Medicine)  Extended Emergency Contact Information Primary Emergency Contact: [name] Address: [address]          [place],  [place] Home Phone: [phone] Relation: None Secondary Emergency Contact: [place], [place] [address] [place] Phone: [phone] Relation: Grandson  Code Status:  DNR Goals of care: Advanced Directive information Advanced Directives 11/18/2016  Does Patient Have a Medical Advance Directive? Yes  Type of Advance Directive Out of facility DNR (pink MOST or yellow form)  Does patient want to make changes to medical advance directive? No - Patient declined  Copy of [place] in Chart? -  Would patient like information on creating a medical advance directive? -  Pre-existing out of facility DNR order (yellow form or pink MOST form) -     Chief Complaint  Patient presents with  . Medical Management of Chronic Issues    Routine Visit    HPI:  Pt is a 83 y.o. female seen today for medical management of chronic diseases.    Patient has h/o Dementia, hypothyroidism, Hypertension, And anemia.  Patient is long term resident of facility. She has been stable in the facility. Her family had noticed that she was having some pain when they would move her right Arm. Arlo saw her and got Xray of the shoulder and arm Which showed osteoarthritis and osteoporosis but no Acute injury. Patient is sitting in her wheel chair today. Is smiling and seems in no acute distress. Her weight is stable at 119 lbs. Per nurses her apetite is good.   Past Medical History:  Diagnosis Date  . Anemia   . Arthritis   . Bradycardia   . Cancer [facility])    had breast cancer 8 yrs ago  . GAVE (gastric antral vascular  ectasia) 05/03/15   per EGD  . GERD (gastroesophageal reflux disease)   . HCAP (healthcare-associated pneumonia)   . Hypertension   . Hypokalemia   . Hypomagnesemia   . Osteoarthritis   . Pancreatitis, acute 05/03/2015  . Thyroid disease   . Urinary tract infection 02/07/16   Coag negative staph. Nitrofurantoin prescribed   . UTI (lower urinary tract infection) 04/03/2016   Proteus treated with Septra DS   Past Surgical History:  Procedure Laterality Date  . BREAST SURGERY    . ERCP N/A 01/22/2016   Procedure: ENDOSCOPIC RETROGRADE CHOLANGIOPANCREATOGRAPHY (ERCP) With biliary sphincterotomy and stone extraction ;  Surgeon: Rogene Houston, MD;  Location: [facility] ORS;  Service: Endoscopy;  Laterality: N/A;  . ESOPHAGOGASTRODUODENOSCOPY N/A 05/07/2015   Procedure: ESOPHAGOGASTRODUODENOSCOPY (EGD);  Surgeon: Rogene Houston, MD;  Location: [facility] ENDO SUITE;  Service: Endoscopy;  Laterality: N/A;    Allergies  Allergen Reactions  . Codeine Nausea And Vomiting  . Nsaids     Current Outpatient Prescriptions on File Prior to Visit  Medication Sig Dispense Refill  . acetaminophen (TYLENOL) 325 MG tablet Take 650 mg by mouth every 6 (six) hours as needed.    . Amino Acids-Protein Hydrolys (FEEDING SUPPLEMENT, PRO-STAT SUGAR FREE 64,) LIQD ProStat 30 ml TID mix with 4 oz water or juice three times a day between meals    . Balsam [place]-Castor Oil (VENELEX) OINT Apply to sacrum  and bilateral buttock every shift and as needed for prevention    . Cholecalciferol 1000 units TBDP Take 1 tablet by mouth daily    . docusate sodium (COLACE) 100 MG capsule Take 100 mg by mouth every morning.    . ferrous sulfate (FERROUSUL) 325 (65 FE) MG tablet Take 325 mg by mouth daily with breakfast.    . levothyroxine (SYNTHROID, LEVOTHROID) 175 MCG tablet Take 175 mcg by mouth daily before breakfast.    . losartan (COZAAR) 50 MG tablet Take 50 mg by mouth daily.    . Multiple Vitamins-Minerals (MULTIVITAMIN WITH MINERALS)  tablet Take 1 tablet by mouth daily.    Mar[place] Kitchen omeprazole (PRILOSEC) 20 MG capsule Take 20 mg by mouth daily.    . polyvinyl alcohol (LIQUIFILM TEARS) 1.4 % ophthalmic solution Place 1 drop into both eyes every 4 (four) hours.     . simvastatin (ZOCOR) 10 MG tablet Take 10 mg by mouth at bedtime.    . traMADol (ULTRAM) 50 MG tablet Take 100 mg by mouth at bedtime.     No current facility-administered medications on file prior to visit.      Review of Systems  Unable to perform ROS: Dementia    Immunization History  Administered Date(s) Administered  . Influenza,inj,Quad PF,36+ Mos 05/04/2015  . Influenza-Unspecified 05/28/2016  . Pneumococcal-Unspecified 06/10/2016   Pertinent  Health Maintenance Due  Topic Date Due  . DEXA SCAN  02/09/2017 (Originally 11[birth year]30/1988)  . PNA vac Low Risk Adult (2 of 2 - PCV13) 06/10/2017  . INFLUENZA VACCINE  Completed   No flowsheet data found. Functional Status Survey:    Vitals:   11/19/16 0802  BP: 100/67  Pulse: 73  Resp: 16  Temp: 98.4 F (36.9 C)   There is no height or weight on file to calculate BMI. Physical Exam  Constitutional: She appears well-developed and well-nourished.  HENT:  Head: Normocephalic.  Mouth/Throat: Oropharynx is clear and moist.  Neck: Neck supple.  Cardiovascular: Normal rate, regular rhythm and normal heart sounds.   Pulmonary/Chest: Effort normal and breath sounds normal. No respiratory distress. She has no wheezes. She has no rales.  Abdominal: Soft. Bowel sounds are normal. She exhibits no distension. There is no tenderness. There is no rebound.  Musculoskeletal:  Edema in both LE Patient does have more stiffness in right UE and Lower Extremity. She also has more swelling inright side.   Neurological: She is alert.  Not oriented  Skin: Skin is warm and dry.  Psychiatric: She has a normal mood and affect.    Labs reviewed:  Recent Labs  08/05/16 0700 09/01/16 1700 10/29/16 0700  NA 136 142 135    K 4.0 4.0 4.2  CL 102 109 101  CO2 25 26 27   GLUCOSE 100* 110* 87  BUN 18 27* 17  CREATININE 0.72 0.81 0.61  CALCIUM 9.2 8.9 8.8*    Recent Labs  03/31/16 0655 04/13/16 0720 06/10/16 0500 09/01/16 1700  AST 14*  --  17 18  ALT 9*  --  11* 12*  ALKPHOS 40  --  41 47  BILITOT 0.3  --  0.4 0.2*  PROT 6.4*  --  7.1 7.2  ALBUMIN 2.1* 2.2* 2.9* 2.8*    Recent Labs  09/22/16 0700 09/28/16 0700 10/05/16 0930 10/29/16 0700  WBC 4.9 4.3 5.1 4.4  NEUTROABS 3.1 1.6*  --  2.2  HGB 9.8* 9.3* 9.7* 10.8*  HCT 29.2* 28.4* 29.4* 33.4*  MCV 86.9 88.8 87.5  88.4  PLT 263 291 291 284   Lab Results  Component Value Date   TSH 1.813 10/29/2016   No results found for: HGBA1C Lab Results  Component Value Date   CHOL 114 06/10/2016   HDL 60 06/10/2016   LDLCALC 42 06/10/2016   TRIG 60 06/10/2016   CHOLHDL 1.9 06/10/2016    Significant Diagnostic Results in last 30 days:  No results found.  Assessment/Plan Chronic right shoulder pain Most likely due to Osteoarthritis. No further work up required. Continue tramadol for pain control. Patient does have more stiffness on right extremities can be due to old Infarct.   Hypothyroidism Continue same dose of synthroid.  Essential hypertension BP stable  Iron deficiency anemia,  Hgb Stable on iron supplement. Guaiac negative in the past. On Prilosec. No more work up required. Dementia Stable and doing well.  Family/ staff Communication:   Labs/tests ordered:  All the labs reviewed and are near baseline.including Xray. No more blood work required.

## 2016-11-26 DIAGNOSIS — M1990 Unspecified osteoarthritis, unspecified site: Secondary | ICD-10-CM | POA: Diagnosis not present

## 2016-11-26 DIAGNOSIS — R279 Unspecified lack of coordination: Secondary | ICD-10-CM | POA: Diagnosis not present

## 2016-11-26 DIAGNOSIS — M6281 Muscle weakness (generalized): Secondary | ICD-10-CM | POA: Diagnosis not present

## 2016-11-26 DIAGNOSIS — F0390 Unspecified dementia without behavioral disturbance: Secondary | ICD-10-CM | POA: Diagnosis not present

## 2016-11-29 DIAGNOSIS — M6281 Muscle weakness (generalized): Secondary | ICD-10-CM | POA: Diagnosis not present

## 2016-11-29 DIAGNOSIS — F0390 Unspecified dementia without behavioral disturbance: Secondary | ICD-10-CM | POA: Diagnosis not present

## 2016-11-29 DIAGNOSIS — M25511 Pain in right shoulder: Secondary | ICD-10-CM | POA: Diagnosis not present

## 2016-11-29 DIAGNOSIS — M1990 Unspecified osteoarthritis, unspecified site: Secondary | ICD-10-CM | POA: Diagnosis not present

## 2016-11-29 DIAGNOSIS — R279 Unspecified lack of coordination: Secondary | ICD-10-CM | POA: Diagnosis not present

## 2016-11-30 DIAGNOSIS — M6281 Muscle weakness (generalized): Secondary | ICD-10-CM | POA: Diagnosis not present

## 2016-11-30 DIAGNOSIS — R279 Unspecified lack of coordination: Secondary | ICD-10-CM | POA: Diagnosis not present

## 2016-11-30 DIAGNOSIS — M25511 Pain in right shoulder: Secondary | ICD-10-CM | POA: Diagnosis not present

## 2016-11-30 DIAGNOSIS — M1990 Unspecified osteoarthritis, unspecified site: Secondary | ICD-10-CM | POA: Diagnosis not present

## 2016-11-30 DIAGNOSIS — F0390 Unspecified dementia without behavioral disturbance: Secondary | ICD-10-CM | POA: Diagnosis not present

## 2016-12-01 DIAGNOSIS — M25511 Pain in right shoulder: Secondary | ICD-10-CM | POA: Diagnosis not present

## 2016-12-01 DIAGNOSIS — R279 Unspecified lack of coordination: Secondary | ICD-10-CM | POA: Diagnosis not present

## 2016-12-01 DIAGNOSIS — M6281 Muscle weakness (generalized): Secondary | ICD-10-CM | POA: Diagnosis not present

## 2016-12-01 DIAGNOSIS — M1990 Unspecified osteoarthritis, unspecified site: Secondary | ICD-10-CM | POA: Diagnosis not present

## 2016-12-01 DIAGNOSIS — F0390 Unspecified dementia without behavioral disturbance: Secondary | ICD-10-CM | POA: Diagnosis not present

## 2016-12-02 DIAGNOSIS — F0390 Unspecified dementia without behavioral disturbance: Secondary | ICD-10-CM | POA: Diagnosis not present

## 2016-12-02 DIAGNOSIS — R279 Unspecified lack of coordination: Secondary | ICD-10-CM | POA: Diagnosis not present

## 2016-12-02 DIAGNOSIS — M1990 Unspecified osteoarthritis, unspecified site: Secondary | ICD-10-CM | POA: Diagnosis not present

## 2016-12-02 DIAGNOSIS — M6281 Muscle weakness (generalized): Secondary | ICD-10-CM | POA: Diagnosis not present

## 2016-12-02 DIAGNOSIS — M25511 Pain in right shoulder: Secondary | ICD-10-CM | POA: Diagnosis not present

## 2016-12-03 DIAGNOSIS — M6281 Muscle weakness (generalized): Secondary | ICD-10-CM | POA: Diagnosis not present

## 2016-12-03 DIAGNOSIS — M1990 Unspecified osteoarthritis, unspecified site: Secondary | ICD-10-CM | POA: Diagnosis not present

## 2016-12-03 DIAGNOSIS — F0390 Unspecified dementia without behavioral disturbance: Secondary | ICD-10-CM | POA: Diagnosis not present

## 2016-12-03 DIAGNOSIS — M25511 Pain in right shoulder: Secondary | ICD-10-CM | POA: Diagnosis not present

## 2016-12-03 DIAGNOSIS — R279 Unspecified lack of coordination: Secondary | ICD-10-CM | POA: Diagnosis not present

## 2016-12-06 DIAGNOSIS — F0390 Unspecified dementia without behavioral disturbance: Secondary | ICD-10-CM | POA: Diagnosis not present

## 2016-12-06 DIAGNOSIS — M6281 Muscle weakness (generalized): Secondary | ICD-10-CM | POA: Diagnosis not present

## 2016-12-06 DIAGNOSIS — M1990 Unspecified osteoarthritis, unspecified site: Secondary | ICD-10-CM | POA: Diagnosis not present

## 2016-12-06 DIAGNOSIS — R279 Unspecified lack of coordination: Secondary | ICD-10-CM | POA: Diagnosis not present

## 2016-12-06 DIAGNOSIS — M25511 Pain in right shoulder: Secondary | ICD-10-CM | POA: Diagnosis not present

## 2016-12-07 DIAGNOSIS — M1990 Unspecified osteoarthritis, unspecified site: Secondary | ICD-10-CM | POA: Diagnosis not present

## 2016-12-07 DIAGNOSIS — R279 Unspecified lack of coordination: Secondary | ICD-10-CM | POA: Diagnosis not present

## 2016-12-07 DIAGNOSIS — M25511 Pain in right shoulder: Secondary | ICD-10-CM | POA: Diagnosis not present

## 2016-12-07 DIAGNOSIS — M6281 Muscle weakness (generalized): Secondary | ICD-10-CM | POA: Diagnosis not present

## 2016-12-07 DIAGNOSIS — F0390 Unspecified dementia without behavioral disturbance: Secondary | ICD-10-CM | POA: Diagnosis not present

## 2016-12-08 DIAGNOSIS — M25511 Pain in right shoulder: Secondary | ICD-10-CM | POA: Diagnosis not present

## 2016-12-08 DIAGNOSIS — M6281 Muscle weakness (generalized): Secondary | ICD-10-CM | POA: Diagnosis not present

## 2016-12-08 DIAGNOSIS — R279 Unspecified lack of coordination: Secondary | ICD-10-CM | POA: Diagnosis not present

## 2016-12-08 DIAGNOSIS — M1990 Unspecified osteoarthritis, unspecified site: Secondary | ICD-10-CM | POA: Diagnosis not present

## 2016-12-08 DIAGNOSIS — F0390 Unspecified dementia without behavioral disturbance: Secondary | ICD-10-CM | POA: Diagnosis not present

## 2016-12-09 DIAGNOSIS — M25511 Pain in right shoulder: Secondary | ICD-10-CM | POA: Diagnosis not present

## 2016-12-09 DIAGNOSIS — M6281 Muscle weakness (generalized): Secondary | ICD-10-CM | POA: Diagnosis not present

## 2016-12-09 DIAGNOSIS — R279 Unspecified lack of coordination: Secondary | ICD-10-CM | POA: Diagnosis not present

## 2016-12-09 DIAGNOSIS — M1990 Unspecified osteoarthritis, unspecified site: Secondary | ICD-10-CM | POA: Diagnosis not present

## 2016-12-09 DIAGNOSIS — F0390 Unspecified dementia without behavioral disturbance: Secondary | ICD-10-CM | POA: Diagnosis not present

## 2016-12-11 DIAGNOSIS — M25511 Pain in right shoulder: Secondary | ICD-10-CM | POA: Diagnosis not present

## 2016-12-11 DIAGNOSIS — M1990 Unspecified osteoarthritis, unspecified site: Secondary | ICD-10-CM | POA: Diagnosis not present

## 2016-12-11 DIAGNOSIS — R279 Unspecified lack of coordination: Secondary | ICD-10-CM | POA: Diagnosis not present

## 2016-12-11 DIAGNOSIS — F0390 Unspecified dementia without behavioral disturbance: Secondary | ICD-10-CM | POA: Diagnosis not present

## 2016-12-11 DIAGNOSIS — M6281 Muscle weakness (generalized): Secondary | ICD-10-CM | POA: Diagnosis not present

## 2016-12-13 DIAGNOSIS — R279 Unspecified lack of coordination: Secondary | ICD-10-CM | POA: Diagnosis not present

## 2016-12-13 DIAGNOSIS — M25511 Pain in right shoulder: Secondary | ICD-10-CM | POA: Diagnosis not present

## 2016-12-13 DIAGNOSIS — M1990 Unspecified osteoarthritis, unspecified site: Secondary | ICD-10-CM | POA: Diagnosis not present

## 2016-12-13 DIAGNOSIS — M6281 Muscle weakness (generalized): Secondary | ICD-10-CM | POA: Diagnosis not present

## 2016-12-13 DIAGNOSIS — F0390 Unspecified dementia without behavioral disturbance: Secondary | ICD-10-CM | POA: Diagnosis not present

## 2016-12-14 DIAGNOSIS — F0390 Unspecified dementia without behavioral disturbance: Secondary | ICD-10-CM | POA: Diagnosis not present

## 2016-12-14 DIAGNOSIS — R279 Unspecified lack of coordination: Secondary | ICD-10-CM | POA: Diagnosis not present

## 2016-12-14 DIAGNOSIS — M25511 Pain in right shoulder: Secondary | ICD-10-CM | POA: Diagnosis not present

## 2016-12-14 DIAGNOSIS — M6281 Muscle weakness (generalized): Secondary | ICD-10-CM | POA: Diagnosis not present

## 2016-12-14 DIAGNOSIS — M1990 Unspecified osteoarthritis, unspecified site: Secondary | ICD-10-CM | POA: Diagnosis not present

## 2016-12-15 DIAGNOSIS — M1990 Unspecified osteoarthritis, unspecified site: Secondary | ICD-10-CM | POA: Diagnosis not present

## 2016-12-15 DIAGNOSIS — R279 Unspecified lack of coordination: Secondary | ICD-10-CM | POA: Diagnosis not present

## 2016-12-15 DIAGNOSIS — F0390 Unspecified dementia without behavioral disturbance: Secondary | ICD-10-CM | POA: Diagnosis not present

## 2016-12-15 DIAGNOSIS — M25511 Pain in right shoulder: Secondary | ICD-10-CM | POA: Diagnosis not present

## 2016-12-15 DIAGNOSIS — M6281 Muscle weakness (generalized): Secondary | ICD-10-CM | POA: Diagnosis not present

## 2016-12-16 DIAGNOSIS — M1990 Unspecified osteoarthritis, unspecified site: Secondary | ICD-10-CM | POA: Diagnosis not present

## 2016-12-16 DIAGNOSIS — M6281 Muscle weakness (generalized): Secondary | ICD-10-CM | POA: Diagnosis not present

## 2016-12-16 DIAGNOSIS — F0390 Unspecified dementia without behavioral disturbance: Secondary | ICD-10-CM | POA: Diagnosis not present

## 2016-12-16 DIAGNOSIS — M25511 Pain in right shoulder: Secondary | ICD-10-CM | POA: Diagnosis not present

## 2016-12-16 DIAGNOSIS — R279 Unspecified lack of coordination: Secondary | ICD-10-CM | POA: Diagnosis not present

## 2016-12-17 ENCOUNTER — Other Ambulatory Visit (HOSPITAL_COMMUNITY)
Admission: AD | Admit: 2016-12-17 | Discharge: 2016-12-17 | Disposition: A | Discharge status: Home / Self Care | Payer: Medicare Other | Source: Skilled Nursing Facility | Attending: Internal Medicine | Admitting: Internal Medicine

## 2016-12-17 DIAGNOSIS — Z9181 History of falling: Secondary | ICD-10-CM | POA: Insufficient documentation

## 2016-12-17 DIAGNOSIS — M6281 Muscle weakness (generalized): Secondary | ICD-10-CM | POA: Diagnosis not present

## 2016-12-17 DIAGNOSIS — M1990 Unspecified osteoarthritis, unspecified site: Secondary | ICD-10-CM | POA: Diagnosis not present

## 2016-12-17 DIAGNOSIS — F0390 Unspecified dementia without behavioral disturbance: Secondary | ICD-10-CM | POA: Diagnosis not present

## 2016-12-17 DIAGNOSIS — R279 Unspecified lack of coordination: Secondary | ICD-10-CM | POA: Diagnosis not present

## 2016-12-17 DIAGNOSIS — M25511 Pain in right shoulder: Secondary | ICD-10-CM | POA: Diagnosis not present

## 2016-12-17 LAB — URINALYSIS, ROUTINE W REFLEX MICROSCOPIC
Bilirubin Urine: NEGATIVE
Glucose, UA: NEGATIVE mg/dL
Ketones, ur: NEGATIVE mg/dL
Nitrite: POSITIVE — AB
PH: 5 (ref 5.0–8.0)
Protein, ur: NEGATIVE mg/dL
SPECIFIC GRAVITY, URINE: 1.017 (ref 1.005–1.030)

## 2016-12-19 LAB — URINE CULTURE: Culture: >=100000 — AB

## 2016-12-20 DIAGNOSIS — M1990 Unspecified osteoarthritis, unspecified site: Secondary | ICD-10-CM | POA: Diagnosis not present

## 2016-12-20 DIAGNOSIS — M25511 Pain in right shoulder: Secondary | ICD-10-CM | POA: Diagnosis not present

## 2016-12-20 DIAGNOSIS — F0390 Unspecified dementia without behavioral disturbance: Secondary | ICD-10-CM | POA: Diagnosis not present

## 2016-12-20 DIAGNOSIS — R279 Unspecified lack of coordination: Secondary | ICD-10-CM | POA: Diagnosis not present

## 2016-12-20 DIAGNOSIS — M6281 Muscle weakness (generalized): Secondary | ICD-10-CM | POA: Diagnosis not present

## 2016-12-21 DIAGNOSIS — M6281 Muscle weakness (generalized): Secondary | ICD-10-CM | POA: Diagnosis not present

## 2016-12-21 DIAGNOSIS — M1990 Unspecified osteoarthritis, unspecified site: Secondary | ICD-10-CM | POA: Diagnosis not present

## 2016-12-21 DIAGNOSIS — F0390 Unspecified dementia without behavioral disturbance: Secondary | ICD-10-CM | POA: Diagnosis not present

## 2016-12-21 DIAGNOSIS — M25511 Pain in right shoulder: Secondary | ICD-10-CM | POA: Diagnosis not present

## 2016-12-21 DIAGNOSIS — R279 Unspecified lack of coordination: Secondary | ICD-10-CM | POA: Diagnosis not present

## 2016-12-22 DIAGNOSIS — M25511 Pain in right shoulder: Secondary | ICD-10-CM | POA: Diagnosis not present

## 2016-12-22 DIAGNOSIS — R279 Unspecified lack of coordination: Secondary | ICD-10-CM | POA: Diagnosis not present

## 2016-12-22 DIAGNOSIS — M1990 Unspecified osteoarthritis, unspecified site: Secondary | ICD-10-CM | POA: Diagnosis not present

## 2016-12-22 DIAGNOSIS — M6281 Muscle weakness (generalized): Secondary | ICD-10-CM | POA: Diagnosis not present

## 2016-12-22 DIAGNOSIS — F0390 Unspecified dementia without behavioral disturbance: Secondary | ICD-10-CM | POA: Diagnosis not present

## 2016-12-23 ENCOUNTER — Encounter: Payer: Self-pay | Admitting: Internal Medicine

## 2016-12-23 ENCOUNTER — Non-Acute Institutional Stay (SKILLED_NURSING_FACILITY): Payer: Medicare Other | Admitting: Internal Medicine

## 2016-12-23 DIAGNOSIS — E559 Vitamin D deficiency, unspecified: Secondary | ICD-10-CM | POA: Diagnosis not present

## 2016-12-23 DIAGNOSIS — F0281 Dementia in other diseases classified elsewhere with behavioral disturbance: Secondary | ICD-10-CM

## 2016-12-23 DIAGNOSIS — D509 Iron deficiency anemia, unspecified: Secondary | ICD-10-CM

## 2016-12-23 DIAGNOSIS — M1990 Unspecified osteoarthritis, unspecified site: Secondary | ICD-10-CM | POA: Diagnosis not present

## 2016-12-23 DIAGNOSIS — E785 Hyperlipidemia, unspecified: Secondary | ICD-10-CM

## 2016-12-23 DIAGNOSIS — E039 Hypothyroidism, unspecified: Secondary | ICD-10-CM

## 2016-12-23 DIAGNOSIS — M6281 Muscle weakness (generalized): Secondary | ICD-10-CM | POA: Diagnosis not present

## 2016-12-23 DIAGNOSIS — G309 Alzheimer's disease, unspecified: Secondary | ICD-10-CM

## 2016-12-23 DIAGNOSIS — I10 Essential (primary) hypertension: Secondary | ICD-10-CM

## 2016-12-23 DIAGNOSIS — F0390 Unspecified dementia without behavioral disturbance: Secondary | ICD-10-CM | POA: Diagnosis not present

## 2016-12-23 DIAGNOSIS — R279 Unspecified lack of coordination: Secondary | ICD-10-CM | POA: Diagnosis not present

## 2016-12-23 DIAGNOSIS — M25511 Pain in right shoulder: Secondary | ICD-10-CM | POA: Diagnosis not present

## 2016-12-23 NOTE — Progress Notes (Signed)
Location:   [facility] Room Number: 157/W Place of Service:  SNF (31) Provider:  [name], MD  Patient Care Team: [name], MD as PCP - General (Internal Medicine)  Extended Emergency Contact Information Primary Emergency Contact: [name] Address: [address]          [place],  [place] Home Phone: [phone] Relation: None Secondary Emergency Contact: [place], [place] [address] [name] Phone: [phone] Relation: Grandson  Code Status:  DNR Goals of care: Advanced Directive information Advanced Directives 12/23/2016  Does Patient Have a Medical Advance Directive? -  Type of Advance Directive Out of facility DNR (pink MOST or yellow form)  Does patient want to make changes to medical advance directive? No - Patient declined  Copy of [place] in Chart? -  Would patient like information on creating a medical advance directive? -  Pre-existing out of facility DNR order (yellow form or pink MOST form) -     Chief Complaint  Patient presents with  . Medical Management of Chronic Issues    Routine Visit   For medical management of chronic medical issues including dementia-hypertension-thyroidism-anemia-hyperlipidemia-vitamin D deficiency-history of 3 right shoulder pain  HPI:  Pt is a 83 y.o. female seen today for medical management of chronic diseases as noted above-she continues to be quite stable.  She did recently have some increased agitation and behavior changes a urine culture came back positive and she is on Cipro which she is completing a course of on April 29.  She really came here after hospitalization for a biliary obstructions she is status post ERCP--sphinectomy--this has really been stable during her stay here liver function tests have normalized.  In regards to mention she's doing well with supportive care her weight is stable at 122 pounds  she apparently eats well and has very strong family support which helps.  She has at times complained of right shoulder pain with limited range of motion this appears to be chronic osteoarthritis she appears to be doing well with tramadol.  She also has a history of anemia she is on iron-coccult blood testing has been negative-last hemoglobin was 10.8 on lab done in early March.  She also has a history of hypertension she is on Cozaar 50 mg a day this appears to be stable most recent blood pressure 136/80 taken manually.  Currently she is sitting in her wheelchair she is visiting with family has just finished eating her supper appears to have a pretty good appetite she has no complaints today   Past Medical History:  Diagnosis Date  . Anemia   . Arthritis   . Bradycardia   . Cancer [facility])    had breast cancer 8 yrs ago  . 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 GERD (gastroesophageal reflux disease)   . HCAP (healthcare-associated pneumonia)   . Hypertension   . Hypokalemia   . Hypomagnesemia   . Osteoarthritis   . Pancreatitis, acute 05/03/2015  . Thyroid disease   . Urinary tract infection 02/07/16   Coag negative staph. Nitrofurantoin prescribed   . UTI (lower urinary tract infection) 04/03/2016   Proteus treated with Septra DS   Past Surgical History:  Procedure Laterality Date  . BREAST SURGERY    . ERCP N/A 01/22/2016   Procedure: ENDOSCOPIC RETROGRADE CHOLANGIOPANCREATOGRAPHY (ERCP) With biliary sphincterotomy and stone extraction ;  Surgeon: Rogene Houston, MD;  Location: [facility] ORS;  Service: Endoscopy;  Laterality: N/A;  . ESOPHAGOGASTRODUODENOSCOPY N/A 05/07/2015   Procedure: ESOPHAGOGASTRODUODENOSCOPY (EGD);  Surgeon: Rogene Houston, MD;  Location: [facility] ENDO SUITE;  Service: Endoscopy;  Laterality: N/A;    Allergies  Allergen Reactions  . Codeine Nausea And Vomiting  . Nsaids     Outpatient Encounter Prescriptions as of 12/23/2016  Medication Sig    . acetaminophen (TYLENOL) 325 MG tablet Take 650 mg by mouth every 6 (six) hours as needed.  . Amino Acids-Protein Hydrolys (FEEDING SUPPLEMENT, PRO-STAT SUGAR FREE 64,) LIQD ProStat 30 ml TID mix with 4 oz water or juice three times a day between meals  . Balsam [place]-Castor Oil (VENELEX) OINT Apply to sacrum and bilateral buttock every shift and as needed for prevention  . Cholecalciferol 1000 units TBDP Take 1 tablet by mouth daily  . ciprofloxacin (CIPRO) 250 MG tablet Take 250 mg by mouth 2 (two) times daily.  Marland Kitchen docusate sodium (COLACE) 100 MG capsule Take 100 mg by mouth every morning.  . ferrous sulfate (FERROUSUL) 325 (65 FE) MG tablet Take 325 mg by mouth daily with breakfast.  . levothyroxine (SYNTHROID, LEVOTHROID) 175 MCG tablet Take 175 mcg by mouth daily before breakfast.  . losartan (COZAAR) 50 MG tablet Take 50 mg by mouth daily.  . Multiple Vitamins-Minerals (MULTIVITAMIN WITH MINERALS) tablet Take 1 tablet by mouth daily.  Marland Kitchen omeprazole (PRILOSEC) 20 MG capsule Take 20 mg by mouth daily.  . polyvinyl alcohol (LIQUIFILM TEARS) 1.4 % ophthalmic solution Place 1 drop into both eyes every 4 (four) hours.   . Probiotic Product (RISA-BID PROBIOTIC PO) Give 1 table by mouth twice a day  . simvastatin (ZOCOR) 10 MG tablet Take 10 mg by mouth at bedtime.  . traMADol (ULTRAM) 50 MG tablet Take 100 mg by mouth at bedtime.   No facility-administered encounter medications on file as of 12/23/2016.      Review of Systems   This is limited secondary to dementia but she is not complaining of any pain today has had times complain of right shoulder discomfort-does not complaining of abdominal pain chest pain shortness of breath or visual changes she has prescription lenses  Immunization History  Administered Date(s) Administered  . Influenza,inj,Quad PF,36+ Mos 05/04/2015  . Influenza-Unspecified 05/28/2016  . Pneumococcal-Unspecified 06/10/2016   Pertinent  Health Maintenance Due   Topic Date Due  . DEXA SCAN  02/09/2017 (Originally 11/30/1986)  . INFLUENZA VACCINE  03/30/2017  . PNA vac Low Risk Adult (2 of 2 - PCV13) 06/10/2017   No flowsheet data found. Functional Status Survey:    Vitals:   12/12/16 1439  BP: (!) 132/96  Pulse: 64  Resp: 18  Temp: 97.9 F (36.6 C)  TempSrc: Oral  Weight: 126 lb 3.2 oz (57.2 kg)  Height: 5\' 4"  (1.626 m)  In manual blood pressure 136/80--updated weight 122.8 pounds which is relatively stable Body mass index is 21.66 kg/m. Physical Exam   Rexford Maus general this is a frail elderly female she is alert and sitting comfortably in her wheelchair   Her skin is warm and dry. -      Oropharynx mucous membranes appearmoist   Her heart is regular rate and rhythm with a slight murmur she has mild lower extremity edema which appears relatively baseline  Her chest is clear to auscultation --there is no labored breathing there is shallow air entry.-She does have poor respiratory effort  Her abdomen is soft  does not appear to be tender --bowel sounds are positive.  Musculoskeletal does has general weakness lower extremities which is baseline  Grip strength appears to be intact bilaterally-she does have some stiffness of her right arm compared to her left arm-  -there is also some stiffness with flexion and extension at the elbow I do not note any deformity of her shoulder area or arm or wrist-other than arthritic-her radial pulse is intact     Neurologic could not really appreciate lateralizing findings or changes from baseline cranial nerves are grossly intact.  Psych she does have significant dementia she is pleasant smiling and does speak when addressed -   Labs reviewed:  Recent Labs  08/05/16 0700 09/01/16 1700 10/29/16 0700  NA 136 142 135  K 4.0 4.0 4.2  CL 102 109 101  CO2 25 26 27   GLUCOSE 100* 110* 87  BUN 18 27* 17  CREATININE 0.72 0.81 0.61  CALCIUM 9.2 8.9 8.8*    Recent Labs   03/31/16 0655 04/13/16 0720 06/10/16 0500 09/01/16 1700  AST 14*  --  17 18  ALT 9*  --  11* 12*  ALKPHOS 40  --  41 47  BILITOT 0.3  --  0.4 0.2*  PROT 6.4*  --  7.1 7.2  ALBUMIN 2.1* 2.2* 2.9* 2.8*    Recent Labs  09/22/16 0700 09/28/16 0700 10/05/16 0930 10/29/16 0700  WBC 4.9 4.3 5.1 4.4  NEUTROABS 3.1 1.6*  --  2.2  HGB 9.8* 9.3* 9.7* 10.8*  HCT 29.2* 28.4* 29.4* 33.4*  MCV 86.9 88.8 87.5 88.4  PLT 263 291 291 284   Lab Results  Component Value Date   TSH 1.813 10/29/2016   No results found for: HGBA1C Lab Results  Component Value Date   CHOL 114 06/10/2016   HDL 60 06/10/2016   LDLCALC 42 06/10/2016   TRIG 60 06/10/2016   CHOLHDL 1.9 06/10/2016    Significant Diagnostic Results in last 30 days:  No results found.  Assessment/Plan  1 UTI she is finishing a course of Cipro: For Klebsiella pneumoniae--she appears relatively asymptomatic she is pleasant and cooperative today apparently behaviors have improved.  #2 anemia most likely element of chronic disease she is on iron as well hemoglobin appears relatively stable we will update a CBC last hemoglobin was 10.8 on 10/29/2016.  #3 history hypothyroidism this is stable on Synthroid TSH most recently was 1.813 back in early March as well.  #4 hypertension appears to be stable as noted above she is on losartan 50 mg a day.  #5 history of pain most likely osteoarthritic especially of the right shoulder she does receive tramadol at night and apparently this is helping. Continues with when necessary Tylenol as well  #6 history hyperlipidemia continues on Zocor will update lipid panel LDL was 42 back in lab in October.  #7 history of vitamin D deficiency she is on supplementation Will update a vitamin D level.  Number 8 history of biliary obstruction as noted above this is stable status post procedures as noted above liver function tests have normalized and remained consistently normalized will update liver  function tests.  #9 history dementia she is doing well with supportive care is not currently on any medication behaviors appear to be intermittent and usually associated with a UTI   #10 GERD this appears stable on PPI   [id number]-of note greater than 35 minutes spent assessing patient-reviewing her chart-reviewing her labs-and coordinating and formulating a plan of care  for numerous diagnoses-of note greater than 50% of time spent coordinating plan of care

## 2016-12-24 DIAGNOSIS — R279 Unspecified lack of coordination: Secondary | ICD-10-CM | POA: Diagnosis not present

## 2016-12-24 DIAGNOSIS — M6281 Muscle weakness (generalized): Secondary | ICD-10-CM | POA: Diagnosis not present

## 2016-12-24 DIAGNOSIS — M1990 Unspecified osteoarthritis, unspecified site: Secondary | ICD-10-CM | POA: Diagnosis not present

## 2016-12-24 DIAGNOSIS — F0390 Unspecified dementia without behavioral disturbance: Secondary | ICD-10-CM | POA: Diagnosis not present

## 2016-12-24 DIAGNOSIS — M25511 Pain in right shoulder: Secondary | ICD-10-CM | POA: Diagnosis not present

## 2016-12-27 ENCOUNTER — Encounter (HOSPITAL_COMMUNITY)
Admission: RE | Admit: 2016-12-27 | Discharge: 2016-12-27 | Disposition: A | Discharge status: Home / Self Care | Payer: Medicare Other | Source: Skilled Nursing Facility | Attending: Internal Medicine | Admitting: Internal Medicine

## 2016-12-27 DIAGNOSIS — D509 Iron deficiency anemia, unspecified: Secondary | ICD-10-CM | POA: Diagnosis not present

## 2016-12-27 DIAGNOSIS — R262 Difficulty in walking, not elsewhere classified: Secondary | ICD-10-CM | POA: Insufficient documentation

## 2016-12-27 DIAGNOSIS — I10 Essential (primary) hypertension: Secondary | ICD-10-CM | POA: Insufficient documentation

## 2016-12-27 DIAGNOSIS — E039 Hypothyroidism, unspecified: Secondary | ICD-10-CM | POA: Insufficient documentation

## 2016-12-27 DIAGNOSIS — M6281 Muscle weakness (generalized): Secondary | ICD-10-CM | POA: Diagnosis not present

## 2016-12-27 DIAGNOSIS — M25511 Pain in right shoulder: Secondary | ICD-10-CM | POA: Diagnosis not present

## 2016-12-27 DIAGNOSIS — R279 Unspecified lack of coordination: Secondary | ICD-10-CM | POA: Diagnosis not present

## 2016-12-27 DIAGNOSIS — Z48815 Encounter for surgical aftercare following surgery on the digestive system: Secondary | ICD-10-CM | POA: Insufficient documentation

## 2016-12-27 DIAGNOSIS — M1990 Unspecified osteoarthritis, unspecified site: Secondary | ICD-10-CM | POA: Diagnosis not present

## 2016-12-27 DIAGNOSIS — N390 Urinary tract infection, site not specified: Secondary | ICD-10-CM | POA: Diagnosis not present

## 2016-12-27 DIAGNOSIS — R17 Unspecified jaundice: Secondary | ICD-10-CM | POA: Diagnosis not present

## 2016-12-27 DIAGNOSIS — E785 Hyperlipidemia, unspecified: Secondary | ICD-10-CM | POA: Diagnosis not present

## 2016-12-27 DIAGNOSIS — F0390 Unspecified dementia without behavioral disturbance: Secondary | ICD-10-CM | POA: Diagnosis not present

## 2016-12-27 LAB — CBC WITH DIFFERENTIAL/PLATELET
BASOS ABS: 0 10*3/uL (ref 0.0–0.1)
Basophils Relative: 1 %
Eosinophils Absolute: 0.2 10*3/uL (ref 0.0–0.7)
Eosinophils Relative: 4 %
HCT: 32.5 % — ABNORMAL LOW (ref 36.0–46.0)
Hemoglobin: 10.5 g/dL — ABNORMAL LOW (ref 12.0–15.0)
Lymphocytes Relative: 33 %
Lymphs Abs: 1.4 10*3/uL (ref 0.7–4.0)
MCH: 28.7 pg (ref 26.0–34.0)
MCHC: 32.3 g/dL (ref 30.0–36.0)
MCV: 88.8 fL (ref 78.0–100.0)
MONOS PCT: 9 %
Monocytes Absolute: 0.4 10*3/uL (ref 0.1–1.0)
NEUTROS ABS: 2.3 10*3/uL (ref 1.7–7.7)
NEUTROS PCT: 53 %
Platelets: 300 10*3/uL (ref 150–400)
RBC: 3.66 MIL/uL — AB (ref 3.87–5.11)
RDW: 15 % (ref 11.5–15.5)
WBC: 4.2 10*3/uL (ref 4.0–10.5)

## 2016-12-27 LAB — LIPID PANEL
CHOL/HDL RATIO: 2.5 ratio
CHOLESTEROL: 118 mg/dL (ref 0–200)
HDL: 47 mg/dL (ref >40)
LDL CALC: 53 mg/dL (ref 0–99)
Triglycerides: 92 mg/dL (ref <150)
VLDL: 18 mg/dL (ref 0–40)

## 2016-12-27 LAB — COMPREHENSIVE METABOLIC PANEL
ALT: 11 U/L — ABNORMAL LOW (ref 14–54)
AST: 20 U/L (ref 15–41)
Albumin: 3 g/dL — ABNORMAL LOW (ref 3.5–5.0)
Alkaline Phosphatase: 47 U/L (ref 38–126)
Anion gap: 7 (ref 5–15)
BILIRUBIN TOTAL: 0.2 mg/dL — AB (ref 0.3–1.2)
BUN: 25 mg/dL — AB (ref 6–20)
CO2: 28 mmol/L (ref 22–32)
Calcium: 9 mg/dL (ref 8.9–10.3)
Chloride: 103 mmol/L (ref 101–111)
Creatinine, Ser: 0.66 mg/dL (ref 0.44–1.00)
GFR calc Af Amer: >60 mL/min (ref >60)
GFR calc non Af Amer: >60 mL/min (ref >60)
GLUCOSE: 116 mg/dL — AB (ref 65–99)
POTASSIUM: 3.7 mmol/L (ref 3.5–5.1)
Sodium: 138 mmol/L (ref 135–145)
TOTAL PROTEIN: 7.2 g/dL (ref 6.5–8.1)

## 2016-12-28 ENCOUNTER — Encounter (HOSPITAL_COMMUNITY)
Admission: RE | Admit: 2016-12-28 | Discharge: 2016-12-28 | Disposition: A | Discharge status: Home / Self Care | Payer: Medicare Other | Source: Skilled Nursing Facility | Attending: Internal Medicine | Admitting: Internal Medicine

## 2016-12-28 DIAGNOSIS — M1990 Unspecified osteoarthritis, unspecified site: Secondary | ICD-10-CM | POA: Insufficient documentation

## 2016-12-28 DIAGNOSIS — F0390 Unspecified dementia without behavioral disturbance: Secondary | ICD-10-CM | POA: Diagnosis not present

## 2016-12-28 DIAGNOSIS — M6281 Muscle weakness (generalized): Secondary | ICD-10-CM | POA: Insufficient documentation

## 2016-12-28 DIAGNOSIS — Z48815 Encounter for surgical aftercare following surgery on the digestive system: Secondary | ICD-10-CM | POA: Insufficient documentation

## 2016-12-28 DIAGNOSIS — M25511 Pain in right shoulder: Secondary | ICD-10-CM | POA: Diagnosis not present

## 2016-12-28 DIAGNOSIS — Z9181 History of falling: Secondary | ICD-10-CM | POA: Insufficient documentation

## 2016-12-28 DIAGNOSIS — R279 Unspecified lack of coordination: Secondary | ICD-10-CM | POA: Diagnosis not present

## 2016-12-29 DIAGNOSIS — R279 Unspecified lack of coordination: Secondary | ICD-10-CM | POA: Diagnosis not present

## 2016-12-29 DIAGNOSIS — F0390 Unspecified dementia without behavioral disturbance: Secondary | ICD-10-CM | POA: Diagnosis not present

## 2016-12-29 DIAGNOSIS — M6281 Muscle weakness (generalized): Secondary | ICD-10-CM | POA: Diagnosis not present

## 2016-12-29 DIAGNOSIS — M1990 Unspecified osteoarthritis, unspecified site: Secondary | ICD-10-CM | POA: Diagnosis not present

## 2016-12-29 DIAGNOSIS — M25511 Pain in right shoulder: Secondary | ICD-10-CM | POA: Diagnosis not present

## 2016-12-29 LAB — VITAMIN D 25 HYDROXY (VIT D DEFICIENCY, FRACTURES): Vit D, 25-Hydroxy: 48.1 ng/mL (ref 30.0–100.0)

## 2017-01-04 DIAGNOSIS — M6281 Muscle weakness (generalized): Secondary | ICD-10-CM | POA: Diagnosis not present

## 2017-01-04 DIAGNOSIS — B351 Tinea unguium: Secondary | ICD-10-CM | POA: Diagnosis not present

## 2017-01-04 DIAGNOSIS — I70203 Unspecified atherosclerosis of native arteries of extremities, bilateral legs: Secondary | ICD-10-CM | POA: Diagnosis not present

## 2017-01-21 ENCOUNTER — Encounter: Payer: Self-pay | Admitting: Internal Medicine

## 2017-01-21 ENCOUNTER — Non-Acute Institutional Stay (SKILLED_NURSING_FACILITY): Payer: Medicare Other | Admitting: Internal Medicine

## 2017-01-21 DIAGNOSIS — R0989 Other specified symptoms and signs involving the circulatory and respiratory systems: Secondary | ICD-10-CM

## 2017-01-21 DIAGNOSIS — R05 Cough: Secondary | ICD-10-CM

## 2017-01-21 DIAGNOSIS — R059 Cough, unspecified: Secondary | ICD-10-CM

## 2017-01-21 NOTE — Progress Notes (Signed)
Location:   [place] Room Number: 157/W Place of Service:  SNF [phone]) Provider:  [name], MD  Patient Care Team: [name], MD as PCP - General (Internal Medicine)  Extended Emergency Contact Information Primary Emergency Contact: [name] Address: [address]          [place],  [place] Home Phone: [phone] Relation: None Secondary Emergency Contact: [place], [place] [address] [name] of [place] Phone: [phone] Relation: Grandson  Code Status:  DNR Goals of care: Advanced Directive information Advanced Directives 01/21/2017  Does Patient Have a Medical Advance Directive? Yes  Type of Advance Directive Out of facility DNR (pink MOST or yellow form)  Does patient want to make changes to medical advance directive? No - Patient declined  Copy of [place] in Chart? -  Would patient like information on creating a medical advance directive? -  Pre-existing out of facility DNR order (yellow form or pink MOST form) -     Chief Complaint  Patient presents with  . Acute Visit    Cough and runny nose    HPI:  Pt is a 83 y.o. female seen today for an acute visit forFor complaints of cough with running nose-according to her daughter who is very attentive patient has had a runny nose for last couple days-no acute shortness of breath has been noted cough is minimally productive-.  Patient does have a history of dementia and is a poor historian but appears to be at her baseline today resting comfortably in her wheelchair she is bright and alert.  She was treated several months ago with Flonase and Mucinex and apparently this was quite effective.   She does have a history of anemia hypothyroidism hypertension chronic osteoarthritic pain and dementia among other issues    Past Medical History:  Diagnosis Date  . Anemia   . Arthritis   . Bradycardia   . Cancer [facility])     had breast cancer 8 yrs ago  . 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 GERD (gastroesophageal reflux disease)   . HCAP (healthcare-associated pneumonia)   . Hypertension   . Hypokalemia   . Hypomagnesemia   . Osteoarthritis   . Pancreatitis, acute 05/03/2015  . Thyroid disease   . Urinary tract infection 02/07/16   Coag negative staph. Nitrofurantoin prescribed   . UTI (lower urinary tract infection) 04/03/2016   Proteus treated with Septra DS   Past Surgical History:  Procedure Laterality Date  . BREAST SURGERY    . ERCP N/A 01/22/2016   Procedure: ENDOSCOPIC RETROGRADE CHOLANGIOPANCREATOGRAPHY (ERCP) With biliary sphincterotomy and stone extraction ;  Surgeon: Rogene Houston, MD;  Location: [facility] ORS;  Service: Endoscopy;  Laterality: N/A;  . ESOPHAGOGASTRODUODENOSCOPY N/A 05/07/2015   Procedure: ESOPHAGOGASTRODUODENOSCOPY (EGD);  Surgeon: Rogene Houston, MD;  Location: [facility] ENDO SUITE;  Service: Endoscopy;  Laterality: N/A;    Allergies  Allergen Reactions  . Codeine Nausea And Vomiting  . Nsaids     Outpatient Encounter Prescriptions as of 01/21/2017  Medication Sig  . acetaminophen (TYLENOL) 325 MG tablet Take 650 mg by mouth every 6 (six) hours as needed.  . Amino Acids-Protein Hydrolys (FEEDING SUPPLEMENT, PRO-STAT SUGAR FREE 64,) LIQD ProStat 30 ml TID mix with 4 oz water or juice three times a day between meals  . Balsam [place]-Castor Oil (VENELEX) OINT Apply  to sacrum and bilateral buttock every shift and as needed for prevention  . Cholecalciferol 1000 units TBDP Take 1 tablet by mouth daily  . cloNIDine (CATAPRES) 0.2 MG tablet Take 0.2 mg by mouth at bedtime.  . docusate sodium (COLACE) 100 MG capsule Take 100 mg by mouth every morning.  . ferrous sulfate (FERROUSUL) 325 (65 FE) MG tablet Take 325 mg by mouth daily with breakfast.  . levothyroxine (SYNTHROID, LEVOTHROID) 175 MCG tablet Take 175 mcg by mouth daily before breakfast.  . losartan (COZAAR)  50 MG tablet Take 50 mg by mouth daily.  . Multiple Vitamins-Minerals (MULTIVITAMIN WITH MINERALS) tablet Take 1 tablet by mouth daily.  Marland Kitchen omeprazole (PRILOSEC) 20 MG capsule Take 20 mg by mouth daily.  . polyvinyl alcohol (LIQUIFILM TEARS) 1.4 % ophthalmic solution Place 1 drop into both eyes every 4 (four) hours.   . simvastatin (ZOCOR) 10 MG tablet Take 10 mg by mouth at bedtime.  . traMADol (ULTRAM) 50 MG tablet Take 100 mg by mouth at bedtime.  . [DISCONTINUED] ciprofloxacin (CIPRO) 250 MG tablet Take 250 mg by mouth 2 (two) times daily.  . [DISCONTINUED] Probiotic Product (RISA-BID PROBIOTIC PO) Give 1 table by mouth twice a day   No facility-administered encounter medications on file as of 01/21/2017.     Review of Systems This is limited secondary to dementia provided by nursing staff as well as by her daughter.  General no complaints of fever or chills.  Head ears eyes nose mouth and throat does complain of some clear eye drainage and also some nasal drainage gray discharge.  Respiratory does not quite shortness of breath does have a cough that appears to be minimally productive.  Cardiac does not complaining of chest pain has baseline lower extremity edema.  GI no reports of nausea vomiting diarrhea constipation or abdominal discomfort.  GU does not complain of dysuria does have a history of UTIs.  Muscle skeletal does have diffuse osteoarthritic pain but appears to be fairly comfortable today is not complaining of pain.  Neurologic is not complaining dizziness headache or numbness.  Psych does have a history of dementia but does well with supportive care at times will have periods of agitation Immunization History  Administered Date(s) Administered  . Influenza,inj,Quad PF,36+ Mos 05/04/2015  . Influenza-Unspecified 05/28/2016  . Pneumococcal-Unspecified 06/10/2016   Pertinent  Health Maintenance Due  Topic Date Due  . DEXA SCAN  02/09/2017 (Originally 11/30/1988)  .  INFLUENZA VACCINE  03/30/2017  . PNA vac Low Risk Adult (2 of 2 - PCV13) 06/10/2017   No flowsheet data found. Functional Status Survey:    Temperature is 98.7 pulse 88 respirations 18 blood pressure 103/79 O2 sats in the 90s on room air  Physical Exam \\In  general this is a frail elderly female she is alert and sitting comfortably in her wheelchair   Her skin is warm and dry.She is not diaphoretic -      Oropharynx mucous membranes appearmoist appears to have a small amount of clear drainage.  Eyes does have some clear drainage from her eyes bilaterally visual acuity appears grossly intact sclera R just slightly erythematous  Her heart is regular rate and rhythm with a slight murmur she has mild lower extremity edema which appears relatively baseline  Her chest is largely clear to auscultation --there is no labored breathing there is shallow air entry. Has some mild bronchial sounds at the bases  Her abdomen is soft does not appear to be tender --bowel  sounds are positive.  Musculoskeletal does has general weakness lower extremities which is baseli continues with diffuse arthritic changes        Neurologic could not really appreciate lateralizing findings or changes from baseline cranial nerves are grossly intact.  Psych she does have significant dementia she is pleasant smiling and does speak when addressed a bit more vocal than I have seen her in the past- Labs reviewed:  Recent Labs  09/01/16 1700 10/29/16 0700 12/27/16 0900  NA 142 135 138  K 4.0 4.2 3.7  CL 109 101 103  CO2 26 27 28   GLUCOSE 110* 87 116*  BUN 27* 17 25*  CREATININE 0.81 0.61 0.66  CALCIUM 8.9 8.8* 9.0    Recent Labs  06/10/16 0500 09/01/16 1700 12/27/16 0900  AST 17 18 20   ALT 11* 12* 11*  ALKPHOS 41 47 47  BILITOT 0.4 0.2* 0.2*  PROT 7.1 7.2 7.2  ALBUMIN 2.9* 2.8* 3.0*    Recent Labs  09/28/16 0700 10/05/16 0930 10/29/16 0700 12/27/16 0900  WBC 4.3  5.1 4.4 4.2  NEUTROABS 1.6*  --  2.2 2.3  HGB 9.3* 9.7* 10.8* 10.5*  HCT 28.4* 29.4* 33.4* 32.5*  MCV 88.8 87.5 88.4 88.8  PLT 291 291 284 300   Lab Results  Component Value Date   TSH 1.813 10/29/2016   No results found for: HGBA1C Lab Results  Component Value Date   CHOL 118 12/27/2016   HDL 47 12/27/2016   LDLCALC 53 12/27/2016   TRIG 92 12/27/2016   CHOLHDL 2.5 12/27/2016    Significant Diagnostic Results in last 30 days:  No results found.  Assessment/Plan Cough with runny nose-will treat once again with Mucinex 600 mg twice a day for 7 days as well as Flonase 1 spray nostril twice a day for 5 days.  Will obtain a chest x-ray PA and lateral.  Also monitored closely vital  signs pulse ox every shift for 72 hours.  Currently she appears stable actually bit more talkative than I have seen her in the past but this will have to be watched.  [place], [place], [place]

## 2017-01-24 ENCOUNTER — Non-Acute Institutional Stay (SKILLED_NURSING_FACILITY): Payer: Medicare Other | Admitting: Internal Medicine

## 2017-01-24 DIAGNOSIS — J189 Pneumonia, unspecified organism: Secondary | ICD-10-CM

## 2017-01-24 DIAGNOSIS — I10 Essential (primary) hypertension: Secondary | ICD-10-CM

## 2017-01-24 NOTE — Progress Notes (Signed)
This is an acute visit.  Level care skilled.  Facility is [facility].  Chief complaint acute visit follow-up cough with suspected pneumonia.  History of present illness.  Patient is a 83 year old female who was seen last Friday for complaints of cough with runny nose.  She has had this in the past at times has had a history of pneumonia.  She was most recent treated successfully with Flonase and Mucinex and these medications were restarted on Friday.  A chest x-ray also was ordered.  I just received the results of the x-ray this morning does show possibility of a right sided patchy infiltrate suspicious for pneumonia.  According to her daughter she continues to have some congestion and cough as well as runny nose.  Her vital signs appear to be stable I see listed blood pressure actually had a diastolic of 993 but I checked it manually and got a blood pressure of 160/80-she did receive a doseof clonidine which may be bringing a blood pressure down-- She is on clonidine routinely 0.2 mg at night as well as Cozaar 50 mg a day  Past Medical History:  Diagnosis Date  . Anemia   . Arthritis   . Bradycardia   . Cancer [facility])    had breast cancer 8 yrs ago  . 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 GERD (gastroesophageal reflux disease)   . HCAP (healthcare-associated pneumonia)   . Hypertension   . Hypokalemia   . Hypomagnesemia   . Osteoarthritis   . Pancreatitis, acute 05/03/2015  . Thyroid disease   . Urinary tract infection 02/07/16   Coag negative staph. Nitrofurantoin prescribed   . UTI (lower urinary tract infection) 04/03/2016   Proteus treated with Septra DS        Past Surgical History:  Procedure Laterality Date  . BREAST SURGERY    . ERCP N/A 01/22/2016   Procedure: ENDOSCOPIC RETROGRADE CHOLANGIOPANCREATOGRAPHY (ERCP) With biliary sphincterotomy and stone extraction ;  Surgeon: Rogene Houston, MD;  Location: [facility] ORS;   Service: Endoscopy;  Laterality: N/A;  . ESOPHAGOGASTRODUODENOSCOPY N/A 05/07/2015   Procedure: ESOPHAGOGASTRODUODENOSCOPY (EGD);  Surgeon: Rogene Houston, MD;  Location: [facility];  Service: Endoscopy;  Laterality: N/A;        Allergies  Allergen Reactions  . Codeine Nausea And Vomiting  . Nsaids         Outpatient Encounter Prescriptions as of 01/21/2017  Medication Sig  . acetaminophen (TYLENOL) 325 MG tablet Take 650 mg by mouth every 6 (six) hours as needed.  . Amino Acids-Protein Hydrolys (FEEDING SUPPLEMENT, PRO-STAT SUGAR FREE 64,) LIQD ProStat 30 ml TID mix with 4 oz water or juice three times a day between meals  . Balsam [place]-Castor Oil (VENELEX) OINT Apply to sacrum and bilateral buttock every shift and as needed for prevention  . Cholecalciferol 1000 units TBDP Take 1 tablet by mouth daily  . cloNIDine (CATAPRES) 0.2 MG tablet Take 0.2 mg by mouth at bedtime.  . docusate sodium (COLACE) 100 MG capsule Take 100 mg by mouth every morning.  . ferrous sulfate (FERROUSUL) 325 (65 FE) MG tablet Take 325 mg by mouth daily with breakfast.  . levothyroxine (SYNTHROID, LEVOTHROID) 175 MCG tablet Take 175 mcg by mouth daily before breakfast.  . losartan (COZAAR) 50 MG tablet Take 50 mg by mouth daily.  . Multiple Vitamins-Minerals (MULTIVITAMIN WITH MINERALS) tablet Take 1 tablet by mouth daily.  Marland Kitchen omeprazole (PRILOSEC) 20 MG capsule Take  20 mg by mouth daily.  . polyvinyl alcohol (LIQUIFILM TEARS) 1.4 % ophthalmic solution Place 1 drop into both eyes every 4 (four) hours.   . simvastatin (ZOCOR) 10 MG tablet Take 10 mg by mouth at bedtime.  . traMADol (ULTRAM) 50 MG tablet Take 100 mg by mouth at bedtime.  . [DISCONTINUED] ciprofloxacin (CIPRO) 250 MG tablet Take 250 mg by mouth 2 (two) times daily.  . [DISCONTINUED] Probiotic Product (RISA-BID PROBIOTIC PO) Give 1 table by mouth twice a day        Review of Systems This is limited secondary to dementia provided by  nursing staff as well as by her daughter.  General no complaints of fever or chills.Daughter feels she's not at her baseline bit weaker  Head ears eyes nose mouth and throat-continues at times to have a runny nose per daughter  Respiratory does not complain of shortness of breath but still has a cough according to her daughter  Cardiac does not complaining of chest pain has baseline lower extremity edema.  GI no reports of nausea vomiting diarrhea constipation or abdominal discomfort. Appears to still have a fairly decent appetite  GU does not complain of dysuria does have a history of UTIs.  Muscle skeletal does have diffuse osteoarthritic pain but appears to be fairly comfortable today is not complaining of pain.  Neurologic is not complaining dizziness headache or numbness.  Psych does have a history of dementia but does well with supportive care at times will have periods of agitation     Immunization History  Administered Date(s) Administered  . Influenza,inj,Quad PF,36+ Mos 05/04/2015  . Influenza-Unspecified 05/28/2016  . Pneumococcal-Unspecified 06/10/2016       Pertinent  Health Maintenance Due  Topic Date Due  . DEXA SCAN  02/09/2017 (Originally 11/30/1986)  . INFLUENZA VACCINE  03/30/2017  . PNA vac Low Risk Adult (2 of 2 - PCV13) 06/10/2017   No flowsheet data found. Functional Status Survey:   Temperature is 97.0 pulse 84 respirations of 18 O2 saturation 96% on room air blood pressure taken manually was 160/80  Physical Exam \\In  general this is a frail elderly female she is alert and sitting comfortably in her wheelchair   Her skin is warm and dry.She is not diaphoretic -      Oropharynx mucous membranes appearmoist  still appears to have a small amount of clear drainage.  Eyes does have some clear drainage from her eyes bilaterally visual acuity appears grossly intact   Her heart is regular rate and rhythm with a slight murmur  she has mild lower extremity edema which appears relatively baseline  Her chest --there is no labored breathing there is shallow air entry.  as some diffuse bronchial sounds more so at the bases  Her abdomen is soft does not appear to be tender --bowel sounds are positive.  Musculoskeletal does has general weakness lower extremities which is baseli continues with diffuse arthritic changes        Neurologic could not really appreciate lateralizing findings or changes from baseline cranial nerves are grossly intact.  Psych she does have significant dementia she is pleasant smiling and does speak as much as she did on Friday Labs reviewed:  RecentLabs(withinlast365days)   Recent Labs  09/01/16 1700 10/29/16 0700 12/27/16 0900  NA 142 135 138  K 4.0 4.2 3.7  CL 109 101 103  CO2 26 27 28   GLUCOSE 110* 87 116*  BUN 27* 17 25*  CREATININE 0.81 0.61 0.66  CALCIUM 8.9 8.8* 9.0      RecentLabs(withinlast365days)   Recent Labs  06/10/16 0500 09/01/16 1700 12/27/16 0900  AST 17 18 20   ALT 11* 12* 11*  ALKPHOS 41 47 47  BILITOT 0.4 0.2* 0.2*  PROT 7.1 7.2 7.2  ALBUMIN 2.9* 2.8* 3.0*      RecentLabs(withinlast365days)   Assessment and plan.  #1 suspected pneumonia per x-ray reviewed today-she has been on Levaquin before will start this 500 mg daily for 7 days-as well as a probiotic twice a day for 10 days.  Continue with the Mucinex and Flonase for now-also will start duo nebs every 6 hours when necessary for 5 days.  With patient's history dementia unclear if she will cooperate with the nebulizers but nursing feels that with encouragement she will again will have these available.  Continue to monitor vital signs pulse ox every shift.  Also will update a CBC with differential and a metabolic panel tomorrow.   #2 hypertension-appears to have at times some variable blood pressures at this point will continue with the Cozaar 50 mg  a day as well as clonidine 0.2 mg at night-again she got a clonidine a bit early today and blood pressure appears to be going down this apparently is intermittent and not a consistent issue but this will have to be watched again will monitor her vital signs pulse ox every shift for now [id number]

## 2017-01-25 ENCOUNTER — Other Ambulatory Visit: Payer: Self-pay | Admitting: *Deleted

## 2017-01-25 ENCOUNTER — Encounter (HOSPITAL_COMMUNITY)
Admission: RE | Admit: 2017-01-25 | Discharge: 2017-01-25 | Disposition: A | Discharge status: Home / Self Care | Payer: Medicare Other | Source: Skilled Nursing Facility | Attending: Internal Medicine | Admitting: Internal Medicine

## 2017-01-25 DIAGNOSIS — Z9181 History of falling: Secondary | ICD-10-CM | POA: Diagnosis not present

## 2017-01-25 DIAGNOSIS — Z48815 Encounter for surgical aftercare following surgery on the digestive system: Secondary | ICD-10-CM | POA: Insufficient documentation

## 2017-01-25 DIAGNOSIS — F0390 Unspecified dementia without behavioral disturbance: Secondary | ICD-10-CM | POA: Insufficient documentation

## 2017-01-25 DIAGNOSIS — M25511 Pain in right shoulder: Secondary | ICD-10-CM | POA: Insufficient documentation

## 2017-01-25 LAB — CBC WITH DIFFERENTIAL/PLATELET
BASOS ABS: 0 10*3/uL (ref 0.0–0.1)
Basophils Relative: 1 %
EOS ABS: 0.2 10*3/uL (ref 0.0–0.7)
EOS PCT: 3 %
HCT: 31.3 % — ABNORMAL LOW (ref 36.0–46.0)
Hemoglobin: 9.9 g/dL — ABNORMAL LOW (ref 12.0–15.0)
LYMPHS PCT: 28 %
Lymphs Abs: 1.5 10*3/uL (ref 0.7–4.0)
MCH: 28.1 pg (ref 26.0–34.0)
MCHC: 31.6 g/dL (ref 30.0–36.0)
MCV: 88.9 fL (ref 78.0–100.0)
Monocytes Absolute: 0.6 10*3/uL (ref 0.1–1.0)
Monocytes Relative: 10 %
NEUTROS PCT: 58 %
Neutro Abs: 3.2 10*3/uL (ref 1.7–7.7)
PLATELETS: 262 10*3/uL (ref 150–400)
RBC: 3.52 MIL/uL — AB (ref 3.87–5.11)
RDW: 15.2 % (ref 11.5–15.5)
WBC: 5.4 10*3/uL (ref 4.0–10.5)

## 2017-01-25 LAB — COMPREHENSIVE METABOLIC PANEL
ALT: 11 U/L — AB (ref 14–54)
AST: 19 U/L (ref 15–41)
Albumin: 3 g/dL — ABNORMAL LOW (ref 3.5–5.0)
Alkaline Phosphatase: 48 U/L (ref 38–126)
Anion gap: 8 (ref 5–15)
BUN: 36 mg/dL — AB (ref 6–20)
CHLORIDE: 105 mmol/L (ref 101–111)
CO2: 26 mmol/L (ref 22–32)
CREATININE: 0.86 mg/dL (ref 0.44–1.00)
Calcium: 8.8 mg/dL — ABNORMAL LOW (ref 8.9–10.3)
GFR calc Af Amer: >60 mL/min (ref >60)
GFR calc non Af Amer: 56 mL/min — ABNORMAL LOW (ref >60)
GLUCOSE: 99 mg/dL (ref 65–99)
Potassium: 3.5 mmol/L (ref 3.5–5.1)
SODIUM: 139 mmol/L (ref 135–145)
Total Bilirubin: 0.5 mg/dL (ref 0.3–1.2)
Total Protein: 7.2 g/dL (ref 6.5–8.1)

## 2017-01-25 MED ORDER — TRAMADOL HCL 50 MG PO TABS: 100.0000 mg | ORAL_TABLET | Freq: Every day | ORAL | 0 refills | Status: DC

## 2017-01-25 NOTE — Telephone Encounter (Signed)
[facility] #[phone] Fax: [phone]   

## 2017-02-01 ENCOUNTER — Non-Acute Institutional Stay (SKILLED_NURSING_FACILITY): Payer: Medicare Other | Admitting: Internal Medicine

## 2017-02-01 ENCOUNTER — Encounter: Payer: Self-pay | Admitting: Internal Medicine

## 2017-02-01 DIAGNOSIS — J189 Pneumonia, unspecified organism: Secondary | ICD-10-CM | POA: Diagnosis not present

## 2017-02-01 DIAGNOSIS — R05 Cough: Secondary | ICD-10-CM

## 2017-02-01 DIAGNOSIS — I10 Essential (primary) hypertension: Secondary | ICD-10-CM | POA: Diagnosis not present

## 2017-02-01 DIAGNOSIS — R059 Cough, unspecified: Secondary | ICD-10-CM

## 2017-02-01 DIAGNOSIS — R0989 Other specified symptoms and signs involving the circulatory and respiratory systems: Secondary | ICD-10-CM | POA: Diagnosis not present

## 2017-02-01 NOTE — Progress Notes (Signed)
Location:   [facility] Room Number: 157/W Place of Service:  SNF [phone]) Provider:  [name], MD  Patient Care Team: [name], MD as PCP - General (Internal Medicine)  Extended Emergency Contact Information Primary Emergency Contact: [name] Address: [address]          [place],  [place] Home Phone: [phone] Relation: None Secondary Emergency Contact: [place], [place] [address] [name] of [place] Phone: [phone] Relation: Grandson  Code Status:  DNR Goals of care: Advanced Directive information Advanced Directives 02/01/2017  Does Patient Have a Medical Advance Directive? Yes  Type of Advance Directive Out of facility DNR (pink MOST or yellow form)  Does patient want to make changes to medical advance directive? No - Patient declined  Copy of [place] in Chart? -  Would patient like information on creating a medical advance directive? -  Pre-existing out of facility DNR order (yellow form or pink MOST form) -     Chief Complaint  Patient presents with  . Acute Visit    Runn y Nose and Cough    HPI:  Pt is a 83 y.o. female seen today for an acute visit for Complaints of runny nose and cough.--She has completed a course of Levaquin for suspected right sided infiltrate-I saw her last week for some cough and runny nose as well and started Mucinex as well as Flonase which she has since completed.  She also had an order for nebulizers for 5 days prn--.  According to her daughter she appeared to get somewhat better but the last day or so apparently he has had some increase cough and runny nose again.   Vital signs appear to be stable her systolic blood pressure is somewhat elevated 164 although she has some variability and appears this is not consistent recent blood pressures 137/82 140/78 I actually see 106/64 is within the last few days. I did recheck it  manually and got 140/64  She is on clonidine 0.2 mg at night as well as Cozaar 50 mg daily.  Patient does have some history dementia but she does not have any overt complaints today however daughter is concerned about the right nose and possibly some increased congestion about diarrhea       Past Medical History:  Diagnosis Date  . Anemia   . Arthritis   . Bradycardia   . Cancer [facility])    had breast cancer 8 yrs ago  . 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 GERD (gastroesophageal reflux disease)   . HCAP (healthcare-associated pneumonia)   . Hypertension   . Hypokalemia   . Hypomagnesemia   . Osteoarthritis   . Pancreatitis, acute 05/03/2015  . Thyroid disease   . Urinary tract infection 02/07/16   Coag negative staph. Nitrofurantoin prescribed   . UTI (lower urinary tract infection) 04/03/2016   Proteus treated with Septra DS   Past Surgical History:  Procedure Laterality Date  . BREAST SURGERY    . ERCP N/A 01/22/2016   Procedure: ENDOSCOPIC RETROGRADE CHOLANGIOPANCREATOGRAPHY (ERCP) With biliary sphincterotomy and stone extraction ;  Surgeon: Rogene Houston, MD;  Location: [facility] ORS;  Service: Endoscopy;  Laterality: N/A;  . ESOPHAGOGASTRODUODENOSCOPY N/A 05/07/2015   Procedure: ESOPHAGOGASTRODUODENOSCOPY (EGD);  Surgeon: Rogene Houston, MD;  Location: [facility] ENDO SUITE;  Service: Endoscopy;  Laterality: N/A;  Allergies  Allergen Reactions  . Codeine Nausea And Vomiting  . Nsaids     Outpatient Encounter Prescriptions as of 02/01/2017  Medication Sig  . acetaminophen (TYLENOL) 325 MG tablet Take 650 mg by mouth every 6 (six) hours as needed.  . Amino Acids-Protein Hydrolys (FEEDING SUPPLEMENT, PRO-STAT SUGAR FREE 64,) LIQD ProStat 30 ml TID mix with 4 oz water or juice three times a day between meals  . Balsam [place]-Castor Oil (VENELEX) OINT Apply to sacrum and bilateral buttock every shift and as needed for prevention  . Cholecalciferol 1000 units TBDP  Take 1 tablet by mouth daily  . cloNIDine (CATAPRES) 0.2 MG tablet Take 0.2 mg by mouth at bedtime.  . docusate sodium (COLACE) 100 MG capsule Take 100 mg by mouth every morning.  . ferrous sulfate (FERROUSUL) 325 (65 FE) MG tablet Take 325 mg by mouth daily with breakfast.  . levothyroxine (SYNTHROID, LEVOTHROID) 175 MCG tablet Take 175 mcg by mouth daily before breakfast.  . losartan (COZAAR) 50 MG tablet Take 50 mg by mouth daily.  . Multiple Vitamins-Minerals (MULTIVITAMIN WITH MINERALS) tablet Take 1 tablet by mouth daily.  Marland Kitchen omeprazole (PRILOSEC) 20 MG capsule Take 20 mg by mouth daily.  . polyvinyl alcohol (LIQUIFILM TEARS) 1.4 % ophthalmic solution Place 1 drop into both eyes every 4 (four) hours.   . Probiotic Product (RISA-BID PROBIOTIC PO) Give 100 mg tablet by mouth twice a day  . simvastatin (ZOCOR) 10 MG tablet Take 10 mg by mouth at bedtime.  . traMADol (ULTRAM) 50 MG tablet Take 2 tablets (100 mg total) by mouth at bedtime.   No facility-administered encounter medications on file as of 02/01/2017.     Review of Systems   This is limited secondary to dementia provided by nursing staff as well as by her daughter.  General no complaints of fever or chills.Daughter feels she's still not at her baseline but did have a couple better days  Head ears eyes nose mouth and throat-continues at times to have a runny nose per daughter  Respiratory does not complain of shortness of breath but still has a cough according to her daughter  Cardiac does not complaining of chest pain has baseline lower extremity edema.  GI no reports of nausea vomiting diarrhea constipation or abdominal discomfort. Appears to still have a fairly decent appetite per her daughter in nursing staff  GU does not complain of dysuria does have a history of UTIs.  Muscle skeletal does have diffuse osteoarthritic pain but appears to be fairly comfortable today is not complaining of pain.  Neurologic is not  complaining dizziness headache or numbness.  Psych does have a history of dementia but does well with supportive care at times will have periods of agitation Immunization History  Administered Date(s) Administered  . Influenza,inj,Quad PF,36+ Mos 05/04/2015  . Influenza-Unspecified 05/28/2016  . Pneumococcal-Unspecified 06/10/2016   Pertinent  Health Maintenance Due  Topic Date Due  . DEXA SCAN  02/09/2017 (Originally 11/30/1986)  . INFLUENZA VACCINE  03/30/2017  . PNA vac Low Risk Adult (2 of 2 - PCV13) 06/10/2017   No flowsheet data found. Functional Status Survey:    Vitals:   02/01/17 1516  BP: (!) 164/91  Pulse: 72  Resp: 18  Temp: (!) 95.5 F (35.3 C)  TempSrc: Oral  SpO2: 90%    Physical Exam \\In  general this is a frail elderly female she is alert and sitting comfortably in her wheelchair   Her skin is warm and  dry.She is not diaphoretic -      Oropharynx mucous membranes appear moist.  Eyes does have some clear drainage from her eyes bilaterally visual acuity appears grossly intact   Her heart is regular rate and rhythm with a slight murmur she has mild lower extremity edema which appears relatively baseline  Her chest --there is has some minimal bronchial sounds more so anterior fields no labored breathing there is shallow air entry. Has some minimal bronchial sounds more so anterior fields--   Her abdomen is soft does not appear to be tender --bowel sounds are positive.  Musculoskeletal does has general weakness lower extremities which is baselicontinues with diffuse arthriticchanges        Neurologic could not really appreciate lateralizing findings or changes from baseline cranial nerves are grossly intact.  Psych she does have significant dementia she is pleasant smiling   does speak when spoken to ewed:  Labs reviewed:  Recent Labs  10/29/16 0700 12/27/16 0900 01/25/17 0715  NA 135 138 139  K 4.2 3.7 3.5    CL 101 103 105  CO2 27 28 26   GLUCOSE 87 116* 99  BUN 17 25* 36*  CREATININE 0.61 0.66 0.86  CALCIUM 8.8* 9.0 8.8*    Recent Labs  09/01/16 1700 12/27/16 0900 01/25/17 0715  AST 18 20 19   ALT 12* 11* 11*  ALKPHOS 47 47 48  BILITOT 0.2* 0.2* 0.5  PROT 7.2 7.2 7.2  ALBUMIN 2.8* 3.0* 3.0*    Recent Labs  10/29/16 0700 12/27/16 0900 01/25/17 0715  WBC 4.4 4.2 5.4  NEUTROABS 2.2 2.3 3.2  HGB 10.8* 10.5* 9.9*  HCT 33.4* 32.5* 31.3*  MCV 88.4 88.8 88.9  PLT 284 300 262   Lab Results  Component Value Date   TSH 1.813 10/29/2016   No results found for: HGBA1C Lab Results  Component Value Date   CHOL 118 12/27/2016   HDL 47 12/27/2016   LDLCALC 53 12/27/2016   TRIG 92 12/27/2016   CHOLHDL 2.5 12/27/2016    Significant Diagnostic Results in last 30 days:  No results found.  Assessment/Plan  #1-continued runny nose with cough-with history of some possible right-sided infiltrate-at this point patient appears to be stable but her daughter feels she is having some runny nose and continued cough and not at her baseline yet-will reorder chest x-ray to follow-up on this.  Also will restart Mucinex 600 mg twice a day an extended course of 10 days-also will restart Flonase 1 spray twice a day each nostril for 7 days apparently she has benefited some from this.  Also will reinitiate nebulizers every 6 hours routine for 72 hours and then when necessary.  Continue to monitor vital signs and pulse ox every shift for 48 hours as well.  #2 hypertension-at times will have systolic spikes but this does not appear to be consistent as noted above at this point will monitor continue current medications including Cozaar 50 mg a day and clonidine 0.2 mg daily at bedtime  [id number]

## 2017-02-09 ENCOUNTER — Encounter: Payer: Self-pay | Admitting: Internal Medicine

## 2017-02-09 ENCOUNTER — Non-Acute Institutional Stay (SKILLED_NURSING_FACILITY): Payer: Medicare Other | Admitting: Internal Medicine

## 2017-02-09 DIAGNOSIS — R635 Abnormal weight gain: Secondary | ICD-10-CM | POA: Diagnosis not present

## 2017-02-09 DIAGNOSIS — I10 Essential (primary) hypertension: Secondary | ICD-10-CM

## 2017-02-09 DIAGNOSIS — F0281 Dementia in other diseases classified elsewhere with behavioral disturbance: Secondary | ICD-10-CM

## 2017-02-09 DIAGNOSIS — G309 Alzheimer's disease, unspecified: Secondary | ICD-10-CM

## 2017-02-09 DIAGNOSIS — E039 Hypothyroidism, unspecified: Secondary | ICD-10-CM | POA: Diagnosis not present

## 2017-02-09 DIAGNOSIS — D509 Iron deficiency anemia, unspecified: Secondary | ICD-10-CM | POA: Diagnosis not present

## 2017-02-09 NOTE — Progress Notes (Signed)
Location:   [facility] Room Number: 157/W Place of Service:  SNF (31) Provider:  Geralyn Flash, MD  Patient Care Team: [name], MD as PCP - General (Internal Medicine)  Extended Emergency Contact Information Primary Emergency Contact: [name] Address: [address]          [place],  [place] Home Phone: [phone] Relation: None Secondary Emergency Contact: [place], [place] [address] [name] of [place] Phone: [phone] Relation: Grandson  Code Status:  DNR Goals of care: Advanced Directive information Advanced Directives 02/09/2017  Does Patient Have a Medical Advance Directive? Yes  Type of Advance Directive Out of facility DNR (pink MOST or yellow form)  Does patient want to make changes to medical advance directive? No - Patient declined  Copy of [place] in Chart? -  Would patient like information on creating a medical advance directive? -  Pre-existing out of facility DNR order (yellow form or pink MOST form) -     Chief Complaint  Patient presents with  . Medical Management of Chronic Issues    Routine Visit    HPI:  Pt is a 83 y.o. female seen today for medical management of chronic diseases.    Patient has h/o Dementia, hypothyroidism, Hypertension and anemia  Patient recently was treated for Right Lower Lobe Infiltrate. Patient is doing well per Nurses and her daughter. She was sitting on the wheel chair and mostly says yes for everything. Her weight which had been stable before at 122 lbs has suddenly gone up to 133 lbs. No other Nursing issues.   Past Medical History:  Diagnosis Date  . Anemia   . Arthritis   . Bradycardia   . Cancer [facility])    had breast cancer 8 yrs ago  . 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 GERD (gastroesophageal reflux disease)   . HCAP (healthcare-associated pneumonia)   . Hypertension   .  Hypokalemia   . Hypomagnesemia   . Osteoarthritis   . Pancreatitis, acute 05/03/2015  . Thyroid disease   . Urinary tract infection 02/07/16   Coag negative staph. Nitrofurantoin prescribed   . UTI (lower urinary tract infection) 04/03/2016   Proteus treated with Septra DS   Past Surgical History:  Procedure Laterality Date  . BREAST SURGERY    . ERCP N/A 01/22/2016   Procedure: ENDOSCOPIC RETROGRADE CHOLANGIOPANCREATOGRAPHY (ERCP) With biliary sphincterotomy and stone extraction ;  Surgeon: Rogene Houston, MD;  Location: [facility] ORS;  Service: Endoscopy;  Laterality: N/A;  . ESOPHAGOGASTRODUODENOSCOPY N/A 05/07/2015   Procedure: ESOPHAGOGASTRODUODENOSCOPY (EGD);  Surgeon: Rogene Houston, MD;  Location: [facility] ENDO SUITE;  Service: Endoscopy;  Laterality: N/A;    Allergies  Allergen Reactions  . Codeine Nausea And Vomiting  . Nsaids     Outpatient Encounter Prescriptions as of 02/09/2017  Medication Sig  . acetaminophen (TYLENOL) 325 MG tablet Take 650 mg by mouth every 6 (six) hours as needed.  Roseanne Kaufman Peru-Castor Oil (VENELEX) OINT Apply to sacrum and bilateral buttock every shift and as needed for prevention  . Cholecalciferol 1000 units TBDP Take 1 tablet by mouth daily  . cloNIDine (CATAPRES) 0.2 MG tablet Take 0.2 mg by mouth at bedtime.  . docusate sodium (COLACE) 100 MG capsule Take 100 mg by mouth every morning.  . ferrous sulfate (FERROUSUL) 325 (65 FE) MG tablet Take 325 mg  by mouth daily with breakfast.  . guaiFENesin (MUCINEX) 600 MG 12 hr tablet Take 600 mg by mouth 2 (two) times daily.  Marland Kitchen levothyroxine (SYNTHROID, LEVOTHROID) 175 MCG tablet Take 175 mcg by mouth daily before breakfast.  . losartan (COZAAR) 50 MG tablet Take 50 mg by mouth daily.  . Multiple Vitamins-Minerals (MULTIVITAMIN WITH MINERALS) tablet Take 1 tablet by mouth daily.  Marland Kitchen omeprazole (PRILOSEC) 20 MG capsule Take 20 mg by mouth daily.  . polyvinyl alcohol (LIQUIFILM TEARS) 1.4 % ophthalmic solution Place 1  drop into both eyes every 4 (four) hours.   . simvastatin (ZOCOR) 10 MG tablet Take 10 mg by mouth at bedtime.  . traMADol (ULTRAM) 50 MG tablet Take 2 tablets (100 mg total) by mouth at bedtime.  . [DISCONTINUED] Amino Acids-Protein Hydrolys (FEEDING SUPPLEMENT, PRO-STAT SUGAR FREE 64,) LIQD ProStat 30 ml TID mix with 4 oz water or juice three times a day between meals  . [DISCONTINUED] Probiotic Product (RISA-BID PROBIOTIC PO) Give 100 mg tablet by mouth twice a day   No facility-administered encounter medications on file as of 02/09/2017.      Review of Systems  Unable to perform ROS: Dementia    Immunization History  Administered Date(s) Administered  . Influenza,inj,Quad PF,36+ Mos 05/04/2015  . Influenza-Unspecified 05/28/2016  . Pneumococcal-Unspecified 06/10/2016   Pertinent  Health Maintenance Due  Topic Date Due  . DEXA SCAN  02/09/2017 (Originally 11/30/1986)  . INFLUENZA VACCINE  03/30/2017  . PNA vac Low Risk Adult (2 of 2 - PCV13) 06/10/2017   No flowsheet data found. Functional Status Survey:    Vitals:   02/09/17 0916  BP: (!) 141/84  Pulse: 72  Resp: 16  Temp: 97.9 F (36.6 C)  TempSrc: Oral   There is no height or weight on file to calculate BMI. Physical Exam  Constitutional: She appears well-developed and well-nourished.  HENT:  Head: Normocephalic.  Mouth/Throat: Oropharynx is clear and moist.  Neck: Neck supple.  Cardiovascular: Normal rate, regular rhythm and normal heart sounds.   Pulmonary/Chest: Effort normal and breath sounds normal. No respiratory distress. She has no wheezes. She has some rales B/L Abdominal: Soft. Bowel sounds are normal. She exhibits no distension. There is no tenderness. There is no rebound.  Musculoskeletal:  Edema in both LE Patient does have more stiffness in right UE and Lower Extremity. She also has more swelling in right side. Resting tremors noticed.   Neurological: She is alert.  Not oriented  Skin: Skin is  warm and dry.  Psychiatric: She has a normal mood and affect.   Labs reviewed:  Recent Labs  10/29/16 0700 12/27/16 0900 01/25/17 0715  NA 135 138 139  K 4.2 3.7 3.5  CL 101 103 105  CO2 27 28 26   GLUCOSE 87 116* 99  BUN 17 25* 36*  CREATININE 0.61 0.66 0.86  CALCIUM 8.8* 9.0 8.8*    Recent Labs  09/01/16 1700 12/27/16 0900 01/25/17 0715  AST 18 20 19   ALT 12* 11* 11*  ALKPHOS 47 47 48  BILITOT 0.2* 0.2* 0.5  PROT 7.2 7.2 7.2  ALBUMIN 2.8* 3.0* 3.0*    Recent Labs  10/29/16 0700 12/27/16 0900 01/25/17 0715  WBC 4.4 4.2 5.4  NEUTROABS 2.2 2.3 3.2  HGB 10.8* 10.5* 9.9*  HCT 33.4* 32.5* 31.3*  MCV 88.4 88.8 88.9  PLT 284 300 262   Lab Results  Component Value Date   TSH 1.813 10/29/2016   No results found for:  HGBA1C Lab Results  Component Value Date   CHOL 118 12/27/2016   HDL 47 12/27/2016   LDLCALC 53 12/27/2016   TRIG 92 12/27/2016   CHOLHDL 2.5 12/27/2016    Significant Diagnostic Results in last 30 days:  No results found.  Assessment/Plan  Recent Pneumonia Repeat Chest Xray showed complete resolution  Recent weight Gain Patient looks Euvolemic. Her Xray was negative for any Pulmonary congestion Will contniue to follow her weight.  Hypothyroidism Continue same dose of synthroid. TSH was normal in 03/18  Essential hypertension BP stable  Iron deficiency anemia,  Hgb Stable on iron supplement. Guaiac negative in the past. On Prilosec. No more work up required. Dementia Stable and doing well Hyperlipidemia LDL was 53 in 04/18 Continue Simavastatin   Patient has had Xray showing Osteoporosis D/W daughter will get DEXA scan to assess her risk for Osteoporosis.  Family/ staff Communication:   Labs/tests ordered:

## 2017-02-16 ENCOUNTER — Encounter: Payer: Self-pay | Admitting: Internal Medicine

## 2017-02-16 ENCOUNTER — Other Ambulatory Visit: Payer: Self-pay

## 2017-02-16 ENCOUNTER — Non-Acute Institutional Stay (SKILLED_NURSING_FACILITY): Payer: Medicare Other | Admitting: Internal Medicine

## 2017-02-16 DIAGNOSIS — G309 Alzheimer's disease, unspecified: Secondary | ICD-10-CM | POA: Diagnosis not present

## 2017-02-16 DIAGNOSIS — G4700 Insomnia, unspecified: Secondary | ICD-10-CM

## 2017-02-16 DIAGNOSIS — F0281 Dementia in other diseases classified elsewhere with behavioral disturbance: Secondary | ICD-10-CM

## 2017-02-16 MED ORDER — TRAMADOL HCL 50 MG PO TABS: 100.0000 mg | ORAL_TABLET | Freq: Every day | ORAL | 0 refills | Status: DC

## 2017-02-16 NOTE — Progress Notes (Signed)
Location:   [facility] Room Number: 157/W Place of Service:  SNF [phone]) Provider:  [name], MD  Patient Care Team: [name], MD as PCP - General (Internal Medicine)  Extended Emergency Contact Information Primary Emergency Contact: [name] Address: [address]          [place],  [place] Home Phone: [phone] Relation: None Secondary Emergency Contact: [place], [place] [address] [name] of [place] Phone: [phone] Relation: Grandson  Code Status:  DNR Goals of care: Advanced Directive information Advanced Directives 02/16/2017  Does Patient Have a Medical Advance Directive? Yes  Type of Advance Directive Out of facility DNR (pink MOST or yellow form)  Does patient want to make changes to medical advance directive? No - Patient declined  Copy of [place] in Chart? -  Would patient like information on creating a medical advance directive? -  Pre-existing out of facility DNR order (yellow form or pink MOST form) -     Chief Complaint  Patient presents with  . Acute Visit    F/U  Acute visit follow-up nighttime restlessness family would like something for that--- with history of dementia  HPI:  Pt is a 83 y.o. female seen today for an acute visit for follow-up dementia that apparently has had some increased restlessness at night.  Patient is a pleasant 83 year old with a history of dementia who has done quite well with supportive care actually is gaining weight but thought this is actually appetite related-she does have a very supportive family who watches her closely. And helps with feeding  Her family feels she's had some nighttime restlessness and would like something to help her sleep.  Vital signs appear to be stable I got a blood pressure 118/72 manually this afternoon.  She is not agitated is pleasant and cooperative with exam this  afternoon.     Past Medical History:  Diagnosis Date  . Anemia   . Arthritis   . Bradycardia   . Cancer [facility])    had breast cancer 8 yrs ago  . 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 GERD (gastroesophageal reflux disease)   . HCAP (healthcare-associated pneumonia)   . Hypertension   . Hypokalemia   . Hypomagnesemia   . Osteoarthritis   . Pancreatitis, acute 05/03/2015  . Thyroid disease   . Urinary tract infection 02/07/16   Coag negative staph. Nitrofurantoin prescribed   . UTI (lower urinary tract infection) 04/03/2016   Proteus treated with Septra DS   Past Surgical History:  Procedure Laterality Date  . BREAST SURGERY    . ERCP N/A 01/22/2016   Procedure: ENDOSCOPIC RETROGRADE CHOLANGIOPANCREATOGRAPHY (ERCP) With biliary sphincterotomy and stone extraction ;  Surgeon: Rogene Houston, MD;  Location: [facility] ORS;  Service: Endoscopy;  Laterality: N/A;  . ESOPHAGOGASTRODUODENOSCOPY N/A 05/07/2015   Procedure: ESOPHAGOGASTRODUODENOSCOPY (EGD);  Surgeon: Rogene Houston, MD;  Location: [facility] ENDO SUITE;  Service: Endoscopy;  Laterality: N/A;    Allergies  Allergen Reactions  . Codeine Nausea And Vomiting  . Nsaids     Outpatient Encounter Prescriptions as of 02/16/2017  Medication Sig  . acetaminophen (TYLENOL) 325 MG tablet Take 650 mg by mouth every 6 (six) hours as needed.  Roseanne Kaufman Peru-Castor Oil (VENELEX) OINT Apply to sacrum and bilateral buttock every shift and as needed for prevention  . Cholecalciferol 1000 units  TBDP Take 1 tablet by mouth daily  . cloNIDine (CATAPRES) 0.2 MG tablet Take 0.2 mg by mouth at bedtime.  . docusate sodium (COLACE) 100 MG capsule Take 100 mg by mouth every morning.  . ferrous sulfate (FERROUSUL) 325 (65 FE) MG tablet Take 325 mg by mouth daily with breakfast.  . levothyroxine (SYNTHROID, LEVOTHROID) 175 MCG tablet Take 175 mcg by mouth daily before breakfast.  . losartan (COZAAR) 50 MG tablet Take 50 mg by mouth daily.  .  Melatonin 3 MG TABS Take 3 mg by mouth at bedtime as needed.  . Multiple Vitamins-Minerals (MULTIVITAMIN WITH MINERALS) tablet Take 1 tablet by mouth daily.  Marland Kitchen omeprazole (PRILOSEC) 20 MG capsule Take 20 mg by mouth daily.  . polyvinyl alcohol (LIQUIFILM TEARS) 1.4 % ophthalmic solution Place 1 drop into both eyes every 4 (four) hours.   . simvastatin (ZOCOR) 10 MG tablet Take 10 mg by mouth at bedtime.  . traMADol (ULTRAM) 50 MG tablet Take 2 tablets (100 mg total) by mouth at bedtime.  . [DISCONTINUED] guaiFENesin (MUCINEX) 600 MG 12 hr tablet Take 600 mg by mouth 2 (two) times daily.   No facility-administered encounter medications on file as of 02/16/2017.     Review of Systems   This is very limited secondary to dementia nursing staff does not report any acute issues-she has gained weight this is thought to be appetite related is pleasant with exam today  Immunization History  Administered Date(s) Administered  . Influenza,inj,Quad PF,36+ Mos 05/04/2015  . Influenza-Unspecified 05/28/2016  . Pneumococcal-Unspecified 06/10/2016   Pertinent  Health Maintenance Due  Topic Date Due  . DEXA SCAN  11/30/1988  . INFLUENZA VACCINE  03/30/2017  . PNA vac Low Risk Adult (2 of 2 - PCV13) 06/10/2017   No flowsheet data found. Functional Status Survey:    Vitals:   02/16/17 1516  BP: 118/72  Pulse: 80  Resp: 17  Weight is 133.6  Physical Exam Gen. this is a pleasant cooperative elderly female she is confused but cooperative with simple verbal requests.  Skin is warm and dry.  Oropharynx is clear mucous membranes moist.  Chest is poor respiratory effort but could not really appreciate overt congestion there is no labored breathing  Heart is regular rate and rhythm without murmur gallop or rub she appears to have her baseline lower extremity edema bilaterally.  Abdomen soft nontender with positive bowel sounds.  Neurologic does have some stiffness of her right upper and lower  extremities which is not new-she also has a resting tremor at times although not very prominent this afternoon.  .   Labs reviewed:  Recent Labs  10/29/16 0700 12/27/16 0900 01/25/17 0715  NA 135 138 139  K 4.2 3.7 3.5  CL 101 103 105  CO2 27 28 26   GLUCOSE 87 116* 99  BUN 17 25* 36*  CREATININE 0.61 0.66 0.86  CALCIUM 8.8* 9.0 8.8*    Recent Labs  09/01/16 1700 12/27/16 0900 01/25/17 0715  AST 18 20 19   ALT 12* 11* 11*  ALKPHOS 47 47 48  BILITOT 0.2* 0.2* 0.5  PROT 7.2 7.2 7.2  ALBUMIN 2.8* 3.0* 3.0*    Recent Labs  10/29/16 0700 12/27/16 0900 01/25/17 0715  WBC 4.4 4.2 5.4  NEUTROABS 2.2 2.3 3.2  HGB 10.8* 10.5* 9.9*  HCT 33.4* 32.5* 31.3*  MCV 88.4 88.8 88.9  PLT 284 300 262   Lab Results  Component Value Date   TSH 1.813 10/29/2016  No results found for: HGBA1C Lab Results  Component Value Date   CHOL 118 12/27/2016   HDL 47 12/27/2016   LDLCALC 53 12/27/2016   TRIG 92 12/27/2016   CHOLHDL 2.5 12/27/2016    Significant Diagnostic Results in last 30 days:  No results found.  Assessment/Plan  1 dementia with behaviors-again she is largely pleasant and cooperative and does well with supportive care she is actually gaining weight-apparently she does have some nighttime restlessness however she does receive tramadol at night would be hesitant to start a benzodiazepine-will start with melatonin 3 mg daily at bedtime when necessary and monitor   [place], [place], [place]

## 2017-03-01 ENCOUNTER — Encounter: Payer: Self-pay | Admitting: Internal Medicine

## 2017-03-01 ENCOUNTER — Non-Acute Institutional Stay (SKILLED_NURSING_FACILITY): Payer: Medicare Other | Admitting: Internal Medicine

## 2017-03-01 DIAGNOSIS — E039 Hypothyroidism, unspecified: Secondary | ICD-10-CM

## 2017-03-01 DIAGNOSIS — E559 Vitamin D deficiency, unspecified: Secondary | ICD-10-CM | POA: Diagnosis not present

## 2017-03-01 DIAGNOSIS — E785 Hyperlipidemia, unspecified: Secondary | ICD-10-CM | POA: Diagnosis not present

## 2017-03-01 DIAGNOSIS — I10 Essential (primary) hypertension: Secondary | ICD-10-CM | POA: Diagnosis not present

## 2017-03-01 DIAGNOSIS — R059 Cough, unspecified: Secondary | ICD-10-CM

## 2017-03-01 DIAGNOSIS — G309 Alzheimer's disease, unspecified: Secondary | ICD-10-CM | POA: Diagnosis not present

## 2017-03-01 DIAGNOSIS — D509 Iron deficiency anemia, unspecified: Secondary | ICD-10-CM | POA: Diagnosis not present

## 2017-03-01 DIAGNOSIS — F0281 Dementia in other diseases classified elsewhere with behavioral disturbance: Secondary | ICD-10-CM | POA: Diagnosis not present

## 2017-03-01 DIAGNOSIS — R05 Cough: Secondary | ICD-10-CM

## 2017-03-01 NOTE — Progress Notes (Signed)
Location:   [facility] Room Number: 157/W Place of Service:  SNF (31) Provider:  [name], MD  Patient Care Team: [name], MD as PCP - General (Internal Medicine)  Extended Emergency Contact Information Primary Emergency Contact: [name] Address: [address]          [place],  [place] Home Phone: [phone] Relation: None Secondary Emergency Contact: [place], [place] [address] [name] of [place] Phone: [phone] Relation: Grandson  Code Status:  DNR Goals of care: Advanced Directive information Advanced Directives 03/01/2017  Does Patient Have a Medical Advance Directive? Yes  Type of Advance Directive Out of facility DNR (pink MOST or yellow form)  Does patient want to make changes to medical advance directive? No - Patient declined  Copy of [place] in Chart? -  Would patient like information on creating a medical advance directive? -  Pre-existing out of facility DNR order (yellow form or pink MOST form) -     Chief Complaint  Patient presents with  . Medical Management of Chronic Issues    Routine Visit  . Acute Visit    Cough and Congestion  Routine visit is for medical management of chronic medical issues including dementia-hypertension hypothyroidism anemia hyperlipidemia as well as history of pneumonia.  Acute visit for family concerns of patient having some cough and congestion.  HPI:  Pt is a 83 y.o. female seen today for medical management of chronic diseases. As noted above she has been relatively stable in regards to this-she does have a history at times of cough congestion runny nose and was most recently treated for pneumonia with Flonase Mucinex and Levaquin she appeared to benefit from this and follow-up chest x-ray showed essentially resolution of pneumonia.  However she's developed some increased cough and congestion in family would like  me to take a look at her-she herself denies any issues shortness of breath but she tends to deny any abnormalities.  In regards to her all her medical issues these appear relatively stable she does have a history of dementia weight appears to be relatively stable she eats quite well with supportive care from her family which assist her daily in visit often.  She also has a history of hypertension she is on Cozaar and clonidine this appears relatively stable systolics however around 993.  She also has a history of anemia which has been stable at around 9.9 we will recheck this.  Currently see is sitting in her chair comfortably-family states she did not eat as well today as she usually does  Of note she was also recently started on melatonin for concerns that she was not sleeping well at night apparently this has improved.  .She initially came here after hospitalization for a biliary obstructions she is status post ERCP--sphinectomy--this has really been stable during her stay here liver function tests have normalized     Past Medical History:  Diagnosis Date  . Anemia   . Arthritis   . Bradycardia   . Cancer [facility])    had breast cancer 8 yrs ago  . 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 GERD (gastroesophageal reflux disease)   . HCAP (healthcare-associated pneumonia)   . Hypertension   . Hypokalemia   . Hypomagnesemia   . Osteoarthritis   . Pancreatitis, acute 05/03/2015  . Thyroid disease   . Urinary tract  infection 02/07/16   Coag negative staph. Nitrofurantoin prescribed   . UTI (lower urinary tract infection) 04/03/2016   Proteus treated with Septra DS   Past Surgical History:  Procedure Laterality Date  . BREAST SURGERY    . ERCP N/A 01/22/2016   Procedure: ENDOSCOPIC RETROGRADE CHOLANGIOPANCREATOGRAPHY (ERCP) With biliary sphincterotomy and stone extraction ;  Surgeon: Rogene Houston, MD;  Location: [facility] ORS;  Service: Endoscopy;  Laterality: N/A;  .  ESOPHAGOGASTRODUODENOSCOPY N/A 05/07/2015   Procedure: ESOPHAGOGASTRODUODENOSCOPY (EGD);  Surgeon: Rogene Houston, MD;  Location: [facility] ENDO SUITE;  Service: Endoscopy;  Laterality: N/A;    Allergies  Allergen Reactions  . Codeine Nausea And Vomiting  . Nsaids     Outpatient Encounter Prescriptions as of 03/01/2017  Medication Sig  . acetaminophen (TYLENOL) 325 MG tablet Take 650 mg by mouth every 6 (six) hours as needed.  Roseanne Kaufman Peru-Castor Oil (VENELEX) OINT Apply to sacrum and bilateral buttock every shift and as needed for prevention  . Cholecalciferol 1000 units TBDP Take 1 tablet by mouth daily  . cloNIDine (CATAPRES) 0.2 MG tablet Take 0.2 mg by mouth at bedtime.  . docusate sodium (COLACE) 100 MG capsule Take 100 mg by mouth every morning.  . ferrous sulfate (FERROUSUL) 325 (65 FE) MG tablet Take 325 mg by mouth daily with breakfast.  . levothyroxine (SYNTHROID, LEVOTHROID) 175 MCG tablet Take 175 mcg by mouth daily before breakfast.  . losartan (COZAAR) 50 MG tablet Take 50 mg by mouth daily.  . Melatonin 3 MG TABS Take 3 mg by mouth at bedtime as needed.  . Multiple Vitamins-Minerals (MULTIVITAMIN WITH MINERALS) tablet Take 1 tablet by mouth daily.  Marland Kitchen omeprazole (PRILOSEC) 20 MG capsule Take 20 mg by mouth daily.  . polyvinyl alcohol (LIQUIFILM TEARS) 1.4 % ophthalmic solution Place 1 drop into both eyes every 4 (four) hours.   . simvastatin (ZOCOR) 10 MG tablet Take 10 mg by mouth at bedtime.  . traMADol (ULTRAM) 50 MG tablet Take 2 tablets (100 mg total) by mouth at bedtime.   No facility-administered encounter medications on file as of 03/01/2017.      Review of Systems This is very limited secondary to dementia please see history of present illness she does not really have any complaints today of family has noted some increased cough and congestion and not eating as well today-- Her family also feels that she's had some dysuria accompanied with foul-smelling  urine Immunization History  Administered Date(s) Administered  . Influenza,inj,Quad PF,36+ Mos 05/04/2015  . Influenza-Unspecified 05/28/2016  . Pneumococcal-Unspecified 06/10/2016   Pertinent  Health Maintenance Due  Topic Date Due  . DEXA SCAN  11/30/1988  . INFLUENZA VACCINE  03/30/2017  . PNA vac Low Risk Adult (2 of 2 - PCV13) 06/10/2017   No flowsheet data found. Functional Status Survey:    Vitals:   03/01/17 1451  BP: 140/84  Pulse: 86  Resp: (!) 22  Temp: 98.4 F (36.9 C)  TempSrc: Oral  Weight: 137 lb 12.8 oz (62.5 kg)  Height: 5\' 4"  (1.626 m)   Body mass index is 23.65 kg/m. Physical Exam \\In  general this is a frail elderly female she is alert and sitting comfortably in her wheelchair   Her skin is warm and dry.She is not diaphoretic -      Oropharynx mucous membranes appear moist.  Eyes does have some clear drainage from her eyes bilaterally visual acuity appears grossly intact--drainage appears to be clear   Her heart  is regular rate and rhythm with a slight murmur she has mild lower extremity edema which appears relatively baseline  Her chest --there is has some minimal bronchial sounds more so anterior fields no labored breathing there is shallow air entry.  She appears to have chronic lower extremities edema family thinks this has increased recently  Her abdomen is soft does not appear to be tender --bowel sounds are positive.  Musculoskeletal does has general weakness lower extremities which is baselicontinues with diffuse arthriticchanges also has upper extremity tremors which are not new        Neurologic could not really appreciate lateralizing findings or changes from baseline cranial nerves are grossly intact.-Continued with the intermittent upper extremity tremors  Psych she does have significant dementia she is pleasant smiling follow simple verbal commands without difficulty Labs reviewed:  Recent  Labs  10/29/16 0700 12/27/16 0900 01/25/17 0715  NA 135 138 139  K 4.2 3.7 3.5  CL 101 103 105  CO2 27 28 26   GLUCOSE 87 116* 99  BUN 17 25* 36*  CREATININE 0.61 0.66 0.86  CALCIUM 8.8* 9.0 8.8*    Recent Labs  09/01/16 1700 12/27/16 0900 01/25/17 0715  AST 18 20 19   ALT 12* 11* 11*  ALKPHOS 47 47 48  BILITOT 0.2* 0.2* 0.5  PROT 7.2 7.2 7.2  ALBUMIN 2.8* 3.0* 3.0*    Recent Labs  10/29/16 0700 12/27/16 0900 01/25/17 0715  WBC 4.4 4.2 5.4  NEUTROABS 2.2 2.3 3.2  HGB 10.8* 10.5* 9.9*  HCT 33.4* 32.5* 31.3*  MCV 88.4 88.8 88.9  PLT 284 300 262   Lab Results  Component Value Date   TSH 1.813 10/29/2016   No results found for: HGBA1C Lab Results  Component Value Date   CHOL 118 12/27/2016   HDL 47 12/27/2016   LDLCALC 53 12/27/2016   TRIG 92 12/27/2016   CHOLHDL 2.5 12/27/2016    Significant Diagnostic Results in last 30 days:  No results found.  Assessment/Plan  #1-history of congestion with pneumonia-clinically she appears stable but she does have a significant history here will restart Flonase 1 spray nostril twice a day for 7 days-Mucinex 600 mg twice a day for 7 days-she does have duo nebs every 6 hours when necessary he should be encouraged.  Also will update EKG CBC with differential metabolic panel and BNP tomorrow.  Will obtain a chest x-ray 2 view in addition to monitoring her vital signs pulse ox every shift for 72 hours.  #2 question dysuria will obtain a UA C and S  #3 dementia this appears stable with supportive care she has very supportive family as well weight appears to have been relatively stabilized.  #3 hypothyroidism she is on Synthroid TSH was within normal limits in March will have this updated TSH in March was 1.813.  #4 hypertension this appears relatively stable and got 140/84 manually today-somewhat loose control desired secondary to her advanced age she is on Cozaar and clonidine.  #5-history of anemia this is been stable  suspect there is an element of chronic disease last hemoglobin was 9.9 will update this as well she is on iron.  #6 history of hyperlipidemia she is on Zocor LDL was 53 on April 2018 lab.   #7 history of chronic pain thought to be mostly osteoarthritic she receives tramadol at night-also has when necessary Tylenol is not really complaining of pain today.  Number a8-history of insomnia melatonin has been started there are no complaints of  insomnia today.  #9-history GERD this is been stable on a PPI Prilosec.  #10 history of vitamin D deficiency she is on supplementation vitamin D level was 48.1 on lab done in May.  #11-question increased lower extremity edema family feel she's had some mild increase here again will update a BNP tomorrow-her weight appears to be relatively stable  [id number]-of note greater than 40 minutes spent assessing patient-discussing family's concerns with family at [place] her chart-reviewing her labs-and coordinating and formulating a plan of care for numerous diagnoses-of note greater than 50% of time spent coordinating plan of care

## 2017-03-02 ENCOUNTER — Encounter (HOSPITAL_COMMUNITY)
Admission: AD | Admit: 2017-03-02 | Discharge: 2017-03-02 | Disposition: A | Discharge status: Home / Self Care | Payer: Medicare Other | Source: Skilled Nursing Facility | Attending: Internal Medicine | Admitting: Internal Medicine

## 2017-03-02 DIAGNOSIS — Z48815 Encounter for surgical aftercare following surgery on the digestive system: Secondary | ICD-10-CM | POA: Insufficient documentation

## 2017-03-02 DIAGNOSIS — E785 Hyperlipidemia, unspecified: Secondary | ICD-10-CM | POA: Insufficient documentation

## 2017-03-02 DIAGNOSIS — N390 Urinary tract infection, site not specified: Secondary | ICD-10-CM | POA: Diagnosis not present

## 2017-03-02 DIAGNOSIS — D509 Iron deficiency anemia, unspecified: Secondary | ICD-10-CM | POA: Insufficient documentation

## 2017-03-02 DIAGNOSIS — I10 Essential (primary) hypertension: Secondary | ICD-10-CM | POA: Insufficient documentation

## 2017-03-02 DIAGNOSIS — E039 Hypothyroidism, unspecified: Secondary | ICD-10-CM | POA: Diagnosis not present

## 2017-03-02 DIAGNOSIS — R262 Difficulty in walking, not elsewhere classified: Secondary | ICD-10-CM | POA: Insufficient documentation

## 2017-03-02 DIAGNOSIS — R17 Unspecified jaundice: Secondary | ICD-10-CM | POA: Diagnosis not present

## 2017-03-02 LAB — CBC WITH DIFFERENTIAL/PLATELET
BASOS ABS: 0 10*3/uL (ref 0.0–0.1)
BASOS PCT: 1 %
EOS ABS: 0.3 10*3/uL (ref 0.0–0.7)
Eosinophils Relative: 5 %
HCT: 31 % — ABNORMAL LOW (ref 36.0–46.0)
Hemoglobin: 10.1 g/dL — ABNORMAL LOW (ref 12.0–15.0)
Lymphocytes Relative: 36 %
Lymphs Abs: 1.7 10*3/uL (ref 0.7–4.0)
MCH: 28.5 pg (ref 26.0–34.0)
MCHC: 32.6 g/dL (ref 30.0–36.0)
MCV: 87.6 fL (ref 78.0–100.0)
MONO ABS: 0.9 10*3/uL (ref 0.1–1.0)
MONOS PCT: 18 %
NEUTROS ABS: 1.9 10*3/uL (ref 1.7–7.7)
Neutrophils Relative %: 40 %
PLATELETS: 268 10*3/uL (ref 150–400)
RBC: 3.54 MIL/uL — ABNORMAL LOW (ref 3.87–5.11)
RDW: 14.2 % (ref 11.5–15.5)
WBC: 4.9 10*3/uL (ref 4.0–10.5)

## 2017-03-02 LAB — BASIC METABOLIC PANEL
ANION GAP: 10 (ref 5–15)
BUN: 25 mg/dL — ABNORMAL HIGH (ref 6–20)
CALCIUM: 8.5 mg/dL — AB (ref 8.9–10.3)
CO2: 25 mmol/L (ref 22–32)
CREATININE: 0.79 mg/dL (ref 0.44–1.00)
Chloride: 101 mmol/L (ref 101–111)
Glucose, Bld: 95 mg/dL (ref 65–99)
Potassium: 3.9 mmol/L (ref 3.5–5.1)
SODIUM: 136 mmol/L (ref 135–145)

## 2017-03-02 LAB — URINALYSIS, ROUTINE W REFLEX MICROSCOPIC
Bilirubin Urine: NEGATIVE
GLUCOSE, UA: NEGATIVE mg/dL
Ketones, ur: NEGATIVE mg/dL
NITRITE: POSITIVE — AB
PROTEIN: NEGATIVE mg/dL
SPECIFIC GRAVITY, URINE: 1.013 (ref 1.005–1.030)
pH: 6 (ref 5.0–8.0)

## 2017-03-02 LAB — BRAIN NATRIURETIC PEPTIDE: B NATRIURETIC PEPTIDE 5: 178 pg/mL — AB (ref 0.0–100.0)

## 2017-03-04 ENCOUNTER — Non-Acute Institutional Stay (SKILLED_NURSING_FACILITY): Payer: Medicare Other | Admitting: Internal Medicine

## 2017-03-04 ENCOUNTER — Encounter: Payer: Self-pay | Admitting: Internal Medicine

## 2017-03-04 DIAGNOSIS — R059 Cough, unspecified: Secondary | ICD-10-CM

## 2017-03-04 DIAGNOSIS — Z8701 Personal history of pneumonia (recurrent): Secondary | ICD-10-CM | POA: Diagnosis not present

## 2017-03-04 DIAGNOSIS — R05 Cough: Secondary | ICD-10-CM

## 2017-03-04 LAB — URINE CULTURE

## 2017-03-04 NOTE — Progress Notes (Signed)
Location:   [facility] Room Number: 157/W Place of Service:  SNF [phone]) Provider:  [name], MD  Patient Care Team: [name], MD as PCP - General (Internal Medicine)  Extended Emergency Contact Information Primary Emergency Contact: [name] Address: [address]          [place],  [place] Home Phone: [phone] Relation: None Secondary Emergency Contact: [place], [place] [address] [name] of [place] Phone: [phone] Relation: Grandson  Code Status:  DNR Goals of care: Advanced Directive information Advanced Directives 03/04/2017  Does Patient Have a Medical Advance Directive? Yes  Type of Advance Directive Out of facility DNR (pink MOST or yellow form)  Does patient want to make changes to medical advance directive? No - Patient declined  Copy of [place] in Chart? -  Would patient like information on creating a medical advance directive? -  Pre-existing out of facility DNR order (yellow form or pink MOST form) -     Chief Complaint  Patient presents with  . Acute Visit    F/U Respiratory  Follow up respiratory issues  HPI:  Pt is a 83 y.o. female seen today for an acute visit for follow-up of respiratory concerns earlier this week.  Patient does have a history of pneumonia and apparently had some increased cough and nasal drainage I drainage earlier this week-she was started on Mucinex for a seven-day course as well as Flonase for 7 days she is also on DuoNeb nebulizers every 6 hours when necessary-chest r x-ray has come back negative for any acute process and she is actually doing well today her appetite is improved she appears to be relatively at her baseline she is still completing a course of Mucinex as well as the Flonase  Vital signs are stable as well as O2 saturations are in the 90s on room air   Past Medical History:  Diagnosis Date  . Anemia   .  Arthritis   . Bradycardia   . Cancer [facility])    had breast cancer 8 yrs ago  . 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 GERD (gastroesophageal reflux disease)   . HCAP (healthcare-associated pneumonia)   . Hypertension   . Hypokalemia   . Hypomagnesemia   . Osteoarthritis   . Pancreatitis, acute 05/03/2015  . Thyroid disease   . Urinary tract infection 02/07/16   Coag negative staph. Nitrofurantoin prescribed   . UTI (lower urinary tract infection) 04/03/2016   Proteus treated with Septra DS   Past Surgical History:  Procedure Laterality Date  . BREAST SURGERY    . ERCP N/A 01/22/2016   Procedure: ENDOSCOPIC RETROGRADE CHOLANGIOPANCREATOGRAPHY (ERCP) With biliary sphincterotomy and stone extraction ;  Surgeon: Rogene Houston, MD;  Location: [facility] ORS;  Service: Endoscopy;  Laterality: N/A;  . ESOPHAGOGASTRODUODENOSCOPY N/A 05/07/2015   Procedure: ESOPHAGOGASTRODUODENOSCOPY (EGD);  Surgeon: Rogene Houston, MD;  Location: [facility] ENDO SUITE;  Service: Endoscopy;  Laterality: N/A;    Allergies  Allergen Reactions  . Codeine Nausea And Vomiting  . Nsaids     Outpatient Encounter Prescriptions as of 03/04/2017  Medication Sig  . acetaminophen (TYLENOL) 325 MG tablet Take 650 mg by mouth every 6 (six) hours as needed.  Roseanne Kaufman Peru-Castor Oil (VENELEX) OINT Apply to sacrum and bilateral buttock every shift and as needed for prevention  . Cholecalciferol 1000  units TBDP Take 1 tablet by mouth daily  . cloNIDine (CATAPRES) 0.2 MG tablet Take 0.2 mg by mouth at bedtime.  . docusate sodium (COLACE) 100 MG capsule Take 100 mg by mouth every morning.  . ferrous sulfate (FERROUSUL) 325 (65 FE) MG tablet Take 325 mg by mouth daily with breakfast.  . fluticasone (FLONASE) 50 MCG/ACT nasal spray Place 1 spray into both nostrils 2 (two) times daily.  Marland Kitchen guaiFENesin (MUCINEX) 600 MG 12 hr tablet Take 600 mg by mouth 2 (two) times daily.  Marland Kitchen ipratropium-albuterol (DUONEB) 0.5-2.5 (3) MG/3ML  SOLN Take 3 mLs by nebulization every 6 (six) hours as needed.  Marland Kitchen levothyroxine (SYNTHROID, LEVOTHROID) 175 MCG tablet Take 175 mcg by mouth daily before breakfast.  . losartan (COZAAR) 50 MG tablet Take 50 mg by mouth daily.  . Multiple Vitamins-Minerals (MULTIVITAMIN WITH MINERALS) tablet Take 1 tablet by mouth daily.  Marland Kitchen omeprazole (PRILOSEC) 20 MG capsule Take 20 mg by mouth daily.  . polyvinyl alcohol (LIQUIFILM TEARS) 1.4 % ophthalmic solution Place 1 drop into both eyes every 4 (four) hours.   . simvastatin (ZOCOR) 10 MG tablet Take 10 mg by mouth at bedtime.  . traMADol (ULTRAM) 50 MG tablet Take 2 tablets (100 mg total) by mouth at bedtime.  . [DISCONTINUED] Melatonin 3 MG TABS Take 3 mg by mouth at bedtime as needed.   No facility-administered encounter medications on file as of 03/04/2017.     Review of Systems   Limited secondary to dementia please see history of present illness apparently cough and congestion has improved as well as the nasal one  drainage-she is eating somewhat better per family  Immunization History  Administered Date(s) Administered  . Influenza,inj,Quad PF,36+ Mos 05/04/2015  . Influenza-Unspecified 05/28/2016  . Pneumococcal-Unspecified 06/10/2016   Pertinent  Health Maintenance Due  Topic Date Due  . DEXA SCAN  11/30/1988  . INFLUENZA VACCINE  03/30/2017  . PNA vac Low Risk Adult (2 of 2 - PCV13) 06/10/2017   No flowsheet data found. Functional Status Survey:    Vitals:   03/04/17 1605  BP: 103/70  Pulse: 80  Resp: 20  Temp: 98.7 F (37.1 C)  TempSrc: Oral    Physical Exam \\In  general this is a frail elderly female she is alert and sitting comfortably in her wheelchair   Her skin is warm and dry.She is not diaphoretic -      Oropharynx mucous membranes appear moist.  Eyes visual acuity appears grossly intact I do not really note any eye drainage.  Nose again did not really see any drainage  Her heart is regular rate  and rhythm with a slight murmur she has mild lower extremity edema which appears relatively baseline   Chest is clear to auscultation with somewhat shallow air entry there is no labored breathing  Her abdomen is soft does not appear to be tender --bowel sounds are positive.  Musculoskeletal does has general weakness lower extremities which is baselicontinues with diffuse arthriticchanges also has upper extremity tremors which are not new        Neurologic could not really appreciate lateralizing findings or changes from baseline cranial nerves are grossly intact.-Continued with the intermittent upper extremity tremors  Psych she does have significant dementia she is pleasant smiling follow simple verbal commands without difficulty Labs reviewed:  Recent Labs  12/27/16 0900 01/25/17 0715 03/02/17 0430  NA 138 139 136  K 3.7 3.5 3.9  CL 103 105 101  CO2 28 26  25  GLUCOSE 116* 99 95  BUN 25* 36* 25*  CREATININE 0.66 0.86 0.79  CALCIUM 9.0 8.8* 8.5*    Recent Labs  09/01/16 1700 12/27/16 0900 01/25/17 0715  AST 18 20 19   ALT 12* 11* 11*  ALKPHOS 47 47 48  BILITOT 0.2* 0.2* 0.5  PROT 7.2 7.2 7.2  ALBUMIN 2.8* 3.0* 3.0*    Recent Labs  12/27/16 0900 01/25/17 0715 03/02/17 0430  WBC 4.2 5.4 4.9  NEUTROABS 2.3 3.2 1.9  HGB 10.5* 9.9* 10.1*  HCT 32.5* 31.3* 31.0*  MCV 88.8 88.9 87.6  PLT 300 262 268   Lab Results  Component Value Date   TSH 1.813 10/29/2016   No results found for: HGBA1C Lab Results  Component Value Date   CHOL 118 12/27/2016   HDL 47 12/27/2016   LDLCALC 53 12/27/2016   TRIG 92 12/27/2016   CHOLHDL 2.5 12/27/2016    Significant Diagnostic Results in last 30 days:  No results found.  Assessment/Plan  -#1-cough and congestion this appears to be improved she is finishing a course of Mucinex as well as Flonase also continues duo nebs as needed-this appears largely resolved at this point will monitor.  Chest x-ray did  not show any acute process.  [id number]

## 2017-03-06 DIAGNOSIS — M79641 Pain in right hand: Secondary | ICD-10-CM | POA: Diagnosis not present

## 2017-03-08 ENCOUNTER — Non-Acute Institutional Stay (SKILLED_NURSING_FACILITY): Payer: Medicare Other | Admitting: Internal Medicine

## 2017-03-08 DIAGNOSIS — R05 Cough: Secondary | ICD-10-CM

## 2017-03-08 DIAGNOSIS — M1120 Other chondrocalcinosis, unspecified site: Secondary | ICD-10-CM | POA: Diagnosis not present

## 2017-03-08 DIAGNOSIS — M79644 Pain in right finger(s): Secondary | ICD-10-CM

## 2017-03-08 DIAGNOSIS — R059 Cough, unspecified: Secondary | ICD-10-CM

## 2017-03-08 NOTE — Progress Notes (Signed)
This is an acute visit.  Level care skilled.  Facility is [facility].  Chief complaint-acute visit secondary to right third finger pain and edema.  History of present illness.  Patient is a pleasant 83 year old female with a history of dementia-hypertension and anemia and osteoarthritis among other issues.  Apparently over the weekend she complained of some right hand pain and an x-ray was obtained-results are not a very low to mid-this time but apparently were negative although will have to confirm this.  She continues to complain of pain especially over right third finger there is some swelling here and tenderness to palpation and slight warmth.  Apparently there's been no history of trauma-.  Patient is a poor historian secondary to dementia but she is not complaining of any other issues-he recently was treated for respiratory issues chest x-ray was negative for pneumonia-she appears to be doing well continues on Mucinex as well as when necessary nebulizers.  Past Medical History:  Diagnosis Date  . Anemia   . Arthritis   . Bradycardia   . Cancer [facility])    had breast cancer 8 yrs ago  . 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 GERD (gastroesophageal reflux disease)   . HCAP (healthcare-associated pneumonia)   . Hypertension   . Hypokalemia   . Hypomagnesemia   . Osteoarthritis   . Pancreatitis, acute 05/03/2015  . Thyroid disease   . Urinary tract infection 02/07/16   Coag negative staph. Nitrofurantoin prescribed   . UTI (lower urinary tract infection) 04/03/2016   Proteus treated with Septra DS        Past Surgical History:  Procedure Laterality Date  . BREAST SURGERY    . ERCP N/A 01/22/2016   Procedure: ENDOSCOPIC RETROGRADE CHOLANGIOPANCREATOGRAPHY (ERCP) With biliary sphincterotomy and stone extraction ;  Surgeon: Rogene Houston, MD;  Location: [facility];  Service: Endoscopy;  Laterality: N/A;  . ESOPHAGOGASTRODUODENOSCOPY  N/A 05/07/2015   Procedure: ESOPHAGOGASTRODUODENOSCOPY (EGD);  Surgeon: Rogene Houston, MD;  Location: [facility];  Service: Endoscopy;  Laterality: N/A;        Allergies  Allergen Reactions  . Codeine Nausea And Vomiting  . Nsaids         Outpatient Encounter Prescriptions as of 03/04/2017  Medication Sig  . acetaminophen (TYLENOL) 325 MG tablet Take 650 mg by mouth every 6 (six) hours as needed.  Roseanne Kaufman Peru-Castor Oil (VENELEX) OINT Apply to sacrum and bilateral buttock every shift and as needed for prevention  . Cholecalciferol 1000 units TBDP Take 1 tablet by mouth daily  . cloNIDine (CATAPRES) 0.2 MG tablet Take 0.2 mg by mouth at bedtime.  . docusate sodium (COLACE) 100 MG capsule Take 100 mg by mouth every morning.  . ferrous sulfate (FERROUSUL) 325 (65 FE) MG tablet Take 325 mg by mouth daily with breakfast.  . fluticasone (FLONASE) 50 MCG/ACT nasal spray Place 1 spray into both nostrils 2 (two) times daily.  Marland Kitchen guaiFENesin (MUCINEX) 600 MG 12 hr tablet Take 600 mg by mouth 2 (two) times daily.  Marland Kitchen ipratropium-albuterol (DUONEB) 0.5-2.5 (3) MG/3ML SOLN Take 3 mLs by nebulization every 6 (six) hours as needed.  Marland Kitchen levothyroxine (SYNTHROID, LEVOTHROID) 175 MCG tablet Take 175 mcg by mouth daily before breakfast.  . losartan (COZAAR) 50 MG tablet Take 50 mg by mouth daily.  . Multiple Vitamins-Minerals (MULTIVITAMIN WITH MINERALS) tablet Take 1 tablet by mouth daily.  Marland Kitchen omeprazole (PRILOSEC) 20 MG capsule Take 20 mg by  mouth daily.  . polyvinyl alcohol (LIQUIFILM TEARS) 1.4 % ophthalmic solution Place 1 drop into both eyes every 4 (four) hours.   . simvastatin (ZOCOR) 10 MG tablet Take 10 mg by mouth at bedtime.  . traMADol (ULTRAM) 50 MG tablet Take 2 tablets (100 mg total) by mouth at bedtime.  . [DISCONTINUED] Melatonin 3 MG TABS Take 3 mg by mouth at bedtime as needed.        Review of Systems   Limited secondary to dementia please see history of present  illness        Immunization History  Administered Date(s) Administered  . Influenza,inj,Quad PF,36+ Mos 05/04/2015  . Influenza-Unspecified 05/28/2016  . Pneumococcal-Unspecified 06/10/2016       Pertinent  Health Maintenance Due  Topic Date Due  . DEXA SCAN  11/30/1988  . INFLUENZA VACCINE  03/30/2017  . PNA vac Low Risk Adult (2 of 2 - PCV13) 06/10/2017   No flowsheet data found. Functional Status Survey:     Vitals:                      T- 97.8 pulse 72 respirations 18 blood pressure 132/79 Physical Exam \\In  general this is a frail elderly female she is alert Lying in bed  Her skin is warm and dry.She is not diaphoretic -      Oropharynx mucous membranes appear moist.  Eyes visual acuity appears grossly intact I do not really note any eye drainage.     Her heart is regular rate and rhythm with a slight murmur she has mild--moderate lower extremity edema which appears relatively baseline--she has just been put to bed and  has had her legs in a dependent  position    Chest is clear to auscultation with somewhat shallow air entry there is no labored breathing  Her abdomen is soft does not appear to be tender --bowel sounds are positive.  Musculoskeletal does has general weakness lower extremities   -Does have arthritic changes of hands bilaterally-on the right hand third finger there is some mild swelling and fairly acute tenderness to palpation contracture present-tenderness appears to be more the third finger and the knuckle  she winces when touched         Neurologic could not really appreciate lateralizing findings or changes from baseline cranial nerves are grossly intact.-Continued with the intermittent upper extremity tremors  Psych she does have significant dementia she is pleasant smiling follow simple verbal commands without difficulty Labs reviewed:  RecentLabs(withinlast365days)   Recent Labs   12/27/16 0900 01/25/17 0715 03/02/17 0430  NA 138 139 136  K 3.7 3.5 3.9  CL 103 105 101  CO2 28 26 25   GLUCOSE 116* 99 95  BUN 25* 36* 25*  CREATININE 0.66 0.86 0.79  CALCIUM 9.0 8.8* 8.5*      RecentLabs(withinlast365days)   Recent Labs  09/01/16 1700 12/27/16 0900 01/25/17 0715  AST 18 20 19   ALT 12* 11* 11*  ALKPHOS 47 47 48  BILITOT 0.2* 0.2* 0.5  PROT 7.2 7.2 7.2  ALBUMIN 2.8* 3.0* 3.0*      RecentLabs(withinlast365days)   Recent Labs  12/27/16 0900 01/25/17 0715 03/02/17 0430  WBC 4.2 5.4 4.9  NEUTROABS 2.3 3.2 1.9  HGB 10.5* 9.9* 10.1*  HCT 32.5* 31.3* 31.0*  MCV 88.8 88.9 87.6  PLT 300 262 268     RecentLabs       Lab Results  Component Value Date   TSH 1.813 10/29/2016  RecentLabs  No results found for: HGBA1C   RecentLabs       Lab Results  Component Value Date   CHOL 118 12/27/2016   HDL 47 12/27/2016   LDLCALC 53 12/27/2016   TRIG 92 12/27/2016   CHOLHDL 2.5 12/27/2016      Significant Diagnostic Results in last 30 days:  ImagingResults    Assessment and plan.  #1 right hand especially right third finger discomfort some mild swelling--- new problem--one would be concerned possibly about gout versus pseudogout-will order a uric acid level and rheumatoid factor-also a CBC with differential tomorrow to see if there is an elevated white count.  Will start low-dose prednisone 10 mg a day for 5 days to see if this will give some relief.  Also obtain x-r  results done over the weekend it is my understanding x-ray did not show any acute process but would like to confirm this    #2-recent respiratory issues--cough--will obtain as noted above this appears to have stabilized is on Mucinex and when necessary nebulizers most recent x-ray did not really show process-I do not note any cough  or complaints of congestion this evening  [phone]

## 2017-03-09 ENCOUNTER — Encounter (HOSPITAL_COMMUNITY)
Admission: RE | Admit: 2017-03-09 | Discharge: 2017-03-09 | Disposition: A | Discharge status: Home / Self Care | Payer: Medicare Other | Source: Skilled Nursing Facility | Attending: *Deleted | Admitting: *Deleted

## 2017-03-09 ENCOUNTER — Non-Acute Institutional Stay (SKILLED_NURSING_FACILITY): Payer: Medicare Other | Admitting: Internal Medicine

## 2017-03-09 DIAGNOSIS — Z48815 Encounter for surgical aftercare following surgery on the digestive system: Secondary | ICD-10-CM | POA: Diagnosis not present

## 2017-03-09 DIAGNOSIS — N390 Urinary tract infection, site not specified: Secondary | ICD-10-CM | POA: Diagnosis not present

## 2017-03-09 DIAGNOSIS — M1120 Other chondrocalcinosis, unspecified site: Secondary | ICD-10-CM

## 2017-03-09 DIAGNOSIS — D509 Iron deficiency anemia, unspecified: Secondary | ICD-10-CM | POA: Diagnosis not present

## 2017-03-09 DIAGNOSIS — E785 Hyperlipidemia, unspecified: Secondary | ICD-10-CM | POA: Diagnosis not present

## 2017-03-09 DIAGNOSIS — I10 Essential (primary) hypertension: Secondary | ICD-10-CM | POA: Diagnosis not present

## 2017-03-09 DIAGNOSIS — M79644 Pain in right finger(s): Secondary | ICD-10-CM

## 2017-03-09 DIAGNOSIS — E039 Hypothyroidism, unspecified: Secondary | ICD-10-CM | POA: Diagnosis not present

## 2017-03-09 LAB — CBC WITH DIFFERENTIAL/PLATELET
BASOS ABS: 0.1 10*3/uL (ref 0.0–0.1)
Basophils Relative: 1 %
EOS PCT: 5 %
Eosinophils Absolute: 0.3 10*3/uL (ref 0.0–0.7)
HEMATOCRIT: 30.9 % — AB (ref 36.0–46.0)
Hemoglobin: 10 g/dL — ABNORMAL LOW (ref 12.0–15.0)
LYMPHS ABS: 1.4 10*3/uL (ref 0.7–4.0)
LYMPHS PCT: 28 %
MCH: 28.6 pg (ref 26.0–34.0)
MCHC: 32.4 g/dL (ref 30.0–36.0)
MCV: 88.3 fL (ref 78.0–100.0)
MONO ABS: 0.8 10*3/uL (ref 0.1–1.0)
Monocytes Relative: 15 %
NEUTROS ABS: 2.5 10*3/uL (ref 1.7–7.7)
Neutrophils Relative %: 51 %
PLATELETS: 307 10*3/uL (ref 150–400)
RBC: 3.5 MIL/uL — AB (ref 3.87–5.11)
RDW: 14.2 % (ref 11.5–15.5)
WBC: 5 10*3/uL (ref 4.0–10.5)

## 2017-03-09 LAB — URIC ACID: Uric Acid, Serum: 5.5 mg/dL (ref 2.3–6.6)

## 2017-03-09 NOTE — Progress Notes (Signed)
This is an acute visit.  Level care skilled.  Facility is [facility].  Chief complaint-acute visit follow-up right hand pain.  History of present illness.  Patient is a pleasant 83 year old female with a history of dementia-hypertension anemia and osteoarthritis.  Apparently over the weekend she complained of some right hand pain x-ray was obtained which did not really show any acute changes.  I saw her yesterday for continued pain she did appear to have some swelling and tenderness to palpation over her right third and fourth fingers.  Lab work was ordered including--a CBC which showed a normal white count.  Also uric acid was done which was relatively unremarkable at 5.5  We did start her on low-dose prednisone for 5 days 10 mg a day.--For concerns of possible pseudogout  According to nursing and daughter apparently there's been some improvement today although she still has some discomfort here-.  Vital signs are stable she is afebrile-she appears to be at her baseline  Past Medical History:  Diagnosis Date  . Anemia   . Arthritis   . Bradycardia   . Cancer [facility])    had breast cancer 8 yrs ago  . 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 GERD (gastroesophageal reflux disease)   . HCAP (healthcare-associated pneumonia)   . Hypertension   . Hypokalemia   . Hypomagnesemia   . Osteoarthritis   . Pancreatitis, acute 05/03/2015  . Thyroid disease   . Urinary tract infection 02/07/16   Coag negative staph. Nitrofurantoin prescribed   . UTI (lower urinary tract infection) 04/03/2016   Proteus treated with Septra DS        Past Surgical History:  Procedure Laterality Date  . BREAST SURGERY    . ERCP N/A 01/22/2016   Procedure: ENDOSCOPIC RETROGRADE CHOLANGIOPANCREATOGRAPHY (ERCP) With biliary sphincterotomy and stone extraction ; Surgeon: Rogene Houston, MD; Location: [facility]; Service: Endoscopy; Laterality: N/A;  .  ESOPHAGOGASTRODUODENOSCOPY N/A 05/07/2015   Procedure: ESOPHAGOGASTRODUODENOSCOPY (EGD); Surgeon: Rogene Houston, MD; Location: [facility]; Service: Endoscopy; Laterality: N/A;        Allergies  Allergen Reactions  . Codeine Nausea And Vomiting  . Nsaids             Sig  . acetaminophen (TYLENOL) 325 MG tablet Take 650 mg by mouth every 6 (six) hours as needed.  Roseanne Kaufman Peru-Castor Oil (VENELEX) OINT Apply to sacrum and bilateral buttock every shift and as needed for prevention  . Cholecalciferol 1000 units TBDP Take 1 tablet by mouth daily  . cloNIDine (CATAPRES) 0.2 MG tablet Take 0.2 mg by mouth at bedtime.  . docusate sodium (COLACE) 100 MG capsule Take 100 mg by mouth every morning.  . ferrous sulfate (FERROUSUL) 325 (65 FE) MG tablet Take 325 mg by mouth daily with breakfast.  . fluticasone (FLONASE) 50 MCG/ACT nasal spray Place 1 spray into both nostrils 2 (two) times daily.  Marland Kitchen guaiFENesin (MUCINEX) 600 MG 12 hr tablet Take 600 mg by mouth 2 (two) times daily.  Marland Kitchen ipratropium-albuterol (DUONEB) 0.5-2.5 (3) MG/3ML SOLN Take 3 mLs by nebulization every 6 (six) hours as needed.  Marland Kitchen levothyroxine (SYNTHROID, LEVOTHROID) 175 MCG tablet Take 175 mcg by mouth daily before breakfast.  . losartan (COZAAR) 50 MG tablet Take 50 mg by mouth daily.  . Multiple Vitamins-Minerals (MULTIVITAMIN WITH MINERALS) tablet Take 1 tablet by mouth daily.  Marland Kitchen omeprazole (PRILOSEC) 20 MG capsule Take 20 mg by mouth daily.  . polyvinyl  alcohol (LIQUIFILM TEARS) 1.4 % ophthalmic solution Place 1 drop into both eyes every 4 (four) hours.   . simvastatin (ZOCOR) 10 MG tablet Take 10 mg by mouth at bedtime.  . traMADol (ULTRAM) 50 MG tablet Take 2 tablets (100 mg total) by mouth at bedtime.  . [DISCONTINUED] Melatonin 3 MG TABS Take 3 mg by mouth at bedtime as needed.      Of note she is on prednisone 10 mg daily 5 days   Review of Systems  Limited secondary to dementia please see  history of present illness        Immunization History  Administered Date(s) Administered  . Influenza,inj,Quad PF,36+ Mos 05/04/2015  . Influenza-Unspecified 05/28/2016  . Pneumococcal-Unspecified 06/10/2016       Pertinent Health Maintenance Due  Topic Date Due  . DEXA SCAN  11/30/1988  . INFLUENZA VACCINE  03/30/2017  . PNA vac Low Risk Adult (2 of 2 - PCV13) 06/10/2017   No flowsheet data found. Functional Status Survey:     Vitals:                       Temperature is 98.0 pulse 80 respirations 20 blood pressure 121/70 Physical Exam \\In  general this is a frail elderly female she is alert with no sign of discomfort sitting in her wheelchair  Her skin is warm and dry.She is not diaphoretic -      Oropharynx mucous membranes appear moist.  Eyes visual acuity appears grossly intact I do not really note any eye drainage.     Her heart is regular rate and rhythm with a slight murmur she has mild--moderate lower extremity edema which appears relatively baseline--she has just been put to bed and  has had her legs in a dependent  position    Chest is clear to auscultation with somewhat shallow air entry there is no labored breathing  Her abdomen is soft does not appear to be tender --bowel sounds are positive.  Musculoskeletal does has general weakness lower extremities   -Does have arthritic changes of hands bilaterally-on the right hand third finger there is some mild swelling and somederness to palpation contracture present on the fourth finger with tendernes-tenderness appears to be more the third finger and the fourth finger-however tenderness of the knuckle areas appears improved-winces more when the fingers are touched  In comparison to yesterday this appears to be somewhat less dramatic tenderness I do not really note any increased erythema still some slight warmth          Psych she does  have significant dementia she is pleasant smiling follow simple verbal commands without difficulty Labs reviewed:  03/09/2017.  Uric acid level 5.5.  WBC 5.0 hemoglobin 10.0 platelets 307  RecentLabs(withinlast365days)   Recent Labs  12/27/16 0900 01/25/17 0715 03/02/17 0430  NA 138 139 136  K 3.7 3.5 3.9  CL 103 105 101  CO2 28 26 25   GLUCOSE 116* 99 95  BUN 25* 36* 25*  CREATININE 0.66 0.86 0.79  CALCIUM 9.0 8.8* 8.5*      RecentLabs(withinlast365days)   Recent Labs  09/01/16 1700 12/27/16 0900 01/25/17 0715  AST 18 20 19   ALT 12* 11* 11*  ALKPHOS 47 47 48  BILITOT 0.2* 0.2* 0.5  PROT 7.2 7.2 7.2  ALBUMIN 2.8* 3.0* 3.0*      RecentLabs(withinlast365days)   Recent Labs  12/27/16 0900 01/25/17 0715 03/02/17 0430  WBC 4.2 5.4 4.9  NEUTROABS 2.3 3.2 1.9  HGB 10.5* 9.9* 10.1*  HCT 32.5* 31.3* 31.0*  MCV 88.8 88.9 87.6  PLT 300 262 268     RecentLabs       Lab Results  Component Value Date   TSH 1.813 10/29/2016     RecentLabs  No results found for: HGBA1C   RecentLabs       Lab Results  Component Value Date   CHOL 118 12/27/2016   HDL 47 12/27/2016   LDLCALC 53 12/27/2016   TRIG 92 12/27/2016   CHOLHDL 2.5 12/27/2016       Assessment and plan.  #1 Hand discomfort this appears to be gradually improving she has been started on prednisone for suspicions of pseudogout-uric acid level was within normal range as well as CBC no elevated white count.  At this point will monitor we did obtain the x-ray results done over the weekend which did not show any acute process.  If pain worsens or persists certainly will need to readdress.  [id number]

## 2017-03-22 ENCOUNTER — Encounter: Payer: Self-pay | Admitting: Internal Medicine

## 2017-03-22 ENCOUNTER — Non-Acute Institutional Stay (SKILLED_NURSING_FACILITY): Payer: Medicare Other | Admitting: Internal Medicine

## 2017-03-22 DIAGNOSIS — M810 Age-related osteoporosis without current pathological fracture: Secondary | ICD-10-CM | POA: Diagnosis not present

## 2017-03-22 DIAGNOSIS — K219 Gastro-esophageal reflux disease without esophagitis: Secondary | ICD-10-CM | POA: Diagnosis not present

## 2017-03-22 NOTE — Progress Notes (Signed)
Location:   [facility] Room Number: 157/W Place of Service:  SNF (31) Provider:  Geralyn Flash, MD  Patient Care Team: [name], MD as PCP - General (Internal Medicine)  Extended Emergency Contact Information Primary Emergency Contact: [name] Address: [address]          [place],  [place] Home Phone: [phone] Relation: None Secondary Emergency Contact: [place], [place] [address] [name] of [place] Phone: [phone] Relation: Grandson  Code Status:  DNR Goals of care: Advanced Directive information Advanced Directives 03/22/2017  Does Patient Have a Medical Advance Directive? Yes  Type of Advance Directive Out of facility DNR (pink MOST or yellow form)  Does patient want to make changes to medical advance directive? No - Patient declined  Copy of [place] in Chart? -  Would patient like information on creating a medical advance directive? -  Pre-existing out of facility DNR order (yellow form or pink MOST form) -     Chief Complaint  Patient presents with  . Acute Visit    Osteoporosis treatment    HPI:  Pt is a 83 y.o. female seen today for an acute visit for D/w the family for treatment of osteoporosis. Patient is doing well in facility. I had d/w the daughter with my last visit. She had agreed to do DEXA scan. Today I wanted to discuss results with the daughter before starting the treatment. Patient unable to give any history but no new issues per her daughter.   Past Medical History:  Diagnosis Date  . Anemia   . Arthritis   . Bradycardia   . Cancer [facility])    had breast cancer 8 yrs ago  . 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 GERD (gastroesophageal reflux disease)   . HCAP (healthcare-associated pneumonia)   . Hypertension   . Hypokalemia   . Hypomagnesemia   . Osteoarthritis   . Pancreatitis, acute 05/03/2015  . Thyroid  disease   . Urinary tract infection 02/07/16   Coag negative staph. Nitrofurantoin prescribed   . UTI (lower urinary tract infection) 04/03/2016   Proteus treated with Septra DS   Past Surgical History:  Procedure Laterality Date  . BREAST SURGERY    . ERCP N/A 01/22/2016   Procedure: ENDOSCOPIC RETROGRADE CHOLANGIOPANCREATOGRAPHY (ERCP) With biliary sphincterotomy and stone extraction ;  Surgeon: Rogene Houston, MD;  Location: [facility];  Service: Endoscopy;  Laterality: N/A;  . ESOPHAGOGASTRODUODENOSCOPY N/A 05/07/2015   Procedure: ESOPHAGOGASTRODUODENOSCOPY (EGD);  Surgeon: Rogene Houston, MD;  Location: [facility];  Service: Endoscopy;  Laterality: N/A;    Allergies  Allergen Reactions  . Codeine Nausea And Vomiting  . Nsaids     Outpatient Encounter Prescriptions as of 03/22/2017  Medication Sig  . acetaminophen (TYLENOL) 325 MG tablet Take 650 mg by mouth every 6 (six) hours as needed.  Roseanne Kaufman Peru-Castor Oil (VENELEX) OINT Apply to sacrum and bilateral buttock every shift and as needed for prevention  . Cholecalciferol 1000 units TBDP Take 1 tablet by mouth daily  . cloNIDine (CATAPRES) 0.1 MG tablet Take 0.1 mg by mouth 2 (two) times daily.  Marland Kitchen docusate sodium (COLACE) 100 MG capsule Take 100 mg by mouth every morning.  . ferrous sulfate (FERROUSUL) 325 (65 FE) MG tablet Take 325 mg by mouth daily with breakfast.  . ipratropium-albuterol (DUONEB) 0.5-2.5 (3)  MG/3ML SOLN Take 3 mLs by nebulization every 6 (six) hours as needed.  Marland Kitchen levothyroxine (SYNTHROID, LEVOTHROID) 175 MCG tablet Take 175 mcg by mouth daily before breakfast.  . losartan (COZAAR) 50 MG tablet Take 50 mg by mouth daily.  . Multiple Vitamins-Minerals (MULTIVITAMIN WITH MINERALS) tablet Take 1 tablet by mouth daily.  Marland Kitchen omeprazole (PRILOSEC) 20 MG capsule Take 20 mg by mouth daily.  . polyvinyl alcohol (LIQUIFILM TEARS) 1.4 % ophthalmic solution Place 1 drop into both eyes every 4 (four) hours.   . simvastatin  (ZOCOR) 10 MG tablet Take 10 mg by mouth at bedtime.  . traMADol (ULTRAM) 50 MG tablet Take 2 tablets (100 mg total) by mouth at bedtime.  . [DISCONTINUED] cloNIDine (CATAPRES) 0.2 MG tablet Take 0.2 mg by mouth at bedtime.  . [DISCONTINUED] fluticasone (FLONASE) 50 MCG/ACT nasal spray Place 1 spray into both nostrils 2 (two) times daily.  . [DISCONTINUED] guaiFENesin (MUCINEX) 600 MG 12 hr tablet Take 600 mg by mouth 2 (two) times daily.   No facility-administered encounter medications on file as of 03/22/2017.     Review of Systems  Immunization History  Administered Date(s) Administered  . Influenza,inj,Quad PF,36+ Mos 05/04/2015  . Influenza-Unspecified 05/28/2016  . Pneumococcal-Unspecified 06/10/2016   Pertinent  Health Maintenance Due  Topic Date Due  . DEXA SCAN  07/30/2017 (Originally 11/30/1988)  . INFLUENZA VACCINE  03/30/2017  . PNA vac Low Risk Adult (2 of 2 - PCV13) 06/10/2017   No flowsheet data found. Functional Status Survey:    Vitals:   03/22/17 1620  BP: (!) 150/77  Pulse: 69  Resp: 18  Temp: 97.8 F (36.6 C)   There is no height or weight on file to calculate BMI. Physical Exam  Constitutional: She appears well-developed and well-nourished.  HENT:  Head: Normocephalic.  Mouth/Throat: Oropharynx is clear and moist.  Has no teeth  Neck: Neck supple.  Cardiovascular: Normal rate and regular rhythm.   Pulmonary/Chest: Effort normal. No respiratory distress. She has no wheezes. She has no rales.  Abdominal: Soft. Bowel sounds are normal. She exhibits no distension. There is no tenderness. There is no rebound.  Neurological: She is alert.    Labs reviewed:  Recent Labs  12/27/16 0900 01/25/17 0715 03/02/17 0430  NA 138 139 136  K 3.7 3.5 3.9  CL 103 105 101  CO2 28 26 25   GLUCOSE 116* 99 95  BUN 25* 36* 25*  CREATININE 0.66 0.86 0.79  CALCIUM 9.0 8.8* 8.5*    Recent Labs  09/01/16 1700 12/27/16 0900 01/25/17 0715  AST 18 20 19   ALT  12* 11* 11*  ALKPHOS 47 47 48  BILITOT 0.2* 0.2* 0.5  PROT 7.2 7.2 7.2  ALBUMIN 2.8* 3.0* 3.0*    Recent Labs  01/25/17 0715 03/02/17 0430 03/09/17 0800  WBC 5.4 4.9 5.0  NEUTROABS 3.2 1.9 2.5  HGB 9.9* 10.1* 10.0*  HCT 31.3* 31.0* 30.9*  MCV 88.9 87.6 88.3  PLT 262 268 307   Lab Results  Component Value Date   TSH 1.813 10/29/2016   No results found for: HGBA1C Lab Results  Component Value Date   CHOL 118 12/27/2016   HDL 47 12/27/2016   LDLCALC 53 12/27/2016   TRIG 92 12/27/2016   CHOLHDL 2.5 12/27/2016    Significant Diagnostic Results in last 30 days:  No results found.  Assessment/Plan  Osteoporosis Patient DEXA scan shows her T score is -3.4 D/W daughter about starting her on Bi phosphanate.  I have d/w her the Side effect of the drug. Also told her that she is at high risk of Fracture. Patient did have some gastritis and GERD in 2016 EGD but no Ulcer or Esophagitis. Patient daughter says she will d/w her family and let me know. GERD Patient on PPI and per Pharmacy needs to be changed to H2 blocker. Will go ahead and start her on Zantac. Family/ staff Communication:  With Daughter  Labs/tests ordered:   Total time spent in this patient care encounter was 25_ minutes; greater than 50% of the visit spent counseling patient, reviewing records , Labs and coordinating care for problems addressed at this encounter.

## 2017-04-14 ENCOUNTER — Encounter (HOSPITAL_COMMUNITY)
Admission: RE | Admit: 2017-04-14 | Discharge: 2017-04-14 | Disposition: A | Discharge status: Home / Self Care | Payer: Medicare Other | Source: Skilled Nursing Facility | Attending: Internal Medicine | Admitting: Internal Medicine

## 2017-04-14 DIAGNOSIS — Z48815 Encounter for surgical aftercare following surgery on the digestive system: Secondary | ICD-10-CM | POA: Diagnosis not present

## 2017-04-14 DIAGNOSIS — N390 Urinary tract infection, site not specified: Secondary | ICD-10-CM | POA: Insufficient documentation

## 2017-04-14 DIAGNOSIS — R262 Difficulty in walking, not elsewhere classified: Secondary | ICD-10-CM | POA: Insufficient documentation

## 2017-04-14 DIAGNOSIS — D509 Iron deficiency anemia, unspecified: Secondary | ICD-10-CM | POA: Diagnosis not present

## 2017-04-14 DIAGNOSIS — E785 Hyperlipidemia, unspecified: Secondary | ICD-10-CM | POA: Diagnosis not present

## 2017-04-14 DIAGNOSIS — R17 Unspecified jaundice: Secondary | ICD-10-CM | POA: Diagnosis not present

## 2017-04-14 DIAGNOSIS — I10 Essential (primary) hypertension: Secondary | ICD-10-CM | POA: Diagnosis not present

## 2017-04-14 DIAGNOSIS — E039 Hypothyroidism, unspecified: Secondary | ICD-10-CM | POA: Diagnosis not present

## 2017-04-14 LAB — URINALYSIS, ROUTINE W REFLEX MICROSCOPIC
BILIRUBIN URINE: NEGATIVE
Glucose, UA: NEGATIVE mg/dL
Ketones, ur: NEGATIVE mg/dL
NITRITE: NEGATIVE
PH: 5 (ref 5.0–8.0)
Protein, ur: 30 mg/dL — AB
SPECIFIC GRAVITY, URINE: 1.017 (ref 1.005–1.030)
Trans Epithel, UA: 2

## 2017-04-15 LAB — URINE CULTURE: CULTURE: NO GROWTH

## 2017-05-03 ENCOUNTER — Non-Acute Institutional Stay (SKILLED_NURSING_FACILITY): Payer: Medicare Other | Admitting: Internal Medicine

## 2017-05-03 ENCOUNTER — Encounter: Payer: Self-pay | Admitting: Internal Medicine

## 2017-05-03 DIAGNOSIS — E559 Vitamin D deficiency, unspecified: Secondary | ICD-10-CM

## 2017-05-03 DIAGNOSIS — E039 Hypothyroidism, unspecified: Secondary | ICD-10-CM | POA: Diagnosis not present

## 2017-05-03 DIAGNOSIS — M810 Age-related osteoporosis without current pathological fracture: Secondary | ICD-10-CM | POA: Diagnosis not present

## 2017-05-03 DIAGNOSIS — D509 Iron deficiency anemia, unspecified: Secondary | ICD-10-CM

## 2017-05-03 DIAGNOSIS — G309 Alzheimer's disease, unspecified: Secondary | ICD-10-CM

## 2017-05-03 DIAGNOSIS — I10 Essential (primary) hypertension: Secondary | ICD-10-CM

## 2017-05-03 DIAGNOSIS — K219 Gastro-esophageal reflux disease without esophagitis: Secondary | ICD-10-CM

## 2017-05-03 DIAGNOSIS — F0281 Dementia in other diseases classified elsewhere with behavioral disturbance: Secondary | ICD-10-CM

## 2017-05-03 DIAGNOSIS — E785 Hyperlipidemia, unspecified: Secondary | ICD-10-CM

## 2017-05-03 NOTE — Progress Notes (Signed)
Location:   [facility] Room Number: 157/W Place of Service:  SNF (31) Provider:  Geralyn Flash, MD  Patient Care Team: [name], MD as PCP - General (Internal Medicine)  Extended Emergency Contact Information Primary Emergency Contact: [name] Address: [address]          [place],  [place] Home Phone: [phone] Relation: None Secondary Emergency Contact: [place], [place] [address] [name] of [place] Phone: [phone] Relation: Grandson  Code Status:  DNR Goals of care: Advanced Directive information Advanced Directives 05/03/2017  Does Patient Have a Medical Advance Directive? Yes  Type of Advance Directive Out of facility DNR (pink MOST or yellow form)  Does patient want to make changes to medical advance directive? No - Patient declined  Copy of [place] in Chart? -  Would patient like information on creating a medical advance directive? -  Pre-existing out of facility DNR order (yellow form or pink MOST form) -     Chief Complaint  Patient presents with  . Medical Management of Chronic Issues    Routine Visit    HPI:  Pt is a 83 y.o. female seen today for medical management of chronic diseases.    Patient has H/O Dementia, hypothyroidism, Hypertension and anemia  Patient is long term resident of Facility. She has daughter and Yolanda Bonine who come and visit her and usually feed her 3 times a day and help the Nurses take care of her. Her weight is now upto 142 lbs from 133 lbs 2 months ago and actually 122 4 months ago. Her daughter also complained that patient urine has been smelling and she thinks her Mom has UTI. Otherwise no fever or chills. No Nausea or vomiting. Patient is hard to get any history due to her Dementia but she does seem more Interactive and responsive. Patient has had no New Nursing issues.    Past Medical History:  Diagnosis Date  .  Anemia   . Arthritis   . Bradycardia   . Cancer [facility])    had breast cancer 8 yrs ago  . 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 GERD (gastroesophageal reflux disease)   . HCAP (healthcare-associated pneumonia)   . Hypertension   . Hypokalemia   . Hypomagnesemia   . Osteoarthritis   . Pancreatitis, acute 05/03/2015  . Thyroid disease   . Urinary tract infection 02/07/16   Coag negative staph. Nitrofurantoin prescribed   . UTI (lower urinary tract infection) 04/03/2016   Proteus treated with Septra DS   Past Surgical History:  Procedure Laterality Date  . BREAST SURGERY    . ERCP N/A 01/22/2016   Procedure: ENDOSCOPIC RETROGRADE CHOLANGIOPANCREATOGRAPHY (ERCP) With biliary sphincterotomy and stone extraction ;  Surgeon: Rogene Houston, MD;  Location: [facility] ORS;  Service: Endoscopy;  Laterality: N/A;  . ESOPHAGOGASTRODUODENOSCOPY N/A 05/07/2015   Procedure: ESOPHAGOGASTRODUODENOSCOPY (EGD);  Surgeon: Rogene Houston, MD;  Location: [facility] ENDO SUITE;  Service: Endoscopy;  Laterality: N/A;    Allergies  Allergen Reactions  . Codeine Nausea And Vomiting  . Nsaids     Outpatient Encounter Prescriptions as of 05/03/2017  Medication Sig  . acetaminophen (TYLENOL) 325 MG tablet Take 650 mg by mouth every 6 (six) hours as needed.  Roseanne Kaufman Peru-Castor Oil (VENELEX) OINT Apply to sacrum and bilateral buttock every shift and as needed for prevention  .  Cholecalciferol 1000 units TBDP Take 1 tablet by mouth daily  . cloNIDine (CATAPRES) 0.1 MG tablet Take 0.1 mg by mouth 2 (two) times daily.  Marland Kitchen docusate sodium (COLACE) 100 MG capsule Take 100 mg by mouth every morning.  . ferrous sulfate (FERROUSUL) 325 (65 FE) MG tablet Take 325 mg by mouth daily with breakfast.  . ipratropium-albuterol (DUONEB) 0.5-2.5 (3) MG/3ML SOLN Take 3 mLs by nebulization every 6 (six) hours as needed.  Marland Kitchen levothyroxine (SYNTHROID, LEVOTHROID) 175 MCG tablet Take 175 mcg by mouth daily before breakfast.  .  losartan (COZAAR) 50 MG tablet Take 50 mg by mouth daily.  . Multiple Vitamins-Minerals (MULTIVITAMIN WITH MINERALS) tablet Take 1 tablet by mouth daily.  . polyethylene glycol (MIRALAX / GLYCOLAX) packet Take 17 g by mouth every evening.  . polyvinyl alcohol (LIQUIFILM TEARS) 1.4 % ophthalmic solution Place 1 drop into both eyes every 4 (four) hours.   . ranitidine (ZANTAC) 150 MG tablet Take 150 mg by mouth daily.  . simvastatin (ZOCOR) 10 MG tablet Take 10 mg by mouth at bedtime.  . traMADol (ULTRAM) 50 MG tablet Take 2 tablets (100 mg total) by mouth at bedtime.  . [DISCONTINUED] omeprazole (PRILOSEC) 20 MG capsule Take 20 mg by mouth daily.   No facility-administered encounter medications on file as of 05/03/2017.      Review of Systems  Unable to perform ROS: Dementia    Immunization History  Administered Date(s) Administered  . Influenza,inj,Quad PF,6+ Mos 05/04/2015  . Influenza-Unspecified 05/28/2016  . Pneumococcal-Unspecified 06/10/2016   Pertinent  Health Maintenance Due  Topic Date Due  . INFLUENZA VACCINE  07/30/2017 (Originally 03/30/2017)  . DEXA SCAN  07/30/2017 (Originally 11/30/1988)  . PNA vac Low Risk Adult (2 of 2 - PCV13) 06/10/2017   No flowsheet data found. Functional Status Survey:    Vitals:   05/03/17 0838  BP: (!) 127/59  Pulse: (!) 59  Resp: 18  Temp: 98.2 F (36.8 C)  TempSrc: Oral  Weight: 142 lb 3.2 oz (64.5 kg)  Height: 5\' 4"  (1.626 m)   Body mass index is 24.41 kg/m. Physical Exam  Constitutional: She appears well-developed.  HENT:  Head: Normocephalic.  Mouth/Throat: Oropharynx is clear and moist.  Eyes: Pupils are equal, round, and reactive to light.  Neck: Neck supple.  Cardiovascular: Normal rate and intact distal pulses.   Murmur heard. Pulmonary/Chest: Effort normal and breath sounds normal. No respiratory distress. She has no wheezes. She has no rales.  Abdominal: Soft. Bowel sounds are normal. She exhibits no distension.  There is no tenderness. There is no rebound.  Musculoskeletal:  Edema Right More then Left With more Stiffness in right UE and LE .  Neurological: She is alert.  Skin: Skin is warm and dry.  Psychiatric: She has a normal mood and affect. Her behavior is normal.    Labs reviewed:  Recent Labs  12/27/16 0900 01/25/17 0715 03/02/17 0430  NA 138 139 136  K 3.7 3.5 3.9  CL 103 105 101  CO2 28 26 25   GLUCOSE 116* 99 95  BUN 25* 36* 25*  CREATININE 0.66 0.86 0.79  CALCIUM 9.0 8.8* 8.5*    Recent Labs  09/01/16 1700 12/27/16 0900 01/25/17 0715  AST 18 20 19   ALT 12* 11* 11*  ALKPHOS 47 47 48  BILITOT 0.2* 0.2* 0.5  PROT 7.2 7.2 7.2  ALBUMIN 2.8* 3.0* 3.0*    Recent Labs  01/25/17 0715 03/02/17 0430 03/09/17 0800  WBC 5.4  4.9 5.0  NEUTROABS 3.2 1.9 2.5  HGB 9.9* 10.1* 10.0*  HCT 31.3* 31.0* 30.9*  MCV 88.9 87.6 88.3  PLT 262 268 307   Lab Results  Component Value Date   TSH 1.813 10/29/2016   No results found for: HGBA1C Lab Results  Component Value Date   CHOL 118 12/27/2016   HDL 47 12/27/2016   LDLCALC 53 12/27/2016   TRIG 92 12/27/2016   CHOLHDL 2.5 12/27/2016    Significant Diagnostic Results in last 30 days:  No results found.  Assessment/Plan  Essential hypertension BP controlled on Clonidine and Losartan. Renal function Stable.  Hypothyroidism TSH Normal In 03/18 Continue same dose.  GERD Per pharmacy her Prilosec is changed ro Zantac  Age-related osteoporosis  T score -3.4 Per grand son her Daughter wanted to talk to me again for D/W Therapy  Weight Gain Patient has gained 10 more pounds Since my last visit. D/W family they have been feeding her food from home and she is doing well. I did tell them to not Overfeed her as she is not Physically active. Will continue to moniotor weight.,  Iron deficiency anemia, Hgb Stable on iron Supplement Guaiac negative in the past No More work Up due to her Age  Vitamin D deficiency Levels  Normal on supplement  Hyperlipidemia  LDL was 53 in 04/18 Continue Simavastatin  Family/ staff Communication:   Labs/tests ordered:  Check UA   Follow TSH and CBC in Next Visit

## 2017-05-04 ENCOUNTER — Other Ambulatory Visit (HOSPITAL_COMMUNITY)
Admission: RE | Admit: 2017-05-04 | Discharge: 2017-05-04 | Disposition: A | Discharge status: Home / Self Care | Payer: Medicare Other | Source: Skilled Nursing Facility | Attending: Internal Medicine | Admitting: Internal Medicine

## 2017-05-04 ENCOUNTER — Other Ambulatory Visit (HOSPITAL_COMMUNITY): Admission: RE | Admit: 2017-05-04 | Payer: Medicare Other | Admitting: *Deleted

## 2017-05-04 DIAGNOSIS — Z48815 Encounter for surgical aftercare following surgery on the digestive system: Secondary | ICD-10-CM | POA: Diagnosis not present

## 2017-05-04 LAB — URINALYSIS, COMPLETE (UACMP) WITH MICROSCOPIC
BILIRUBIN URINE: NEGATIVE
Glucose, UA: NEGATIVE mg/dL
KETONES UR: NEGATIVE mg/dL
Nitrite: POSITIVE — AB
PROTEIN: NEGATIVE mg/dL
SPECIFIC GRAVITY, URINE: 1.015 (ref 1.005–1.030)
pH: 5 (ref 5.0–8.0)

## 2017-05-06 DIAGNOSIS — F0390 Unspecified dementia without behavioral disturbance: Secondary | ICD-10-CM | POA: Diagnosis not present

## 2017-05-06 DIAGNOSIS — M25511 Pain in right shoulder: Secondary | ICD-10-CM | POA: Diagnosis not present

## 2017-05-06 DIAGNOSIS — R278 Other lack of coordination: Secondary | ICD-10-CM | POA: Diagnosis not present

## 2017-05-06 DIAGNOSIS — M79641 Pain in right hand: Secondary | ICD-10-CM | POA: Diagnosis not present

## 2017-05-06 LAB — URINE CULTURE

## 2017-05-09 ENCOUNTER — Non-Acute Institutional Stay (SKILLED_NURSING_FACILITY): Payer: Medicare Other

## 2017-05-09 DIAGNOSIS — R278 Other lack of coordination: Secondary | ICD-10-CM | POA: Diagnosis not present

## 2017-05-09 DIAGNOSIS — Z0000 Encounter for general adult medical examination without abnormal findings: Secondary | ICD-10-CM

## 2017-05-09 DIAGNOSIS — M79641 Pain in right hand: Secondary | ICD-10-CM | POA: Diagnosis not present

## 2017-05-09 DIAGNOSIS — F0390 Unspecified dementia without behavioral disturbance: Secondary | ICD-10-CM | POA: Diagnosis not present

## 2017-05-09 DIAGNOSIS — M25511 Pain in right shoulder: Secondary | ICD-10-CM | POA: Diagnosis not present

## 2017-05-09 NOTE — Patient Instructions (Addendum)
Jo Hardin , Thank you for taking time to come for your Medicare Wellness Visit. I appreciate your ongoing commitment to your health goals. Please review the following plan we discussed and let me know if I can assist you in the future.   Screening recommendations/referrals: Colonoscopy excluded, pt over age 81 Mammogram excluded, pt over age 49 Bone Density up to date Recommended yearly ophthalmology/optometry visit for glaucoma screening and checkup Recommended yearly dental visit for hygiene and checkup  Vaccinations: Influenza vaccine due Pneumococcal vaccine up to date. Prevnar due 06/10/17 Tdap vaccine due Shingles vaccine not in records    Advanced directives: DNR in chart, copies of health care power of attorney and living will are needed   Conditions/risks identified: None  Next appointment: Dr. Lyndel Safe makes rounds   Preventive Care 65 Years and Older, Female Preventive care refers to lifestyle choices and visits with your health care provider that can promote health and wellness. What does preventive care include?  A yearly physical exam. This is also called an annual well check.  Dental exams once or twice a year.  Routine eye exams. Ask your health care provider how often you should have your eyes checked.  Personal lifestyle choices, including:  Daily care of your teeth and gums.  Regular physical activity.  Eating a healthy diet.  Avoiding tobacco and drug use.  Limiting alcohol use.  Practicing safe sex.  Taking low-dose aspirin every day.  Taking vitamin and mineral supplements as recommended by your health care provider. What happens during an annual well check? The services and screenings done by your health care provider during your annual well check will depend on your age, overall health, lifestyle risk factors, and family history of disease. Counseling  Your health care provider may ask you questions about your:  Alcohol use.  Tobacco  use.  Drug use.  Emotional well-being.  Home and relationship well-being.  Sexual activity.  Eating habits.  History of falls.  Memory and ability to understand (cognition).  Work and work Statistician.  Reproductive health. Screening  You may have the following tests or measurements:  Height, weight, and BMI.  Blood pressure.  Lipid and cholesterol levels. These may be checked every 5 years, or more frequently if you are over 83 years old.  Skin check.  Lung cancer screening. You may have this screening every year starting at age 76 if you have a 30-pack-year history of smoking and currently smoke or have quit within the past 15 years.  Fecal occult blood test (FOBT) of the stool. You may have this test every year starting at age 23.  Flexible sigmoidoscopy or colonoscopy. You may have a sigmoidoscopy every 5 years or a colonoscopy every 10 years starting at age 50.  Hepatitis C blood test.  Hepatitis B blood test.  Sexually transmitted disease (STD) testing.  Diabetes screening. This is done by checking your blood sugar (glucose) after you have not eaten for a while (fasting). You may have this done every 1-3 years.  Bone density scan. This is done to screen for osteoporosis. You may have this done starting at age 71.  Mammogram. This may be done every 1-2 years. Talk to your health care provider about how often you should have regular mammograms. Talk with your health care provider about your test results, treatment options, and if necessary, the need for more tests. Vaccines  Your health care provider may recommend certain vaccines, such as:  Influenza vaccine. This is recommended every year.  Tetanus, diphtheria, and acellular pertussis (Tdap, Td) vaccine. You may need a Td booster every 10 years.  Zoster vaccine. You may need this after age 52.  Pneumococcal 13-valent conjugate (PCV13) vaccine. One dose is recommended after age 26.  Pneumococcal  polysaccharide (PPSV23) vaccine. One dose is recommended after age 84. Talk to your health care provider about which screenings and vaccines you need and how often you need them. This information is not intended to replace advice given to you by your health care provider. Make sure you discuss any questions you have with your health care provider. Document Released: 09/12/2015 Document Revised: 05/05/2016 Document Reviewed: 06/17/2015 Elsevier Interactive Patient Education  2017 [place] Prevention in the Home Falls can cause injuries. They can happen to people of all ages. There are many things you can do to make your home safe and to help prevent falls. What can I do on the outside of my home?  Regularly fix the edges of walkways and driveways and fix any cracks.  Remove anything that might make you trip as you walk through a door, such as a raised step or threshold.  Trim any bushes or trees on the path to your home.  Use bright outdoor lighting.  Clear any walking paths of anything that might make someone trip, such as rocks or tools.  Regularly check to see if handrails are loose or broken. Make sure that both sides of any steps have handrails.  Any raised decks and porches should have guardrails on the edges.  Have any leaves, snow, or ice cleared regularly.  Use sand or salt on walking paths during winter.  Clean up any spills in your garage right away. This includes oil or grease spills. What can I do in the bathroom?  Use night lights.  Install grab bars by the toilet and in the tub and shower. Do not use towel bars as grab bars.  Use non-skid mats or decals in the tub or shower.  If you need to sit down in the shower, use a plastic, non-slip stool.  Keep the floor dry. Clean up any water that spills on the floor as soon as it happens.  Remove soap buildup in the tub or shower regularly.  Attach bath mats securely with double-sided non-slip rug  tape.  Do not have throw rugs and other things on the floor that can make you trip. What can I do in the bedroom?  Use night lights.  Make sure that you have a light by your bed that is easy to reach.  Do not use any sheets or blankets that are too big for your bed. They should not hang down onto the floor.  Have a firm chair that has side arms. You can use this for support while you get dressed.  Do not have throw rugs and other things on the floor that can make you trip. What can I do in the kitchen?  Clean up any spills right away.  Avoid walking on wet floors.  Keep items that you use a lot in easy-to-reach places.  If you need to reach something above you, use a strong step stool that has a grab bar.  Keep electrical cords out of the way.  Do not use floor polish or wax that makes floors slippery. If you must use wax, use non-skid floor wax.  Do not have throw rugs and other things on the floor that can make you trip. What can I do  with my stairs?  Do not leave any items on the stairs.  Make sure that there are handrails on both sides of the stairs and use them. Fix handrails that are broken or loose. Make sure that handrails are as long as the stairways.  Check any carpeting to make sure that it is firmly attached to the stairs. Fix any carpet that is loose or worn.  Avoid having throw rugs at the top or bottom of the stairs. If you do have throw rugs, attach them to the floor with carpet tape.  Make sure that you have a light switch at the top of the stairs and the bottom of the stairs. If you do not have them, ask someone to add them for you. What else can I do to help prevent falls?  Wear shoes that:  Do not have high heels.  Have rubber bottoms.  Are comfortable and fit you well.  Are closed at the toe. Do not wear sandals.  If you use a stepladder:  Make sure that it is fully opened. Do not climb a closed stepladder.  Make sure that both sides of the  stepladder are locked into place.  Ask someone to hold it for you, if possible.  Clearly mark and make sure that you can see:  Any grab bars or handrails.  First and last steps.  Where the edge of each step is.  Use tools that help you move around (mobility aids) if they are needed. These include:  Canes.  Walkers.  Scooters.  Crutches.  Turn on the lights when you go into a dark area. Replace any light bulbs as soon as they burn out.  Set up your furniture so you have a clear path. Avoid moving your furniture around.  If any of your floors are uneven, fix them.  If there are any pets around you, be aware of where they are.  Review your medicines with your doctor. Some medicines can make you feel dizzy. This can increase your chance of falling. Ask your doctor what other things that you can do to help prevent falls. This information is not intended to replace advice given to you by your health care provider. Make sure you discuss any questions you have with your health care provider. Document Released: 06/12/2009 Document Revised: 01/22/2016 Document Reviewed: 09/20/2014 Elsevier Interactive Patient Education  2017 [employer].

## 2017-05-09 NOTE — Progress Notes (Signed)
Subjective:   Jo Hardin is a 83 y.o. female who presents for an Initial Medicare Annual Wellness Visit at [place]; incapacitated patient unable to answer questions appropriately     Objective:    Today's Vitals   05/09/17 1446  BP: 136/70  Pulse: 60  Temp: 98 F (36.7 C)  TempSrc: Oral  SpO2: 97%  Weight: 142 lb (64.4 kg)  Height: 5\' 4"  (1.626 m)   Body mass index is 24.37 kg/m.   Current Medications (verified) Outpatient Encounter Prescriptions as of 05/09/2017  Medication Sig  . acetaminophen (TYLENOL) 325 MG tablet Take 650 mg by mouth every 6 (six) hours as needed.  Roseanne Kaufman Peru-Castor Oil (VENELEX) OINT Apply to sacrum and bilateral buttock every shift and as needed for prevention  . Cholecalciferol 1000 units TBDP Take 1 tablet by mouth daily  . cloNIDine (CATAPRES) 0.1 MG tablet Take 0.1 mg by mouth 2 (two) times daily.  Marland Kitchen docusate sodium (COLACE) 100 MG capsule Take 100 mg by mouth every morning.  . ferrous sulfate (FERROUSUL) 325 (65 FE) MG tablet Take 325 mg by mouth daily with breakfast.  . ipratropium-albuterol (DUONEB) 0.5-2.5 (3) MG/3ML SOLN Take 3 mLs by nebulization every 6 (six) hours as needed.  Marland Kitchen levothyroxine (SYNTHROID, LEVOTHROID) 175 MCG tablet Take 175 mcg by mouth daily before breakfast.  . losartan (COZAAR) 50 MG tablet Take 50 mg by mouth daily.  . Multiple Vitamins-Minerals (MULTIVITAMIN WITH MINERALS) tablet Take 1 tablet by mouth daily.  . polyethylene glycol (MIRALAX / GLYCOLAX) packet Take 17 g by mouth every evening.  . polyvinyl alcohol (LIQUIFILM TEARS) 1.4 % ophthalmic solution Place 1 drop into both eyes every 4 (four) hours.   . ranitidine (ZANTAC) 150 MG tablet Take 150 mg by mouth daily.  . simvastatin (ZOCOR) 10 MG tablet Take 10 mg by mouth at bedtime.  . traMADol (ULTRAM) 50 MG tablet Take 2 tablets (100 mg total) by mouth at bedtime.   No facility-administered encounter medications on file as of  05/09/2017.     Allergies (verified) Codeine and Nsaids   History: Past Medical History:  Diagnosis Date  . Anemia   . Arthritis   . Bradycardia   . Cancer [facility])    had breast cancer 8 yrs ago  . 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 GERD (gastroesophageal reflux disease)   . HCAP (healthcare-associated pneumonia)   . Hypertension   . Hypokalemia   . Hypomagnesemia   . Osteoarthritis   . Pancreatitis, acute 05/03/2015  . Thyroid disease   . Urinary tract infection 02/07/16   Coag negative staph. Nitrofurantoin prescribed   . UTI (lower urinary tract infection) 04/03/2016   Proteus treated with Septra DS   Past Surgical History:  Procedure Laterality Date  . BREAST SURGERY    . ERCP N/A 01/22/2016   Procedure: ENDOSCOPIC RETROGRADE CHOLANGIOPANCREATOGRAPHY (ERCP) With biliary sphincterotomy and stone extraction ;  Surgeon: Rogene Houston, MD;  Location: [facility] OR[facility];  Service: Endoscopy;  Laterality: N/A;  . ESOPHAGOGASTRODUODENOSCOPY N/A 05/07/2015   Procedure: ESOPHAGOGASTRODUODENOSCOPY (EGD);  Surgeon: Rogene Houston, MD;  Location: [facility];  Service: Endoscopy;  Laterality: N/A;   Family History  Problem Relation Age of Onset  . Cancer Neg Hx   . Diabetes Neg Hx   . Heart disease Neg Hx   . Stroke Neg Hx    Social History   Occupational History  . Not on  file.   Social History Main Topics  . Smoking status: Never Smoker  . Smokeless tobacco: Never Used  . Alcohol use No  . Drug use: No  . Sexual activity: Not on file    Tobacco Counseling Counseling given: Not Answered   Activities of Daily Living In your present state of health, do you have any difficulty performing the following activities: 05/09/2017  Hearing? N  Vision? Y  Difficulty concentrating or making decisions? Y  Walking or climbing stairs? Y  Dressing or bathing? Y  Doing errands, shopping? Y  Preparing Food and eating ? Y  Using the Toilet? Y  In the past six  months, have you accidently leaked urine? Y  Do you have problems with loss of bowel control? Y  Managing your Medications? Y  Managing your Finances? Y  Housekeeping or managing your Housekeeping? Y  Some recent data might be hidden    Immunizations and Health Maintenance Immunization History  Administered Date(s) Administered  . Influenza,inj,Quad PF,6+ Mos 05/04/2015  . Influenza-Unspecified 05/28/2016  . Pneumococcal-Unspecified 06/10/2016   There are no preventive care reminders to display for this patient.  Patient Care Team: [name], MD as PCP - General (Internal Medicine)  Indicate any recent Medical Services you may have received from other than [facility] providers in the past year (date may be approximate).     Assessment:   This is a routine wellness examination for Jo Hardin.   Hearing/Vision screen No exam data present  Dietary issues and exercise activities discussed: Current Exercise Habits: The patient does not participate in regular exercise at present, Exercise limited by: neurologic condition(s)  Goals    None     Depression Screen PHQ 2/9 Scores 05/09/2017  PHQ - 2 Score 0    Fall Risk Fall Risk  05/09/2017  Falls in the past year? No    Cognitive Function:     6CIT Screen 05/09/2017  What Year? 4 points  What month? 3 points  What time? 3 points  Count back from 20 4 points  Months in reverse 4 points  Repeat phrase 10 points  Total Score 28    Screening Tests Health Maintenance  Topic Date Due  . INFLUENZA VACCINE  07/30/2017 (Originally 03/30/2017)  . DEXA SCAN  07/30/2017 (Originally 11/30/1988)  . TETANUS/TDAP  02/09/2026 (Originally 12/01/1940)  . PNA vac Low Risk Adult (2 of 2 - PCV13) 06/10/2017      Plan:    I have personally reviewed and addressed the Medicare Annual Wellness questionnaire and have noted the following in the patient's chart:  A. Medical and social history B. Use of alcohol, tobacco or illicit drugs  C. Current  medications and supplements D. Functional ability and status E.  Nutritional status F.  Physical activity G. Advance directives H. List of other physicians I.  Hospitalizations, surgeries, and ER visits in previous 12 months J.  [place] to include hearing, vision, cognitive, depression L. Referrals and appointments - none  In addition, I am unable to review and discuss with incapacitated patient certain preventive protocols, quality metrics, and best practice recommendations. A written personalized care plan for preventive services as well as general preventive health recommendations were provided to patient.   See attached scanned questionnaire for additional information.   Signed,   Rich Reining, RN Nurse Health Advisor   Quick Notes   Health Maintenance: TDAP      Abnormal Screen: 6 CIT-28     Patient Concerns: None  Nurse Concerns: None

## 2017-05-10 DIAGNOSIS — M79641 Pain in right hand: Secondary | ICD-10-CM | POA: Diagnosis not present

## 2017-05-10 DIAGNOSIS — R278 Other lack of coordination: Secondary | ICD-10-CM | POA: Diagnosis not present

## 2017-05-10 DIAGNOSIS — M25511 Pain in right shoulder: Secondary | ICD-10-CM | POA: Diagnosis not present

## 2017-05-10 DIAGNOSIS — F0390 Unspecified dementia without behavioral disturbance: Secondary | ICD-10-CM | POA: Diagnosis not present

## 2017-05-11 DIAGNOSIS — M25511 Pain in right shoulder: Secondary | ICD-10-CM | POA: Diagnosis not present

## 2017-05-11 DIAGNOSIS — F0390 Unspecified dementia without behavioral disturbance: Secondary | ICD-10-CM | POA: Diagnosis not present

## 2017-05-11 DIAGNOSIS — R278 Other lack of coordination: Secondary | ICD-10-CM | POA: Diagnosis not present

## 2017-05-11 DIAGNOSIS — M79641 Pain in right hand: Secondary | ICD-10-CM | POA: Diagnosis not present

## 2017-05-12 DIAGNOSIS — F0390 Unspecified dementia without behavioral disturbance: Secondary | ICD-10-CM | POA: Diagnosis not present

## 2017-05-12 DIAGNOSIS — M25511 Pain in right shoulder: Secondary | ICD-10-CM | POA: Diagnosis not present

## 2017-05-12 DIAGNOSIS — R278 Other lack of coordination: Secondary | ICD-10-CM | POA: Diagnosis not present

## 2017-05-12 DIAGNOSIS — M79641 Pain in right hand: Secondary | ICD-10-CM | POA: Diagnosis not present

## 2017-05-13 DIAGNOSIS — M25511 Pain in right shoulder: Secondary | ICD-10-CM | POA: Diagnosis not present

## 2017-05-13 DIAGNOSIS — M79641 Pain in right hand: Secondary | ICD-10-CM | POA: Diagnosis not present

## 2017-05-13 DIAGNOSIS — F0390 Unspecified dementia without behavioral disturbance: Secondary | ICD-10-CM | POA: Diagnosis not present

## 2017-05-13 DIAGNOSIS — R278 Other lack of coordination: Secondary | ICD-10-CM | POA: Diagnosis not present

## 2017-05-16 DIAGNOSIS — F0390 Unspecified dementia without behavioral disturbance: Secondary | ICD-10-CM | POA: Diagnosis not present

## 2017-05-16 DIAGNOSIS — M79641 Pain in right hand: Secondary | ICD-10-CM | POA: Diagnosis not present

## 2017-05-16 DIAGNOSIS — M25511 Pain in right shoulder: Secondary | ICD-10-CM | POA: Diagnosis not present

## 2017-05-16 DIAGNOSIS — R278 Other lack of coordination: Secondary | ICD-10-CM | POA: Diagnosis not present

## 2017-05-17 DIAGNOSIS — M79641 Pain in right hand: Secondary | ICD-10-CM | POA: Diagnosis not present

## 2017-05-17 DIAGNOSIS — F0390 Unspecified dementia without behavioral disturbance: Secondary | ICD-10-CM | POA: Diagnosis not present

## 2017-05-17 DIAGNOSIS — M25511 Pain in right shoulder: Secondary | ICD-10-CM | POA: Diagnosis not present

## 2017-05-17 DIAGNOSIS — R278 Other lack of coordination: Secondary | ICD-10-CM | POA: Diagnosis not present

## 2017-05-18 DIAGNOSIS — M79641 Pain in right hand: Secondary | ICD-10-CM | POA: Diagnosis not present

## 2017-05-18 DIAGNOSIS — F0390 Unspecified dementia without behavioral disturbance: Secondary | ICD-10-CM | POA: Diagnosis not present

## 2017-05-18 DIAGNOSIS — R278 Other lack of coordination: Secondary | ICD-10-CM | POA: Diagnosis not present

## 2017-05-18 DIAGNOSIS — M25511 Pain in right shoulder: Secondary | ICD-10-CM | POA: Diagnosis not present

## 2017-05-19 DIAGNOSIS — R278 Other lack of coordination: Secondary | ICD-10-CM | POA: Diagnosis not present

## 2017-05-19 DIAGNOSIS — M79641 Pain in right hand: Secondary | ICD-10-CM | POA: Diagnosis not present

## 2017-05-19 DIAGNOSIS — F0390 Unspecified dementia without behavioral disturbance: Secondary | ICD-10-CM | POA: Diagnosis not present

## 2017-05-19 DIAGNOSIS — M25511 Pain in right shoulder: Secondary | ICD-10-CM | POA: Diagnosis not present

## 2017-05-20 DIAGNOSIS — R278 Other lack of coordination: Secondary | ICD-10-CM | POA: Diagnosis not present

## 2017-05-20 DIAGNOSIS — F0390 Unspecified dementia without behavioral disturbance: Secondary | ICD-10-CM | POA: Diagnosis not present

## 2017-05-20 DIAGNOSIS — M25511 Pain in right shoulder: Secondary | ICD-10-CM | POA: Diagnosis not present

## 2017-05-20 DIAGNOSIS — M79641 Pain in right hand: Secondary | ICD-10-CM | POA: Diagnosis not present

## 2017-05-23 ENCOUNTER — Non-Acute Institutional Stay (SKILLED_NURSING_FACILITY): Payer: Medicare Other | Admitting: Internal Medicine

## 2017-05-23 DIAGNOSIS — E039 Hypothyroidism, unspecified: Secondary | ICD-10-CM | POA: Diagnosis not present

## 2017-05-23 DIAGNOSIS — D509 Iron deficiency anemia, unspecified: Secondary | ICD-10-CM

## 2017-05-23 DIAGNOSIS — R05 Cough: Secondary | ICD-10-CM | POA: Diagnosis not present

## 2017-05-23 DIAGNOSIS — R062 Wheezing: Secondary | ICD-10-CM | POA: Diagnosis not present

## 2017-05-23 DIAGNOSIS — M25511 Pain in right shoulder: Secondary | ICD-10-CM | POA: Diagnosis not present

## 2017-05-23 DIAGNOSIS — R278 Other lack of coordination: Secondary | ICD-10-CM | POA: Diagnosis not present

## 2017-05-23 DIAGNOSIS — R059 Cough, unspecified: Secondary | ICD-10-CM

## 2017-05-23 DIAGNOSIS — F0390 Unspecified dementia without behavioral disturbance: Secondary | ICD-10-CM | POA: Diagnosis not present

## 2017-05-23 DIAGNOSIS — M79641 Pain in right hand: Secondary | ICD-10-CM | POA: Diagnosis not present

## 2017-05-23 NOTE — Progress Notes (Signed)
This is an acute visit.  Level care skilled.  Facility is [facility].  Chief complaint-acute visit secondary to complaints of cough-wheezing.  History of present illness.  Patient is a pleasant 83 year old female seen today for family concerns of cough and some wheezing.  She is a long-term resident of the facility has a history of dementia hypothyroidism hypertension and anemia.  She has a very supportive family who comes in and feeds her daily.  She does have a history of some pneumonia.  In the past has had some cough and nasal drainage treated with Mucinex and Flonase.  Apparently last day or 2 she's had increased wheezing according to her daughter-also they've noticed a nonproductive cough.  Vital signs appear to be stable she is afebrile O2 sats rations are 94% on room air.  She does have when necessary duo nebs.  Patient is a poor story and secondary to dementia but she is not complaining of any pain or shortness of breath currently family however has noticed cough and apparently some wheezing.  I did speak with her nursing tech as well who is not really noted much wheezing today apparently this was somewhat worse yesterday       Past Medical History:  Diagnosis Date  . Anemia   . Arthritis   . Bradycardia   . Cancer [facility])    had breast cancer 8 yrs ago  . 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 GERD (gastroesophageal reflux disease)   . HCAP (healthcare-associated pneumonia)   . Hypertension   . Hypokalemia   . Hypomagnesemia   . Osteoarthritis   . Pancreatitis, acute 05/03/2015  . Thyroid disease   . Urinary tract infection 02/07/16   Coag negative staph. Nitrofurantoin prescribed   . UTI (lower urinary tract infection) 04/03/2016   Proteus treated with Septra DS        Past Surgical History:  Procedure Laterality Date  . BREAST SURGERY    . ERCP N/A 01/22/2016   Procedure: ENDOSCOPIC RETROGRADE  CHOLANGIOPANCREATOGRAPHY (ERCP) With biliary sphincterotomy and stone extraction ;  Surgeon: Rogene Houston, MD;  Location: [facility];  Service: Endoscopy;  Laterality: N/A;  . ESOPHAGOGASTRODUODENOSCOPY N/A 05/07/2015   Procedure: ESOPHAGOGASTRODUODENOSCOPY (EGD);  Surgeon: Rogene Houston, MD;  Location: [facility];  Service: Endoscopy;  Laterality: N/A;        Allergies  Allergen Reactions  . Codeine Nausea And Vomiting  . Nsaids          8  Medication Sig  . acetaminophen (TYLENOL) 325 MG tablet Take 650 mg by mouth every 6 (six) hours as needed.  Roseanne Kaufman Peru-Castor Oil (VENELEX) OINT Apply to sacrum and bilateral buttock every shift and as needed for prevention  . Cholecalciferol 1000 units TBDP Take 1 tablet by mouth daily  . cloNIDine (CATAPRES) 0.1 MG tablet Take 0.1 mg by mouth 2 (two) times daily.  Marland Kitchen docusate sodium (COLACE) 100 MG capsule Take 100 mg by mouth every morning.  . ferrous sulfate (FERROUSUL) 325 (65 FE) MG tablet Take 325 mg by mouth daily with breakfast.  . ipratropium-albuterol (DUONEB) 0.5-2.5 (3) MG/3ML SOLN Take 3 mLs by nebulization every 6 (six) hours as needed.  Marland Kitchen levothyroxine (SYNTHROID, LEVOTHROID) 175 MCG tablet Take 175 mcg by mouth daily before breakfast.  . losartan (COZAAR) 50 MG tablet Take 50 mg by mouth daily.  . Multiple Vitamins-Minerals (MULTIVITAMIN WITH MINERALS) tablet Take 1 tablet by mouth daily.  Marland Kitchen  polyethylene glycol (MIRALAX / GLYCOLAX) packet Take 17 g by mouth every evening.  . polyvinyl alcohol (LIQUIFILM TEARS) 1.4 % ophthalmic solution Place 1 drop into both eyes every 4 (four) hours.   . ranitidine (ZANTAC) 150 MG tablet Take 150 mg by mouth daily.  . simvastatin (ZOCOR) 10 MG tablet Take 10 mg by mouth at bedtime.  . traMADol (ULTRAM) 50 MG tablet Take 2 tablets (100 mg total) by mouth at bedtime.  . [DISCONTINUED] omeprazole (PRILOSEC) 20 MG capsule Take 20 mg by mouth daily.   No facility-administered encounter  medications on file as of 05/03/2017.      Review of Systems  Unable to perform ROS: Dementia --Please see history of present illness-       Immunization History  Administered Date(s) Administered  . Influenza,inj,Quad PF,6+ Mos 05/04/2015  . Influenza-Unspecified 05/28/2016  . Pneumococcal-Unspecified 06/10/2016       Pertinent  Health Maintenance Due  Topic Date Due  . INFLUENZA VACCINE  07/30/2017 (Originally 03/30/2017)  . DEXA SCAN  07/30/2017 (Originally 11/30/1988)  . PNA vac Low Risk Adult (2 of 2 - PCV13) 06/10/2017   No flowsheet data found. Functional Status Survey:  Physical exam.  Temperature 98.2-pulse 80-respirations 18-blood pressure 121/65-O2 saturation 94% on room air.  In general this is a somewhat frail elderly female in no distress resting comfortably in bed.  Her skin is warm and dry she is not diaphoretic.  Her oropharynx is clear mucous membranes appear moist.  Eyes sclera and conjunctivae appear relatively clear visual acuity appears at baseline. Could not really appreciate significant drainage  Nose could not really appreciate any active drainage at this time.  Chest-is clear to auscultation cannot really appreciate wheezing or congestion this evening there is no labored breathing  Heart is regular rate and rhythm with an occasional irregular beat she has a slight systolic murmur.  Lower extremity appear baseline --more on the right versus the left.   Her abdomen is soft nontender with positive bowel sounds.  Muscle- skeletal has generalized weakness has a contracture of her fourth right finger in therapy is working with her on this-  Has some stiffness weakness of her lower extremities which is not new as well as some stiffness of her right upper extremity.  Neurologic is grossly intact cannot really appreciate lateralizing findings her cranial nerves appear to be intact.  Has a history of intermittent upper extremity tremors do not  really note that much this evening.  Psych has history of dementia but is apparently eating well with strong family support she is pleasant smiling and does follow simple verbal commands  Labs.  03/09/2017.  WBC 5.0 hemoglobin 10.0 platelets 307  03/02/2017-BNP was 178  Sodium 136 potassium 3.9 BUN 25 creatinine 0.79  01/25/2017.  Albumin was 3.0 ALT 11 otherwise liver function tests within normal limits   Assessment plan.  #1 cough-wheezing-clinically she appears stable but does have a history of pneumonia in the past which was significant-family is concerned and will order a chest x-ray 2 view to rule out any possible acute etiology pneumonia etc.  Also will restart Mucinex 500 mg twice a day for 5 days.  I do not really note drainage or allergic rhinitis symptoms at this time but will have to be monitored.  Will order vital signs pulse ox every shift for 48 hours  She continues with duo nebs when necessary as well.  #2 anemia this appears stable on iron-hemoglobin was 10.0 on lab 03/09/2017-will update  this for updated values  Also since it has been approxi-3 months since renal function was tested will update this as well this appears to have had stability with a creatinine of 0.79 BUN of 25 on lab done last July.  #3 hypothyroidism she is on Synthroid 175  micrograms a day TSH was within normal limits at 1.813 on lab done in March 2018 will update this as well since it has been approximately-6 months since it was last tested  [phone]

## 2017-05-24 ENCOUNTER — Encounter: Payer: Self-pay | Admitting: Internal Medicine

## 2017-05-24 ENCOUNTER — Non-Acute Institutional Stay (SKILLED_NURSING_FACILITY): Payer: Medicare Other | Admitting: Internal Medicine

## 2017-05-24 ENCOUNTER — Encounter (HOSPITAL_COMMUNITY)
Admission: RE | Admit: 2017-05-24 | Discharge: 2017-05-24 | Disposition: A | Discharge status: Home / Self Care | Payer: Medicare Other | Source: Skilled Nursing Facility | Attending: Internal Medicine | Admitting: Internal Medicine

## 2017-05-24 DIAGNOSIS — Z48815 Encounter for surgical aftercare following surgery on the digestive system: Secondary | ICD-10-CM | POA: Diagnosis not present

## 2017-05-24 DIAGNOSIS — D509 Iron deficiency anemia, unspecified: Secondary | ICD-10-CM | POA: Insufficient documentation

## 2017-05-24 DIAGNOSIS — R262 Difficulty in walking, not elsewhere classified: Secondary | ICD-10-CM | POA: Diagnosis not present

## 2017-05-24 DIAGNOSIS — R17 Unspecified jaundice: Secondary | ICD-10-CM | POA: Insufficient documentation

## 2017-05-24 DIAGNOSIS — R9389 Abnormal findings on diagnostic imaging of other specified body structures: Secondary | ICD-10-CM

## 2017-05-24 DIAGNOSIS — R938 Abnormal findings on diagnostic imaging of other specified body structures: Secondary | ICD-10-CM

## 2017-05-24 DIAGNOSIS — I10 Essential (primary) hypertension: Secondary | ICD-10-CM | POA: Diagnosis not present

## 2017-05-24 DIAGNOSIS — R062 Wheezing: Secondary | ICD-10-CM | POA: Diagnosis not present

## 2017-05-24 DIAGNOSIS — R05 Cough: Secondary | ICD-10-CM

## 2017-05-24 DIAGNOSIS — R059 Cough, unspecified: Secondary | ICD-10-CM

## 2017-05-24 DIAGNOSIS — E039 Hypothyroidism, unspecified: Secondary | ICD-10-CM | POA: Insufficient documentation

## 2017-05-24 DIAGNOSIS — M79641 Pain in right hand: Secondary | ICD-10-CM | POA: Diagnosis not present

## 2017-05-24 DIAGNOSIS — N390 Urinary tract infection, site not specified: Secondary | ICD-10-CM | POA: Insufficient documentation

## 2017-05-24 DIAGNOSIS — E785 Hyperlipidemia, unspecified: Secondary | ICD-10-CM | POA: Diagnosis not present

## 2017-05-24 DIAGNOSIS — R278 Other lack of coordination: Secondary | ICD-10-CM | POA: Diagnosis not present

## 2017-05-24 DIAGNOSIS — M25511 Pain in right shoulder: Secondary | ICD-10-CM | POA: Diagnosis not present

## 2017-05-24 DIAGNOSIS — F0390 Unspecified dementia without behavioral disturbance: Secondary | ICD-10-CM | POA: Diagnosis not present

## 2017-05-24 LAB — CBC WITH DIFFERENTIAL/PLATELET
Basophils Absolute: 0 10*3/uL (ref 0.0–0.1)
Basophils Relative: 1 %
EOS ABS: 0.3 10*3/uL (ref 0.0–0.7)
Eosinophils Relative: 5 %
HEMATOCRIT: 31.1 % — AB (ref 36.0–46.0)
HEMOGLOBIN: 9.9 g/dL — AB (ref 12.0–15.0)
LYMPHS ABS: 2.1 10*3/uL (ref 0.7–4.0)
Lymphocytes Relative: 40 %
MCH: 28.7 pg (ref 26.0–34.0)
MCHC: 31.8 g/dL (ref 30.0–36.0)
MCV: 90.1 fL (ref 78.0–100.0)
MONOS PCT: 20 %
Monocytes Absolute: 1 10*3/uL (ref 0.1–1.0)
NEUTROS ABS: 1.8 10*3/uL (ref 1.7–7.7)
NEUTROS PCT: 34 %
Platelets: 224 10*3/uL (ref 150–400)
RBC: 3.45 MIL/uL — AB (ref 3.87–5.11)
RDW: 13.7 % (ref 11.5–15.5)
WBC: 5.1 10*3/uL (ref 4.0–10.5)

## 2017-05-24 LAB — BASIC METABOLIC PANEL
ANION GAP: 8 (ref 5–15)
BUN: 27 mg/dL — ABNORMAL HIGH (ref 6–20)
CHLORIDE: 104 mmol/L (ref 101–111)
CO2: 26 mmol/L (ref 22–32)
CREATININE: 0.82 mg/dL (ref 0.44–1.00)
Calcium: 8.8 mg/dL — ABNORMAL LOW (ref 8.9–10.3)
GFR calc non Af Amer: 60 mL/min — ABNORMAL LOW (ref >60)
Glucose, Bld: 87 mg/dL (ref 65–99)
Potassium: 4 mmol/L (ref 3.5–5.1)
SODIUM: 138 mmol/L (ref 135–145)

## 2017-05-24 LAB — TSH: TSH: 1.78 u[IU]/mL (ref 0.350–4.500)

## 2017-05-24 NOTE — Progress Notes (Signed)
Location:   [place] Room Number: 157/W Place of Service:  SNF [phone]) Provider:  [name], MD  Patient Care Team: [name], MD as PCP - General (Internal Medicine)  Extended Emergency Contact Information Primary Emergency Contact: [name] Address: [address]          [place],  [place] Home Phone: [phone] Relation: None Secondary Emergency Contact: [place], [place] [address] [name] of [place] Phone: [phone] Relation: Grandson  Code Status:  DNR Goals of care: Advanced Directive information Advanced Directives 05/24/2017  Does Patient Have a Medical Advance Directive? Yes  Type of Advance Directive Out of facility DNR (pink MOST or yellow form)  Does patient want to make changes to medical advance directive? No - Patient declined  Copy of [place] in Chart? -  Would patient like information on creating a medical advance directive? -  Pre-existing out of facility DNR order (yellow form or pink MOST form) -     Chief Complaint  Patient presents with  . Acute Visit    F/U Chest X-ray  With history of cough.    HPI:  Pt is a 83 y.o. female seen today for an acute visit for follow-up of cough-with chest x-ray done earlier today. Patient's long-term resident of the facility with history dementia hypothyroidism hypertension as well as anemia.  She also has some history of pneumonia in the past.  She developed a cough apparently last couple days family was concerned and we did order an x-ray which we have obtained the results-it does not really show evidence of pneumonia-show evidence of COPD chronic-.  Interestingly showed an abnormal soft tissue prominence on the right paratracheal region measuring 4.9 x 2.8 cm.  Clinically she appears to be doing well today her daughter who is very attentive is here this evening and has only noticed her coughing  once-she is eating her dinner now and appears to have a very good appetite -- She is afebrile O2 sats rations are in the 90s on room air she was started on Mucinex yesterday.  There've been no complaints of shortness of breath or respiratory discomfort.      Past Medical History:  Diagnosis Date  . Anemia   . Arthritis   . Bradycardia   . Cancer [facility])    had breast cancer 8 yrs ago  . 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 GERD (gastroesophageal reflux disease)   . HCAP (healthcare-associated pneumonia)   . Hypertension   . Hypokalemia   . Hypomagnesemia   . Osteoarthritis   . Pancreatitis, acute 05/03/2015  . Thyroid disease   . Urinary tract infection 02/07/16   Coag negative staph. Nitrofurantoin prescribed   . UTI (lower urinary tract infection) 04/03/2016   Proteus treated with Septra DS   Past Surgical History:  Procedure Laterality Date  . BREAST SURGERY    . ERCP N/A 01/22/2016   Procedure: ENDOSCOPIC RETROGRADE CHOLANGIOPANCREATOGRAPHY (ERCP) With biliary sphincterotomy and stone extraction ;  Surgeon: Rogene Houston, MD;  Location: [facility];  Service: Endoscopy;  Laterality: N/A;  . ESOPHAGOGASTRODUODENOSCOPY N/A 05/07/2015   Procedure: ESOPHAGOGASTRODUODENOSCOPY (EGD);  Surgeon: Rogene Houston, MD;  Location: [facility];  Service: Endoscopy;  Laterality: N/A;    Allergies  Allergen Reactions  . Codeine Nausea And Vomiting  . Nsaids     Outpatient  Encounter Prescriptions as of 05/24/2017  Medication Sig  . acetaminophen (TYLENOL) 325 MG tablet Take 650 mg by mouth every 6 (six) hours as needed.  Roseanne Kaufman Peru-Castor Oil (VENELEX) OINT Apply to sacrum and bilateral buttock every shift and as needed for prevention  . carboxymethylcellulose (REFRESH PLUS) 0.5 % SOLN Place 1 drop into both eyes 4 (four) times daily.  . Cholecalciferol 1000 units TBDP Take 1 tablet by mouth daily  . cloNIDine (CATAPRES) 0.1 MG tablet Take 0.1 mg by mouth 2  (two) times daily.  Marland Kitchen docusate sodium (COLACE) 100 MG capsule Take 100 mg by mouth every morning.  . ferrous sulfate (FERROUSUL) 325 (65 FE) MG tablet Take 325 mg by mouth daily with breakfast.  . guaiFENesin (MUCINEX) 600 MG 12 hr tablet Take 600 mg by mouth 2 (two) times daily.  Marland Kitchen ipratropium-albuterol (DUONEB) 0.5-2.5 (3) MG/3ML SOLN Take 3 mLs by nebulization every 6 (six) hours as needed.  Marland Kitchen levothyroxine (SYNTHROID, LEVOTHROID) 175 MCG tablet Take 175 mcg by mouth daily before breakfast.  . losartan (COZAAR) 50 MG tablet Take 50 mg by mouth daily.  . Multiple Vitamins-Minerals (MULTIVITAMIN WITH MINERALS) tablet Take 1 tablet by mouth daily.  . polyethylene glycol (MIRALAX / GLYCOLAX) packet Take 17 g by mouth every evening.  . ranitidine (ZANTAC) 150 MG tablet Take 150 mg by mouth daily.  . simvastatin (ZOCOR) 10 MG tablet Take 10 mg by mouth at bedtime.  . traMADol (ULTRAM) 50 MG tablet Take 2 tablets (100 mg total) by mouth at bedtime.  . [DISCONTINUED] polyvinyl alcohol (LIQUIFILM TEARS) 1.4 % ophthalmic solution Place 1 drop into both eyes every 4 (four) hours.    No facility-administered encounter medications on file as of 05/24/2017.     Review of Systems   This is limited secondary to dementia please see history of present illness again cough apparently has improved she is not complaining of any shortness of breath or difficulty swallowing is eating well this evening  Immunization History  Administered Date(s) Administered  . Influenza,inj,Quad PF,6+ Mos 05/04/2015  . Influenza-Unspecified 05/28/2016  . Pneumococcal-Unspecified 06/10/2016   Pertinent  Health Maintenance Due  Topic Date Due  . INFLUENZA VACCINE  07/30/2017 (Originally 03/30/2017)  . PNA vac Low Risk Adult (2 of 2 - PCV13) 06/10/2017  . DEXA SCAN  Completed   Fall Risk  05/09/2017  Falls in the past year? No   Functional Status Survey:    Vitals:   05/24/17 1419  BP: (!) 145/80  Pulse: (!) 58  Resp:  20  Temp: 98.1 F (36.7 C)  TempSrc: Oral  SpO2: 98%    Physical Exam In general this is a pleasant elderly female in no distress sitting comfortably in her wheelchair eating her dinner.  Her skin is warm and dry not diaphoretic.  Oropharynx is clear mucous membranes appear moist limited exam since she is eating and there is some food residue.  Neck-could not appreciate any acute abnormalities but limited exam since patient holds her neck and somewhat of a contracted position at times  Nose could not really appreciate any significant drainage.  Chest continues to be clear to auscultation there is no labored breathing.   Heart is regular rate and rhythm with a slight systolic murmur  she appears have baseline lower extremity edema  Psych she does have a history of dementia but is cooperative with simple verbal commands and follow them without difficulty this evening appears to be feeling relatively well  Labs reviewed:  Recent Labs  01/25/17 0715 03/02/17 0430 05/24/17 0400  NA 139 136 138  K 3.5 3.9 4.0  CL 105 101 104  CO2 26 25 26   GLUCOSE 99 95 87  BUN 36* 25* 27*  CREATININE 0.86 0.79 0.82  CALCIUM 8.8* 8.5* 8.8*    Recent Labs  09/01/16 1700 12/27/16 0900 01/25/17 0715  AST 18 20 19   ALT 12* 11* 11*  ALKPHOS 47 47 48  BILITOT 0.2* 0.2* 0.5  PROT 7.2 7.2 7.2  ALBUMIN 2.8* 3.0* 3.0*    Recent Labs  03/02/17 0430 03/09/17 0800 05/24/17 0400  WBC 4.9 5.0 5.1  NEUTROABS 1.9 2.5 1.8  HGB 10.1* 10.0* 9.9*  HCT 31.0* 30.9* 31.1*  MCV 87.6 88.3 90.1  PLT 268 307 224   Lab Results  Component Value Date   TSH 1.780 05/24/2017   No results found for: HGBA1C Lab Results  Component Value Date   CHOL 118 12/27/2016   HDL 47 12/27/2016   LDLCALC 53 12/27/2016   TRIG 92 12/27/2016   CHOLHDL 2.5 12/27/2016    Significant Diagnostic Results in last 30 days:  No results found.  Assessment/Plan  Assessment and plan.  Cough-this appears to have  improved on the Mucinex chest x-ray did not really show any acute process at this point will monitor she appears to be doing well this evening.  #2 history of abnormal finding on chest x-ray of soft tissue prominence paratracheal area-I discussed this with Dr. Lyndel Safe as well as with her daughter at bedside-will order an updated e x-ray in a proximally 4 weeks for follow-up clinically she appears stable and asymptomatic of anything acute here. Her daughter is in agreement with the follow-up chest x-ray   [phone]

## 2017-05-25 DIAGNOSIS — R278 Other lack of coordination: Secondary | ICD-10-CM | POA: Diagnosis not present

## 2017-05-25 DIAGNOSIS — B351 Tinea unguium: Secondary | ICD-10-CM | POA: Diagnosis not present

## 2017-05-25 DIAGNOSIS — M25511 Pain in right shoulder: Secondary | ICD-10-CM | POA: Diagnosis not present

## 2017-05-25 DIAGNOSIS — I739 Peripheral vascular disease, unspecified: Secondary | ICD-10-CM | POA: Diagnosis not present

## 2017-05-25 DIAGNOSIS — M79675 Pain in left toe(s): Secondary | ICD-10-CM | POA: Diagnosis not present

## 2017-05-25 DIAGNOSIS — F0390 Unspecified dementia without behavioral disturbance: Secondary | ICD-10-CM | POA: Diagnosis not present

## 2017-05-25 DIAGNOSIS — M79641 Pain in right hand: Secondary | ICD-10-CM | POA: Diagnosis not present

## 2017-05-25 DIAGNOSIS — M79674 Pain in right toe(s): Secondary | ICD-10-CM | POA: Diagnosis not present

## 2017-05-26 ENCOUNTER — Encounter: Payer: Self-pay | Admitting: Internal Medicine

## 2017-05-26 ENCOUNTER — Non-Acute Institutional Stay (SKILLED_NURSING_FACILITY): Payer: Medicare Other | Admitting: Internal Medicine

## 2017-05-26 DIAGNOSIS — E785 Hyperlipidemia, unspecified: Secondary | ICD-10-CM

## 2017-05-26 DIAGNOSIS — E559 Vitamin D deficiency, unspecified: Secondary | ICD-10-CM | POA: Diagnosis not present

## 2017-05-26 DIAGNOSIS — I10 Essential (primary) hypertension: Secondary | ICD-10-CM

## 2017-05-26 DIAGNOSIS — R05 Cough: Secondary | ICD-10-CM | POA: Diagnosis not present

## 2017-05-26 DIAGNOSIS — M25511 Pain in right shoulder: Secondary | ICD-10-CM | POA: Diagnosis not present

## 2017-05-26 DIAGNOSIS — R059 Cough, unspecified: Secondary | ICD-10-CM

## 2017-05-26 DIAGNOSIS — F0281 Dementia in other diseases classified elsewhere with behavioral disturbance: Secondary | ICD-10-CM | POA: Diagnosis not present

## 2017-05-26 DIAGNOSIS — M79641 Pain in right hand: Secondary | ICD-10-CM | POA: Diagnosis not present

## 2017-05-26 DIAGNOSIS — G309 Alzheimer's disease, unspecified: Secondary | ICD-10-CM

## 2017-05-26 DIAGNOSIS — R278 Other lack of coordination: Secondary | ICD-10-CM | POA: Diagnosis not present

## 2017-05-26 DIAGNOSIS — D509 Iron deficiency anemia, unspecified: Secondary | ICD-10-CM | POA: Diagnosis not present

## 2017-05-26 DIAGNOSIS — F0390 Unspecified dementia without behavioral disturbance: Secondary | ICD-10-CM | POA: Diagnosis not present

## 2017-05-26 NOTE — Progress Notes (Signed)
Location:   [place] Room Number: 157/W Place of Service:  SNF (31) Provider:  [name], MD  Patient Care Team: [name], MD as PCP - General (Internal Medicine)  Extended Emergency Contact Information Primary Emergency Contact: [name] Address: [address]          [place],  [place] Home Phone: [phone] Relation: None Secondary Emergency Contact: [place], [place] [address] [name] of [place] Phone: [phone] Relation: Grandson  Code Status:  DNR Goals of care: Advanced Directive information Advanced Directives 05/26/2017  Does Patient Have a Medical Advance Directive? Yes  Type of Advance Directive Out of facility DNR (pink MOST or yellow form)  Does patient want to make changes to medical advance directive? No - Patient declined  Copy of [place] in Chart? -  Would patient like information on creating a medical advance directive? -  Pre-existing out of facility DNR order (yellow form or pink MOST form) -     Chief Complaint  Patient presents with  . Medical Management of Chronic Issues    Routine Visit  Her medical management of chronic medical conditions including dementia-hypothyroidism-hypertension-anemia-history of cough or URI-GERD-vitamin D deficiency-hyperlipidemia  HPI:  Pt is a 83 y.o. female seen today for medical management of chronic diseases. As noted above.  Jo Hardin actually has been quite stable recently saw her for some increased cough chest x-ray did not really show any acute process but did show a paratracheal s soft tissue prominence on the right.  This was discussed with Dr. Lyndel Safe as well as family and will obtain a follow-up x-ray in about 4 weeks.  Jo Hardin does not appear to be overtly symptomatic.  Regards cough this appears to have gotten somewhat better Jo Hardin is completing course of Mucinex also has duo nebs when necessary   \-In  regards to dementia Jo Hardin is doing quite well with supportive care over the past several months Jo Hardin is actually gaining a significant amount of weight proximally 20 pounds-Jo Hardin gets strong family support who helps feeding her her weight appears to have stabilized recently at around 140 pounds.  Regards to hypertension this appears relatively well controlled on clonidine and Cozaar recent blood pressures 144/75 126/75. Her run in this range I do not see consistent elevations above 458 systolically.  Jo Hardin also has a history of iron deficiency anemia hemoglobin is stable at 9.9 on lab done earlier this week-Jo Hardin is on iron.  Regards hypothyroidism Jo Hardin is on Synthroid TSH was normal on lab done earlier this week at 1.78.  Jo Hardin also has a history of hyperlipidemia Jo Hardin is on Zocor LDL was 53 on lab done in late April 2018.  Currently Jo Hardin is resting in bed comfortably Jo Hardin has no acute complaints Jo Hardin does have a contracture of her right fourth finger in therapy is working with this-at times will complain of sore legs but I do not note any open areas suspect this is somewhat arthritic and Jo Hardin does have tramadol at night which apparently does help   Past Medical History:  Diagnosis Date  . Anemia   . Arthritis   . Bradycardia   . Cancer [facility])    had breast cancer 8 yrs ago  . 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 GERD (gastroesophageal reflux disease)   . HCAP (healthcare-associated pneumonia)   . Hypertension   . Hypokalemia   .  Hypomagnesemia   . Osteoarthritis   . Pancreatitis, acute 05/03/2015  . Thyroid disease   . Urinary tract infection 02/07/16   Coag negative staph. Nitrofurantoin prescribed   . UTI (lower urinary tract infection) 04/03/2016   Proteus treated with Septra DS   Past Surgical History:  Procedure Laterality Date  . BREAST SURGERY    . ERCP N/A 01/22/2016   Procedure: ENDOSCOPIC RETROGRADE CHOLANGIOPANCREATOGRAPHY (ERCP) With biliary sphincterotomy and  stone extraction ;  Surgeon: Rogene Houston, MD;  Location: [facility] ORS;  Service: Endoscopy;  Laterality: N/A;  . ESOPHAGOGASTRODUODENOSCOPY N/A 05/07/2015   Procedure: ESOPHAGOGASTRODUODENOSCOPY (EGD);  Surgeon: Rogene Houston, MD;  Location: [facility] ENDO SUITE;  Service: Endoscopy;  Laterality: N/A;    Allergies  Allergen Reactions  . Codeine Nausea And Vomiting  . Nsaids     Outpatient Encounter Prescriptions as of 05/26/2017  Medication Sig  . acetaminophen (TYLENOL) 325 MG tablet Take 650 mg by mouth every 6 (six) hours as needed.  Roseanne Kaufman Peru-Castor Oil (VENELEX) OINT Apply to sacrum and bilateral buttock every shift and as needed for prevention  . carboxymethylcellulose (REFRESH PLUS) 0.5 % SOLN Place 1 drop into both eyes 4 (four) times daily.  . Cholecalciferol 1000 units TBDP Take 1 tablet by mouth daily  . cloNIDine (CATAPRES) 0.1 MG tablet Take 0.1 mg by mouth 2 (two) times daily.  Marland Kitchen docusate sodium (COLACE) 100 MG capsule Take 100 mg by mouth every morning.  . ferrous sulfate (FERROUSUL) 325 (65 FE) MG tablet Take 325 mg by mouth daily with breakfast.  . guaiFENesin (MUCINEX) 600 MG 12 hr tablet Take 600 mg by mouth 2 (two) times daily.  Marland Kitchen ipratropium-albuterol (DUONEB) 0.5-2.5 (3) MG/3ML SOLN Take 3 mLs by nebulization every 6 (six) hours as needed.  Marland Kitchen levothyroxine (SYNTHROID, LEVOTHROID) 175 MCG tablet Take 175 mcg by mouth daily before breakfast.  . losartan (COZAAR) 50 MG tablet Take 50 mg by mouth daily.  . Multiple Vitamins-Minerals (MULTIVITAMIN WITH MINERALS) tablet Take 1 tablet by mouth daily.  . polyethylene glycol (MIRALAX / GLYCOLAX) packet Take 17 g by mouth every evening.  . ranitidine (ZANTAC) 150 MG tablet Take 150 mg by mouth daily.  . simvastatin (ZOCOR) 10 MG tablet Take 10 mg by mouth at bedtime.  . traMADol (ULTRAM) 50 MG tablet Take 2 tablets (100 mg total) by mouth at bedtime.   No facility-administered encounter medications on file as of 05/26/2017.       Review of Systems   This is limited secondary to dementia again Jo Hardin has no acute complaints tonight cough apparently is improved is not really complaining of pain but will complain of "sore legs" at times.  Nursing staff does not report any acute issues  Immunization History  Administered Date(s) Administered  . Influenza,inj,Quad PF,6+ Mos 05/04/2015  . Influenza-Unspecified 05/28/2016  . Pneumococcal-Unspecified 06/10/2016   Pertinent  Health Maintenance Due  Topic Date Due  . INFLUENZA VACCINE  07/30/2017 (Originally 03/30/2017)  . PNA vac Low Risk Adult (2 of 2 - PCV13) 06/10/2017  . DEXA SCAN  Completed   Fall Risk  05/09/2017  Falls in the past year? No   Functional Status Survey:    Vitals:   05/26/17 1401  BP: (!) 144/75  Pulse: (!) 58  Resp: 18  Temp: 97.9 F (36.6 C)  TempSrc: Oral  SpO2: 98%  Weight: 139 lb 12.8 oz (63.4 kg)  Height: 5\' 4"  (1.626 m)  Body mass index is 24 kg/m. Physical  Exam in general this is a pleasant elderly female in no distress lying comfortably in bed.  Skin is warm and dry do not note any diaphoresis or rashes.  Oropharynx continues to be clear mucous membranes moist.  Eyes visual acuity appears to be grossly intact I do not note any drainage this evening.  Chest is clear to auscultation there is no labored breathing somewhat shallow air entry.  Heart is regular rate and rhythm with a quite slight systolic murmur-Jo Hardin has mild lower extremity edema this appears baseline pedal pulses are intact bilaterally.  Abdomen is soft nontender with active bowel sounds.  Muscle skeletal continues with lower extremity weakness which is not new upper extremity strength appears to be intact Jo Hardin does have that contracture of her right fourth finger.  Other than arthritic changes do not note any significant findings.  Neurologic is grossly intact no true lateralizing findings her speech is clear Stalking some this evening.  Psych Jo Hardin  is oriented to self actually is talking more than I have seen her in the past.  -- Labs reviewed:  Recent Labs  01/25/17 0715 03/02/17 0430 05/24/17 0400  NA 139 136 138  K 3.5 3.9 4.0  CL 105 101 104  CO2 26 25 26   GLUCOSE 99 95 87  BUN 36* 25* 27*  CREATININE 0.86 0.79 0.82  CALCIUM 8.8* 8.5* 8.8*    Recent Labs  09/01/16 1700 12/27/16 0900 01/25/17 0715  AST 18 20 19   ALT 12* 11* 11*  ALKPHOS 47 47 48  BILITOT 0.2* 0.2* 0.5  PROT 7.2 7.2 7.2  ALBUMIN 2.8* 3.0* 3.0*    Recent Labs  03/02/17 0430 03/09/17 0800 05/24/17 0400  WBC 4.9 5.0 5.1  NEUTROABS 1.9 2.5 1.8  HGB 10.1* 10.0* 9.9*  HCT 31.0* 30.9* 31.1*  MCV 87.6 88.3 90.1  PLT 268 307 224   Lab Results  Component Value Date   TSH 1.780 05/24/2017   No results found for: HGBA1C Lab Results  Component Value Date   CHOL 118 12/27/2016   HDL 47 12/27/2016   LDLCALC 53 12/27/2016   TRIG 92 12/27/2016   CHOLHDL 2.5 12/27/2016    Significant Diagnostic Results in last 30 days:  No results found.  Assessment/Plan  #1 dementia-Jo Hardin appears to be doing very well with supportive care Jo Hardin has had significant weight gain which is stabilized Jo Hardin has a very supportive family at this point continue supportive care.  #2 history of cough with URIs at times Jo Hardin's been treated for pneumonia in the past however recent chest x-ray did not show any acute process Jo Hardin is completing a course of Mucinex also is due nebs when necessary.  #3 for incidental finding on x-ray of paratracheal soft tissue prominence again update x-ray has been ordered an approximate 4 weeks.  #4-history of hypertension again Jo Hardin has variable systolics but I do not see consistent elevations as noted well continues on clonidine as well as Cozaar.  #5 history of iron deficiency anemia Jo Hardin is on iron and hemoglobin is stable at 9.9 on lab done earlier this week.  #6 history of GERD this appears relatively asymptomatic Jo Hardin is on Zantac nursing  her family has made any complaints about difficulty swallowing or burning.  #7 history vitamin D deficiency Jo Hardin is on supplementation level was 48.1 on lab done 8 2018.  #8 history of hyperlipidemia Jo Hardin is on Zocor LDL was 53 on lab done in late April 2018.  #9  history hypothyroidism Jo Hardin  is on Synthroid TSH was within normal range at 1.78 on lab done earlier this week     [id number]

## 2017-05-27 DIAGNOSIS — R278 Other lack of coordination: Secondary | ICD-10-CM | POA: Diagnosis not present

## 2017-05-27 DIAGNOSIS — M25511 Pain in right shoulder: Secondary | ICD-10-CM | POA: Diagnosis not present

## 2017-05-27 DIAGNOSIS — F0390 Unspecified dementia without behavioral disturbance: Secondary | ICD-10-CM | POA: Diagnosis not present

## 2017-05-27 DIAGNOSIS — M79641 Pain in right hand: Secondary | ICD-10-CM | POA: Diagnosis not present

## 2017-05-31 DIAGNOSIS — M79641 Pain in right hand: Secondary | ICD-10-CM | POA: Diagnosis not present

## 2017-05-31 DIAGNOSIS — F0390 Unspecified dementia without behavioral disturbance: Secondary | ICD-10-CM | POA: Diagnosis not present

## 2017-05-31 DIAGNOSIS — R278 Other lack of coordination: Secondary | ICD-10-CM | POA: Diagnosis not present

## 2017-05-31 DIAGNOSIS — M25511 Pain in right shoulder: Secondary | ICD-10-CM | POA: Diagnosis not present

## 2017-06-01 DIAGNOSIS — R278 Other lack of coordination: Secondary | ICD-10-CM | POA: Diagnosis not present

## 2017-06-01 DIAGNOSIS — F0390 Unspecified dementia without behavioral disturbance: Secondary | ICD-10-CM | POA: Diagnosis not present

## 2017-06-01 DIAGNOSIS — M79641 Pain in right hand: Secondary | ICD-10-CM | POA: Diagnosis not present

## 2017-06-01 DIAGNOSIS — M25511 Pain in right shoulder: Secondary | ICD-10-CM | POA: Diagnosis not present

## 2017-06-02 DIAGNOSIS — M25511 Pain in right shoulder: Secondary | ICD-10-CM | POA: Diagnosis not present

## 2017-06-02 DIAGNOSIS — R278 Other lack of coordination: Secondary | ICD-10-CM | POA: Diagnosis not present

## 2017-06-02 DIAGNOSIS — M79641 Pain in right hand: Secondary | ICD-10-CM | POA: Diagnosis not present

## 2017-06-02 DIAGNOSIS — F0390 Unspecified dementia without behavioral disturbance: Secondary | ICD-10-CM | POA: Diagnosis not present

## 2017-06-03 DIAGNOSIS — M79641 Pain in right hand: Secondary | ICD-10-CM | POA: Diagnosis not present

## 2017-06-03 DIAGNOSIS — M25511 Pain in right shoulder: Secondary | ICD-10-CM | POA: Diagnosis not present

## 2017-06-03 DIAGNOSIS — R278 Other lack of coordination: Secondary | ICD-10-CM | POA: Diagnosis not present

## 2017-06-03 DIAGNOSIS — F0390 Unspecified dementia without behavioral disturbance: Secondary | ICD-10-CM | POA: Diagnosis not present

## 2017-06-04 DIAGNOSIS — F0390 Unspecified dementia without behavioral disturbance: Secondary | ICD-10-CM | POA: Diagnosis not present

## 2017-06-04 DIAGNOSIS — M25511 Pain in right shoulder: Secondary | ICD-10-CM | POA: Diagnosis not present

## 2017-06-04 DIAGNOSIS — R278 Other lack of coordination: Secondary | ICD-10-CM | POA: Diagnosis not present

## 2017-06-04 DIAGNOSIS — M79641 Pain in right hand: Secondary | ICD-10-CM | POA: Diagnosis not present

## 2017-06-05 DIAGNOSIS — M25511 Pain in right shoulder: Secondary | ICD-10-CM | POA: Diagnosis not present

## 2017-06-05 DIAGNOSIS — M79641 Pain in right hand: Secondary | ICD-10-CM | POA: Diagnosis not present

## 2017-06-05 DIAGNOSIS — R278 Other lack of coordination: Secondary | ICD-10-CM | POA: Diagnosis not present

## 2017-06-05 DIAGNOSIS — F0390 Unspecified dementia without behavioral disturbance: Secondary | ICD-10-CM | POA: Diagnosis not present

## 2017-06-06 DIAGNOSIS — F0390 Unspecified dementia without behavioral disturbance: Secondary | ICD-10-CM | POA: Diagnosis not present

## 2017-06-06 DIAGNOSIS — M25511 Pain in right shoulder: Secondary | ICD-10-CM | POA: Diagnosis not present

## 2017-06-06 DIAGNOSIS — M79641 Pain in right hand: Secondary | ICD-10-CM | POA: Diagnosis not present

## 2017-06-06 DIAGNOSIS — R278 Other lack of coordination: Secondary | ICD-10-CM | POA: Diagnosis not present

## 2017-06-07 ENCOUNTER — Non-Acute Institutional Stay (SKILLED_NURSING_FACILITY): Payer: Medicare Other | Admitting: Internal Medicine

## 2017-06-07 DIAGNOSIS — M24549 Contracture, unspecified hand: Secondary | ICD-10-CM

## 2017-06-07 DIAGNOSIS — M25511 Pain in right shoulder: Secondary | ICD-10-CM | POA: Diagnosis not present

## 2017-06-07 DIAGNOSIS — R278 Other lack of coordination: Secondary | ICD-10-CM | POA: Diagnosis not present

## 2017-06-07 DIAGNOSIS — F0390 Unspecified dementia without behavioral disturbance: Secondary | ICD-10-CM | POA: Diagnosis not present

## 2017-06-07 DIAGNOSIS — M79641 Pain in right hand: Secondary | ICD-10-CM | POA: Diagnosis not present

## 2017-06-07 NOTE — Progress Notes (Signed)
This is an acute visit    level care skilled.  Facility is [facility]   Date is 06/07/2017.  Chief complaint-acute visit secondary to right fourth finger contracture.  History of present illness.  Patient is then 83 year old female who has a history of a contracture of her right fourth finger.  The area has been x-rayed and does not show any fracture and does show degenerative changes.  Family is concerned thinking that she has some discomfort with the finger.  She does have tramadol at night as well as Tylenol when necessary during the day.  There apparently is been no history of trauma here she appears to have some contractures of her hands which is baseline.       Past Medical History:  Diagnosis Date  . Anemia   . Arthritis   . Bradycardia   . Cancer [facility])    had breast cancer 8 yrs ago  . 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 GERD (gastroesophageal reflux disease)   . HCAP (healthcare-associated pneumonia)   . Hypertension   . Hypokalemia   . Hypomagnesemia   . Osteoarthritis   . Pancreatitis, acute 05/03/2015  . Thyroid disease   . Urinary tract infection 02/07/16   Coag negative staph. Nitrofurantoin prescribed   . UTI (lower urinary tract infection) 04/03/2016   Proteus treated with Septra DS        Past Surgical History:  Procedure Laterality Date  . BREAST SURGERY    . ERCP N/A 01/22/2016   Procedure: ENDOSCOPIC RETROGRADE CHOLANGIOPANCREATOGRAPHY (ERCP) With biliary sphincterotomy and stone extraction ;  Surgeon: Rogene Houston, MD;  Location: [facility] ORS;  Service: Endoscopy;  Laterality: N/A;  . ESOPHAGOGASTRODUODENOSCOPY N/A 05/07/2015   Procedure: ESOPHAGOGASTRODUODENOSCOPY (EGD);  Surgeon: Rogene Houston, MD;  Location: [facility] ENDO SUITE;  Service: Endoscopy;  Laterality: N/A;        Allergies  Allergen Reactions  . Codeine Nausea And Vomiting  . Nsaids          8  Medication Sig  .  acetaminophen (TYLENOL) 325 MG tablet Take 650 mg by mouth every 6 (six) hours as needed.  Roseanne Kaufman Peru-Castor Oil (VENELEX) OINT Apply to sacrum and bilateral buttock every shift and as needed for prevention  . carboxymethylcellulose (REFRESH PLUS) 0.5 % SOLN Place 1 drop into both eyes 4 (four) times daily.  . Cholecalciferol 1000 units TBDP Take 1 tablet by mouth daily  . cloNIDine (CATAPRES) 0.1 MG tablet Take 0.1 mg by mouth 2 (two) times daily.  Marland Kitchen docusate sodium (COLACE) 100 MG capsule Take 100 mg by mouth every morning.  . ferrous sulfate (FERROUSUL) 325 (65 FE) MG tablet Take 325 mg by mouth daily with breakfast.  . guaiFENesin (MUCINEX) 600 MG 12 hr tablet Take 600 mg by mouth 2 (two) times daily.  Marland Kitchen ipratropium-albuterol (DUONEB) 0.5-2.5 (3) MG/3ML SOLN Take 3 mLs by nebulization every 6 (six) hours as needed.  Marland Kitchen levothyroxine (SYNTHROID, LEVOTHROID) 175 MCG tablet Take 175 mcg by mouth daily before breakfast.  . losartan (COZAAR) 50 MG tablet Take 50 mg by mouth daily.  . Multiple Vitamins-Minerals (MULTIVITAMIN WITH MINERALS) tablet Take 1 tablet by mouth daily.  . polyethylene glycol (MIRALAX / GLYCOLAX) packet Take 17 g by mouth every evening.  . ranitidine (ZANTAC) 150 MG tablet Take 150 mg by mouth daily.  . simvastatin (ZOCOR) 10 MG tablet Take 10 mg by mouth at bedtime.  . traMADol (ULTRAM) 50  MG tablet Take 2 tablets (100 mg total) by mouth at bedtime.   No facility-administered encounter medications on file as of 05/26/2017.      Review of Systems    Limited secondary to dementia please see history of present illness      Immunization History  Administered Date(s) Administered  . Influenza,inj,Quad PF,6+ Mos 05/04/2015  . Influenza-Unspecified 05/28/2016  . Pneumococcal-Unspecified 06/10/2016       Pertinent  Health Maintenance Due  Topic Date Due  . INFLUENZA VACCINE  07/30/2017 (Originally 03/30/2017)  . PNA vac Low Risk Adult (2 of 2 - PCV13)  06/10/2017  . DEXA SCAN  Completed   Fall Risk  05/09/2017  Falls in the past year? No   Physical exam.  General this is a somewhat frail elderly female in no distress sitting comfortably in her wheelchair.  Her skin is warm and dry.  Musculoskeletal does have a contracture of her right fourth finger I do not see really any increased edema or erythema here there is some mild tenderness to palpation.  I do not really see evidence of swelling of adjoining joints.  .  Assessment and plan.  Right fourth finger contracture-will write an order to have physical therapy evaluate to see if they may have some ideas to reduce any discomfort here-she does not appear to be in any acute distress here but will have to be monitored.  [id number]

## 2017-06-08 DIAGNOSIS — F0390 Unspecified dementia without behavioral disturbance: Secondary | ICD-10-CM | POA: Diagnosis not present

## 2017-06-08 DIAGNOSIS — M25511 Pain in right shoulder: Secondary | ICD-10-CM | POA: Diagnosis not present

## 2017-06-08 DIAGNOSIS — R278 Other lack of coordination: Secondary | ICD-10-CM | POA: Diagnosis not present

## 2017-06-08 DIAGNOSIS — M79641 Pain in right hand: Secondary | ICD-10-CM | POA: Diagnosis not present

## 2017-06-10 DIAGNOSIS — R278 Other lack of coordination: Secondary | ICD-10-CM | POA: Diagnosis not present

## 2017-06-10 DIAGNOSIS — F0390 Unspecified dementia without behavioral disturbance: Secondary | ICD-10-CM | POA: Diagnosis not present

## 2017-06-10 DIAGNOSIS — M79641 Pain in right hand: Secondary | ICD-10-CM | POA: Diagnosis not present

## 2017-06-10 DIAGNOSIS — M25511 Pain in right shoulder: Secondary | ICD-10-CM | POA: Diagnosis not present

## 2017-06-13 DIAGNOSIS — M79641 Pain in right hand: Secondary | ICD-10-CM | POA: Diagnosis not present

## 2017-06-13 DIAGNOSIS — F0390 Unspecified dementia without behavioral disturbance: Secondary | ICD-10-CM | POA: Diagnosis not present

## 2017-06-13 DIAGNOSIS — R278 Other lack of coordination: Secondary | ICD-10-CM | POA: Diagnosis not present

## 2017-06-13 DIAGNOSIS — M25511 Pain in right shoulder: Secondary | ICD-10-CM | POA: Diagnosis not present

## 2017-06-15 DIAGNOSIS — M25511 Pain in right shoulder: Secondary | ICD-10-CM | POA: Diagnosis not present

## 2017-06-15 DIAGNOSIS — F0390 Unspecified dementia without behavioral disturbance: Secondary | ICD-10-CM | POA: Diagnosis not present

## 2017-06-15 DIAGNOSIS — R278 Other lack of coordination: Secondary | ICD-10-CM | POA: Diagnosis not present

## 2017-06-15 DIAGNOSIS — M79641 Pain in right hand: Secondary | ICD-10-CM | POA: Diagnosis not present

## 2017-06-16 DIAGNOSIS — F0390 Unspecified dementia without behavioral disturbance: Secondary | ICD-10-CM | POA: Diagnosis not present

## 2017-06-16 DIAGNOSIS — R278 Other lack of coordination: Secondary | ICD-10-CM | POA: Diagnosis not present

## 2017-06-16 DIAGNOSIS — M25511 Pain in right shoulder: Secondary | ICD-10-CM | POA: Diagnosis not present

## 2017-06-16 DIAGNOSIS — M79641 Pain in right hand: Secondary | ICD-10-CM | POA: Diagnosis not present

## 2017-06-20 ENCOUNTER — Other Ambulatory Visit: Payer: Self-pay

## 2017-06-20 DIAGNOSIS — F0390 Unspecified dementia without behavioral disturbance: Secondary | ICD-10-CM | POA: Diagnosis not present

## 2017-06-20 DIAGNOSIS — R278 Other lack of coordination: Secondary | ICD-10-CM | POA: Diagnosis not present

## 2017-06-20 DIAGNOSIS — M25511 Pain in right shoulder: Secondary | ICD-10-CM | POA: Diagnosis not present

## 2017-06-20 DIAGNOSIS — M79641 Pain in right hand: Secondary | ICD-10-CM | POA: Diagnosis not present

## 2017-06-20 MED ORDER — TRAMADOL HCL 50 MG PO TABS: 100.0000 mg | ORAL_TABLET | Freq: Every day | ORAL | 0 refills | Status: DC

## 2017-06-20 NOTE — Telephone Encounter (Signed)
RX Fax for [facility]@ [phone]  

## 2017-06-21 ENCOUNTER — Ambulatory Visit (HOSPITAL_COMMUNITY): Payer: Medicare Other | Attending: Internal Medicine

## 2017-06-21 DIAGNOSIS — R278 Other lack of coordination: Secondary | ICD-10-CM | POA: Diagnosis not present

## 2017-06-21 DIAGNOSIS — H40023 Open angle with borderline findings, high risk, bilateral: Secondary | ICD-10-CM | POA: Diagnosis not present

## 2017-06-21 DIAGNOSIS — F0390 Unspecified dementia without behavioral disturbance: Secondary | ICD-10-CM | POA: Diagnosis not present

## 2017-06-21 DIAGNOSIS — I517 Cardiomegaly: Secondary | ICD-10-CM | POA: Insufficient documentation

## 2017-06-21 DIAGNOSIS — H04123 Dry eye syndrome of bilateral lacrimal glands: Secondary | ICD-10-CM | POA: Diagnosis not present

## 2017-06-21 DIAGNOSIS — H2513 Age-related nuclear cataract, bilateral: Secondary | ICD-10-CM | POA: Diagnosis not present

## 2017-06-21 DIAGNOSIS — M79641 Pain in right hand: Secondary | ICD-10-CM | POA: Diagnosis not present

## 2017-06-21 DIAGNOSIS — R229 Localized swelling, mass and lump, unspecified: Secondary | ICD-10-CM | POA: Diagnosis present

## 2017-06-21 DIAGNOSIS — M25511 Pain in right shoulder: Secondary | ICD-10-CM | POA: Diagnosis not present

## 2017-06-21 DIAGNOSIS — J9811 Atelectasis: Secondary | ICD-10-CM | POA: Insufficient documentation

## 2017-06-22 DIAGNOSIS — M79641 Pain in right hand: Secondary | ICD-10-CM | POA: Diagnosis not present

## 2017-06-22 DIAGNOSIS — F0390 Unspecified dementia without behavioral disturbance: Secondary | ICD-10-CM | POA: Diagnosis not present

## 2017-06-22 DIAGNOSIS — M25511 Pain in right shoulder: Secondary | ICD-10-CM | POA: Diagnosis not present

## 2017-06-22 DIAGNOSIS — R278 Other lack of coordination: Secondary | ICD-10-CM | POA: Diagnosis not present

## 2017-06-23 ENCOUNTER — Non-Acute Institutional Stay (SKILLED_NURSING_FACILITY): Payer: Medicare Other | Admitting: Internal Medicine

## 2017-06-23 ENCOUNTER — Encounter: Payer: Self-pay | Admitting: Internal Medicine

## 2017-06-23 DIAGNOSIS — E039 Hypothyroidism, unspecified: Secondary | ICD-10-CM | POA: Diagnosis not present

## 2017-06-23 DIAGNOSIS — M810 Age-related osteoporosis without current pathological fracture: Secondary | ICD-10-CM | POA: Diagnosis not present

## 2017-06-23 DIAGNOSIS — E785 Hyperlipidemia, unspecified: Secondary | ICD-10-CM | POA: Diagnosis not present

## 2017-06-23 DIAGNOSIS — E559 Vitamin D deficiency, unspecified: Secondary | ICD-10-CM | POA: Diagnosis not present

## 2017-06-23 DIAGNOSIS — D509 Iron deficiency anemia, unspecified: Secondary | ICD-10-CM | POA: Diagnosis not present

## 2017-06-23 DIAGNOSIS — F0281 Dementia in other diseases classified elsewhere with behavioral disturbance: Secondary | ICD-10-CM | POA: Diagnosis not present

## 2017-06-23 DIAGNOSIS — G309 Alzheimer's disease, unspecified: Secondary | ICD-10-CM | POA: Diagnosis not present

## 2017-06-23 DIAGNOSIS — K219 Gastro-esophageal reflux disease without esophagitis: Secondary | ICD-10-CM

## 2017-06-23 DIAGNOSIS — M1990 Unspecified osteoarthritis, unspecified site: Secondary | ICD-10-CM | POA: Diagnosis not present

## 2017-06-23 DIAGNOSIS — I10 Essential (primary) hypertension: Secondary | ICD-10-CM | POA: Diagnosis not present

## 2017-06-23 NOTE — Progress Notes (Signed)
Location:   [place] Room Number: 157/W Place of Service:  SNF (31) Provider:  Geralyn Flash, MD  Patient Care Team: [name], MD as PCP - General (Internal Medicine)  Extended Emergency Contact Information Primary Emergency Contact: [name] Address: [address]          [place],  [place] Home Phone: [phone] Relation: None Secondary Emergency Contact: [place], [place] [address] [name] of [place] Phone: [phone] Relation: Grandson  Code Status:  DNR Goals of care: Advanced Directive information Advanced Directives 05/26/2017  Does Patient Have a Medical Advance Directive? Yes  Type of Advance Directive Out of facility DNR (pink MOST or yellow form)  Does patient want to make changes to medical advance directive? No - Patient declined  Copy of [place] in Chart? -  Would patient like information on creating a medical advance directive? -  Pre-existing out of facility DNR order (yellow form or pink MOST form) -     Chief Complaint  Patient presents with  . Medical Management of Chronic Issues    Routine Visit    HPI:  Pt is a 83 y.o. female seen today for medical management of chronic diseases.   Patient has H/O Dementia, hypothyroidism, Hypertension and anemia and Osteoporosis.  Patient is long term resident of facility. She is doing well and has gained weight . Her weight is around 137 lbs which is stable. Her appetite is good. Patient cannot give any history but per her grandson she has this Contracture of her Finger. Xrays has shown arthritis. Patient working with the therapy She did have some ? Paratracheal lesion on Xray done in Facility but repeat Xray in hospital did not show anything. She was also seen For EYE exam and Has Cataract and Mild Glaucoma no suggestions made  Past Medical History:  Diagnosis Date  . Anemia   . Arthritis   .  Bradycardia   . Cancer [facility])    had breast cancer 8 yrs ago  . 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 GERD (gastroesophageal reflux disease)   . HCAP (healthcare-associated pneumonia)   . Hypertension   . Hypokalemia   . Hypomagnesemia   . Osteoarthritis   . Pancreatitis, acute 05/03/2015  . Thyroid disease   . Urinary tract infection 02/07/16   Coag negative staph. Nitrofurantoin prescribed   . UTI (lower urinary tract infection) 04/03/2016   Proteus treated with Septra DS   Past Surgical History:  Procedure Laterality Date  . BREAST SURGERY    . ERCP N/A 01/22/2016   Procedure: ENDOSCOPIC RETROGRADE CHOLANGIOPANCREATOGRAPHY (ERCP) With biliary sphincterotomy and stone extraction ;  Surgeon: Rogene Houston, MD;  Location: [facility] ORS;  Service: Endoscopy;  Laterality: N/A;  . ESOPHAGOGASTRODUODENOSCOPY N/A 05/07/2015   Procedure: ESOPHAGOGASTRODUODENOSCOPY (EGD);  Surgeon: Rogene Houston, MD;  Location: [facility] ENDO SUITE;  Service: Endoscopy;  Laterality: N/A;    Allergies  Allergen Reactions  . Codeine Nausea And Vomiting  . Nsaids     Outpatient Encounter Prescriptions as of 06/23/2017  Medication Sig  . acetaminophen (TYLENOL) 325 MG tablet Take 650 mg by mouth every 6 (six) hours as needed.  Roseanne Kaufman Peru-Castor Oil (VENELEX) OINT Apply to sacrum and bilateral buttock every shift and as needed for prevention  . carboxymethylcellulose (REFRESH PLUS) 0.5 % SOLN Place 1 drop into both eyes 4 (  four) times daily.  . Cholecalciferol 1000 units TBDP Take 1 tablet by mouth daily  . cloNIDine (CATAPRES) 0.1 MG tablet Take 0.1 mg by mouth 2 (two) times daily.  Marland Kitchen docusate sodium (COLACE) 100 MG capsule Take 100 mg by mouth every morning.  . ferrous sulfate (FERROUSUL) 325 (65 FE) MG tablet Take 325 mg by mouth daily with breakfast.  . ipratropium-albuterol (DUONEB) 0.5-2.5 (3) MG/3ML SOLN Take 3 mLs by nebulization every 6 (six) hours as needed.  Marland Kitchen levothyroxine  (SYNTHROID, LEVOTHROID) 175 MCG tablet Take 175 mcg by mouth daily before breakfast.  . losartan (COZAAR) 50 MG tablet Take 50 mg by mouth daily.  . Multiple Vitamins-Minerals (MULTIVITAMIN WITH MINERALS) tablet Take 1 tablet by mouth daily.  . polyethylene glycol (MIRALAX / GLYCOLAX) packet Take 17 g by mouth every evening.  . ranitidine (ZANTAC) 150 MG tablet Take 150 mg by mouth daily.  . simvastatin (ZOCOR) 10 MG tablet Take 10 mg by mouth at bedtime.  . traMADol (ULTRAM) 50 MG tablet Take 2 tablets (100 mg total) by mouth at bedtime.   No facility-administered encounter medications on file as of 06/23/2017.      Review of Systems  Unable to perform ROS: Dementia    Immunization History  Administered Date(s) Administered  . Influenza,inj,Quad PF,6+ Mos 05/04/2015  . Influenza-Unspecified 05/28/2016, 06/03/2017  . Pneumococcal Conjugate-13 06/29/2016  . Pneumococcal-Unspecified 06/10/2016  . Tdap 05/12/2017   Pertinent  Health Maintenance Due  Topic Date Due  . INFLUENZA VACCINE  Completed  . DEXA SCAN  Completed  . PNA vac Low Risk Adult  Completed   Fall Risk  05/09/2017  Falls in the past year? No   Functional Status Survey:    Vitals:   06/23/17 1110  BP: (!) 117/55  Pulse: (!) 57  Resp: 20  Temp: (!) 97.3 F (36.3 C)  TempSrc: Oral  SpO2: 95%  Weight: 137 lb 4.8 oz (62.3 kg)  Height: 5\' 4"  (1.626 m)   Body mass index is 23.57 kg/m. Physical Exam  Constitutional: She appears well-developed and well-nourished.  HENT:  Head: Normocephalic.  Mouth/Throat: Oropharynx is clear and moist.  Eyes: Pupils are equal, round, and reactive to light.  Neck: Neck supple.  Cardiovascular: Normal rate.   Murmur heard. Pulmonary/Chest: Effort normal and breath sounds normal. No respiratory distress. She has no wheezes. She has no rales.  Abdominal: Soft. Bowel sounds are normal. She exhibits no distension. There is no tenderness. There is no rebound.  Musculoskeletal:   Edema Right More then Left  Has Contracture in her Right Fingers and pateint pulls her hand if try to straighten it  Neurological: She is alert.  Skin: Skin is warm and dry.  Psychiatric: She has a normal mood and affect. Her behavior is normal.    Labs reviewed:  Recent Labs  01/25/17 0715 03/02/17 0430 05/24/17 0400  NA 139 136 138  K 3.5 3.9 4.0  CL 105 101 104  CO2 26 25 26   GLUCOSE 99 95 87  BUN 36* 25* 27*  CREATININE 0.86 0.79 0.82  CALCIUM 8.8* 8.5* 8.8*    Recent Labs  09/01/16 1700 12/27/16 0900 01/25/17 0715  AST 18 20 19   ALT 12* 11* 11*  ALKPHOS 47 47 48  BILITOT 0.2* 0.2* 0.5  PROT 7.2 7.2 7.2  ALBUMIN 2.8* 3.0* 3.0*    Recent Labs  03/02/17 0430 03/09/17 0800 05/24/17 0400  WBC 4.9 5.0 5.1  NEUTROABS 1.9 2.5 1.8  HGB  10.1* 10.0* 9.9*  HCT 31.0* 30.9* 31.1*  MCV 87.6 88.3 90.1  PLT 268 307 224   Lab Results  Component Value Date   TSH 1.780 05/24/2017   No results found for: HGBA1C Lab Results  Component Value Date   CHOL 118 12/27/2016   HDL 47 12/27/2016   LDLCALC 53 12/27/2016   TRIG 92 12/27/2016   CHOLHDL 2.5 12/27/2016    Significant Diagnostic Results in last 30 days:  Dg Chest 2 View  Result Date: 06/21/2017 CLINICAL DATA:  History given questions paratracheal mass EXAM: CHEST  2 VIEW COMPARISON:  CT chest 05/17/2014 and portable chest x-ray of the same day FINDINGS: The lungs are not well aerated. No pneumonia or effusion is seen although there is bibasilar atelectasis present. Moderate cardiomegaly is stable with ectasia of the descending thoracic aorta. No peritracheal mass is seen by chest x-ray for on recent CT of the chest. The bones are osteopenic. IMPRESSION: Stable cardiomegaly. Mild bibasilar atelectasis. No definite active process or paratracheal mass is seen. Electronically Signed   By: Ivar Drape M.D.   On: 06/21/2017 15:39    Assessment/Plan Essential hypertension BP Controlled on Clonidine and  Losartan.  GERD On Zantac and stable  Age-related osteoporosis without current pathological fracture Will go ahead and start her on Fosamax TSH and Vit D level was normal She is on Vit D supplement  Hypothyroidism TSH Normal in 09/18  Dementia Stable Continue supportive care  Arthritis On Tramadol for pain  Iron deficiency anemia Hgb Stable on iron Supplement Guaiac negative in the past No More work Up due to her Agea,   Hyperlipidemia,with CVA history LDL 53 in 04/18 On Statin   Vitamin D deficiency On Supplement     Family/ staff Communication:   Labs/tests ordered:    Total time spent in this patient care encounter was 25_ minutes; greater than 50% of the visit spent counseling patient, reviewing records , Labs and coordinating care for problems addressed at this encounter.

## 2017-06-27 DIAGNOSIS — M25511 Pain in right shoulder: Secondary | ICD-10-CM | POA: Diagnosis not present

## 2017-06-27 DIAGNOSIS — R278 Other lack of coordination: Secondary | ICD-10-CM | POA: Diagnosis not present

## 2017-06-27 DIAGNOSIS — F0390 Unspecified dementia without behavioral disturbance: Secondary | ICD-10-CM | POA: Diagnosis not present

## 2017-06-27 DIAGNOSIS — M79641 Pain in right hand: Secondary | ICD-10-CM | POA: Diagnosis not present

## 2017-06-28 DIAGNOSIS — M25511 Pain in right shoulder: Secondary | ICD-10-CM | POA: Diagnosis not present

## 2017-06-28 DIAGNOSIS — M79641 Pain in right hand: Secondary | ICD-10-CM | POA: Diagnosis not present

## 2017-06-28 DIAGNOSIS — R278 Other lack of coordination: Secondary | ICD-10-CM | POA: Diagnosis not present

## 2017-06-28 DIAGNOSIS — F0390 Unspecified dementia without behavioral disturbance: Secondary | ICD-10-CM | POA: Diagnosis not present

## 2017-06-29 DIAGNOSIS — M25511 Pain in right shoulder: Secondary | ICD-10-CM | POA: Diagnosis not present

## 2017-06-29 DIAGNOSIS — F0390 Unspecified dementia without behavioral disturbance: Secondary | ICD-10-CM | POA: Diagnosis not present

## 2017-06-29 DIAGNOSIS — R278 Other lack of coordination: Secondary | ICD-10-CM | POA: Diagnosis not present

## 2017-06-29 DIAGNOSIS — M79641 Pain in right hand: Secondary | ICD-10-CM | POA: Diagnosis not present

## 2017-07-04 DIAGNOSIS — R278 Other lack of coordination: Secondary | ICD-10-CM | POA: Diagnosis not present

## 2017-07-04 DIAGNOSIS — F0390 Unspecified dementia without behavioral disturbance: Secondary | ICD-10-CM | POA: Diagnosis not present

## 2017-07-04 DIAGNOSIS — M79641 Pain in right hand: Secondary | ICD-10-CM | POA: Diagnosis not present

## 2017-07-04 DIAGNOSIS — M25511 Pain in right shoulder: Secondary | ICD-10-CM | POA: Diagnosis not present

## 2017-07-06 DIAGNOSIS — R278 Other lack of coordination: Secondary | ICD-10-CM | POA: Diagnosis not present

## 2017-07-06 DIAGNOSIS — M25511 Pain in right shoulder: Secondary | ICD-10-CM | POA: Diagnosis not present

## 2017-07-06 DIAGNOSIS — M79641 Pain in right hand: Secondary | ICD-10-CM | POA: Diagnosis not present

## 2017-07-06 DIAGNOSIS — F0390 Unspecified dementia without behavioral disturbance: Secondary | ICD-10-CM | POA: Diagnosis not present

## 2017-07-09 DIAGNOSIS — F0390 Unspecified dementia without behavioral disturbance: Secondary | ICD-10-CM | POA: Diagnosis not present

## 2017-07-09 DIAGNOSIS — M79641 Pain in right hand: Secondary | ICD-10-CM | POA: Diagnosis not present

## 2017-07-09 DIAGNOSIS — R278 Other lack of coordination: Secondary | ICD-10-CM | POA: Diagnosis not present

## 2017-07-09 DIAGNOSIS — M25511 Pain in right shoulder: Secondary | ICD-10-CM | POA: Diagnosis not present

## 2017-07-11 DIAGNOSIS — R278 Other lack of coordination: Secondary | ICD-10-CM | POA: Diagnosis not present

## 2017-07-11 DIAGNOSIS — M25511 Pain in right shoulder: Secondary | ICD-10-CM | POA: Diagnosis not present

## 2017-07-11 DIAGNOSIS — M79641 Pain in right hand: Secondary | ICD-10-CM | POA: Diagnosis not present

## 2017-07-11 DIAGNOSIS — F0390 Unspecified dementia without behavioral disturbance: Secondary | ICD-10-CM | POA: Diagnosis not present

## 2017-07-13 DIAGNOSIS — M25511 Pain in right shoulder: Secondary | ICD-10-CM | POA: Diagnosis not present

## 2017-07-13 DIAGNOSIS — F0390 Unspecified dementia without behavioral disturbance: Secondary | ICD-10-CM | POA: Diagnosis not present

## 2017-07-13 DIAGNOSIS — R278 Other lack of coordination: Secondary | ICD-10-CM | POA: Diagnosis not present

## 2017-07-13 DIAGNOSIS — M79641 Pain in right hand: Secondary | ICD-10-CM | POA: Diagnosis not present

## 2017-07-15 DIAGNOSIS — M25511 Pain in right shoulder: Secondary | ICD-10-CM | POA: Diagnosis not present

## 2017-07-15 DIAGNOSIS — R278 Other lack of coordination: Secondary | ICD-10-CM | POA: Diagnosis not present

## 2017-07-15 DIAGNOSIS — M79641 Pain in right hand: Secondary | ICD-10-CM | POA: Diagnosis not present

## 2017-07-15 DIAGNOSIS — F0390 Unspecified dementia without behavioral disturbance: Secondary | ICD-10-CM | POA: Diagnosis not present

## 2017-07-17 DIAGNOSIS — F0390 Unspecified dementia without behavioral disturbance: Secondary | ICD-10-CM | POA: Diagnosis not present

## 2017-07-17 DIAGNOSIS — R278 Other lack of coordination: Secondary | ICD-10-CM | POA: Diagnosis not present

## 2017-07-17 DIAGNOSIS — M25511 Pain in right shoulder: Secondary | ICD-10-CM | POA: Diagnosis not present

## 2017-07-17 DIAGNOSIS — M79641 Pain in right hand: Secondary | ICD-10-CM | POA: Diagnosis not present

## 2017-07-18 DIAGNOSIS — R278 Other lack of coordination: Secondary | ICD-10-CM | POA: Diagnosis not present

## 2017-07-18 DIAGNOSIS — M25511 Pain in right shoulder: Secondary | ICD-10-CM | POA: Diagnosis not present

## 2017-07-18 DIAGNOSIS — F0390 Unspecified dementia without behavioral disturbance: Secondary | ICD-10-CM | POA: Diagnosis not present

## 2017-07-18 DIAGNOSIS — M79641 Pain in right hand: Secondary | ICD-10-CM | POA: Diagnosis not present

## 2017-07-20 DIAGNOSIS — M25511 Pain in right shoulder: Secondary | ICD-10-CM | POA: Diagnosis not present

## 2017-07-20 DIAGNOSIS — R278 Other lack of coordination: Secondary | ICD-10-CM | POA: Diagnosis not present

## 2017-07-20 DIAGNOSIS — M79641 Pain in right hand: Secondary | ICD-10-CM | POA: Diagnosis not present

## 2017-07-20 DIAGNOSIS — F0390 Unspecified dementia without behavioral disturbance: Secondary | ICD-10-CM | POA: Diagnosis not present

## 2017-07-22 DIAGNOSIS — R278 Other lack of coordination: Secondary | ICD-10-CM | POA: Diagnosis not present

## 2017-07-22 DIAGNOSIS — M25511 Pain in right shoulder: Secondary | ICD-10-CM | POA: Diagnosis not present

## 2017-07-22 DIAGNOSIS — M79641 Pain in right hand: Secondary | ICD-10-CM | POA: Diagnosis not present

## 2017-07-22 DIAGNOSIS — F0390 Unspecified dementia without behavioral disturbance: Secondary | ICD-10-CM | POA: Diagnosis not present

## 2017-07-25 DIAGNOSIS — F0390 Unspecified dementia without behavioral disturbance: Secondary | ICD-10-CM | POA: Diagnosis not present

## 2017-07-25 DIAGNOSIS — M25511 Pain in right shoulder: Secondary | ICD-10-CM | POA: Diagnosis not present

## 2017-07-25 DIAGNOSIS — R278 Other lack of coordination: Secondary | ICD-10-CM | POA: Diagnosis not present

## 2017-07-25 DIAGNOSIS — M79641 Pain in right hand: Secondary | ICD-10-CM | POA: Diagnosis not present

## 2017-07-27 DIAGNOSIS — F0390 Unspecified dementia without behavioral disturbance: Secondary | ICD-10-CM | POA: Diagnosis not present

## 2017-07-27 DIAGNOSIS — R278 Other lack of coordination: Secondary | ICD-10-CM | POA: Diagnosis not present

## 2017-07-27 DIAGNOSIS — M25511 Pain in right shoulder: Secondary | ICD-10-CM | POA: Diagnosis not present

## 2017-07-27 DIAGNOSIS — M79641 Pain in right hand: Secondary | ICD-10-CM | POA: Diagnosis not present

## 2017-08-09 ENCOUNTER — Non-Acute Institutional Stay (SKILLED_NURSING_FACILITY): Payer: Medicare Other | Admitting: Internal Medicine

## 2017-08-09 DIAGNOSIS — F0281 Dementia in other diseases classified elsewhere with behavioral disturbance: Secondary | ICD-10-CM | POA: Diagnosis not present

## 2017-08-09 DIAGNOSIS — I10 Essential (primary) hypertension: Secondary | ICD-10-CM | POA: Diagnosis not present

## 2017-08-09 DIAGNOSIS — D509 Iron deficiency anemia, unspecified: Secondary | ICD-10-CM

## 2017-08-09 DIAGNOSIS — G309 Alzheimer's disease, unspecified: Secondary | ICD-10-CM | POA: Diagnosis not present

## 2017-08-09 DIAGNOSIS — M1990 Unspecified osteoarthritis, unspecified site: Secondary | ICD-10-CM | POA: Diagnosis not present

## 2017-08-09 DIAGNOSIS — R0989 Other specified symptoms and signs involving the circulatory and respiratory systems: Secondary | ICD-10-CM | POA: Diagnosis not present

## 2017-08-09 DIAGNOSIS — E039 Hypothyroidism, unspecified: Secondary | ICD-10-CM | POA: Diagnosis not present

## 2017-08-09 NOTE — Progress Notes (Signed)
This is a routine/acute visit.  Level of CARE skilled   Facility is [facility]  Chief complaint- medical management of chronic medical conditions including dementia-hypertension-hypothyroidism-anemia-osteoporosis- constipation- osteoarthritis-hyperlipidemia-CVA.  Acute visit secondary to runny nose.  History of present illness.  Patient is a 83 year old female who is been a long-term resident of this facility- she continues to be quite stable.  At one point there was concern about weight loss but she has actually gained weight and is eating very well with strong support from her family.  She does have a history of hypertension she is on clonidine as well as losartan-apparently this is fairly well controlled however I saw listed reading today of 158/87-I also see 140/71 took it manually got 158/88 but this will need to be monitored to see if elevations are consistent again considering her advanced age we will not be real aggressive with tight blood pressure control but would like to keep it in moderate.  She also has a history of hypothyroidism on Synthroid TSH was within normal range in September at 1.78.  She also has a history of anemia most likely chronic disease she is on iron but hemoglobin was 9.9 on most recent lab we will update this lab was done in late September.  Regards to the osteoporosis she is on Fosamax and has tolerated this well she is also on vitamin D supplement level was 40.1 back in May 2018.  Again in regards to dementia she is doing very well with supportive care and has strong family support weight continues to rise.  She also has a history of constipation family would like something additional for this and we will start Senokot.  In regards to osteoarthritis she is on tramadol as well as Tylenol she does have contracture of her right fourth finger previous x-rays have shown arthritis.  She also has a history of hyperlipidemia LDL was 53 back in April she  is on Zocor-she also does have a history of CVA in the past which is another reason to be on a statin.  At one point she had a x-ray that was suspicious for paratracheal soft tissue prominence on the right however follow-up chest x-ray in hospital did not really show anything concerning  Currently main complaint is a family has noted she has a runny nose she has developed this at times and has responded to [facility] and will restart this for short course.  Past Medical History:  Diagnosis Date  . Anemia   . Arthritis   . Bradycardia   . Cancer [facility])    had breast cancer 8 yrs ago  . 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 GERD (gastroesophageal reflux disease)   . HCAP (healthcare-associated pneumonia)   . Hypertension   . Hypokalemia   . Hypomagnesemia   . Osteoarthritis   . Pancreatitis, acute 05/03/2015  . Thyroid disease   . Urinary tract infection 02/07/16   Coag negative staph. Nitrofurantoin prescribed   . UTI (lower urinary tract infection) 04/03/2016   Proteus treated with Septra DS        Past Surgical History:  Procedure Laterality Date  . BREAST SURGERY    . ERCP N/A 01/22/2016   Procedure: ENDOSCOPIC RETROGRADE CHOLANGIOPANCREATOGRAPHY (ERCP) With biliary sphincterotomy and stone extraction ;  Surgeon: Rogene Houston, MD;  Location: [facility];  Service: Endoscopy;  Laterality: N/A;  . ESOPHAGOGASTRODUODENOSCOPY N/A 05/07/2015   Procedure: ESOPHAGOGASTRODUODENOSCOPY (EGD);  Surgeon: Rogene Houston, MD;  Location:  [facility];  Service: Endoscopy;  Laterality: N/A;        Allergies  Allergen Reactions  . Codeine Nausea And Vomiting  . Nsaids         Outpatient Encounter Prescriptions as of 06/23/2017  Medication Sig  . acetaminophen (TYLENOL) 325 MG tablet Take 650 mg by mouth every 6 (six) hours as needed.  Roseanne Kaufman [place]-Castor Oil (VENELEX) OINT Apply to sacrum and bilateral buttock every shift and as needed for  prevention  . carboxymethylcellulose (REFRESH PLUS) 0.5 % SOLN Place 1 drop into both eyes 4 (four) times daily.  . Cholecalciferol 1000 units TBDP Take 1 tablet by mouth daily  . cloNIDine (CATAPRES) 0.1 MG tablet Take 0.1 mg by mouth 2 (two) times daily.  Marland Kitchen docusate sodium (COLACE) 100 MG capsule Take 100 mg by mouth every morning.  . ferrous sulfate (FERROUSUL) 325 (65 FE) MG tablet Take 325 mg by mouth daily with breakfast.  . ipratropium-albuterol (DUONEB) 0.5-2.5 (3) MG/3ML SOLN Take 3 mLs by nebulization every 6 (six) hours as needed.  Marland Kitchen levothyroxine (SYNTHROID, LEVOTHROID) 175 MCG tablet Take 175 mcg by mouth daily before breakfast.  . losartan (COZAAR) 50 MG tablet Take 50 mg by mouth daily.  . Multiple Vitamins-Minerals (MULTIVITAMIN WITH MINERALS) tablet Take 1 tablet by mouth daily.  . polyethylene glycol (MIRALAX / GLYCOLAX) packet Take 17 g by mouth every evening.  . ranitidine (ZANTAC) 150 MG tablet Take 150 mg by mouth daily.  . simvastatin (ZOCOR) 10 MG tablet Take 10 mg by mouth at bedtime.  . traMADol (ULTRAM) 50 MG tablet Take 2 tablets (100 mg total) by mouth at bedtime.   No facility-administered encounter medications on file as of 06/23/2017.      Review of Systems  Limited secondary to dementia please see HPI--- she is not complaining of any shortness of breath family has noted a runny nose she is not really complaining of pain at this time      Immunization History  Administered Date(s) Administered  . Influenza,inj,Quad PF,6+ Mos 05/04/2015  . Influenza-Unspecified 05/28/2016, 06/03/2017  . Pneumococcal Conjugate-13 06/29/2016  . Pneumococcal-Unspecified 06/10/2016  . Tdap 05/12/2017       Pertinent  Health Maintenance Due  Topic Date Due  . INFLUENZA VACCINE  Completed  . DEXA SCAN  Completed  . PNA vac Low Risk Adult  Completed   Fall Risk  05/09/2017  Falls in the past year? No   Functional Status Survey:  Physical exam.  Temperature  is 98.4 pulse 61 respirations 20 blood pressure taken manually 158/88-O2 saturation is 95% on room air weight is 142.6 pounds this is a gain of about 3 pounds over the past.  In general this is a somewhat frail elderly female she is bright and alert eating dinner.  Her skin is warm and dry.  Eyes pupils appear reactive to light sclera and conjunctive are clear visual acuity appears grossly intact.  Oropharynx appears to be clear sh  Nose appears to have a small amount of clear drainage  Chest she has shallow air entry but there is no labored breathing or overt congestion.  Heart is regular rate and rhythm without murmur gallop or rub she has baseline lower extremity edema and a bit more on the right versus the left and this is not new   Abdomen is soft nontender with positive bowel sounds.  Musculoskeletal she also has a contracture of her right fingers which is baseline and holds her arm  in a somewhat contracted position.  As general frailty but appears to be at baseline is able to move all extremities.  Neurologic she is alert she does talk some- but cannot really appreciate true lateralizing findings but she does have lower extremity weakness which is not new.  Psych she is oriented to self and is pleasant will follow simple verbal commands and does talk some.  Labs.  May 24, 2017.  TSH 1.780.  WBC 5.1 hemoglobin 9.9 platelets 224.  Sodium 138 potassium 4 BUN 27 creatinine 0.82.   March 02, 2017.  BNP was 178.  Jan 25, 2017.  Albumin was 3.0  .  Assessment and plan.  1.  Dementia she appears to be doing very well with supportive care has gained a small amount of weight I suspect this is appetite related she has very strong support from her family at this point continue supportive care.  2.  Hypertension she appears to have some variable systolics here she is on clonidine 0.1 mg twice daily as well as losartan 50 mg a day will check blood pressures daily with  a log for provider review-again with her advanced age would not want to be overly aggressive leading to hypotension.  3.  History of osteoarthritic pain this appears stabilized she does have Tylenol as needed as well as tramadol routinely at night.  4.  History of osteoporosis she is on Fosamax appears to be tolerating as well also on vitamin D level was therapeutic back in May will update this  #5 constipation will add Senokot-S at night and monitor she is on MiraLAX-as well as Colace suspect we can cut back on the Colace.  6.  Runny nose-at this point physical exam was quite benign-could not really appreciate lung congestion or increased cough-will start Flonase for 5 days and monitor   #7-hypothyroidism she is on Synthroid TSH was within normal range in late September will monitor periodically TSH was 1.78.  8.  Anemia history-she is on iron and hemoglobin was 9.9 in late September will update this--her occult blood testing has been negative in the past-secondary to advanced age not a good candidate for aggressive workup  #9 hyperlipidemia LDL was 53 on lab done back in May we will update this she is on Zocor.  10.  History of GERD she is on Zantac but this appears to be stable.  Again will check her blood pressures daily to monitor any signs of consistent elevations.  Also will check a CBC to keep an eye on her anemia as well as a vitamin D level and a fasting lipid panel. Also will check a metabolic panel.  With history of hypertension.  [id number] note greater than 40 minutes have been spent assessing patient-discussing family concerns at bedside--reviewing her chart-reviewing her labs-and coordinating and formulating the plan of care for numerous diagnoses- of note greater than 50% of time spent coordinating plan of care

## 2017-08-10 ENCOUNTER — Encounter (HOSPITAL_COMMUNITY)
Admission: RE | Admit: 2017-08-10 | Discharge: 2017-08-10 | Disposition: A | Discharge status: Home / Self Care | Payer: Medicare Other | Source: Skilled Nursing Facility | Attending: Internal Medicine | Admitting: Internal Medicine

## 2017-08-10 DIAGNOSIS — E039 Hypothyroidism, unspecified: Secondary | ICD-10-CM | POA: Insufficient documentation

## 2017-08-10 DIAGNOSIS — R17 Unspecified jaundice: Secondary | ICD-10-CM | POA: Insufficient documentation

## 2017-08-10 DIAGNOSIS — N390 Urinary tract infection, site not specified: Secondary | ICD-10-CM | POA: Diagnosis not present

## 2017-08-10 DIAGNOSIS — E785 Hyperlipidemia, unspecified: Secondary | ICD-10-CM | POA: Insufficient documentation

## 2017-08-10 DIAGNOSIS — D509 Iron deficiency anemia, unspecified: Secondary | ICD-10-CM | POA: Insufficient documentation

## 2017-08-10 DIAGNOSIS — R262 Difficulty in walking, not elsewhere classified: Secondary | ICD-10-CM | POA: Diagnosis not present

## 2017-08-10 DIAGNOSIS — I10 Essential (primary) hypertension: Secondary | ICD-10-CM | POA: Insufficient documentation

## 2017-08-10 DIAGNOSIS — Z48815 Encounter for surgical aftercare following surgery on the digestive system: Secondary | ICD-10-CM | POA: Insufficient documentation

## 2017-08-10 LAB — CBC WITH DIFFERENTIAL/PLATELET
BASOS ABS: 0 10*3/uL (ref 0.0–0.1)
Basophils Relative: 0 %
EOS PCT: 1 %
Eosinophils Absolute: 0.1 10*3/uL (ref 0.0–0.7)
HCT: 27.8 % — ABNORMAL LOW (ref 36.0–46.0)
Hemoglobin: 8.8 g/dL — ABNORMAL LOW (ref 12.0–15.0)
LYMPHS ABS: 0.9 10*3/uL (ref 0.7–4.0)
LYMPHS PCT: 12 %
MCH: 34.6 pg — AB (ref 26.0–34.0)
MCHC: 31.7 g/dL (ref 30.0–36.0)
MCV: 109.4 fL — AB (ref 78.0–100.0)
MONO ABS: 0.7 10*3/uL (ref 0.1–1.0)
MONOS PCT: 9 %
Neutro Abs: 6.3 10*3/uL (ref 1.7–7.7)
Neutrophils Relative %: 78 %
PLATELETS: 224 10*3/uL (ref 150–400)
RBC: 2.54 MIL/uL — ABNORMAL LOW (ref 3.87–5.11)
RDW: 16 % — AB (ref 11.5–15.5)
WBC: 8 10*3/uL (ref 4.0–10.5)

## 2017-08-10 LAB — LIPID PANEL
Cholesterol: 115 mg/dL (ref 0–200)
HDL: 50 mg/dL (ref >40)
LDL Cholesterol: 51 mg/dL (ref 0–99)
Total CHOL/HDL Ratio: 2.3 RATIO
Triglycerides: 70 mg/dL (ref <150)
VLDL: 14 mg/dL (ref 0–40)

## 2017-08-10 LAB — COMPREHENSIVE METABOLIC PANEL
ALBUMIN: 3.4 g/dL — AB (ref 3.5–5.0)
ALK PHOS: 53 U/L (ref 38–126)
ALT: 11 U/L — AB (ref 14–54)
AST: 19 U/L (ref 15–41)
Anion gap: 9 (ref 5–15)
BUN: 31 mg/dL — ABNORMAL HIGH (ref 6–20)
CALCIUM: 8.9 mg/dL (ref 8.9–10.3)
CO2: 23 mmol/L (ref 22–32)
CREATININE: 0.91 mg/dL (ref 0.44–1.00)
Chloride: 106 mmol/L (ref 101–111)
GFR calc Af Amer: >60 mL/min (ref >60)
GFR calc non Af Amer: 53 mL/min — ABNORMAL LOW (ref >60)
GLUCOSE: 87 mg/dL (ref 65–99)
Potassium: 4 mmol/L (ref 3.5–5.1)
SODIUM: 138 mmol/L (ref 135–145)
Total Bilirubin: 0.4 mg/dL (ref 0.3–1.2)
Total Protein: 7.6 g/dL (ref 6.5–8.1)

## 2017-08-11 LAB — VITAMIN D 25 HYDROXY (VIT D DEFICIENCY, FRACTURES): VIT D 25 HYDROXY: 47.9 ng/mL (ref 30.0–100.0)

## 2017-08-13 ENCOUNTER — Encounter: Payer: Self-pay | Admitting: Internal Medicine

## 2017-08-24 DIAGNOSIS — D509 Iron deficiency anemia, unspecified: Secondary | ICD-10-CM | POA: Diagnosis not present

## 2017-08-24 DIAGNOSIS — E785 Hyperlipidemia, unspecified: Secondary | ICD-10-CM | POA: Diagnosis not present

## 2017-08-24 DIAGNOSIS — Z48815 Encounter for surgical aftercare following surgery on the digestive system: Secondary | ICD-10-CM | POA: Diagnosis not present

## 2017-08-24 DIAGNOSIS — I10 Essential (primary) hypertension: Secondary | ICD-10-CM | POA: Diagnosis not present

## 2017-08-24 DIAGNOSIS — E039 Hypothyroidism, unspecified: Secondary | ICD-10-CM | POA: Diagnosis not present

## 2017-08-24 DIAGNOSIS — N390 Urinary tract infection, site not specified: Secondary | ICD-10-CM | POA: Diagnosis not present

## 2017-08-24 LAB — CBC WITH DIFFERENTIAL/PLATELET
Basophils Absolute: 0.1 10*3/uL (ref 0.0–0.1)
Basophils Relative: 1 %
EOS PCT: 3 %
Eosinophils Absolute: 0.2 10*3/uL (ref 0.0–0.7)
HCT: 37.8 % (ref 36.0–46.0)
Hemoglobin: 11.7 g/dL — ABNORMAL LOW (ref 12.0–15.0)
LYMPHS ABS: 2.5 10*3/uL (ref 0.7–4.0)
LYMPHS PCT: 44 %
MCH: 28.1 pg (ref 26.0–34.0)
MCHC: 31 g/dL (ref 30.0–36.0)
MCV: 90.9 fL (ref 78.0–100.0)
MONO ABS: 0.7 10*3/uL (ref 0.1–1.0)
Monocytes Relative: 12 %
Neutro Abs: 2.3 10*3/uL (ref 1.7–7.7)
Neutrophils Relative %: 40 %
PLATELETS: 268 10*3/uL (ref 150–400)
RBC: 4.16 MIL/uL (ref 3.87–5.11)
RDW: 14.2 % (ref 11.5–15.5)
WBC: 5.7 10*3/uL (ref 4.0–10.5)

## 2017-09-01 DIAGNOSIS — L603 Nail dystrophy: Secondary | ICD-10-CM | POA: Diagnosis not present

## 2017-09-01 DIAGNOSIS — I739 Peripheral vascular disease, unspecified: Secondary | ICD-10-CM | POA: Diagnosis not present

## 2017-09-01 DIAGNOSIS — R262 Difficulty in walking, not elsewhere classified: Secondary | ICD-10-CM | POA: Diagnosis not present

## 2017-09-02 ENCOUNTER — Non-Acute Institutional Stay (SKILLED_NURSING_FACILITY): Payer: Medicare Other | Admitting: Internal Medicine

## 2017-09-02 ENCOUNTER — Encounter: Payer: Self-pay | Admitting: Internal Medicine

## 2017-09-02 DIAGNOSIS — R0989 Other specified symptoms and signs involving the circulatory and respiratory systems: Secondary | ICD-10-CM

## 2017-09-02 DIAGNOSIS — D509 Iron deficiency anemia, unspecified: Secondary | ICD-10-CM

## 2017-09-02 DIAGNOSIS — J3489 Other specified disorders of nose and nasal sinuses: Secondary | ICD-10-CM | POA: Diagnosis not present

## 2017-09-02 NOTE — Progress Notes (Signed)
Location:   [facility] Room Number: 157/W Place of Service:  SNF [phone]) Provider:  [name], MD  Patient Care Team: [name], MD as PCP - General (Internal Medicine)  Extended Emergency Contact Information Primary Emergency Contact: [name] Address: [address]          [place],  [place] Home Phone: [phone] Relation: None Secondary Emergency Contact: [place], [place] [address] [name] of [place] Phone: [phone] Relation: Grandson  Code Status:  DNR Goals of care: Advanced Directive information Advanced Directives 09/02/2017  Does Patient Have a Medical Advance Directive? Yes  Type of Advance Directive Out of facility DNR (pink MOST or yellow form)  Does patient want to make changes to medical advance directive? No - Patient declined  Copy of [place] in Chart? No - copy requested  Would patient like information on creating a medical advance directive? -  Pre-existing out of facility DNR order (yellow form or pink MOST form) -     Chief Complaint  Patient presents with  . Acute Visit    Patients c/o [place]    HPI:  Pt is a 84 y.o. female seen today for an acute visit for for family concerns that patient has developed some nasal congestion--  Patient is a long-term resident of facility with a history of dementia as well as hypertension hypothyroidism anemia osteoporosis patient osteoarthritis hyperlipidemia and history CVA.  She has been stable in regards to her chronic medical conditions she does have a very supportive family which helps with feeding-she appears to be doing quite well despite her dementia.  She does occasionally develop some upper respiratory issues of runny nose and usually responds well to a course of Flonase.  At times she has had suspected pneumonia and was treated more aggressively.  Currently family says they have noticed she has  a runny nose-patient is a poor historian secondary to dementia but is not really complaining of shortness of breath or increased cough.  She is afebrile.         Past Medical History:  Diagnosis Date  . Anemia   . Arthritis   . Bradycardia   . Cancer [facility])    had breast cancer 8 yrs ago  . 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 GERD (gastroesophageal reflux disease)   . HCAP (healthcare-associated pneumonia)   . Hypertension   . Hypokalemia   . Hypomagnesemia   . Osteoarthritis   . Pancreatitis, acute 05/03/2015  . Thyroid disease   . Urinary tract infection 02/07/16   Coag negative staph. Nitrofurantoin prescribed   . UTI (lower urinary tract infection) 04/03/2016   Proteus treated with Septra DS   Past Surgical History:  Procedure Laterality Date  . BREAST SURGERY    . ERCP N/A 01/22/2016   Procedure: ENDOSCOPIC RETROGRADE CHOLANGIOPANCREATOGRAPHY (ERCP) With biliary sphincterotomy and stone extraction ;  Surgeon: Rogene Houston, MD;  Location: [facility];  Service: Endoscopy;  Laterality: N/A;  . ESOPHAGOGASTRODUODENOSCOPY N/A 05/07/2015   Procedure: ESOPHAGOGASTRODUODENOSCOPY (EGD);  Surgeon: Rogene Houston, MD;  Location: [facility];  Service: Endoscopy;  Laterality: N/A;    Allergies  Allergen Reactions  . Codeine Nausea And Vomiting  . Nsaids     Outpatient Encounter Medications as of 09/02/2017  Medication Sig  . acetaminophen (TYLENOL) 325 MG tablet Take 650 mg by mouth  every 6 (six) hours as needed.  Marland Kitchen alendronate (FOSAMAX) 70 MG tablet Take 70 mg by mouth once a week. Take with a full glass of water on an empty stomach.  Roseanne Kaufman [place]-Castor Oil (VENELEX) OINT Apply to sacrum and bilateral buttock every shift and as needed for prevention  . carboxymethylcellulose (REFRESH PLUS) 0.5 % SOLN Place 1 drop into both eyes 4 (four) times daily.  . Cholecalciferol 1000 units TBDP Take 1 tablet by mouth daily  . cloNIDine (CATAPRES) 0.1 MG tablet  Take 0.1 mg by mouth 2 (two) times daily.  . ferrous sulfate (FERROUSUL) 325 (65 FE) MG tablet Take 325 mg by mouth daily with breakfast.  . ipratropium-albuterol (DUONEB) 0.5-2.5 (3) MG/3ML SOLN Take 3 mLs by nebulization every 6 (six) hours as needed.  Marland Kitchen levothyroxine (SYNTHROID, LEVOTHROID) 175 MCG tablet Take 175 mcg by mouth daily before breakfast.  . losartan (COZAAR) 50 MG tablet Take 50 mg by mouth daily.  . Multiple Vitamins-Minerals (MULTIVITAMIN WITH MINERALS) tablet Take 1 tablet by mouth daily.  . polyethylene glycol (MIRALAX / GLYCOLAX) packet Take 17 g by mouth every evening.  . ranitidine (ZANTAC) 150 MG tablet Take 150 mg by mouth daily.  . sennosides-docusate sodium (SENOKOT-S) 8.6-50 MG tablet Take 1 tablet by mouth at bedtime.  . simvastatin (ZOCOR) 10 MG tablet Take 10 mg by mouth at bedtime.  . traMADol (ULTRAM) 50 MG tablet Take 2 tablets (100 mg total) by mouth at bedtime.  . [DISCONTINUED] docusate sodium (COLACE) 100 MG capsule Take 100 mg by mouth every morning.   No facility-administered encounter medications on file as of 09/02/2017.     Review of Systems   This is limited secondary to dementia please see HPI nursing staff has not reported any issues-family has noted some runny nose she is not complaining of any fever chills or shortness of breath or increased cough  Immunization History  Administered Date(s) Administered  . Influenza,inj,Quad PF,6+ Mos 05/04/2015  . Influenza-Unspecified 05/28/2016, 06/03/2017  . Pneumococcal Conjugate-13 06/29/2016  . Pneumococcal-Unspecified 06/10/2016  . Tdap 05/12/2017   Pertinent  Health Maintenance Due  Topic Date Due  . INFLUENZA VACCINE  Completed  . DEXA SCAN  Completed  . PNA vac Low Risk Adult  Completed   Fall Risk  05/09/2017  Falls in the past year? No   Functional Status Survey:    She is afebrile pulse is 68 respirations 18 blood pressure is pending O2 saturation is in the 90s on room air  Physical  Exam  Elderly female in no distress sitting comfortably in her wheelchair she appears to be at her baseline.  Her skin is warm and dry she is not diaphoretic.  Eyes has possibly a very minimal amount of clear drainage visual acuity appears grossly intact sclera and conjunctive I do not appear to be inflamed  Nose again a small amount of clear drainage.  Oropharynx is clear mucous membranes moist.  Chest she does have some slight bronchial sounds on expiration there is no labored breathing she has baseline shallow air entry.  Heart is regular rate and rhythm with an occasional skipped beat she has baseline lower extremity edema I would say this is mild.  Her abdomen is soft nontender with positive bowel sounds.  Musculoskeletal continues with contracture of right hand fingers at baseline-general frailty but moves extremities at baseline.  Neurologic is grossly intact she is alert continues to talk some and follow simple verbal commands.  Psych as noted above  findings consistent with dementia but she is pleasant cooperative and follows simple verbal commands  Labs reviewed: Recent Labs    03/02/17 0430 05/24/17 0400 08/10/17 0800  NA 136 138 138  K 3.9 4.0 4.0  CL 101 104 106  CO2 25 26 23   GLUCOSE 95 87 87  BUN 25* 27* 31*  CREATININE 0.79 0.82 0.91  CALCIUM 8.5* 8.8* 8.9   Recent Labs    12/27/16 0900 01/25/17 0715 08/10/17 0800  AST 20 19 19   ALT 11* 11* 11*  ALKPHOS 47 48 53  BILITOT 0.2* 0.5 0.4  PROT 7.2 7.2 7.6  ALBUMIN 3.0* 3.0* 3.4*   Recent Labs    05/24/17 0400 08/10/17 0800 08/24/17 0945  WBC 5.1 8.0 5.7  NEUTROABS 1.8 6.3 2.3  HGB 9.9* 8.8* 11.7*  HCT 31.1* 27.8* 37.8  MCV 90.1 109.4* 90.9  PLT 224 224 268   Lab Results  Component Value Date   TSH 1.780 05/24/2017   No results found for: HGBA1C Lab Results  Component Value Date   CHOL 115 08/10/2017   HDL 50 08/10/2017   LDLCALC 51 08/10/2017   TRIG 70 08/10/2017   CHOLHDL 2.3  08/10/2017    Significant Diagnostic Results in last 30 days:  No results found.  Assessment/Plan  History of nasal discharge-this appears to be most likely clear drainage will treat again with Flonase 1 spray twice daily for 5 days and monitor- also secondary to questionable chest congestion will order a chest x-ray with her history of respiratory issues.  2.  Anemia recent hemoglobin was 11.7 which actually shows improvement this was done on December 26-at this point will monitor periodically I suspect there is an element of chronic disease with her advanced age she is on iron.  #  3 chest congestion as noted above will order chest x-ray to congestion is not extensive by any means but with her history will get the x-ray monitor vital signs pulse ox every shift for 72 hours she does have duo nebs as needed.  CPT- [id number] -

## 2017-09-03 ENCOUNTER — Encounter: Payer: Self-pay | Admitting: Internal Medicine

## 2017-09-05 ENCOUNTER — Non-Acute Institutional Stay (SKILLED_NURSING_FACILITY): Payer: Medicare Other | Admitting: Internal Medicine

## 2017-09-05 ENCOUNTER — Encounter: Payer: Self-pay | Admitting: Internal Medicine

## 2017-09-05 DIAGNOSIS — R05 Cough: Secondary | ICD-10-CM | POA: Diagnosis not present

## 2017-09-05 DIAGNOSIS — R059 Cough, unspecified: Secondary | ICD-10-CM

## 2017-09-05 DIAGNOSIS — R0989 Other specified symptoms and signs involving the circulatory and respiratory systems: Secondary | ICD-10-CM

## 2017-09-05 NOTE — Progress Notes (Signed)
Location:   [facility] Room Number: 157/W Place of Service:  SNF [id number]) Provider:  [name], MD  Patient Care Team: [name], MD as PCP - General (Internal Medicine)  Extended Emergency Contact Information Primary Emergency Contact: [name] Address: [address]          [place],  [place] Home Phone: [phone] Relation: None Secondary Emergency Contact: [place], [place] [address] [name] of [place] Phone: [phone] Relation: Grandson  Code Status:  DNR Goals of care: Advanced Directive information Advanced Directives 09/05/2017  Does Patient Have a Medical Advance Directive? Yes  Type of Advance Directive Out of facility DNR (pink MOST or yellow form)  Does patient want to make changes to medical advance directive? No - Patient declined  Copy of [place] in Chart? No - copy requested  Would patient like information on creating a medical advance directive? -  Pre-existing out of facility DNR order (yellow form or pink MOST form) -     Chief Complaint  Patient presents with  . Acute Visit    Patients c/o Cough and Congestion    HPI:  Pt is a 84 y.o. female seen today for an acute visit for complaints of continued nasal drainage as well as a cough today.  I did see her before the weekend secondary to complaints of a runny nose-she appeared to be stable he did have some drainage however and she was started on Flonase which she usually responds well to She also had duo nebs every 6 hours as needed .  We also ordered a chest x-ray which did not show any acute process  Apparently over the weekend patient was noted to continue to have a runny nose and also apparently a cough. I am following up on this  Currently patient is not complaining of any shortness of breath or discomfort- I do note she does have a nonproductive cough however vital signs are stable O2  saturations are in the 90s on room air   Her other medical conditions include dementia as well as hypertension hypothyroidism anemia osteoporosis osteoarthritis as well as hyperlipidemia and a history CVA      Past Medical History:  Diagnosis Date  . Anemia   . Arthritis   . Bradycardia   . Cancer [facility])    had breast cancer 8 yrs ago  . 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 GERD (gastroesophageal reflux disease)   . HCAP (healthcare-associated pneumonia)   . Hypertension   . Hypokalemia   . Hypomagnesemia   . Osteoarthritis   . Pancreatitis, acute 05/03/2015  . Thyroid disease   . Urinary tract infection 02/07/16   Coag negative staph. Nitrofurantoin prescribed   . UTI (lower urinary tract infection) 04/03/2016   Proteus treated with Septra DS   Past Surgical History:  Procedure Laterality Date  . BREAST SURGERY    . ERCP N/A 01/22/2016   Procedure: ENDOSCOPIC RETROGRADE CHOLANGIOPANCREATOGRAPHY (ERCP) With biliary sphincterotomy and stone extraction ;  Surgeon: Rogene Houston, MD;  Location: [facility] ORS;  Service: Endoscopy;  Laterality: N/A;  . ESOPHAGOGASTRODUODENOSCOPY N/A 05/07/2015   Procedure: ESOPHAGOGASTRODUODENOSCOPY (EGD);  Surgeon: Rogene Houston, MD;  Location: [facility] ENDO SUITE;  Service: Endoscopy;  Laterality: N/A;    Allergies  Allergen Reactions  . Codeine Nausea And Vomiting  . Nsaids  Outpatient Encounter Medications as of 09/05/2017  Medication Sig  . acetaminophen (TYLENOL) 325 MG tablet Take 650 mg by mouth every 6 (six) hours as needed.  Marland Kitchen alendronate (FOSAMAX) 70 MG tablet Take 70 mg by mouth once a week. Take with a full glass of water on an empty stomach.  Roseanne Kaufman [place]-Castor Oil (VENELEX) OINT Apply to sacrum and bilateral buttock every shift and as needed for prevention  . carboxymethylcellulose (REFRESH PLUS) 0.5 % SOLN Place 1 drop into both eyes 4 (four) times daily.  . Cholecalciferol 1000 units TBDP Take 1 tablet by mouth  daily  . cloNIDine (CATAPRES) 0.1 MG tablet Take 0.1 mg by mouth 2 (two) times daily.  . ferrous sulfate (FERROUSUL) 325 (65 FE) MG tablet Take 325 mg by mouth daily with breakfast.  . fluticasone (FLONASE) 50 MCG/ACT nasal spray Place 1 spray into both nostrils 2 (two) times daily.  Marland Kitchen ipratropium-albuterol (DUONEB) 0.5-2.5 (3) MG/3ML SOLN Take 3 mLs by nebulization every 6 (six) hours as needed.  Marland Kitchen levothyroxine (SYNTHROID, LEVOTHROID) 175 MCG tablet Take 175 mcg by mouth daily before breakfast.  . losartan (COZAAR) 50 MG tablet Take 50 mg by mouth daily.  . Multiple Vitamins-Minerals (MULTIVITAMIN WITH MINERALS) tablet Take 1 tablet by mouth daily.  . polyethylene glycol (MIRALAX / GLYCOLAX) packet Take 17 g by mouth every evening.  . ranitidine (ZANTAC) 150 MG tablet Take 150 mg by mouth daily.  . sennosides-docusate sodium (SENOKOT-S) 8.6-50 MG tablet Take 1 tablet by mouth at bedtime.  . simvastatin (ZOCOR) 10 MG tablet Take 10 mg by mouth at bedtime.  . traMADol (ULTRAM) 50 MG tablet Take 2 tablets (100 mg total) by mouth at bedtime.   No facility-administered encounter medications on file as of 09/05/2017.     Review of Systems   This is limited secondary to dementia please see HPI she is not complaining of any shortness of breath I do note some continued nasal and eye drainage however as well as a nonproductive cough.  Apparently her appetite is still good she does not really have complaints today in regards to her breathing Immunization History  Administered Date(s) Administered  . Influenza,inj,Quad PF,6+ Mos 05/04/2015  . Influenza-Unspecified 05/28/2016, 06/03/2017  . Pneumococcal Conjugate-13 06/29/2016  . Pneumococcal-Unspecified 06/10/2016  . Tdap 05/12/2017   Pertinent  Health Maintenance Due  Topic Date Due  . INFLUENZA VACCINE  Completed  . DEXA SCAN  Completed  . PNA vac Low Risk Adult  Completed   Fall Risk  05/09/2017  Falls in the past year? No   Functional  Status Survey:    Vitals:   09/05/17 1253  BP: 118/74  Pulse: 71  Resp: 18  Temp: (!) 97.4 F (36.3 C)  TempSrc: Oral  SpO2: 98%    Physical Exam in general this is a pleasant elderly female in no distress sitting comfortably in a wheelchair.  Her skin is warm and dry  .  Eyes continues to have some watery drainage from her eyes as well as nose.  Oropharynx is clear mucous membranes moist.  Chest is baseline with previous exams some slight bronchial sounds on expiration at the bases but I  no labored breathing could not really appreciating changes from last Friday when I examined her.  Heart is regular rate and rhythm without murmur gallop or rub she has baseline lower extremity edema.  Her abdomen soft nontender somewhat obese with positive bowel sounds.  Musculoskeletal is at baseline with contracture right hand  fingers-general frailty is able to move her extremities at baseline with baseline lower extremity weakness.  Neurologic is grossly intact her speech is clear she is alert.  Psych she is oriented to self follow simple verbal commands without difficulty and gives short 1 word responses at times to questions.    Labs reviewed: Recent Labs    03/02/17 0430 05/24/17 0400 08/10/17 0800  NA 136 138 138  K 3.9 4.0 4.0  CL 101 104 106  CO2 25 26 23   GLUCOSE 95 87 87  BUN 25* 27* 31*  CREATININE 0.79 0.82 0.91  CALCIUM 8.5* 8.8* 8.9   Recent Labs    12/27/16 0900 01/25/17 0715 08/10/17 0800  AST 20 19 19   ALT 11* 11* 11*  ALKPHOS 47 48 53  BILITOT 0.2* 0.5 0.4  PROT 7.2 7.2 7.6  ALBUMIN 3.0* 3.0* 3.4*   Recent Labs    05/24/17 0400 08/10/17 0800 08/24/17 0945  WBC 5.1 8.0 5.7  NEUTROABS 1.8 6.3 2.3  HGB 9.9* 8.8* 11.7*  HCT 31.1* 27.8* 37.8  MCV 90.1 109.4* 90.9  PLT 224 224 268   Lab Results  Component Value Date   TSH 1.780 05/24/2017   No results found for: HGBA1C Lab Results  Component Value Date   CHOL 115 08/10/2017   HDL 50  08/10/2017   LDLCALC 51 08/10/2017   TRIG 70 08/10/2017   CHOLHDL 2.3 08/10/2017    Significant Diagnostic Results in last 30 days:  No results found.  Assessment/Plan  #1 continued runny nose-with cough-we will add Mucinex 600 mg twice daily for 7 days-also will extend Flonase for a 7-day course.  Will make duo nebs routine every 6 hours for 72 hours and then back to as needed.  Monitor vital signs pulse ox 72 hours.  Again chest x-ray did not show any acute process which is reassuring but feel she would benefit from Mucinex as well as DuoNeb.. And continued monitoring  [phone]--- of note greater than 25 minutes spent assessing patient discussing patient's status with nursing staff as well as with her family-coordinating and formulating plan of care-of note greater than 50% of time spent coordinating plan of care with input as noted above

## 2017-09-07 ENCOUNTER — Other Ambulatory Visit: Payer: Self-pay

## 2017-09-07 MED ORDER — TRAMADOL HCL 50 MG PO TABS: 100.0000 mg | ORAL_TABLET | Freq: Every day | ORAL | 0 refills | Status: DC

## 2017-09-07 NOTE — Telephone Encounter (Signed)
RX Fax for [facility]@ [phone]  

## 2017-09-13 ENCOUNTER — Non-Acute Institutional Stay (SKILLED_NURSING_FACILITY): Payer: Medicare Other | Admitting: Internal Medicine

## 2017-09-13 ENCOUNTER — Encounter: Payer: Self-pay | Admitting: Internal Medicine

## 2017-09-13 DIAGNOSIS — R05 Cough: Secondary | ICD-10-CM

## 2017-09-13 DIAGNOSIS — R059 Cough, unspecified: Secondary | ICD-10-CM

## 2017-09-13 DIAGNOSIS — Z8701 Personal history of pneumonia (recurrent): Secondary | ICD-10-CM

## 2017-09-13 DIAGNOSIS — R0989 Other specified symptoms and signs involving the circulatory and respiratory systems: Secondary | ICD-10-CM

## 2017-09-13 NOTE — Progress Notes (Signed)
Location:   [facility] Room Number: 157/W Place of Service:  SNF [phone]) Provider:  [name], MD  Patient Care Team: [name], MD as PCP - General (Internal Medicine)  Extended Emergency Contact Information Primary Emergency Contact: Jo Hardin Address: [address]          [place],  [place] Home Phone: [phone] Relation: None Secondary Emergency Contact: Jo [place], [place] [address] [name] of [place] Phone: [phone] Relation: Grandson  Code Status:  DNR Goals of care: Advanced Directive information Advanced Directives 09/13/2017  Does Patient Have a Medical Advance Directive? Yes  Type of Advance Directive Out of facility DNR (pink MOST or yellow form)  Does patient want to make changes to medical advance directive? No - Patient declined  Copy of [place] in Chart? No - copy requested  Would patient like information on creating a medical advance directive? -  Pre-existing out of facility DNR order (yellow form or pink MOST form) -    Chief complaint-acute visit secondary to congestion cough   HPI:  Pt is a 84 y.o. female seen today for an acute visit for increased congestion and cough.  Patient was seen recently for runny nose and possible congestion chest x-ray did not really show any acute process she did receive a course of Mucinex which she is completing also have Flonase and duo nebs routinely and then as needed.  Per nursing she was stable   Today her daughter feels that she is having some increased congestion and cough however  - She feels possibly this may have developed the last  Few days.  Patient does have a history of pneumonia in the past and respiratory issues and has received antibiotics including Levaquin in the past and apparently tolerated this well.  The patient's other medical conditions include dementia hypertension hypothyroidism anemia  osteoporosis as well as osteoarthritis and a history CVA.     Past Medical History:  Diagnosis Date  . Anemia   . Arthritis   . Bradycardia   . Cancer [facility])    had breast cancer 8 yrs ago  . 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 GERD (gastroesophageal reflux disease)   . HCAP (healthcare-associated pneumonia)   . Hypertension   . Hypokalemia   . Hypomagnesemia   . Osteoarthritis   . Pancreatitis, acute 05/03/2015  . Thyroid disease   . Urinary tract infection 02/07/16   Coag negative staph. Nitrofurantoin prescribed   . UTI (lower urinary tract infection) 04/03/2016   Proteus treated with Septra DS   Past Surgical History:  Procedure Laterality Date  . BREAST SURGERY    . ERCP N/A 01/22/2016   Procedure: ENDOSCOPIC RETROGRADE CHOLANGIOPANCREATOGRAPHY (ERCP) With biliary sphincterotomy and stone extraction ;  Surgeon: Rogene Houston, MD;  Location: [facility] ORS;  Service: Endoscopy;  Laterality: N/A;  . ESOPHAGOGASTRODUODENOSCOPY N/A 05/07/2015   Procedure: ESOPHAGOGASTRODUODENOSCOPY (EGD);  Surgeon: Rogene Houston, MD;  Location: [facility] ENDO SUITE;  Service: Endoscopy;  Laterality: N/A;    Allergies  Allergen Reactions  . Codeine Nausea And Vomiting  . Nsaids     Outpatient Encounter Medications as of 09/13/2017  Medication Sig  . acetaminophen (TYLENOL) 325 MG tablet Take 650 mg by mouth every 6 (six) hours as needed.  Marland Kitchen alendronate (FOSAMAX) 70 MG tablet Take 70 mg by mouth once a week. Take  with a full glass of water on an empty stomach.  Roseanne Kaufman [place]-Castor Oil (VENELEX) OINT Apply to sacrum and bilateral buttock every shift and as needed for prevention  . carboxymethylcellulose (REFRESH PLUS) 0.5 % SOLN Place 1 drop into both eyes 4 (four) times daily.  . Cholecalciferol 1000 units TBDP Take 1 tablet by mouth daily  . cloNIDine (CATAPRES) 0.1 MG tablet Take 0.1 mg by mouth 2 (two) times daily.  . ferrous sulfate (FERROUSUL) 325 (65 FE) MG tablet Take 325  mg by mouth daily with breakfast.  . ipratropium-albuterol (DUONEB) 0.5-2.5 (3) MG/3ML SOLN Take 3 mLs by nebulization every 6 (six) hours as needed.  Marland Kitchen levothyroxine (SYNTHROID, LEVOTHROID) 175 MCG tablet Take 175 mcg by mouth daily before breakfast.  . losartan (COZAAR) 50 MG tablet Take 50 mg by mouth daily.  . Multiple Vitamins-Minerals (MULTIVITAMIN WITH MINERALS) tablet Take 1 tablet by mouth daily.  . polyethylene glycol (MIRALAX / GLYCOLAX) packet Take 17 g by mouth every evening.  . ranitidine (ZANTAC) 150 MG tablet Take 150 mg by mouth daily.  . sennosides-docusate sodium (SENOKOT-S) 8.6-50 MG tablet Take 1 tablet by mouth at bedtime.  . simvastatin (ZOCOR) 10 MG tablet Take 10 mg by mouth at bedtime.  . traMADol (ULTRAM) 50 MG tablet Take 2 tablets (100 mg total) by mouth at bedtime.  . [DISCONTINUED] fluticasone (FLONASE) 50 MCG/ACT nasal spray Place 1 spray into both nostrils 2 (two) times daily.   No facility-administered encounter medications on file as of 09/13/2017.     Review of Systems   This is limited secondary to dementia provided by family as well-family has noted some increased cough and congestion today- patient does not overtly complain of shortness of breath but she is not talking much.  Nursing staff feels her cough actually has improved last few days but apparently has had some increased cough and congestion noted last day or 2.    Immunization History  Administered Date(s) Administered  . Influenza,inj,Quad PF,6+ Mos 05/04/2015  . Influenza-Unspecified 05/28/2016, 06/03/2017  . Pneumococcal Conjugate-13 06/29/2016  . Pneumococcal-Unspecified 06/10/2016  . Tdap 05/12/2017   Pertinent  Health Maintenance Due  Topic Date Due  . INFLUENZA VACCINE  Completed  . DEXA SCAN  Completed  . PNA vac Low Risk Adult  Completed   Fall Risk  05/09/2017  Falls in the past year? No   Functional Status Survey:      Physical Exam   In general this is a pleasant  elderly female in no distress sitting in her wheelchair she does appear a bit tired however.  Her skin is warm and dry.  Eyes appears possibly to have a small amount of watery drainage.  Nose also continues to have somewhat of a small amount of watery drainage.  Oropharynx is clear mucous membranes moist.  Chest she does have some coarse breath sounds on expiration there is no labored breathing.  Abdomen is soft nontender with positive bowel sounds.  Musculoskeletal continues to have baseline with contracture on the right extremity and general frailty moves her extremities at baseline with significant lower extremity weakness which is not new.  Neurologic is grossly intact speech is clear but she is not speaking much today.  Psych she is oriented to self only follow simple verbal commands  Labs reviewed: Recent Labs    03/02/17 0430 05/24/17 0400 08/10/17 0800  NA 136 138 138  K 3.9 4.0 4.0  CL 101 104 106  CO2 25 26 23  GLUCOSE 95 87 87  BUN 25* 27* 31*  CREATININE 0.79 0.82 0.91  CALCIUM 8.5* 8.8* 8.9   Recent Labs    12/27/16 0900 01/25/17 0715 08/10/17 0800  AST 20 19 19   ALT 11* 11* 11*  ALKPHOS 47 48 53  BILITOT 0.2* 0.5 0.4  PROT 7.2 7.2 7.6  ALBUMIN 3.0* 3.0* 3.4*   Recent Labs    05/24/17 0400 08/10/17 0800 08/24/17 0945  WBC 5.1 8.0 5.7  NEUTROABS 1.8 6.3 2.3  HGB 9.9* 8.8* 11.7*  HCT 31.1* 27.8* 37.8  MCV 90.1 109.4* 90.9  PLT 224 224 268   Lab Results  Component Value Date   TSH 1.780 05/24/2017   No results found for: HGBA1C Lab Results  Component Value Date   CHOL 115 08/10/2017   HDL 50 08/10/2017   LDLCALC 51 08/10/2017   TRIG 70 08/10/2017   CHOLHDL 2.3 08/10/2017    Significant Diagnostic Results in last 30 days:  No results found.  Assessment/Plan #1 Cough with mild chest congestion- with her history will treat aggressively-will start her empirically on Levaquin 500 mg a day for 1 day 250 mg a day for 7 additional  days-also will add a probiotic for 10 days.  Also will continue Mucinex 600 mg twice daily for 7 days and Flonase for 5 additional days- also will do a short course of prednisone 10 mg a day for 3 days that will hopefully help with some congestion.  And scheduled her duo nebs routinely for 72 hours every 6 hours and then as needed.  Also will need to be monitored with vital signs pulse ox every shift for 72 hours.  Clinically she does not appear to be in any distress but does appear to have some increased chest congestion compared to previous exams and does appear to be a bit more fatigued-- again with her history will be aggressive here--- this was discussed with Dr. Lyndel Safe   [phone]      Oralia Manis, [place]

## 2017-09-26 ENCOUNTER — Non-Acute Institutional Stay (SKILLED_NURSING_FACILITY): Payer: Medicare Other | Admitting: Internal Medicine

## 2017-09-26 ENCOUNTER — Encounter (HOSPITAL_COMMUNITY)
Admission: RE | Admit: 2017-09-26 | Discharge: 2017-09-26 | Disposition: A | Discharge status: Home / Self Care | Payer: Medicare Other | Source: Skilled Nursing Facility | Attending: Internal Medicine | Admitting: Internal Medicine

## 2017-09-26 ENCOUNTER — Encounter: Payer: Self-pay | Admitting: Internal Medicine

## 2017-09-26 DIAGNOSIS — R05 Cough: Secondary | ICD-10-CM | POA: Diagnosis not present

## 2017-09-26 DIAGNOSIS — R0989 Other specified symptoms and signs involving the circulatory and respiratory systems: Secondary | ICD-10-CM | POA: Diagnosis not present

## 2017-09-26 DIAGNOSIS — I10 Essential (primary) hypertension: Secondary | ICD-10-CM | POA: Insufficient documentation

## 2017-09-26 DIAGNOSIS — D509 Iron deficiency anemia, unspecified: Secondary | ICD-10-CM | POA: Insufficient documentation

## 2017-09-26 DIAGNOSIS — E785 Hyperlipidemia, unspecified: Secondary | ICD-10-CM | POA: Insufficient documentation

## 2017-09-26 DIAGNOSIS — Z48815 Encounter for surgical aftercare following surgery on the digestive system: Secondary | ICD-10-CM | POA: Diagnosis not present

## 2017-09-26 DIAGNOSIS — R17 Unspecified jaundice: Secondary | ICD-10-CM | POA: Insufficient documentation

## 2017-09-26 DIAGNOSIS — R262 Difficulty in walking, not elsewhere classified: Secondary | ICD-10-CM | POA: Diagnosis not present

## 2017-09-26 DIAGNOSIS — N390 Urinary tract infection, site not specified: Secondary | ICD-10-CM | POA: Insufficient documentation

## 2017-09-26 DIAGNOSIS — E039 Hypothyroidism, unspecified: Secondary | ICD-10-CM | POA: Diagnosis not present

## 2017-09-26 DIAGNOSIS — R059 Cough, unspecified: Secondary | ICD-10-CM

## 2017-09-26 LAB — BASIC METABOLIC PANEL
Anion gap: 11 (ref 5–15)
BUN: 18 mg/dL (ref 6–20)
CO2: 24 mmol/L (ref 22–32)
Calcium: 9 mg/dL (ref 8.9–10.3)
Chloride: 103 mmol/L (ref 101–111)
Creatinine, Ser: 0.85 mg/dL (ref 0.44–1.00)
GFR calc non Af Amer: 57 mL/min — ABNORMAL LOW (ref >60)
Glucose, Bld: 125 mg/dL — ABNORMAL HIGH (ref 65–99)
POTASSIUM: 4 mmol/L (ref 3.5–5.1)
Sodium: 138 mmol/L (ref 135–145)

## 2017-09-26 LAB — CBC WITH DIFFERENTIAL/PLATELET
Basophils Absolute: 0 10*3/uL (ref 0.0–0.1)
Basophils Relative: 0 %
EOS PCT: 3 %
Eosinophils Absolute: 0.2 10*3/uL (ref 0.0–0.7)
HCT: 35.3 % — ABNORMAL LOW (ref 36.0–46.0)
Hemoglobin: 11.2 g/dL — ABNORMAL LOW (ref 12.0–15.0)
LYMPHS PCT: 23 %
Lymphs Abs: 1.8 10*3/uL (ref 0.7–4.0)
MCH: 28.6 pg (ref 26.0–34.0)
MCHC: 31.7 g/dL (ref 30.0–36.0)
MCV: 90.1 fL (ref 78.0–100.0)
Monocytes Absolute: 1.4 10*3/uL — ABNORMAL HIGH (ref 0.1–1.0)
Monocytes Relative: 18 %
NEUTROS PCT: 56 %
Neutro Abs: 4.3 10*3/uL (ref 1.7–7.7)
PLATELETS: 264 10*3/uL (ref 150–400)
RBC: 3.92 MIL/uL (ref 3.87–5.11)
RDW: 14.2 % (ref 11.5–15.5)
WBC: 7.7 10*3/uL (ref 4.0–10.5)

## 2017-09-26 NOTE — Progress Notes (Signed)
Location:   [facility] Room Number: 157/W Place of Service:  SNF [phone]) Provider:  [name], MD  Patient Care Team: [name], MD as PCP - General (Internal Medicine)  Extended Emergency Contact Information Primary Emergency Contact: [name] Address: [address]          [place],  [place] Home Phone: [phone] Relation: None Secondary Emergency Contact: [place], [place] [address] [name] of [place] Phone: [phone] Relation: Grandson  Code Status:  DNR Goals of care: Advanced Directive information Advanced Directives 09/26/2017  Does Patient Have a Medical Advance Directive? Yes  Type of Advance Directive Out of facility DNR (pink MOST or yellow form)  Does patient want to make changes to medical advance directive? No - Patient declined  Copy of [place] in Chart? No - copy requested  Would patient like information on creating a medical advance directive? -  Pre-existing out of facility DNR order (yellow form or pink MOST form) -    Chief complaint-acute visit follow-up cough family concerns   HPI:  Pt is a 84 y.o. female seen today for an acute visit for a cough that family says has gotten worse over the past couple days.  Patient has been seen recently for respiratory issues runny nose cough congestion and has been treated with Mucinex as well as a short dose of prednisone as well as Flonase at times and did complete a course of Levaquin as well. Her last chest x-ray did not show any acute process   Apparently her cough improved somewhat but over the past couple days family says she appears to be more weak and coughing more although the cough is not very productive.  Patient is a poor historian and cannot really give any review of systems.  She does have a history of dementia as well as hypertension history CVA anemia hypothyroidism osteoporosis as well as  osteoarthritis.  She does have duo nebs as needed     Past Medical History:  Diagnosis Date  . Anemia   . Arthritis   . Bradycardia   . Cancer [facility])    had breast cancer 8 yrs ago  . 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 GERD (gastroesophageal reflux disease)   . HCAP (healthcare-associated pneumonia)   . Hypertension   . Hypokalemia   . Hypomagnesemia   . Osteoarthritis   . Pancreatitis, acute 05/03/2015  . Thyroid disease   . Urinary tract infection 02/07/16   Coag negative staph. Nitrofurantoin prescribed   . UTI (lower urinary tract infection) 04/03/2016   Proteus treated with Septra DS   Past Surgical History:  Procedure Laterality Date  . BREAST SURGERY    . ERCP N/A 01/22/2016   Procedure: ENDOSCOPIC RETROGRADE CHOLANGIOPANCREATOGRAPHY (ERCP) With biliary sphincterotomy and stone extraction ;  Surgeon: Rogene Houston, MD;  Location: [facility] ORS;  Service: Endoscopy;  Laterality: N/A;  . ESOPHAGOGASTRODUODENOSCOPY N/A 05/07/2015   Procedure: ESOPHAGOGASTRODUODENOSCOPY (EGD);  Surgeon: Rogene Houston, MD;  Location: [facility] ENDO SUITE;  Service: Endoscopy;  Laterality: N/A;    Allergies  Allergen Reactions  . Codeine Nausea And Vomiting  . Nsaids     Outpatient Encounter Medications as of 09/26/2017  Medication Sig  . acetaminophen (TYLENOL) 325 MG tablet Take 650 mg by mouth every 6 (six) hours as needed.  Marland Kitchen alendronate (FOSAMAX) 70 MG tablet  Take 70 mg by mouth once a week. Take with a full glass of water on an empty stomach.  Roseanne Kaufman [place]-Castor Oil (VENELEX) OINT Apply to sacrum and bilateral buttock every shift and as needed for prevention  . carboxymethylcellulose (REFRESH PLUS) 0.5 % SOLN Place 1 drop into both eyes 4 (four) times daily.  . Cholecalciferol 1000 units TBDP Take 1 tablet by mouth daily  . cloNIDine (CATAPRES) 0.1 MG tablet Take 0.1 mg by mouth 2 (two) times daily.  . ferrous sulfate (FERROUSUL) 325 (65 FE) MG tablet Take 325 mg by  mouth daily with breakfast.  . ipratropium-albuterol (DUONEB) 0.5-2.5 (3) MG/3ML SOLN Take 3 mLs by nebulization every 6 (six) hours as needed.  Marland Kitchen levothyroxine (SYNTHROID, LEVOTHROID) 175 MCG tablet Take 175 mcg by mouth daily before breakfast.  . losartan (COZAAR) 50 MG tablet Take 50 mg by mouth daily.  . Multiple Vitamins-Minerals (MULTIVITAMIN WITH MINERALS) tablet Take 1 tablet by mouth daily.  . polyethylene glycol (MIRALAX / GLYCOLAX) packet Take 17 g by mouth every evening.  . ranitidine (ZANTAC) 150 MG tablet Take 150 mg by mouth daily.  . sennosides-docusate sodium (SENOKOT-S) 8.6-50 MG tablet Take 1 tablet by mouth at bedtime.  . simvastatin (ZOCOR) 10 MG tablet Take 10 mg by mouth at bedtime.  . traMADol (ULTRAM) 50 MG tablet Take 2 tablets (100 mg total) by mouth at bedtime.   No facility-administered encounter medications on file as of 09/26/2017.     Review of Systems   This continues to be limited because of dementia please see HPI per family they think she has had some increased weakness questionable decrease in appetite and nonproductive cough and they feel some increased congestion.    Immunization History  Administered Date(s) Administered  . Influenza,inj,Quad PF,6+ Mos 05/04/2015  . Influenza-Unspecified 05/28/2016, 06/03/2017  . Pneumococcal Conjugate-13 06/29/2016  . Pneumococcal-Unspecified 06/10/2016  . Tdap 05/12/2017   Pertinent  Health Maintenance Due  Topic Date Due  . INFLUENZA VACCINE  Completed  . DEXA SCAN  Completed  . PNA vac Low Risk Adult  Completed   Fall Risk  05/09/2017  Falls in the past year? No   Functional Status Survey:    Vitals:   09/26/17 1513  BP: (!) 154/70  Pulse: (!) 59  Resp: 16  Temp: 98.1 F (36.7 C)  TempSrc: Oral   Manual blood pressure was 140/90 O2 saturation is in the 90s on room air  Physical Exam   In general this is a pleasant somewhat frail-appearing elderly female in no distress sitting in her  wheelchair.  Her skin is warm and dry.  Eyes visual acuity appears to be grossly intact appears to have somewhat small amount of watery drainage this appears to be almost chronic.  Oropharynx is clear mucous membranes moist.  Nose could not really appreciate any active discharge at this time.  Chest she does have some coarse breath sounds more so her anterior lung fields there is no labored breathing.  The heart is regular rate and rhythm with occasional irregular beat he has baseline lower extremity edema she has stockings applied.  Abdomen is t soft nontender with active bowel sounds.  Musculoskeletal moves her extremities at baseline she does have contractures on the right extremity which is not new and general frailty-continues with lower extremity weakness.  Neurologic appears grossly intact he does not speak much cranial nerves appear to be intact moves her extremities at baseline with again lower extremity weakness.  Psych  she is oriented to self only does follow simple verbal commands.      Labs reviewed: Recent Labs    03/02/17 0430 05/24/17 0400 08/10/17 0800  NA 136 138 138  K 3.9 4.0 4.0  CL 101 104 106  CO2 25 26 23   GLUCOSE 95 87 87  BUN 25* 27* 31*  CREATININE 0.79 0.82 0.91  CALCIUM 8.5* 8.8* 8.9   Recent Labs    12/27/16 0900 01/25/17 0715 08/10/17 0800  AST 20 19 19   ALT 11* 11* 11*  ALKPHOS 47 48 53  BILITOT 0.2* 0.5 0.4  PROT 7.2 7.2 7.6  ALBUMIN 3.0* 3.0* 3.4*   Recent Labs    05/24/17 0400 08/10/17 0800 08/24/17 0945  WBC 5.1 8.0 5.7  NEUTROABS 1.8 6.3 2.3  HGB 9.9* 8.8* 11.7*  HCT 31.1* 27.8* 37.8  MCV 90.1 109.4* 90.9  PLT 224 224 268   Lab Results  Component Value Date   TSH 1.780 05/24/2017   No results found for: HGBA1C Lab Results  Component Value Date   CHOL 115 08/10/2017   HDL 50 08/10/2017   LDLCALC 51 08/10/2017   TRIG 70 08/10/2017   CHOLHDL 2.3 08/10/2017    Significant Diagnostic Results in last 30  days:  No results found.  Assessment/Plan  #1-with cough with some congestion- she has recently completed a course of Levaquin as well as Mucinex and Flonase -- did have a short prednisone taper.  Family feels this did help some but cough has returned-will order an updated chest x-ray and start her on routine Mucinex 600 twice daily for 7 days as well as make her duo nebs routine for 4 days and then as needed.  Also monitor vital signs and pulse ox shift for now.  Will update lab work including a CBC with differential metabolic panel and TSH as well.  2.  Hypertension-appears to have some variable blood pressures about 140/90 today I see previous readings with systolics in the 308M but also see readings in the 120s and 130s at this point will monitor she continues on clonidine 0.1 mg twice daily as well as losartan 50 mg a day.    [id number]

## 2017-10-01 IMAGING — CR DG KNEE 1-2V*L*
2 series · 2 of 2 positions shown · non-contrast
Comparison: None.

CLINICAL DATA: Left knee Pain with movement since being admitted,
pt had GB surgery this week,

EXAM:
LEFT KNEE - 1-2 VIEW

[lat (1 of 2)]
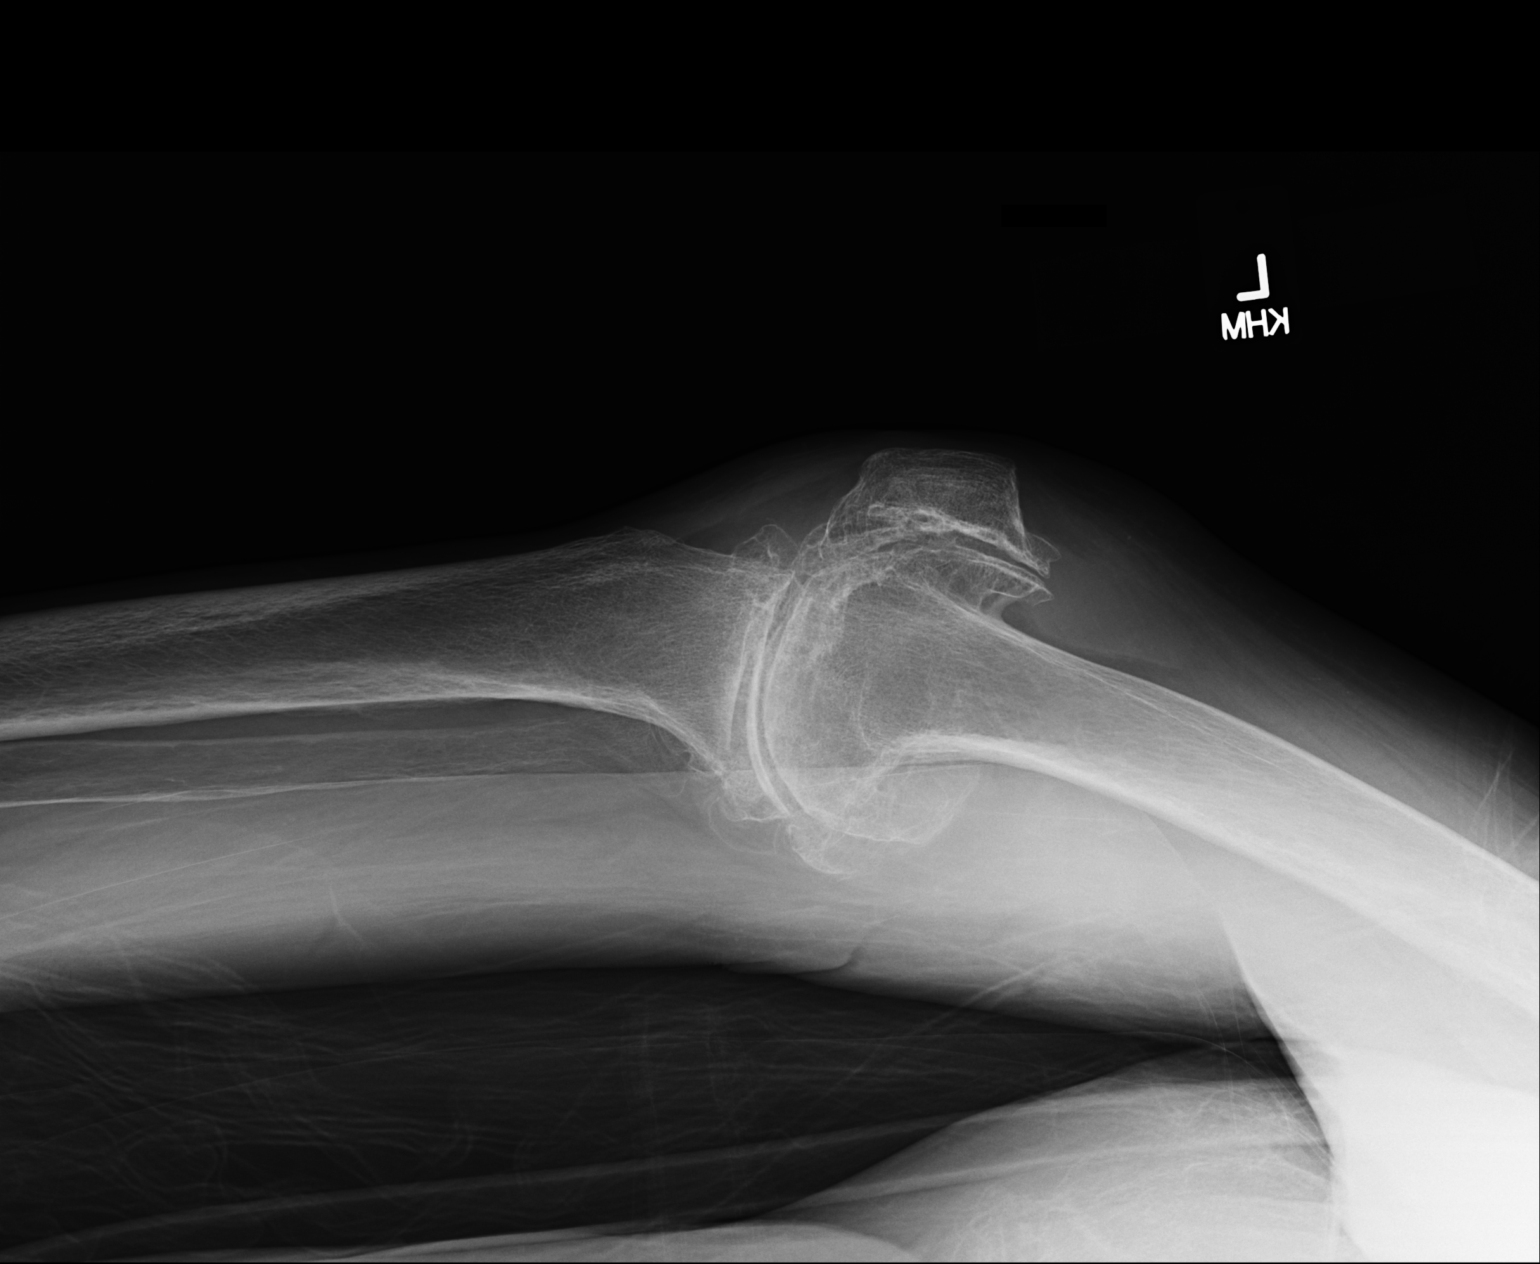

[lat (2 of 2)]
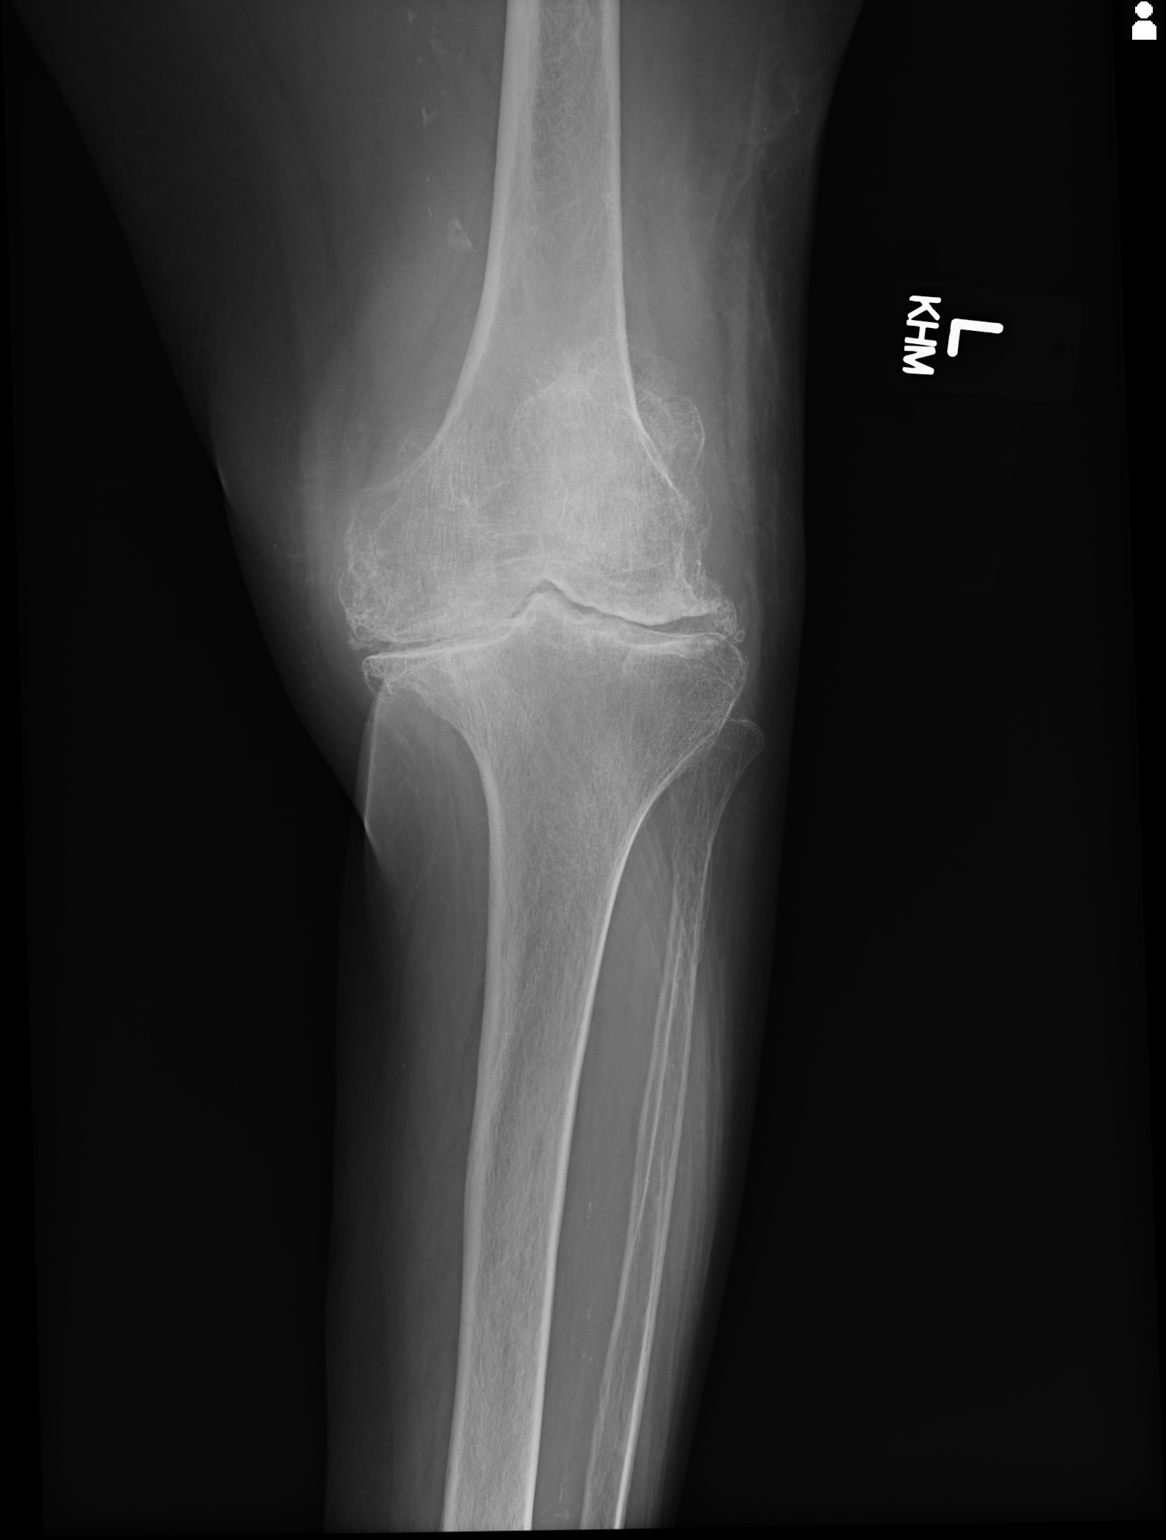

[2 of 2 positions shown; findings below may reference images not displayed]

FINDINGS: Advanced tricompartmental degenerative changes with significant loss
of articular cartilage in medial and lateral compartments, marginal
spurring about all 3 compartments, and remodeling of the femoral
condyles and medial tibial plateau. No acute fracture or
dislocation. Small effusion in the suprapatellar bursa. Patchy
popliteal arterial calcifications.
IMPRESSION: 1. Tricompartmental degenerative changes as above with small
effusion.
2. Negative for acute fracture or dislocation.

## 2017-11-01 ENCOUNTER — Non-Acute Institutional Stay (SKILLED_NURSING_FACILITY): Payer: Medicare Other | Admitting: Internal Medicine

## 2017-11-01 ENCOUNTER — Encounter: Payer: Self-pay | Admitting: Internal Medicine

## 2017-11-01 DIAGNOSIS — M810 Age-related osteoporosis without current pathological fracture: Secondary | ICD-10-CM

## 2017-11-01 DIAGNOSIS — G309 Alzheimer's disease, unspecified: Secondary | ICD-10-CM | POA: Diagnosis not present

## 2017-11-01 DIAGNOSIS — K219 Gastro-esophageal reflux disease without esophagitis: Secondary | ICD-10-CM

## 2017-11-01 DIAGNOSIS — E559 Vitamin D deficiency, unspecified: Secondary | ICD-10-CM

## 2017-11-01 DIAGNOSIS — I10 Essential (primary) hypertension: Secondary | ICD-10-CM | POA: Diagnosis not present

## 2017-11-01 DIAGNOSIS — E039 Hypothyroidism, unspecified: Secondary | ICD-10-CM

## 2017-11-01 DIAGNOSIS — E785 Hyperlipidemia, unspecified: Secondary | ICD-10-CM

## 2017-11-01 DIAGNOSIS — F0281 Dementia in other diseases classified elsewhere with behavioral disturbance: Secondary | ICD-10-CM

## 2017-11-01 DIAGNOSIS — D509 Iron deficiency anemia, unspecified: Secondary | ICD-10-CM | POA: Diagnosis not present

## 2017-11-01 NOTE — Progress Notes (Signed)
Location:   [facility] Room Number: 157/W Place of Service:  SNF (31) Provider:  Geralyn Flash, MD  Patient Care Team: [name], MD as PCP - General (Internal Medicine)  Extended Emergency Contact Information Primary Emergency Contact: [name] Address: [address]          [place],  [place] Home Phone: [phone] Relation: None Secondary Emergency Contact: [place], [place] [address] [name] of [place] Phone: [phone] Relation: Grandson  Code Status:  DNR Goals of care: Advanced Directive information Advanced Directives 11/01/2017  Does Patient Have a Medical Advance Directive? Yes  Type of Advance Directive Out of facility DNR (pink MOST or yellow form)  Does patient want to make changes to medical advance directive? No - Patient declined  Copy of [place] in Chart? No - copy requested  Would patient like information on creating a medical advance directive? -  Pre-existing out of facility DNR order (yellow form or pink MOST form) -     Chief Complaint  Patient presents with  . Medical Management of Chronic Issues    Routine Visit    HPI:  Pt is a 84 y.o. female seen today for medical management of chronic diseases.   Patient has H/O Dementia, hypothyroidism, Hypertension and anemia and Osteoporosis.. Patient is Long term resident of facility. She has been doing well. Her family comes everyday and brings food for her.  She has gained almost 5 lbs in past few months and is now 142 lbs Her appetite is good. She did have cough which finally has now resolved.  Her Yolanda Bonine was in the room. Patient was very alert and verbal today. Per Nurses she does not have any new issues.  Past Medical History:  Diagnosis Date  . Anemia   . Arthritis   . Bradycardia   . Cancer [facility])    had breast cancer 8 yrs ago  . GAVE (gastric antral vascular ectasia) 05/03/15   per  EGD  . GERD (gastroesophageal reflux disease)   . HCAP (healthcare-associated pneumonia)   . Hypertension   . Hypokalemia   . Hypomagnesemia   . Osteoarthritis   . Pancreatitis, acute 05/03/2015  . Thyroid disease   . Urinary tract infection 02/07/16   Coag negative staph. Nitrofurantoin prescribed   . UTI (lower urinary tract infection) 04/03/2016   Proteus treated with Septra DS   Past Surgical History:  Procedure Laterality Date  . BREAST SURGERY    . ERCP N/A 01/22/2016   Procedure: ENDOSCOPIC RETROGRADE CHOLANGIOPANCREATOGRAPHY (ERCP) With biliary sphincterotomy and stone extraction ;  Surgeon: Rogene Houston, MD;  Location: [facility] ORS;  Service: Endoscopy;  Laterality: N/A;  . ESOPHAGOGASTRODUODENOSCOPY N/A 05/07/2015   Procedure: ESOPHAGOGASTRODUODENOSCOPY (EGD);  Surgeon: Rogene Houston, MD;  Location: [facility] ENDO [facility];  Service: Endoscopy;  Laterality: N/A;    Allergies  Allergen Reactions  . Codeine Nausea And Vomiting  . Nsaids     Outpatient Encounter Medications as of 11/01/2017  Medication Sig  . acetaminophen (TYLENOL) 325 MG tablet Take 650 mg by mouth every 6 (six) hours as needed.  Marland Kitchen alendronate (FOSAMAX) 70 MG tablet Take 70 mg by mouth once a week. Take with a full glass of water on an empty stomach.  Roseanne Kaufman [place]-Castor Oil (VENELEX) OINT Apply to sacrum and bilateral buttock every shift and as needed for prevention  . carboxymethylcellulose (REFRESH  PLUS) 0.5 % SOLN Place 1 drop into both eyes 4 (four) times daily.  . Cholecalciferol 1000 units TBDP Take 1 tablet by mouth daily  . cloNIDine (CATAPRES) 0.1 MG tablet Take 0.1 mg by mouth 2 (two) times daily.  . ferrous sulfate (FERROUSUL) 325 (65 FE) MG tablet Take 325 mg by mouth daily with breakfast.  . ipratropium-albuterol (DUONEB) 0.5-2.5 (3) MG/3ML SOLN Take 3 mLs by nebulization every 6 (six) hours as needed.  Marland Kitchen levothyroxine (SYNTHROID, LEVOTHROID) 175 MCG tablet Take 175 mcg by mouth daily before breakfast.  .  losartan (COZAAR) 50 MG tablet Take 50 mg by mouth daily.  . Multiple Vitamins-Minerals (MULTIVITAMIN WITH MINERALS) tablet Take 1 tablet by mouth daily.  . polyethylene glycol (MIRALAX / GLYCOLAX) packet Take 17 g by mouth every evening.  . ranitidine (ZANTAC) 150 MG tablet Take 150 mg by mouth daily.  . sennosides-docusate sodium (SENOKOT-S) 8.6-50 MG tablet Take 1 tablet by mouth at bedtime.  . simvastatin (ZOCOR) 10 MG tablet Take 10 mg by mouth at bedtime.  . traMADol (ULTRAM) 50 MG tablet Take 2 tablets (100 mg total) by mouth at bedtime.   No facility-administered encounter medications on file as of 11/01/2017.      Review of Systems  Unable to perform ROS: Dementia    Immunization History  Administered Date(s) Administered  . Influenza,inj,Quad PF,6+ Mos 05/04/2015  . Influenza-Unspecified 05/28/2016, 06/03/2017  . Pneumococcal Conjugate-13 06/29/2016  . Pneumococcal-Unspecified 06/10/2016  . Tdap 05/12/2017   Pertinent  Health Maintenance Due  Topic Date Due  . INFLUENZA VACCINE  Completed  . DEXA SCAN  Completed  . PNA vac Low Risk Adult  Completed   Fall Risk  05/09/2017  Falls in the past year? No   Functional Status Survey:    Vitals:   11/01/17 0941  BP: 114/61  Pulse: 63  Resp: 20  Temp: 98.6 F (37 C)  TempSrc: Oral  SpO2: 98%  Weight: 142 lb 9.6 oz (64.7 kg)  Height: 5\' 4"  (1.626 m)   Body mass index is 24.48 kg/m. Physical Exam  Constitutional: She appears well-developed and well-nourished.  HENT:  Head: Normocephalic.  Mouth/Throat: Oropharynx is clear and moist.  Eyes: Pupils are equal, round, and reactive to light.  Neck: Neck supple.  Cardiovascular: Normal rate.  Murmur heard. Pulmonary/Chest: Effort normal and breath sounds normal. No respiratory distress. She has no wheezes. She has no rales.  Abdominal: Soft. Bowel sounds are normal. She exhibits no distension. There is no tenderness. There is no rebound.  Musculoskeletal:  Trace  edema Bilateral  Neurological: She is alert.  Has Contracture in her Right Fingers   Skin: Skin is warm and dry.  Psychiatric: She has a normal mood and affect. Her behavior is normal. Thought content normal.    Labs reviewed: Recent Labs    05/24/17 0400 08/10/17 0800 09/26/17 1551  NA 138 138 138  K 4.0 4.0 4.0  CL 104 106 103  CO2 26 23 24   GLUCOSE 87 87 125*  BUN 27* 31* 18  CREATININE 0.82 0.91 0.85  CALCIUM 8.8* 8.9 9.0   Recent Labs    12/27/16 0900 01/25/17 0715 08/10/17 0800  AST 20 19 19   ALT 11* 11* 11*  ALKPHOS 47 48 53  BILITOT 0.2* 0.5 0.4  PROT 7.2 7.2 7.6  ALBUMIN 3.0* 3.0* 3.4*   Recent Labs    08/10/17 0800 08/24/17 0945 09/26/17 1551  WBC 8.0 5.7 7.7  NEUTROABS 6.3 2.3 4.3  HGB 8.8* 11.7* 11.2*  HCT 27.8* 37.8 35.3*  MCV 109.4* 90.9 90.1  PLT 224 268 264   Lab Results  Component Value Date   TSH 1.780 05/24/2017   No results found for: HGBA1C Lab Results  Component Value Date   CHOL 115 08/10/2017   HDL 50 08/10/2017   LDLCALC 51 08/10/2017   TRIG 70 08/10/2017   CHOLHDL 2.3 08/10/2017    Significant Diagnostic Results in last 30 days:  No results found.  Assessment/Plan Essential hypertension BP Controlled on Clonidine and Losartan. Creat Stable GERD On Zantac and stable  Age-related osteoporosis without current pathological fracture She is on Fosamax now TSH and Vit D level was normal She is on Vit D supplement  Hypothyroidism TSH Normal in 09/18 Same dose of Synthroid   Dementia Stable . Doing well with support of her family  Arthritis On Tramadol for pain  Iron deficiency anemia Hgb Stable on iron Supplement Guaiac negative in the past No More work Up due to her Age,   Hyperlipidemia,with CVA history LDL 51 in 11/18 On Statin   Vitamin D deficiency On Supplement   Family/ staff Communication:   Labs/tests ordered:    Total time spent in this patient care encounter was 25_  minutes; greater than 50% of the visit spent counseling patient, reviewing records , Labs and coordinating care for problems addressed at this encounter.

## 2017-11-09 DIAGNOSIS — F0390 Unspecified dementia without behavioral disturbance: Secondary | ICD-10-CM | POA: Diagnosis not present

## 2017-11-09 DIAGNOSIS — M6281 Muscle weakness (generalized): Secondary | ICD-10-CM | POA: Diagnosis not present

## 2017-11-09 DIAGNOSIS — M1990 Unspecified osteoarthritis, unspecified site: Secondary | ICD-10-CM | POA: Diagnosis not present

## 2017-11-09 DIAGNOSIS — R279 Unspecified lack of coordination: Secondary | ICD-10-CM | POA: Diagnosis not present

## 2017-11-10 DIAGNOSIS — F0390 Unspecified dementia without behavioral disturbance: Secondary | ICD-10-CM | POA: Diagnosis not present

## 2017-11-10 DIAGNOSIS — R279 Unspecified lack of coordination: Secondary | ICD-10-CM | POA: Diagnosis not present

## 2017-11-10 DIAGNOSIS — M1990 Unspecified osteoarthritis, unspecified site: Secondary | ICD-10-CM | POA: Diagnosis not present

## 2017-11-10 DIAGNOSIS — M6281 Muscle weakness (generalized): Secondary | ICD-10-CM | POA: Diagnosis not present

## 2017-11-11 DIAGNOSIS — R279 Unspecified lack of coordination: Secondary | ICD-10-CM | POA: Diagnosis not present

## 2017-11-11 DIAGNOSIS — M6281 Muscle weakness (generalized): Secondary | ICD-10-CM | POA: Diagnosis not present

## 2017-11-11 DIAGNOSIS — M1990 Unspecified osteoarthritis, unspecified site: Secondary | ICD-10-CM | POA: Diagnosis not present

## 2017-11-11 DIAGNOSIS — F0390 Unspecified dementia without behavioral disturbance: Secondary | ICD-10-CM | POA: Diagnosis not present

## 2017-11-14 DIAGNOSIS — M6281 Muscle weakness (generalized): Secondary | ICD-10-CM | POA: Diagnosis not present

## 2017-11-14 DIAGNOSIS — M1990 Unspecified osteoarthritis, unspecified site: Secondary | ICD-10-CM | POA: Diagnosis not present

## 2017-11-14 DIAGNOSIS — R279 Unspecified lack of coordination: Secondary | ICD-10-CM | POA: Diagnosis not present

## 2017-11-14 DIAGNOSIS — F0390 Unspecified dementia without behavioral disturbance: Secondary | ICD-10-CM | POA: Diagnosis not present

## 2017-11-15 DIAGNOSIS — F0390 Unspecified dementia without behavioral disturbance: Secondary | ICD-10-CM | POA: Diagnosis not present

## 2017-11-15 DIAGNOSIS — M6281 Muscle weakness (generalized): Secondary | ICD-10-CM | POA: Diagnosis not present

## 2017-11-15 DIAGNOSIS — M1990 Unspecified osteoarthritis, unspecified site: Secondary | ICD-10-CM | POA: Diagnosis not present

## 2017-11-15 DIAGNOSIS — R279 Unspecified lack of coordination: Secondary | ICD-10-CM | POA: Diagnosis not present

## 2017-11-16 DIAGNOSIS — M6281 Muscle weakness (generalized): Secondary | ICD-10-CM | POA: Diagnosis not present

## 2017-11-16 DIAGNOSIS — F0390 Unspecified dementia without behavioral disturbance: Secondary | ICD-10-CM | POA: Diagnosis not present

## 2017-11-16 DIAGNOSIS — R279 Unspecified lack of coordination: Secondary | ICD-10-CM | POA: Diagnosis not present

## 2017-11-16 DIAGNOSIS — M1990 Unspecified osteoarthritis, unspecified site: Secondary | ICD-10-CM | POA: Diagnosis not present

## 2017-11-17 DIAGNOSIS — R279 Unspecified lack of coordination: Secondary | ICD-10-CM | POA: Diagnosis not present

## 2017-11-17 DIAGNOSIS — M6281 Muscle weakness (generalized): Secondary | ICD-10-CM | POA: Diagnosis not present

## 2017-11-17 DIAGNOSIS — M1990 Unspecified osteoarthritis, unspecified site: Secondary | ICD-10-CM | POA: Diagnosis not present

## 2017-11-17 DIAGNOSIS — F0390 Unspecified dementia without behavioral disturbance: Secondary | ICD-10-CM | POA: Diagnosis not present

## 2017-11-18 DIAGNOSIS — M1990 Unspecified osteoarthritis, unspecified site: Secondary | ICD-10-CM | POA: Diagnosis not present

## 2017-11-18 DIAGNOSIS — R279 Unspecified lack of coordination: Secondary | ICD-10-CM | POA: Diagnosis not present

## 2017-11-18 DIAGNOSIS — M6281 Muscle weakness (generalized): Secondary | ICD-10-CM | POA: Diagnosis not present

## 2017-11-18 DIAGNOSIS — F0390 Unspecified dementia without behavioral disturbance: Secondary | ICD-10-CM | POA: Diagnosis not present

## 2017-11-21 ENCOUNTER — Other Ambulatory Visit: Payer: Self-pay

## 2017-11-21 DIAGNOSIS — M6281 Muscle weakness (generalized): Secondary | ICD-10-CM | POA: Diagnosis not present

## 2017-11-21 DIAGNOSIS — R279 Unspecified lack of coordination: Secondary | ICD-10-CM | POA: Diagnosis not present

## 2017-11-21 DIAGNOSIS — F0390 Unspecified dementia without behavioral disturbance: Secondary | ICD-10-CM | POA: Diagnosis not present

## 2017-11-21 DIAGNOSIS — M1990 Unspecified osteoarthritis, unspecified site: Secondary | ICD-10-CM | POA: Diagnosis not present

## 2017-11-21 MED ORDER — TRAMADOL HCL 50 MG PO TABS: 100.0000 mg | ORAL_TABLET | Freq: Every day | ORAL | 0 refills | Status: DC

## 2017-11-21 NOTE — Telephone Encounter (Signed)
RX Fax for [facility]@ [phone]  

## 2017-11-22 DIAGNOSIS — M1990 Unspecified osteoarthritis, unspecified site: Secondary | ICD-10-CM | POA: Diagnosis not present

## 2017-11-22 DIAGNOSIS — R279 Unspecified lack of coordination: Secondary | ICD-10-CM | POA: Diagnosis not present

## 2017-11-22 DIAGNOSIS — M6281 Muscle weakness (generalized): Secondary | ICD-10-CM | POA: Diagnosis not present

## 2017-11-22 DIAGNOSIS — F0390 Unspecified dementia without behavioral disturbance: Secondary | ICD-10-CM | POA: Diagnosis not present

## 2017-11-23 DIAGNOSIS — R279 Unspecified lack of coordination: Secondary | ICD-10-CM | POA: Diagnosis not present

## 2017-11-23 DIAGNOSIS — F0390 Unspecified dementia without behavioral disturbance: Secondary | ICD-10-CM | POA: Diagnosis not present

## 2017-11-23 DIAGNOSIS — M6281 Muscle weakness (generalized): Secondary | ICD-10-CM | POA: Diagnosis not present

## 2017-11-23 DIAGNOSIS — M1990 Unspecified osteoarthritis, unspecified site: Secondary | ICD-10-CM | POA: Diagnosis not present

## 2017-11-29 DIAGNOSIS — M6281 Muscle weakness (generalized): Secondary | ICD-10-CM | POA: Diagnosis not present

## 2017-11-29 DIAGNOSIS — F0390 Unspecified dementia without behavioral disturbance: Secondary | ICD-10-CM | POA: Diagnosis not present

## 2017-11-29 DIAGNOSIS — R279 Unspecified lack of coordination: Secondary | ICD-10-CM | POA: Diagnosis not present

## 2017-11-29 DIAGNOSIS — M1990 Unspecified osteoarthritis, unspecified site: Secondary | ICD-10-CM | POA: Diagnosis not present

## 2017-12-01 DIAGNOSIS — F0390 Unspecified dementia without behavioral disturbance: Secondary | ICD-10-CM | POA: Diagnosis not present

## 2017-12-01 DIAGNOSIS — L603 Nail dystrophy: Secondary | ICD-10-CM | POA: Diagnosis not present

## 2017-12-01 DIAGNOSIS — R262 Difficulty in walking, not elsewhere classified: Secondary | ICD-10-CM | POA: Diagnosis not present

## 2017-12-01 DIAGNOSIS — M1990 Unspecified osteoarthritis, unspecified site: Secondary | ICD-10-CM | POA: Diagnosis not present

## 2017-12-01 DIAGNOSIS — R279 Unspecified lack of coordination: Secondary | ICD-10-CM | POA: Diagnosis not present

## 2017-12-01 DIAGNOSIS — I739 Peripheral vascular disease, unspecified: Secondary | ICD-10-CM | POA: Diagnosis not present

## 2017-12-01 DIAGNOSIS — M6281 Muscle weakness (generalized): Secondary | ICD-10-CM | POA: Diagnosis not present

## 2017-12-02 DIAGNOSIS — M6281 Muscle weakness (generalized): Secondary | ICD-10-CM | POA: Diagnosis not present

## 2017-12-02 DIAGNOSIS — F0390 Unspecified dementia without behavioral disturbance: Secondary | ICD-10-CM | POA: Diagnosis not present

## 2017-12-02 DIAGNOSIS — M1990 Unspecified osteoarthritis, unspecified site: Secondary | ICD-10-CM | POA: Diagnosis not present

## 2017-12-02 DIAGNOSIS — R279 Unspecified lack of coordination: Secondary | ICD-10-CM | POA: Diagnosis not present

## 2017-12-05 ENCOUNTER — Other Ambulatory Visit: Payer: Self-pay

## 2017-12-05 MED ORDER — TRAMADOL HCL 50 MG PO TABS: 100.0000 mg | ORAL_TABLET | Freq: Every day | ORAL | 2 refills | Status: DC

## 2017-12-05 NOTE — Telephone Encounter (Signed)
RX Fax for [facility]@ [phone]  

## 2017-12-06 ENCOUNTER — Non-Acute Institutional Stay (SKILLED_NURSING_FACILITY): Payer: Medicare Other | Admitting: Internal Medicine

## 2017-12-06 ENCOUNTER — Encounter: Payer: Self-pay | Admitting: Internal Medicine

## 2017-12-06 DIAGNOSIS — G309 Alzheimer's disease, unspecified: Secondary | ICD-10-CM

## 2017-12-06 DIAGNOSIS — F0281 Dementia in other diseases classified elsewhere with behavioral disturbance: Secondary | ICD-10-CM

## 2017-12-06 DIAGNOSIS — I10 Essential (primary) hypertension: Secondary | ICD-10-CM

## 2017-12-06 DIAGNOSIS — D509 Iron deficiency anemia, unspecified: Secondary | ICD-10-CM

## 2017-12-06 DIAGNOSIS — K219 Gastro-esophageal reflux disease without esophagitis: Secondary | ICD-10-CM

## 2017-12-06 DIAGNOSIS — M1990 Unspecified osteoarthritis, unspecified site: Secondary | ICD-10-CM | POA: Diagnosis not present

## 2017-12-06 DIAGNOSIS — E039 Hypothyroidism, unspecified: Secondary | ICD-10-CM

## 2017-12-06 DIAGNOSIS — M810 Age-related osteoporosis without current pathological fracture: Secondary | ICD-10-CM

## 2017-12-06 NOTE — Progress Notes (Signed)
Location:   [facility] Room Number: 157/W Place of Service:  SNF (31) Provider:  [name], MD  Patient Care Team: [name], MD as PCP - General (Internal Medicine)  Extended Emergency Contact Information Primary Emergency Contact: [name] Address: [address]          [place],  [place] Home Phone: [phone] Relation: None Secondary Emergency Contact: [place], [place] [address] [name] of [place] Phone: [phone] Relation: Grandson  Code Status:  DNR Goals of care: Advanced Directive information Advanced Directives 12/06/2017  Does Patient Have a Medical Advance Directive? Yes  Type of Advance Directive Out of facility DNR (pink MOST or yellow form)  Does patient want to make changes to medical advance directive? No - Patient declined  Copy of [place] in Chart? No - copy requested  Would patient like information on creating a medical advance directive? -  Pre-existing out of facility DNR order (yellow form or pink MOST form) -     Chief Complaint  Patient presents with  . Medical Management of Chronic Issues    Routine Visit  For medical management of chronic medical issues including dementia-hypothyroidism-hypertension-anemia-osteoporosis-GERD-osteoarthritis  HPI:  Pt is a 84 y.o. female seen today for medical management of chronic diseases. --- As noted above.  She continues to be quite stable-at times she does have respiratory issues runny nose chest congestion but usually responds to a course of Flonase as well as Mucinex and at times requires prednisone-this is been stable recently.  She has gained about 6 pounds if scales are accurate over the past month she eats quite well and her family is very supportive edema appears to be relatively at baseline.  She does have some venous stasis changes.  In regards to dementia again she has a very supportive  family and does well with supportive care and eats pretty well.  Regards to hypertension this appears stable I got a manual reading of 118/62 this evening-previous reading 130/68 this appears to be recent baseline she is on losartan as well as clonidine.  She also has a history of osteoporosis is on Fosamax as well as vitamin D with calcium vitamin D level was within normal range at 47.9 on lab done in December.  Regards her osteoarthritis he does have significant arthritic changes and weakness in all of her extremities she is on tramadol at night and gets Tylenol as needed during the day and apparently this is helping.  She also has a history of anemia with iron deficiency she is on iron hemoglobin has shown stability it was 11.2 on lab Currently she is resting in bed comfortably nursing does not report any recent issues she does not have any complaints again she is a poor historian secondary to dementia but looks to be doing well.       Past Medical History:  Diagnosis Date  . Anemia   . Arthritis   . Bradycardia   . Cancer [facility])    had breast cancer 8 yrs ago  . 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 GERD (gastroesophageal reflux disease)   . HCAP (healthcare-associated pneumonia)   . Hypertension   . Hypokalemia   . Hypomagnesemia   . Osteoarthritis   . Pancreatitis, acute 05/03/2015  . Thyroid disease   . Urinary tract infection 02/07/16   Coag negative staph. Nitrofurantoin  prescribed   . UTI (lower urinary tract infection) 04/03/2016   Proteus treated with Septra DS   Past Surgical History:  Procedure Laterality Date  . BREAST SURGERY    . ERCP N/A 01/22/2016   Procedure: ENDOSCOPIC RETROGRADE CHOLANGIOPANCREATOGRAPHY (ERCP) With biliary sphincterotomy and stone extraction ;  Surgeon: Rogene Houston, MD;  Location: [facility] ORS;  Service: Endoscopy;  Laterality: N/A;  . ESOPHAGOGASTRODUODENOSCOPY N/A 05/07/2015   Procedure: ESOPHAGOGASTRODUODENOSCOPY (EGD);   Surgeon: Rogene Houston, MD;  Location: [facility] ENDO SUITE;  Service: Endoscopy;  Laterality: N/A;    Allergies  Allergen Reactions  . Codeine Nausea And Vomiting  . Nsaids     Outpatient Encounter Medications as of 12/06/2017  Medication Sig  . acetaminophen (TYLENOL) 325 MG tablet Take 650 mg by mouth every 6 (six) hours as needed.  Marland Kitchen alendronate (FOSAMAX) 70 MG tablet Take 70 mg by mouth once a week. Take with a full glass of water on an empty stomach.  Roseanne Kaufman [place]-Castor Oil (VENELEX) OINT Apply to sacrum and bilateral buttock every shift and as needed for prevention  . carboxymethylcellulose (REFRESH PLUS) 0.5 % SOLN Place 1 drop into both eyes 4 (four) times daily.  . Cholecalciferol 1000 units TBDP Take 1 tablet by mouth daily  . cloNIDine (CATAPRES) 0.1 MG tablet Take 0.1 mg by mouth 2 (two) times daily.  . ferrous sulfate (FERROUSUL) 325 (65 FE) MG tablet Take 325 mg by mouth daily with breakfast.  . fluticasone ([employer] FLUTICASONE PROPIONATE) 50 MCG/ACT nasal spray Place 1 spray into both nostrils 2 (two) times daily.  Marland Kitchen ipratropium-albuterol (DUONEB) 0.5-2.5 (3) MG/3ML SOLN Take 3 mLs by nebulization every 6 (six) hours as needed.  Marland Kitchen levothyroxine (SYNTHROID, LEVOTHROID) 175 MCG tablet Take 175 mcg by mouth daily before breakfast.  . losartan (COZAAR) 50 MG tablet Take 50 mg by mouth daily.  . Multiple Vitamins-Minerals (MULTIVITAMIN WITH MINERALS) tablet Take 1 tablet by mouth daily.  . polyethylene glycol (MIRALAX / GLYCOLAX) packet Take 17 g by mouth every evening.  . ranitidine (ZANTAC) 150 MG tablet Take 150 mg by mouth daily.  . sennosides-docusate sodium (SENOKOT-S) 8.6-50 MG tablet Take 1 tablet by mouth at bedtime.  . simvastatin (ZOCOR) 10 MG tablet Take 10 mg by mouth at bedtime.  . traMADol (ULTRAM) 50 MG tablet Take 2 tablets (100 mg total) by mouth at bedtime.   No facility-administered encounter medications on file as of 12/06/2017.      Review of Systems This is  limited secondary to dementia please see HPI when I asked her she said she was not having pain  shortness of breath or discomfort appears to be bright and alert   Immunization History  Administered Date(s) Administered  . Influenza,inj,Quad PF,6+ Mos 05/04/2015  . Influenza-Unspecified 05/28/2016, 06/03/2017  . Pneumococcal Conjugate-13 06/29/2016  . Pneumococcal-Unspecified 06/10/2016  . Tdap 05/12/2017   Pertinent  Health Maintenance Due  Topic Date Due  . INFLUENZA VACCINE  03/30/2018  . DEXA SCAN  Completed  . PNA vac Low Risk Adult  Completed   Fall Risk  05/09/2017  Falls in the past year? No   Functional Status Survey:    Vitals:   12/06/17 1546  BP: 130/68  Pulse: 72  Resp: 18  Temp: 98 F (36.7 C)  TempSrc: Oral  SpO2: 97%  Weight: 148 lb 3.2 oz (67.2 kg)  Height: 5\' 4"  (1.626 m)  --- Of note manual blood pressure was 118/62 Body mass index is 25.44 kg/m.  Physical Exam --In general this is a pleasant elderly female resting comfortably in bed she is bright and alert.  Her skin is warm and dry.  Eyes pupils appear reactive to light sclera conjunctive are clear visual acuity appears grossly intact.  Oropharynx is clear mucous membranes moist.  Chest is clear to auscultation with somewhat shallow air entry there is no labored breathing.  Heart is regular rate and rhythm with an occasional irregular beat-she does have a slight systolic murmur.  She has a trace lower extremity edema venous stasis changes.  Abdomen is soft nontender with positive bowel sounds.  Musculoskeletal appears able to move all extremities x4 with significant lower extremity weakness which is baseline has baseline contractures of her right hand fingers---with some upper extremity weakness as well which is unchanged.  Neurologic she is bright and alert does talk some and follow simple verbal commands without difficulty.  Psych as noted above she does have dementia but continues to be  very pleasant cooperative  Labs reviewed: Recent Labs    05/24/17 0400 08/10/17 0800 09/26/17 1551  NA 138 138 138  K 4.0 4.0 4.0  CL 104 106 103  CO2 26 23 24   GLUCOSE 87 87 125*  BUN 27* 31* 18  CREATININE 0.82 0.91 0.85  CALCIUM 8.8* 8.9 9.0   Recent Labs    12/27/16 0900 01/25/17 0715 08/10/17 0800  AST 20 19 19   ALT 11* 11* 11*  ALKPHOS 47 48 53  BILITOT 0.2* 0.5 0.4  PROT 7.2 7.2 7.6  ALBUMIN 3.0* 3.0* 3.4*   Recent Labs    08/10/17 0800 08/24/17 0945 09/26/17 1551  WBC 8.0 5.7 7.7  NEUTROABS 6.3 2.3 4.3  HGB 8.8* 11.7* 11.2*  HCT 27.8* 37.8 35.3*  MCV 109.4* 90.9 90.1  PLT 224 268 264   Lab Results  Component Value Date   TSH 1.780 05/24/2017   No results found for: HGBA1C Lab Results  Component Value Date   CHOL 115 08/10/2017   HDL 50 08/10/2017   LDLCALC 51 08/10/2017   TRIG 70 08/10/2017   CHOLHDL 2.3 08/10/2017    Significant Diagnostic Results in last 30 days:  No results found.  Assessment/Plan   #1-history of dementia she appears to be doing well with supportive care she does have a very supportive family she has gained weight I suspect this is more appetite related but will order a reweight to assess accuracy.  2.-History of hypertension this appears stable as noted above on the clonidine and losartan.  3.  History of osteoarthritic pain she is on tramadol at night and Tylenol as needed during the day at this point appears to be stable.  4.  History of osteoporosis continues on Fosamax as well as vitamin D with calcium again vitamin D level was satisfactory at 47.9 on lab done in December.  5.  History of hypothyroidism TSH was within normal limits back in September will update this she is on Synthroid.  6.  History of anemia with an element of iron deficiency she is on iron hemoglobin has shown improvement to stability at 11.2 on lab done in January will update this as well.  7.  History of GERD this appears stable on  Zantac.  8.  History of hyperlipidemia she is on a statin LDL was 51 on January lab.  9.  History of respiratory issues this appears stable she does have duo nebs as needed as well as Fluticasone spray routinely which appears to help  with her allergy and nasal drainage  #10 constipation she is on numerous agents and apparently this has largely stabilized she is on MiraLAX in the evening as well as senna at night as well.    Again will update a CBC with a history of anemia also update a BMP to keep an eye on her renal function and electrolytes with history of hypertension-we will also obtain an updated TSH with history of hypothyroidism and will have a reweight for accuracy  [phone]

## 2017-12-07 ENCOUNTER — Other Ambulatory Visit (HOSPITAL_COMMUNITY)
Admission: RE | Admit: 2017-12-07 | Discharge: 2017-12-07 | Disposition: A | Discharge status: Home / Self Care | Payer: Medicare Other | Source: Skilled Nursing Facility | Attending: Internal Medicine | Admitting: Internal Medicine

## 2017-12-07 DIAGNOSIS — E039 Hypothyroidism, unspecified: Secondary | ICD-10-CM | POA: Diagnosis not present

## 2017-12-07 DIAGNOSIS — D509 Iron deficiency anemia, unspecified: Secondary | ICD-10-CM | POA: Insufficient documentation

## 2017-12-07 LAB — CBC WITH DIFFERENTIAL/PLATELET
BASOS ABS: 0 10*3/uL (ref 0.0–0.1)
Basophils Relative: 1 %
EOS PCT: 6 %
Eosinophils Absolute: 0.2 10*3/uL (ref 0.0–0.7)
HEMATOCRIT: 34 % — AB (ref 36.0–46.0)
HEMOGLOBIN: 10.6 g/dL — AB (ref 12.0–15.0)
LYMPHS ABS: 1.7 10*3/uL (ref 0.7–4.0)
LYMPHS PCT: 44 %
MCH: 28.6 pg (ref 26.0–34.0)
MCHC: 31.2 g/dL (ref 30.0–36.0)
MCV: 91.6 fL (ref 78.0–100.0)
Monocytes Absolute: 0.4 10*3/uL (ref 0.1–1.0)
Monocytes Relative: 11 %
NEUTROS ABS: 1.5 10*3/uL — AB (ref 1.7–7.7)
Neutrophils Relative %: 38 %
PLATELETS: 227 10*3/uL (ref 150–400)
RBC: 3.71 MIL/uL — ABNORMAL LOW (ref 3.87–5.11)
RDW: 14.1 % (ref 11.5–15.5)
WBC: 3.9 10*3/uL — ABNORMAL LOW (ref 4.0–10.5)

## 2017-12-07 LAB — BASIC METABOLIC PANEL
Anion gap: 10 (ref 5–15)
BUN: 31 mg/dL — ABNORMAL HIGH (ref 6–20)
CALCIUM: 8.5 mg/dL — AB (ref 8.9–10.3)
CHLORIDE: 106 mmol/L (ref 101–111)
CO2: 26 mmol/L (ref 22–32)
Creatinine, Ser: 0.81 mg/dL (ref 0.44–1.00)
GFR calc non Af Amer: >60 mL/min (ref >60)
GLUCOSE: 87 mg/dL (ref 65–99)
POTASSIUM: 3.7 mmol/L (ref 3.5–5.1)
Sodium: 142 mmol/L (ref 135–145)

## 2017-12-07 LAB — TSH: TSH: 2.239 u[IU]/mL (ref 0.350–4.500)

## 2017-12-12 ENCOUNTER — Other Ambulatory Visit: Payer: Self-pay

## 2017-12-12 MED ORDER — TRAMADOL HCL 50 MG PO TABS: 100.0000 mg | ORAL_TABLET | Freq: Every day | ORAL | 0 refills | Status: DC

## 2017-12-12 NOTE — Telephone Encounter (Signed)
RX Fax for [facility]@ [phone]  

## 2017-12-22 ENCOUNTER — Non-Acute Institutional Stay (SKILLED_NURSING_FACILITY): Payer: Medicare Other | Admitting: Internal Medicine

## 2017-12-22 ENCOUNTER — Encounter: Payer: Self-pay | Admitting: Internal Medicine

## 2017-12-22 DIAGNOSIS — H109 Unspecified conjunctivitis: Secondary | ICD-10-CM | POA: Diagnosis not present

## 2017-12-22 NOTE — Progress Notes (Signed)
Location:   [facility] Room Number: 157/W Place of Service:  SNF [id number]) Provider:  [name], MD  Patient Care Team: [name], MD as PCP - General (Internal Medicine)  Extended Emergency Contact Information Primary Emergency Contact: [name] Address: [address]          [place],  [place] Home Phone: [phone] Relation: None Secondary Emergency Contact: [place], [place] [address] [name] of [place] Phone: [phone] Relation: Grandson  Code Status:  DNR Goals of care: Advanced Directive information Advanced Directives 12/22/2017  Does Patient Have a Medical Advance Directive? Yes  Type of Advance Directive Out of facility DNR (pink MOST or yellow form)  Does patient want to make changes to medical advance directive? No - Patient declined  Copy of [place] in Chart? No - copy requested  Would patient like information on creating a medical advance directive? -  Pre-existing out of facility DNR order (yellow form or pink MOST form) -     Chief complaint-acute visit secondary to suspected conjunctivitis  HPI:  Pt is a 84 y.o. female seen today for an acute visit for family concerns the patient has conjunctivitis with drainage from eyes.  She is a long-term resident of facility with a history of dementia hypothyroidism hypertension as well as anemia and osteoporosis as well as GERD and osteoarthritis.  .  At times she will develop some allergic rhinitis symptoms and usually responds to Flonase.  She does not appear to be complaining of any eye pain today or visual changes    Past Medical History:  Diagnosis Date  . Anemia   . Arthritis   . Bradycardia   . Cancer [facility])    had breast cancer 8 yrs ago  . 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 GERD (gastroesophageal reflux disease)   . HCAP (healthcare-associated pneumonia)   .  Hypertension   . Hypokalemia   . Hypomagnesemia   . Osteoarthritis   . Pancreatitis, acute 05/03/2015  . Thyroid disease   . Urinary tract infection 02/07/16   Coag negative staph. Nitrofurantoin prescribed   . UTI (lower urinary tract infection) 04/03/2016   Proteus treated with Septra DS   Past Surgical History:  Procedure Laterality Date  . BREAST SURGERY    . ERCP N/A 01/22/2016   Procedure: ENDOSCOPIC RETROGRADE CHOLANGIOPANCREATOGRAPHY (ERCP) With biliary sphincterotomy and stone extraction ;  Surgeon: Rogene Houston, MD;  Location: [facility] ORS;  Service: Endoscopy;  Laterality: N/A;  . ESOPHAGOGASTRODUODENOSCOPY N/A 05/07/2015   Procedure: ESOPHAGOGASTRODUODENOSCOPY (EGD);  Surgeon: Rogene Houston, MD;  Location: [facility] ENDO [facility];  Service: Endoscopy;  Laterality: N/A;    Allergies  Allergen Reactions  . Codeine Nausea And Vomiting  . Nsaids     Outpatient Encounter Medications as of 12/22/2017  Medication Sig  . acetaminophen (TYLENOL) 325 MG tablet Take 650 mg by mouth every 6 (six) hours as needed.  Marland Kitchen alendronate (FOSAMAX) 70 MG tablet Take 70 mg by mouth once a week. Take with a full glass of water on an empty stomach.  Roseanne Kaufman [place]-Castor Oil (VENELEX) OINT Apply to sacrum and bilateral buttock every shift and as needed for prevention  . carboxymethylcellulose (REFRESH PLUS) 0.5 % SOLN Place 1 drop into both eyes 4 (four) times daily.  . Cholecalciferol 1000 units TBDP Take 1 tablet by mouth daily  .  cloNIDine (CATAPRES) 0.1 MG tablet Take 0.1 mg by mouth 2 (two) times daily.  . ferrous sulfate (FERROUSUL) 325 (65 FE) MG tablet Take 325 mg by mouth daily with breakfast.  . fluticasone ([employer]S FLUTICASONE PROPIONATE) 50 MCG/ACT nasal spray Place 1 spray into both nostrils 2 (two) times daily.  Marland Kitchen ipratropium-albuterol (DUONEB) 0.5-2.5 (3) MG/3ML SOLN Take 3 mLs by nebulization every 6 (six) hours as needed.  Marland Kitchen levothyroxine (SYNTHROID, LEVOTHROID) 175 MCG tablet Take 175 mcg by mouth  daily before breakfast.  . losartan (COZAAR) 50 MG tablet Take 50 mg by mouth daily.  . Multiple Vitamins-Minerals (MULTIVITAMIN WITH MINERALS) tablet Take 1 tablet by mouth daily.  . polyethylene glycol (MIRALAX / GLYCOLAX) packet Take 17 g by mouth every evening.  . ranitidine (ZANTAC) 150 MG tablet Take 150 mg by mouth daily.  . sennosides-docusate sodium (SENOKOT-S) 8.6-50 MG tablet Take 1 tablet by mouth at bedtime.  . simvastatin (ZOCOR) 10 MG tablet Take 10 mg by mouth at bedtime.  . traMADol (ULTRAM) 50 MG tablet Take 2 tablets (100 mg total) by mouth at bedtime.   No facility-administered encounter medications on file as of 12/22/2017.     Review of Systems   This is limited secondary to dementia she does not appear to complain of any significant eye discomfort or visual changes.  She is not complaining of shortness of breath or cough.    Immunization History  Administered Date(s) Administered  . Influenza,inj,Quad PF,6+ Mos 05/04/2015  . Influenza-Unspecified 05/28/2016, 06/03/2017  . Pneumococcal Conjugate-13 06/29/2016  . Pneumococcal-Unspecified 06/10/2016  . Tdap 05/12/2017   Pertinent  Health Maintenance Due  Topic Date Due  . INFLUENZA VACCINE  03/30/2018  . DEXA SCAN  Completed  . PNA vac Low Risk Adult  Completed   Fall Risk  05/09/2017  Falls in the past year? No   Functional Status Survey:      Physical Exam   She is afebrile pulse is 64- respirations of 18 blood pressure manually 112/80  In general this is a pleasant elderly female who appears to be at her baseline no sign of distress.  Her skin is warm and dry.  Eyes does appear to have a small amount of drainage somewhat serous appearing --bilaterally--- I do not note any changes in visual acuity pupils appear to be equal round reactive to light visual acuity appears to be intact I do not see any surrounding orbital edema or erythema   Chest is clear to auscultation with somewhat shallow air  entry there is no labored breathing.  Heart is regular rate and rhythm with baseline systolic murmur she has baseline venous stasis edema.  Abdomen is soft nontender with positive bowel sounds.  Musculoskeletal is all her extremities at baseline with continued lower extremity weakness--history of contractures of the right fingers at baseline.   Neurologic she is alert.  Psych she is oriented to self follow simple verbal commands continues to be pleasant smiling cooperative  Labs reviewed: Recent Labs    08/10/17 0800 09/26/17 1551 12/07/17 0804  NA 138 138 142  K 4.0 4.0 3.7  CL 106 103 106  CO2 23 24 26   GLUCOSE 87 125* 87  BUN 31* 18 31*  CREATININE 0.91 0.85 0.81  CALCIUM 8.9 9.0 8.5*   Recent Labs    12/27/16 0900 01/25/17 0715 08/10/17 0800  AST 20 19 19   ALT 11* 11* 11*  ALKPHOS 47 48 53  BILITOT 0.2* 0.5 0.4  PROT 7.2 7.2  7.6  ALBUMIN 3.0* 3.0* 3.4*   Recent Labs    08/24/17 0945 09/26/17 1551 12/07/17 0804  WBC 5.7 7.7 3.9*  NEUTROABS 2.3 4.3 1.5*  HGB 11.7* 11.2* 10.6*  HCT 37.8 35.3* 34.0*  MCV 90.9 90.1 91.6  PLT 268 264 227   Lab Results  Component Value Date   TSH 2.239 12/07/2017   No results found for: HGBA1C Lab Results  Component Value Date   CHOL 115 08/10/2017   HDL 50 08/10/2017   LDLCALC 51 08/10/2017   TRIG 70 08/10/2017   CHOLHDL 2.3 08/10/2017    Significant Diagnostic Results in last 30 days:  No results found.  Assessment/Plan  #1 suspected conjunctivitis- will treat empirically with Cipro ophthalmologic solution 2 drops 3 times daily x7 days and monitor if no resolution will need to readdress.  Also patient is on artificial tears apparently 4 times a day but apparently--per nursing- she finds this irritating and uncomfortable will reduce this to twice daily and monitor.  --[id number]

## 2017-12-25 ENCOUNTER — Encounter: Payer: Self-pay | Admitting: Internal Medicine

## 2018-01-31 ENCOUNTER — Encounter: Payer: Self-pay | Admitting: Internal Medicine

## 2018-01-31 ENCOUNTER — Non-Acute Institutional Stay (SKILLED_NURSING_FACILITY): Payer: Medicare Other | Admitting: Internal Medicine

## 2018-01-31 DIAGNOSIS — R05 Cough: Secondary | ICD-10-CM | POA: Diagnosis not present

## 2018-01-31 DIAGNOSIS — Z8709 Personal history of other diseases of the respiratory system: Secondary | ICD-10-CM | POA: Diagnosis not present

## 2018-01-31 DIAGNOSIS — R059 Cough, unspecified: Secondary | ICD-10-CM

## 2018-01-31 DIAGNOSIS — R0989 Other specified symptoms and signs involving the circulatory and respiratory systems: Secondary | ICD-10-CM

## 2018-01-31 NOTE — Progress Notes (Signed)
This is an acute visit.  Level of CARE skilled.  Facility is [facility].  Chief complaint acute visit secondary to cold and congestion symptoms.  History of present illness.  Patient is a pleasant 84 year old female seen today for family concerns of increased cough and congestion.  She has developed these periodically and usually responds well to Mucinex sometimes requires a short course of prednisone and an antibiotic.  She also has duo nebs as needed.  Her other diagnoses include dementia as well as hypertension-history of CVA-anemia as well as hypothyroidism and osteoporosis as well as osteoarthritis.  Currently she is sitting in her wheelchair comfortably vital signs appear to be stable she is afebrile does not appear to be in any distress but has been coughing according to family  Past Medical History:  Diagnosis Date  . Anemia   . Arthritis   . Bradycardia   . Cancer [facility])    had breast cancer 8 yrs ago  . 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 GERD (gastroesophageal reflux disease)   . HCAP (healthcare-associated pneumonia)   . Hypertension   . Hypokalemia   . Hypomagnesemia   . Osteoarthritis   . Pancreatitis, acute 05/03/2015  . Thyroid disease   . Urinary tract infection 02/07/16   Coag negative staph. Nitrofurantoin prescribed   . UTI (lower urinary tract infection) 04/03/2016   Proteus treated with Septra DS        Past Surgical History:  Procedure Laterality Date  . BREAST SURGERY    . ERCP N/A 01/22/2016   Procedure: ENDOSCOPIC RETROGRADE CHOLANGIOPANCREATOGRAPHY (ERCP) With biliary sphincterotomy and stone extraction ;  Surgeon: Rogene Houston, MD;  Location: [facility] ORS;  Service: Endoscopy;  Laterality: N/A;  . ESOPHAGOGASTRODUODENOSCOPY N/A 05/07/2015   Procedure: ESOPHAGOGASTRODUODENOSCOPY (EGD);  Surgeon: Rogene Houston, MD;  Location: [facility] ENDO SUITE;  Service: Endoscopy;  Laterality: N/A;        Allergies    Allergen Reactions  . Codeine Nausea And Vomiting  . Nsaids       MEDICATIONS     Medication Sig  . acetaminophen (TYLENOL) 325 MG tablet Take 650 mg by mouth every 6 (six) hours as needed.  Marland Kitchen alendronate (FOSAMAX) 70 MG tablet Take 70 mg by mouth once a week. Take with a full glass of water on an empty stomach.  Roseanne Kaufman Peru-Castor Oil (VENELEX) OINT Apply to sacrum and bilateral buttock every shift and as needed for prevention  . carboxymethylcellulose (REFRESH PLUS) 0.5 % SOLN Place 1 drop into both eyes 4 (four) times daily.  . Cholecalciferol 1000 units TBDP Take 1 tablet by mouth daily  . cloNIDine (CATAPRES) 0.1 MG tablet Take 0.1 mg by mouth 2 (two) times daily.  . ferrous sulfate (FERROUSUL) 325 (65 FE) MG tablet Take 325 mg by mouth daily with breakfast.  . fluticasone ([employer]S FLUTICASONE PROPIONATE) 50 MCG/ACT nasal spray Place 1 spray into both nostrils 2 (two) times daily.  Marland Kitchen ipratropium-albuterol (DUONEB) 0.5-2.5 (3) MG/3ML SOLN Take 3 mLs by nebulization every 6 (six) hours as needed.  Marland Kitchen levothyroxine (SYNTHROID, LEVOTHROID) 175 MCG tablet Take 175 mcg by mouth daily before breakfast.  . losartan (COZAAR) 50 MG tablet Take 50 mg by mouth daily.  . Multiple Vitamins-Minerals (MULTIVITAMIN WITH MINERALS) tablet Take 1 tablet by mouth daily.  . polyethylene glycol (MIRALAX / GLYCOLAX) packet Take 17 g by mouth every evening.  . ranitidine (ZANTAC) 150 MG tablet Take 150 mg by mouth  daily.  . sennosides-docusate sodium (SENOKOT-S) 8.6-50 MG tablet Take 1 tablet by mouth at bedtime.  . simvastatin (ZOCOR) 10 MG tablet Take 10 mg by mouth at bedtime.  . traMADol (ULTRAM) 50 MG tablet Take 2 tablets (100 mg total) by mouth at bedtime.  She is also on Flonase twice daily   .     Review of Systems   This is limited secondary to dementia  She has had some coughing apparently of whitish-grayish phlegm per family- she also usually has a good appetite but has not eaten  quite as well the last day or so according to family.        Immunization History  Administered Date(s) Administered  . Influenza,inj,Quad PF,6+ Mos 05/04/2015  . Influenza-Unspecified 05/28/2016, 06/03/2017  . Pneumococcal Conjugate-13 06/29/2016  . Pneumococcal-Unspecified 06/10/2016  . Tdap 05/12/2017       Pertinent  Health Maintenance Due  Topic Date Due  . INFLUENZA VACCINE  03/30/2018  . DEXA SCAN  Completed  . PNA vac Low Risk Adult  Completed   Fall Risk  05/09/2017  Falls in the past year? No    Physical exam. She is afebrile pulse of 80 respirations of 17 blood pressure 130/92.  In general this is a pleasant elderly female who appears relatively at her baseline sitting comfortably in her wheelchair.  Her skin is warm and dry.  Eyes appears to have a small amount of clear drainage bilaterally visual acuity appears to be grossly intact sclera and conjunctive are relatively clear.  Oropharynx is clear mucous membranes moist.  Chest  does have somewhat diffuse coarse breath sounds on expiration there is no labored breathing  Abdomen is soft nontender with positive bowel sounds.  Musculoskeletal she has baseline lower extremity edema hose are in place-does have baseline lower extremity weakness- moves her extremities at baseline.--Does have contractures of her right fingers.  Neurologic she is alert cranial nerves appear to be grossly intact she does not speak much  Psych she is oriented to self she is pleasant cooperative follow simple verbal commands  Labs.  December 07, 2017.  TSH 2.239.  Sodium 142 potassium 3.7 BUN 31 creatinine 0.81.  WBC 3.9 hemoglobin 10.6 platelets 227.  Assessment plan.  Cough with chest congestion-patient is at risk here with a history of respiratory issues- will treat aggressively with Mucinex 600 mg twice daily for 7 days- will make duo nebs routine every 6 hours x3 days and then as needed.  Will start her on a short  course of prednisone 10 mg daily for 3 days.  Also will start her on an antibiotic she has tolerated Levaquin well in the past will start her on 500 mg initially for 1 day and then 250 mg for 6 additional days.  Also will start her on a probiotic twice daily for 10 days.  Will monitor vital signs pulse ox to shift for 72 hours- and will obtain a chest x-ray as well.  Of note she continues on Flonase as well with a history of allergic rhinitis which may be contributing to this.  [id number]

## 2018-02-01 ENCOUNTER — Encounter (HOSPITAL_COMMUNITY)
Admission: RE | Admit: 2018-02-01 | Discharge: 2018-02-01 | Disposition: A | Discharge status: Home / Self Care | Payer: Medicare Other | Source: Skilled Nursing Facility | Attending: Internal Medicine | Admitting: Internal Medicine

## 2018-02-01 DIAGNOSIS — M810 Age-related osteoporosis without current pathological fracture: Secondary | ICD-10-CM | POA: Insufficient documentation

## 2018-02-01 DIAGNOSIS — M6281 Muscle weakness (generalized): Secondary | ICD-10-CM | POA: Insufficient documentation

## 2018-02-01 DIAGNOSIS — A5430 Gonococcal infection of eye, unspecified: Secondary | ICD-10-CM | POA: Diagnosis not present

## 2018-02-01 DIAGNOSIS — R05 Cough: Secondary | ICD-10-CM | POA: Insufficient documentation

## 2018-02-01 DIAGNOSIS — K219 Gastro-esophageal reflux disease without esophagitis: Secondary | ICD-10-CM | POA: Insufficient documentation

## 2018-02-01 DIAGNOSIS — R279 Unspecified lack of coordination: Secondary | ICD-10-CM | POA: Diagnosis not present

## 2018-02-01 LAB — CBC WITH DIFFERENTIAL/PLATELET
BASOS ABS: 0 10*3/uL (ref 0.0–0.1)
BASOS PCT: 0 %
EOS PCT: 2 %
Eosinophils Absolute: 0.1 10*3/uL (ref 0.0–0.7)
HCT: 35 % — ABNORMAL LOW (ref 36.0–46.0)
Hemoglobin: 11 g/dL — ABNORMAL LOW (ref 12.0–15.0)
LYMPHS PCT: 29 %
Lymphs Abs: 1.3 10*3/uL (ref 0.7–4.0)
MCH: 28.6 pg (ref 26.0–34.0)
MCHC: 31.4 g/dL (ref 30.0–36.0)
MCV: 90.9 fL (ref 78.0–100.0)
Monocytes Absolute: 1.1 10*3/uL — ABNORMAL HIGH (ref 0.1–1.0)
Monocytes Relative: 24 %
Neutro Abs: 2 10*3/uL (ref 1.7–7.7)
Neutrophils Relative %: 45 %
PLATELETS: 213 10*3/uL (ref 150–400)
RBC: 3.85 MIL/uL — ABNORMAL LOW (ref 3.87–5.11)
RDW: 14.2 % (ref 11.5–15.5)
WBC: 4.5 10*3/uL (ref 4.0–10.5)

## 2018-02-01 LAB — BASIC METABOLIC PANEL
ANION GAP: 8 (ref 5–15)
BUN: 21 mg/dL — ABNORMAL HIGH (ref 6–20)
CALCIUM: 8.8 mg/dL — AB (ref 8.9–10.3)
CO2: 27 mmol/L (ref 22–32)
Chloride: 104 mmol/L (ref 101–111)
Creatinine, Ser: 0.89 mg/dL (ref 0.44–1.00)
GFR, EST NON AFRICAN AMERICAN: 54 mL/min — AB (ref >60)
GLUCOSE: 96 mg/dL (ref 65–99)
POTASSIUM: 3.7 mmol/L (ref 3.5–5.1)
Sodium: 139 mmol/L (ref 135–145)

## 2018-02-03 ENCOUNTER — Encounter: Payer: Self-pay | Admitting: Internal Medicine

## 2018-02-07 DIAGNOSIS — M6281 Muscle weakness (generalized): Secondary | ICD-10-CM | POA: Diagnosis not present

## 2018-02-07 DIAGNOSIS — R279 Unspecified lack of coordination: Secondary | ICD-10-CM | POA: Diagnosis not present

## 2018-02-07 DIAGNOSIS — M1990 Unspecified osteoarthritis, unspecified site: Secondary | ICD-10-CM | POA: Diagnosis not present

## 2018-02-07 DIAGNOSIS — F0390 Unspecified dementia without behavioral disturbance: Secondary | ICD-10-CM | POA: Diagnosis not present

## 2018-02-08 ENCOUNTER — Non-Acute Institutional Stay (SKILLED_NURSING_FACILITY): Payer: Medicare Other | Admitting: Internal Medicine

## 2018-02-08 ENCOUNTER — Encounter: Payer: Self-pay | Admitting: Internal Medicine

## 2018-02-08 DIAGNOSIS — R05 Cough: Secondary | ICD-10-CM | POA: Diagnosis not present

## 2018-02-08 DIAGNOSIS — M1990 Unspecified osteoarthritis, unspecified site: Secondary | ICD-10-CM | POA: Diagnosis not present

## 2018-02-08 DIAGNOSIS — R059 Cough, unspecified: Secondary | ICD-10-CM

## 2018-02-08 DIAGNOSIS — M6281 Muscle weakness (generalized): Secondary | ICD-10-CM | POA: Diagnosis not present

## 2018-02-08 DIAGNOSIS — F0390 Unspecified dementia without behavioral disturbance: Secondary | ICD-10-CM | POA: Diagnosis not present

## 2018-02-08 DIAGNOSIS — Z8709 Personal history of other diseases of the respiratory system: Secondary | ICD-10-CM | POA: Diagnosis not present

## 2018-02-08 DIAGNOSIS — R279 Unspecified lack of coordination: Secondary | ICD-10-CM | POA: Diagnosis not present

## 2018-02-08 NOTE — Progress Notes (Signed)
Location:   [facility] Room Number: 157/W Place of Service:  SNF [phone]) Provider:  [name], MD  Patient Care Team: [name], MD as PCP - General (Internal Medicine)  Extended Emergency Contact Information Primary Emergency Contact: [name] Address: [address]          [place],  [place] Home Phone: [phone] Relation: None Secondary Emergency Contact: [place], [place] [address] [name] of [place] Phone: [phone] Relation: Grandson  Code Status:  DNR Goals of care: Advanced Directive information Advanced Directives 02/08/2018  Does Patient Have a Medical Advance Directive? Yes  Type of Advance Directive Out of facility DNR (pink MOST or yellow form)  Does patient want to make changes to medical advance directive? No - Patient declined  Copy of [place] in Chart? No - copy requested  Would patient like information on creating a medical advance directive? -  Pre-existing out of facility DNR order (yellow form or pink MOST form) -     Chief Complaint  Patient presents with  . Acute Visit    Patient being sen for Upper Respiratory problem per family request    HPI:  Pt is a 84 y.o. female seen today for an acute visit for patient is having a reoccurrence of a cold-respiratory's symptoms.  Facility and has a history of dementia as well as hypertension-CVA-anemia-hypothyroidism and osteoporosis as well as osteoarthritis.  She was seen last week for respiratory issues with increased cough and congestion and responded well to a short course of prednisone as well as duo nebs and Mucinex-she was placed empirically on Levaquin-  x-ray did not really show any acute process.--Labs  did not show any elevated white count and were reassuring  Apparently she improved but her family thinks that the last few days she has had some increased cold-like symptoms- they say they  first noticed this when she was out the facility for short time.  Patient is a poor historian secondary to dementia.   Vital signs appear to be stable she is afebrile   Past Medical History:  Diagnosis Date  . Anemia   . Arthritis   . Bradycardia   . Cancer [facility])    had breast cancer 8 yrs ago  . 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 GERD (gastroesophageal reflux disease)   . HCAP (healthcare-associated pneumonia)   . Hypertension   . Hypokalemia   . Hypomagnesemia   . Osteoarthritis   . Pancreatitis, acute 05/03/2015  . Thyroid disease   . Urinary tract infection 02/07/16   Coag negative staph. Nitrofurantoin prescribed   . UTI (lower urinary tract infection) 04/03/2016   Proteus treated with Septra DS   Past Surgical History:  Procedure Laterality Date  . BREAST SURGERY    . ERCP N/A 01/22/2016   Procedure: ENDOSCOPIC RETROGRADE CHOLANGIOPANCREATOGRAPHY (ERCP) With biliary sphincterotomy and stone extraction ;  Surgeon: Rogene Houston, MD;  Location: [facility] ORS;  Service: Endoscopy;  Laterality: N/A;  . ESOPHAGOGASTRODUODENOSCOPY N/A 05/07/2015   Procedure: ESOPHAGOGASTRODUODENOSCOPY (EGD);  Surgeon: Rogene Houston, MD;  Location: [facility] ENDO SUITE;  Service: Endoscopy;  Laterality: N/A;    Allergies  Allergen Reactions  . Codeine Nausea And Vomiting  . Nsaids     Outpatient Encounter Medications as of 02/08/2018  Medication Sig  . acetaminophen (TYLENOL) 325 MG tablet Take 650 mg by  mouth every 6 (six) hours as needed.  Marland Kitchen alendronate (FOSAMAX) 70 MG tablet Take 70 mg by mouth once a week. Take with a full glass of water on an empty stomach.  Roseanne Kaufman Peru-Castor Oil (VENELEX) OINT Apply to sacrum and bilateral buttock every shift and as needed for prevention  . Cholecalciferol 1000 units TBDP Take 1 tablet by mouth daily  . cloNIDine (CATAPRES) 0.1 MG tablet Take 0.1 mg by mouth 2 (two) times daily.  . ferrous sulfate (FERROUSUL) 325 (65 FE) MG tablet  Take 325 mg by mouth daily with breakfast.  . fluticasone ([employer]S FLUTICASONE PROPIONATE) 50 MCG/ACT nasal spray Place 1 spray into both nostrils 2 (two) times daily.  Marland Kitchen ipratropium-albuterol (DUONEB) 0.5-2.5 (3) MG/3ML SOLN Take 3 mLs by nebulization every 6 (six) hours as needed.  Marland Kitchen levothyroxine (SYNTHROID, LEVOTHROID) 175 MCG tablet Take 175 mcg by mouth daily before breakfast.  . losartan (COZAAR) 50 MG tablet Take 50 mg by mouth daily.  . Multiple Vitamins-Minerals (MULTIVITAMIN WITH MINERALS) tablet Take 1 tablet by mouth daily.  . polyethylene glycol (MIRALAX / GLYCOLAX) packet Take 17 g by mouth every evening.  . Probiotic Product (RISA-BID PROBIOTIC) TABS Take 1capsule by mouth twice a day  . ranitidine (ZANTAC) 150 MG tablet Take 150 mg by mouth daily.  . sennosides-docusate sodium (SENOKOT-S) 8.6-50 MG tablet Take 1 tablet by mouth at bedtime.  . simvastatin (ZOCOR) 10 MG tablet Take 10 mg by mouth at bedtime.  . traMADol (ULTRAM) 50 MG tablet Take 2 tablets (100 mg total) by mouth at bedtime.  . [DISCONTINUED] carboxymethylcellulose (REFRESH PLUS) 0.5 % SOLN Place 1 drop into both eyes 4 (four) times daily.   No facility-administered encounter medications on file as of 02/08/2018.     Review of Systems   This is limited secondary to dementia- per family she does have some coughing and cold-like symptoms but is eating well Immunization History  Administered Date(s) Administered  . Influenza,inj,Quad PF,6+ Mos 05/04/2015  . Influenza-Unspecified 05/28/2016, 06/03/2017  . Pneumococcal Conjugate-13 06/29/2016  . Pneumococcal-Unspecified 06/10/2016  . Tdap 05/12/2017   Pertinent  Health Maintenance Due  Topic Date Due  . INFLUENZA VACCINE  03/30/2018  . DEXA SCAN  Completed  . PNA vac Low Risk Adult  Completed   Fall Risk  05/09/2017  Falls in the past year? No   Functional Status Survey:     She is afebrile pulse of 80 respirations of 16 blood pressure 126/94 Physical  Exam   In general this is a pleasant elderly female in no distress lying comfortably in bed.  Her skin is warm and dry.  Eyes sclera and conjunctiva appear to be clear I do not note any significant drainage.  Visual acuity appears to be at baseline.  Oropharynx is clear mucous membranes moist.  Chest has somewhat shallow air entry some slight wheezing at the bases this is improved from previous exam.  Heart is regular rate and rhythm without murmur gallop or rub.  Abdomen is soft nontender with positive bowel sounds.  Musculoskeletal has baseline lower extremity venous stasis edema- moves her extremities at baseline with baseline contractures of her right fingers and holding her arms in a somewhat contracted position.  Neurologic she is alert cranial nerves appear to be intact she is not speaking much could not appreciate any lateralizing findings.  Psych she is oriented to self is pleasant alert at her baseline  Labs reviewed: Recent Labs    09/26/17 1551 12/07/17 0804  02/01/18 0710  NA 138 142 139  K 4.0 3.7 3.7  CL 103 106 104  CO2 24 26 27   GLUCOSE 125* 87 96  BUN 18 31* 21*  CREATININE 0.85 0.81 0.89  CALCIUM 9.0 8.5* 8.8*   Recent Labs    08/10/17 0800  AST 19  ALT 11*  ALKPHOS 53  BILITOT 0.4  PROT 7.6  ALBUMIN 3.4*   Recent Labs    09/26/17 1551 12/07/17 0804 02/01/18 0710  WBC 7.7 3.9* 4.5  NEUTROABS 4.3 1.5* 2.0  HGB 11.2* 10.6* 11.0*  HCT 35.3* 34.0* 35.0*  MCV 90.1 91.6 90.9  PLT 264 227 213   Lab Results  Component Value Date   TSH 2.239 12/07/2017   No results found for: HGBA1C Lab Results  Component Value Date   CHOL 115 08/10/2017   HDL 50 08/10/2017   LDLCALC 51 08/10/2017   TRIG 70 08/10/2017   CHOLHDL 2.3 08/10/2017    Significant Diagnostic Results in last 30 days:  No results found.  Assessment/Plan  #1 possible Netty Starring has completed a 7-day course of Levaquin- exam today appeared to be improved but family is  concerned that she may be having recurrence of cold-like symptoms and cough.  Will make DuoNeb's routine every 8 hours for now- start Muccinex--600 mg twice daily for 5 days.  Also will restart prednisone for a short course.  Monitor vital signs pulse ox every shift for 72 hours notify provider of any increased cough congestion or elevated temperature.  Clinically she appears stable family is concerned that developing a URI-hesitant to restart antibiotics secondary to side effects of long-term antibiotic use including risk of C. difficile-- however if she develops temperature has increased cough and congestion certainly will reconsider-- this was discussed with Dr. Neoma Laming also discussed this with her daughter who expressed understanding  [id number]--of note greater than 25 minutes spent assessing patient-reviewing her chart and labs- discussing her status with her daughter at bedside- and coordinating and formulating a plan of care- of note greater than 50% of time spent coordinating plan of care with input as noted above

## 2018-02-10 ENCOUNTER — Other Ambulatory Visit: Payer: Self-pay

## 2018-02-10 MED ORDER — TRAMADOL HCL 50 MG PO TABS: 100.0000 mg | ORAL_TABLET | Freq: Every day | ORAL | 0 refills | Status: DC

## 2018-02-10 NOTE — Telephone Encounter (Signed)
RX Fax for [facility]@ [phone]  

## 2018-02-13 DIAGNOSIS — R279 Unspecified lack of coordination: Secondary | ICD-10-CM | POA: Diagnosis not present

## 2018-02-13 DIAGNOSIS — M6281 Muscle weakness (generalized): Secondary | ICD-10-CM | POA: Diagnosis not present

## 2018-02-13 DIAGNOSIS — F0390 Unspecified dementia without behavioral disturbance: Secondary | ICD-10-CM | POA: Diagnosis not present

## 2018-02-13 DIAGNOSIS — M1990 Unspecified osteoarthritis, unspecified site: Secondary | ICD-10-CM | POA: Diagnosis not present

## 2018-02-14 DIAGNOSIS — R279 Unspecified lack of coordination: Secondary | ICD-10-CM | POA: Diagnosis not present

## 2018-02-14 DIAGNOSIS — M6281 Muscle weakness (generalized): Secondary | ICD-10-CM | POA: Diagnosis not present

## 2018-02-14 DIAGNOSIS — M1990 Unspecified osteoarthritis, unspecified site: Secondary | ICD-10-CM | POA: Diagnosis not present

## 2018-02-14 DIAGNOSIS — F0390 Unspecified dementia without behavioral disturbance: Secondary | ICD-10-CM | POA: Diagnosis not present

## 2018-02-15 DIAGNOSIS — M6281 Muscle weakness (generalized): Secondary | ICD-10-CM | POA: Diagnosis not present

## 2018-02-15 DIAGNOSIS — F0390 Unspecified dementia without behavioral disturbance: Secondary | ICD-10-CM | POA: Diagnosis not present

## 2018-02-15 DIAGNOSIS — M1990 Unspecified osteoarthritis, unspecified site: Secondary | ICD-10-CM | POA: Diagnosis not present

## 2018-02-15 DIAGNOSIS — R279 Unspecified lack of coordination: Secondary | ICD-10-CM | POA: Diagnosis not present

## 2018-02-16 DIAGNOSIS — R279 Unspecified lack of coordination: Secondary | ICD-10-CM | POA: Diagnosis not present

## 2018-02-16 DIAGNOSIS — M1990 Unspecified osteoarthritis, unspecified site: Secondary | ICD-10-CM | POA: Diagnosis not present

## 2018-02-16 DIAGNOSIS — M6281 Muscle weakness (generalized): Secondary | ICD-10-CM | POA: Diagnosis not present

## 2018-02-16 DIAGNOSIS — F0390 Unspecified dementia without behavioral disturbance: Secondary | ICD-10-CM | POA: Diagnosis not present

## 2018-02-17 DIAGNOSIS — R279 Unspecified lack of coordination: Secondary | ICD-10-CM | POA: Diagnosis not present

## 2018-02-17 DIAGNOSIS — F0390 Unspecified dementia without behavioral disturbance: Secondary | ICD-10-CM | POA: Diagnosis not present

## 2018-02-17 DIAGNOSIS — M1990 Unspecified osteoarthritis, unspecified site: Secondary | ICD-10-CM | POA: Diagnosis not present

## 2018-02-17 DIAGNOSIS — M6281 Muscle weakness (generalized): Secondary | ICD-10-CM | POA: Diagnosis not present

## 2018-02-20 DIAGNOSIS — R279 Unspecified lack of coordination: Secondary | ICD-10-CM | POA: Diagnosis not present

## 2018-02-20 DIAGNOSIS — F0390 Unspecified dementia without behavioral disturbance: Secondary | ICD-10-CM | POA: Diagnosis not present

## 2018-02-20 DIAGNOSIS — M1990 Unspecified osteoarthritis, unspecified site: Secondary | ICD-10-CM | POA: Diagnosis not present

## 2018-02-20 DIAGNOSIS — M6281 Muscle weakness (generalized): Secondary | ICD-10-CM | POA: Diagnosis not present

## 2018-02-20 DIAGNOSIS — R05 Cough: Secondary | ICD-10-CM | POA: Diagnosis not present

## 2018-02-21 ENCOUNTER — Non-Acute Institutional Stay (SKILLED_NURSING_FACILITY): Payer: Medicare Other | Admitting: Internal Medicine

## 2018-02-21 ENCOUNTER — Encounter: Payer: Self-pay | Admitting: Internal Medicine

## 2018-02-21 DIAGNOSIS — I10 Essential (primary) hypertension: Secondary | ICD-10-CM

## 2018-02-21 DIAGNOSIS — F0390 Unspecified dementia without behavioral disturbance: Secondary | ICD-10-CM | POA: Diagnosis not present

## 2018-02-21 DIAGNOSIS — M1990 Unspecified osteoarthritis, unspecified site: Secondary | ICD-10-CM | POA: Diagnosis not present

## 2018-02-21 DIAGNOSIS — G309 Alzheimer's disease, unspecified: Secondary | ICD-10-CM

## 2018-02-21 DIAGNOSIS — J411 Mucopurulent chronic bronchitis: Secondary | ICD-10-CM | POA: Diagnosis not present

## 2018-02-21 DIAGNOSIS — E039 Hypothyroidism, unspecified: Secondary | ICD-10-CM | POA: Diagnosis not present

## 2018-02-21 DIAGNOSIS — R279 Unspecified lack of coordination: Secondary | ICD-10-CM | POA: Diagnosis not present

## 2018-02-21 DIAGNOSIS — K219 Gastro-esophageal reflux disease without esophagitis: Secondary | ICD-10-CM | POA: Diagnosis not present

## 2018-02-21 DIAGNOSIS — M6281 Muscle weakness (generalized): Secondary | ICD-10-CM | POA: Diagnosis not present

## 2018-02-21 DIAGNOSIS — J42 Unspecified chronic bronchitis: Secondary | ICD-10-CM | POA: Insufficient documentation

## 2018-02-21 DIAGNOSIS — F0281 Dementia in other diseases classified elsewhere with behavioral disturbance: Secondary | ICD-10-CM | POA: Diagnosis not present

## 2018-02-21 DIAGNOSIS — M810 Age-related osteoporosis without current pathological fracture: Secondary | ICD-10-CM

## 2018-02-21 NOTE — Progress Notes (Signed)
Location:   [facility] Room Number: 157/W Place of Service:  SNF [phone]) Provider:  Geralyn Flash, MD  Patient Care Team: [name], MD as PCP - General (Internal Medicine)  Extended Emergency Contact Information Primary Emergency Contact: [name] Address: [address]          [place],  [place] Home Phone: [phone] Relation: None Secondary Emergency Contact: [place], [place] [address] [name] of [place] Phone: [phone] Relation: Grandson  Code Status:  DNR Goals of care: Advanced Directive information Advanced Directives 02/21/2018  Does Patient Have a Medical Advance Directive? Yes  Type of Advance Directive Out of facility DNR (pink MOST or yellow form)  Does patient want to make changes to medical advance directive? No - Patient declined  Copy of [place] in Chart? No - copy requested  Would patient like information on creating a medical advance directive? -  Pre-existing out of facility DNR order (yellow form or pink MOST form) -     Chief Complaint  Patient presents with  . Medical Management of Chronic Issues    Routine Visit    HPI:  Pt is a 84 y.o. female seen today for medical management of chronic diseases.   Patient has H/O Dementia, hypothyroidism, Hypertension and anemiaand Osteoporosis.. Patient is Long term resident of facility. Patient is doing well in facility.  Family comes every day and brings food for her.  She has gained another 5 pounds and is now up to 148 lbs. Her only problem is recurrent bronchitis.  Her chest x-ray done few days ago was negative.  She was recently treated with bronchodilators and prednisone and antibiotics. She is doing well very well and seems like her cough has resolved. Patient is alert but unable to give any history due to her dementia. Per nurses she does not have any new issues   Past Medical History:    Diagnosis Date  . Anemia   . Arthritis   . Bradycardia   . Cancer [facility])    had breast cancer 8 yrs ago  . 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 GERD (gastroesophageal reflux disease)   . HCAP (healthcare-associated pneumonia)   . Hypertension   . Hypokalemia   . Hypomagnesemia   . Osteoarthritis   . Pancreatitis, acute 05/03/2015  . Thyroid disease   . Urinary tract infection 02/07/16   Coag negative staph. Nitrofurantoin prescribed   . UTI (lower urinary tract infection) 04/03/2016   Proteus treated with Septra DS   Past Surgical History:  Procedure Laterality Date  . BREAST SURGERY    . ERCP N/A 01/22/2016   Procedure: ENDOSCOPIC RETROGRADE CHOLANGIOPANCREATOGRAPHY (ERCP) With biliary sphincterotomy and stone extraction ;  Surgeon: Rogene Houston, MD;  Location: [facility]S;  Service: Endoscopy;  Laterality: N/A;  . ESOPHAGOGASTRODUODENOSCOPY N/A 05/07/2015   Procedure: ESOPHAGOGASTRODUODENOSCOPY (EGD);  Surgeon: Rogene Houston, MD;  Location: [facility] ENDO [facility];  Service: Endoscopy;  Laterality: N/A;    Allergies  Allergen Reactions  . Codeine Nausea And Vomiting  . Nsaids     Outpatient Encounter Medications as of 02/21/2018  Medication Sig  . acetaminophen (TYLENOL) 325 MG tablet Take 650 mg by mouth every 6 (six) hours as needed.  Marland Kitchen alendronate (FOSAMAX) 70 MG tablet Take 70 mg by mouth once a week. Take with a full glass of water on  an empty stomach.  Roseanne Kaufman Peru-Castor Oil (VENELEX) OINT Apply to sacrum and bilateral buttock every shift and as needed for prevention  . Cholecalciferol 1000 units TBDP Take 1 tablet by mouth daily  . cloNIDine (CATAPRES) 0.1 MG tablet Take 0.1 mg by mouth 2 (two) times daily.  . ferrous sulfate (FERROUSUL) 325 (65 FE) MG tablet Take 325 mg by mouth daily with breakfast.  . fluticasone ([employer]S FLUTICASONE PROPIONATE) 50 MCG/ACT nasal spray Place 1 spray into both nostrils 2 (two) times daily.  Marland Kitchen guaiFENesin (MUCINEX) 600 MG 12  hr tablet Take 600 mg by mouth 2 (two) times daily.  Marland Kitchen ipratropium-albuterol (DUONEB) 0.5-2.5 (3) MG/3ML SOLN Take 3 mLs by nebulization every 6 (six) hours as needed.  Marland Kitchen ipratropium-albuterol (DUONEB) 0.5-2.5 (3) MG/3ML SOLN Take 3 mLs by nebulization every 8 (eight) hours.  Marland Kitchen levothyroxine (SYNTHROID, LEVOTHROID) 175 MCG tablet Take 175 mcg by mouth daily before breakfast.  . losartan (COZAAR) 50 MG tablet Take 50 mg by mouth daily.  . Multiple Vitamins-Minerals (MULTIVITAMIN WITH MINERALS) tablet Take 1 tablet by mouth daily.  . polyethylene glycol (MIRALAX / GLYCOLAX) packet Take 17 g by mouth every evening.  . ranitidine (ZANTAC) 150 MG tablet Take 150 mg by mouth daily.  . sennosides-docusate sodium (SENOKOT-S) 8.6-50 MG tablet Take 1 tablet by mouth at bedtime.  . simvastatin (ZOCOR) 10 MG tablet Take 10 mg by mouth at bedtime.  . traMADol (ULTRAM) 50 MG tablet Take 2 tablets (100 mg total) by mouth at bedtime.  . [DISCONTINUED] Probiotic Product (RISA-BID PROBIOTIC) TABS Take 1capsule by mouth twice a day   No facility-administered encounter medications on file as of 02/21/2018.      Review of Systems  Unable to perform ROS: Dementia    Immunization History  Administered Date(s) Administered  . Influenza,inj,Quad PF,6+ Mos 05/04/2015  . Influenza-Unspecified 05/28/2016, 06/03/2017  . Pneumococcal Conjugate-13 06/29/2016  . Pneumococcal-Unspecified 06/10/2016  . Tdap 05/12/2017   Pertinent  Health Maintenance Due  Topic Date Due  . INFLUENZA VACCINE  03/30/2018  . DEXA SCAN  Completed  . PNA vac Low Risk Adult  Completed   Fall Risk  05/09/2017  Falls in the past year? No   Functional Status Survey:    Vitals:   02/21/18 0917  BP: 123/69  Pulse: 78  Resp: 20  Temp: 98 F (36.7 C)  TempSrc: Oral  SpO2: 97%  Weight: 148 lb 3.2 oz (67.2 kg)  Height: 5\' 4"  (1.626 m)   Body mass index is 25.44 kg/m. Physical Exam  Constitutional: She appears well-developed and  well-nourished.  HENT:  Head: Normocephalic.  Mouth/Throat: Oropharynx is clear and moist.  Eyes: Pupils are equal, round, and reactive to light.  Neck: Neck supple.  Cardiovascular: Normal rate.  Murmur heard. Pulmonary/Chest: Effort normal and breath sounds normal. No respiratory distress. She has no wheezes. She has no rales.  Abdominal: Soft. Bowel sounds are normal. She exhibits no distension. There is no tenderness. There is no rebound.  Musculoskeletal:  Trace edema Bilateral  Neurological: She is alert.  Has Contracture in her Right Fingers   Skin: Skin is warm and dry.  Psychiatric: She has a normal mood and affect. Her behavior is normal. Thought content normal.    Labs reviewed: Recent Labs    09/26/17 1551 12/07/17 0804 02/01/18 0710  NA 138 142 139  K 4.0 3.7 3.7  CL 103 106 104  CO2 24 26 27   GLUCOSE 125* 87 96  BUN  18 31* 21*  CREATININE 0.85 0.81 0.89  CALCIUM 9.0 8.5* 8.8*   Recent Labs    08/10/17 0800  AST 19  ALT 11*  ALKPHOS 53  BILITOT 0.4  PROT 7.6  ALBUMIN 3.4*   Recent Labs    09/26/17 1551 12/07/17 0804 02/01/18 0710  WBC 7.7 3.9* 4.5  NEUTROABS 4.3 1.5* 2.0  HGB 11.2* 10.6* 11.0*  HCT 35.3* 34.0* 35.0*  MCV 90.1 91.6 90.9  PLT 264 227 213   Lab Results  Component Value Date   TSH 2.239 12/07/2017   No results found for: HGBA1C Lab Results  Component Value Date   CHOL 115 08/10/2017   HDL 50 08/10/2017   LDLCALC 51 08/10/2017   TRIG 70 08/10/2017   CHOLHDL 2.3 08/10/2017    Significant Diagnostic Results in last 30 days:  No results found.  Assessment/Plan  Essential hypertension BP controlled on clonidine and losartan Creatinine stable   chronic bronchitis  patient is on DuoNeb right now Repeat chest x-ray was negative Patient got dose of antibiotics 2 weeks ago Will start her on Trelegy and see if that helps her symptoms  GERD On Zantac and stable  Age-related osteoporosis without current pathological  fracture She is on Fosamax now TSH and Vit D level was normal She is on Vit D supplement  Hypothyroidism TSH Normal in 04/19 Same dose of Synthroid   Dementia Stable . Doing well with support of her family and staff  Arthritis On Tramadol for pain  Iron deficiency anemia Hgb Stable on iron Supplement Guaiac negative in the past No More work Up due to her Age,   Hyperlipidemia,with CVA history LDL 51 in 12/18 On Statin   Vitamin D deficiency On Supplement     Family/ staff Communication:   Labs/tests ordered:    Total time spent in this patient care encounter was _25 minutes; greater than 50% of the visit spent counseling patient, reviewing records , Labs and coordinating care for problems addressed at this encounter.

## 2018-02-22 DIAGNOSIS — F0390 Unspecified dementia without behavioral disturbance: Secondary | ICD-10-CM | POA: Diagnosis not present

## 2018-02-22 DIAGNOSIS — M6281 Muscle weakness (generalized): Secondary | ICD-10-CM | POA: Diagnosis not present

## 2018-02-22 DIAGNOSIS — R279 Unspecified lack of coordination: Secondary | ICD-10-CM | POA: Diagnosis not present

## 2018-02-22 DIAGNOSIS — M1990 Unspecified osteoarthritis, unspecified site: Secondary | ICD-10-CM | POA: Diagnosis not present

## 2018-02-27 DIAGNOSIS — R262 Difficulty in walking, not elsewhere classified: Secondary | ICD-10-CM | POA: Diagnosis not present

## 2018-02-27 DIAGNOSIS — I739 Peripheral vascular disease, unspecified: Secondary | ICD-10-CM | POA: Diagnosis not present

## 2018-02-27 DIAGNOSIS — L603 Nail dystrophy: Secondary | ICD-10-CM | POA: Diagnosis not present

## 2018-02-28 DIAGNOSIS — F0390 Unspecified dementia without behavioral disturbance: Secondary | ICD-10-CM | POA: Diagnosis not present

## 2018-02-28 DIAGNOSIS — R1312 Dysphagia, oropharyngeal phase: Secondary | ICD-10-CM | POA: Diagnosis not present

## 2018-03-01 DIAGNOSIS — F0390 Unspecified dementia without behavioral disturbance: Secondary | ICD-10-CM | POA: Diagnosis not present

## 2018-03-01 DIAGNOSIS — R1312 Dysphagia, oropharyngeal phase: Secondary | ICD-10-CM | POA: Diagnosis not present

## 2018-03-02 DIAGNOSIS — R1312 Dysphagia, oropharyngeal phase: Secondary | ICD-10-CM | POA: Diagnosis not present

## 2018-03-02 DIAGNOSIS — F0390 Unspecified dementia without behavioral disturbance: Secondary | ICD-10-CM | POA: Diagnosis not present

## 2018-03-07 DIAGNOSIS — F0390 Unspecified dementia without behavioral disturbance: Secondary | ICD-10-CM | POA: Diagnosis not present

## 2018-03-07 DIAGNOSIS — R1312 Dysphagia, oropharyngeal phase: Secondary | ICD-10-CM | POA: Diagnosis not present

## 2018-03-08 DIAGNOSIS — F0390 Unspecified dementia without behavioral disturbance: Secondary | ICD-10-CM | POA: Diagnosis not present

## 2018-03-08 DIAGNOSIS — R1312 Dysphagia, oropharyngeal phase: Secondary | ICD-10-CM | POA: Diagnosis not present

## 2018-03-09 DIAGNOSIS — F0390 Unspecified dementia without behavioral disturbance: Secondary | ICD-10-CM | POA: Diagnosis not present

## 2018-03-09 DIAGNOSIS — R1312 Dysphagia, oropharyngeal phase: Secondary | ICD-10-CM | POA: Diagnosis not present

## 2018-03-13 DIAGNOSIS — F0390 Unspecified dementia without behavioral disturbance: Secondary | ICD-10-CM | POA: Diagnosis not present

## 2018-03-13 DIAGNOSIS — R1312 Dysphagia, oropharyngeal phase: Secondary | ICD-10-CM | POA: Diagnosis not present

## 2018-03-14 DIAGNOSIS — R1312 Dysphagia, oropharyngeal phase: Secondary | ICD-10-CM | POA: Diagnosis not present

## 2018-03-14 DIAGNOSIS — F0390 Unspecified dementia without behavioral disturbance: Secondary | ICD-10-CM | POA: Diagnosis not present

## 2018-03-16 DIAGNOSIS — F0390 Unspecified dementia without behavioral disturbance: Secondary | ICD-10-CM | POA: Diagnosis not present

## 2018-03-16 DIAGNOSIS — R1312 Dysphagia, oropharyngeal phase: Secondary | ICD-10-CM | POA: Diagnosis not present

## 2018-03-17 DIAGNOSIS — F0390 Unspecified dementia without behavioral disturbance: Secondary | ICD-10-CM | POA: Diagnosis not present

## 2018-03-17 DIAGNOSIS — R1312 Dysphagia, oropharyngeal phase: Secondary | ICD-10-CM | POA: Diagnosis not present

## 2018-04-11 ENCOUNTER — Other Ambulatory Visit: Payer: Self-pay

## 2018-04-11 MED ORDER — TRAMADOL HCL 50 MG PO TABS: 100.0000 mg | ORAL_TABLET | Freq: Every day | ORAL | 0 refills | Status: DC

## 2018-04-11 NOTE — Telephone Encounter (Signed)
RX Fax for [facility]@ [phone]  

## 2018-05-08 ENCOUNTER — Other Ambulatory Visit: Payer: Self-pay

## 2018-05-08 MED ORDER — TRAMADOL HCL 50 MG PO TABS: 100.0000 mg | ORAL_TABLET | Freq: Every day | ORAL | 0 refills | Status: DC

## 2018-05-08 NOTE — Telephone Encounter (Signed)
RX Fax for [facility]@ [phone]  

## 2018-05-22 DIAGNOSIS — I739 Peripheral vascular disease, unspecified: Secondary | ICD-10-CM | POA: Diagnosis not present

## 2018-05-22 DIAGNOSIS — B351 Tinea unguium: Secondary | ICD-10-CM | POA: Diagnosis not present

## 2018-05-29 ENCOUNTER — Non-Acute Institutional Stay (SKILLED_NURSING_FACILITY): Payer: Medicare Other

## 2018-05-29 ENCOUNTER — Encounter: Payer: Self-pay | Admitting: Internal Medicine

## 2018-05-29 ENCOUNTER — Encounter (HOSPITAL_COMMUNITY)
Admission: AD | Admit: 2018-05-29 | Discharge: 2018-05-29 | Disposition: A | Discharge status: Home / Self Care | Payer: Medicare Other | Source: Skilled Nursing Facility | Attending: Internal Medicine | Admitting: Internal Medicine

## 2018-05-29 ENCOUNTER — Non-Acute Institutional Stay (SKILLED_NURSING_FACILITY): Payer: Medicare Other | Admitting: Internal Medicine

## 2018-05-29 DIAGNOSIS — G309 Alzheimer's disease, unspecified: Secondary | ICD-10-CM

## 2018-05-29 DIAGNOSIS — J411 Mucopurulent chronic bronchitis: Secondary | ICD-10-CM

## 2018-05-29 DIAGNOSIS — R8279 Other abnormal findings on microbiological examination of urine: Secondary | ICD-10-CM | POA: Insufficient documentation

## 2018-05-29 DIAGNOSIS — E039 Hypothyroidism, unspecified: Secondary | ICD-10-CM | POA: Diagnosis not present

## 2018-05-29 DIAGNOSIS — F0281 Dementia in other diseases classified elsewhere with behavioral disturbance: Secondary | ICD-10-CM

## 2018-05-29 DIAGNOSIS — Z0000 Encounter for general adult medical examination without abnormal findings: Secondary | ICD-10-CM | POA: Diagnosis not present

## 2018-05-29 DIAGNOSIS — I10 Essential (primary) hypertension: Secondary | ICD-10-CM | POA: Diagnosis not present

## 2018-05-29 LAB — URINALYSIS, ROUTINE W REFLEX MICROSCOPIC
BACTERIA UA: NONE SEEN
Bilirubin Urine: NEGATIVE
Glucose, UA: NEGATIVE mg/dL
KETONES UR: NEGATIVE mg/dL
Leukocytes, UA: NEGATIVE
Nitrite: NEGATIVE
PH: 5 (ref 5.0–8.0)
PROTEIN: NEGATIVE mg/dL
Specific Gravity, Urine: 1.021 (ref 1.005–1.030)

## 2018-05-29 NOTE — Progress Notes (Signed)
Subjective:   Jo Hardin is a 84 y.o. female who presents for Medicare Annual (Subsequent) preventive examination at [facility]  Last AWV-05/09/2017    Objective:     Vitals: BP 125/70 (BP Location: Left Arm, Patient Position: Sitting)   Pulse 78   Temp 98.1 F (36.7 C) (Oral)   Ht 5\' 4"  (1.626 m)   Wt 148 lb (67.1 kg)   BMI 25.40 kg/m   Body mass index is 25.4 kg/m.  Advanced Directives 05/29/2018 02/21/2018 02/08/2018 12/22/2017 12/06/2017 11/01/2017 09/26/2017  Does Patient Have a Medical Advance Directive? Yes Yes Yes Yes Yes Yes Yes  Type of Advance Directive Out of facility DNR (pink MOST or yellow form) Out of facility DNR (pink MOST or yellow form) Out of facility DNR (pink MOST or yellow form) Out of facility DNR (pink MOST or yellow form) Out of facility DNR (pink MOST or yellow form) Out of facility DNR (pink MOST or yellow form) Out of facility DNR (pink MOST or yellow form)  Does patient want to make changes to medical advance directive? No - Patient declined No - Patient declined No - Patient declined No - Patient declined No - Patient declined No - Patient declined No - Patient declined  Copy of [place] in Chart? No - copy requested No - copy requested No - copy requested No - copy requested No - copy requested No - copy requested No - copy requested  Would patient like information on creating a medical advance directive? - - - - - - -  Pre-existing out of facility DNR order (yellow form or pink MOST form) Yellow form placed in chart (order not valid for inpatient use);Pink MOST form placed in chart (order not valid for inpatient use) - - - - - -    Tobacco Social History   Tobacco Use  Smoking Status Never Smoker  Smokeless Tobacco Never Used     Counseling given: Not Answered   Clinical Intake:  Pre-visit preparation completed: No  Pain : No/denies pain     Diabetes: No  How often do you need to have someone help you when you  read instructions, pamphlets, or other written materials from your doctor or pharmacy?: 4 - Often What is the last grade level you completed in school?: college  Interpreter Needed?: No  Information entered by :: Tyson Dense, RN  Past Medical History:  Diagnosis Date  . Anemia   . Arthritis   . Bradycardia   . Cancer [facility])    had breast cancer 8 yrs ago  . 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 GERD (gastroesophageal reflux disease)   . HCAP (healthcare-associated pneumonia)   . Hypertension   . Hypokalemia   . Hypomagnesemia   . Osteoarthritis   . Pancreatitis, acute 05/03/2015  . Thyroid disease   . Urinary tract infection 02/07/16   Coag negative staph. Nitrofurantoin prescribed   . UTI (lower urinary tract infection) 04/03/2016   Proteus treated with Septra DS   Past Surgical History:  Procedure Laterality Date  . BREAST SURGERY    . ERCP N/A 01/22/2016   Procedure: ENDOSCOPIC RETROGRADE CHOLANGIOPANCREATOGRAPHY (ERCP) With biliary sphincterotomy and stone extraction ;  Surgeon: Rogene Houston, MD;  Location: [facility] ORS;  Service: Endoscopy;  Laterality: N/A;  . ESOPHAGOGASTRODUODENOSCOPY N/A 05/07/2015   Procedure: ESOPHAGOGASTRODUODENOSCOPY (EGD);  Surgeon: Rogene Houston, MD;  Location: [facility] ENDO SUITE;  Service: Endoscopy;  Laterality: N/A;  Family History  Problem Relation Age of Onset  . Cancer Neg Hx   . Diabetes Neg Hx   . Heart disease Neg Hx   . Stroke Neg Hx    Social History   Socioeconomic History  . Marital status: Widowed    Spouse name: Not on file  . Number of children: Not on file  . Years of education: Not on file  . Highest education level: Not on file  Occupational History  . Not on file  Social Needs  . Financial resource strain: Not hard at all  . Food insecurity:    Worry: Never true    Inability: Never true  . Transportation needs:    Medical: No    Non-medical: No  Tobacco Use  . Smoking status: Never Smoker  .  Smokeless tobacco: Never Used  Substance and Sexual Activity  . Alcohol use: No  . Drug use: No  . Sexual activity: Not on file  Lifestyle  . Physical activity:    Days per week: 0 days    Minutes per session: 0 min  . Stress: Only a little  Relationships  . Social connections:    Talks on phone: Once a week    Gets together: Never    Attends religious service: Never    Active member of club or organization: No    Attends meetings of clubs or organizations: Never    Relationship status: Widowed  Other Topics Concern  . Not on file  Social History Narrative  . Not on file    Outpatient Encounter Medications as of 05/29/2018  Medication Sig  . acetaminophen (TYLENOL) 325 MG tablet Take 650 mg by mouth every 6 (six) hours as needed.  Marland Kitchen alendronate (FOSAMAX) 70 MG tablet Take 70 mg by mouth once a week. Take with a full glass of water on an empty stomach.  Roseanne Kaufman [place]-Castor Oil (VENELEX) OINT Apply to sacrum and bilateral buttock every shift and as needed for prevention  . Cholecalciferol 1000 units TBDP Take 1 tablet by mouth daily  . cloNIDine (CATAPRES) 0.1 MG tablet Take 0.1 mg by mouth 2 (two) times daily.  . ferrous sulfate (FERROUSUL) 325 (65 FE) MG tablet Take 325 mg by mouth daily with breakfast.  . fluticasone ([employer] FLUTICASONE PROPIONATE) 50 MCG/ACT nasal spray Place 1 spray into both nostrils 2 (two) times daily.  Marland Kitchen guaiFENesin (MUCINEX) 600 MG 12 hr tablet Take 600 mg by mouth 2 (two) times daily.  Marland Kitchen ipratropium-albuterol (DUONEB) 0.5-2.5 (3) MG/3ML SOLN Take 3 mLs by nebulization every 6 (six) hours as needed.  Marland Kitchen ipratropium-albuterol (DUONEB) 0.5-2.5 (3) MG/3ML SOLN Take 3 mLs by nebulization every 8 (eight) hours.  Marland Kitchen levothyroxine (SYNTHROID, LEVOTHROID) 175 MCG tablet Take 175 mcg by mouth daily before breakfast.  . losartan (COZAAR) 50 MG tablet Take 50 mg by mouth daily.  . Multiple Vitamins-Minerals (MULTIVITAMIN WITH MINERALS) tablet Take 1 tablet by mouth  daily.  . polyethylene glycol (MIRALAX / GLYCOLAX) packet Take 17 g by mouth every evening.  . ranitidine (ZANTAC) 150 MG tablet Take 150 mg by mouth daily.  . sennosides-docusate sodium (SENOKOT-S) 8.6-50 MG tablet Take 1 tablet by mouth at bedtime.  . simvastatin (ZOCOR) 10 MG tablet Take 10 mg by mouth at bedtime.  . traMADol (ULTRAM) 50 MG tablet Take 2 tablets (100 mg total) by mouth at bedtime.   No facility-administered encounter medications on file as of 05/29/2018.     Activities of Daily Living In  your present state of health, do you have any difficulty performing the following activities: 05/29/2018  Hearing? N  Vision? Y  Difficulty concentrating or making decisions? Y  Walking or climbing stairs? Y  Dressing or bathing? Y  Doing errands, shopping? Y  Preparing Food and eating ? Y  Using the Toilet? Y  In the past six months, have you accidently leaked urine? Y  Do you have problems with loss of bowel control? Y  Managing your Medications? Y  Managing your Finances? Y  Housekeeping or managing your Housekeeping? Y  Some recent data might be hidden    Patient Care Team: [name], MD as PCP - General (Internal Medicine)    Assessment:   This is a routine wellness examination for Naveena.  Exercise Activities and Dietary recommendations Current Exercise Habits: The patient does not participate in regular exercise at present, Exercise limited by: neurologic condition(s);orthopedic condition(s)  Goals   None     Fall Risk Fall Risk  05/29/2018 05/09/2017  Falls in the past year? No No   Is the patient's home free of loose throw rugs in walkways, pet beds, electrical cords, etc?   yes      Grab bars in the bathroom? yes      Handrails on the stairs?   yes      Adequate lighting?   yes  Depression Screen PHQ 2/9 Scores 05/29/2018 05/09/2017  PHQ - 2 Score 0 0     Cognitive Function     6CIT Screen 05/29/2018 05/09/2017  What Year? 4 points 4 points  What  month? 3 points 3 points  What time? 3 points 3 points  Count back from 20 4 points 4 points  Months in reverse 4 points 4 points  Repeat phrase 10 points 10 points  Total Score 28 28    Immunization History  Administered Date(s) Administered  . Influenza,inj,Quad PF,6+ Mos 05/04/2015  . Influenza-Unspecified 05/28/2016, 06/03/2017  . Pneumococcal Conjugate-13 06/29/2016  . Pneumococcal-Unspecified 06/10/2016  . Tdap 05/12/2017    Qualifies for Shingles Vaccine? Not in past records  Screening Tests Health Maintenance  Topic Date Due  . INFLUENZA VACCINE  03/30/2018  . TETANUS/TDAP  05/13/2027  . DEXA SCAN  Completed  . PNA vac Low Risk Adult  Completed    Cancer Screenings: Lung: Low Dose CT Chest recommended if Age 34-80 years, 30 pack-year currently smoking OR have quit w/in 15years. Patient does not qualify. Breast:  Up to date on Mammogram? Yes   Up to date of Bone Density/Dexa? Yes Colorectal: up to date  Additional Screenings:  Hepatitis C Screening: declined Flu vaccine due: will receive at [facility]:    I have personally reviewed and addressed the Medicare Annual Wellness questionnaire and have noted the following in the patient's chart:  A. Medical and social history B. Use of alcohol, tobacco or illicit drugs  C. Current medications and supplements D. Functional ability and status E.  Nutritional status F.  Physical activity G. Advance directives H. List of other physicians I.  Hospitalizations, surgeries, and ER visits in previous 12 months J.  [place] to include hearing, vision, cognitive, depression L. Referrals and appointments - none  In addition, I have reviewed and discussed with patient certain preventive protocols, quality metrics, and best practice recommendations. A written personalized care plan for preventive services as well as general preventive health recommendations were provided to patient.  See attached scanned  questionnaire for  additional information.   Signed,   Tyson Dense, RN Nurse Health Advisor  Patient Concerns: None

## 2018-05-29 NOTE — Patient Instructions (Addendum)
Jo Hardin , Thank you for taking time to come for your Medicare Wellness Visit. I appreciate your ongoing commitment to your health goals. Please review the following plan we discussed and let me know if I can assist you in the future.   Screening recommendations/referrals: Colonoscopy excluded, over age 82 Mammogram excluded, over age 46 Bone Density up to date Recommended yearly ophthalmology/optometry visit for glaucoma screening and checkup Recommended yearly dental visit for hygiene and checkup  Vaccinations: Influenza vaccine due, will receive at [facility] Pneumococcal vaccine up to date, completed Tdap vaccine up to date, due 05/13/2027 Shingles vaccine not in past records    Advanced directives: in chart  Conditions/risks identified: none  Next appointment: Dr. Lyndel Safe makes rounds   Preventive Care 65 Years and Older, Female Preventive care refers to lifestyle choices and visits with your health care provider that can promote health and wellness. What does preventive care include?  A yearly physical exam. This is also called an annual well check.  Dental exams once or twice a year.  Routine eye exams. Ask your health care provider how often you should have your eyes checked.  Personal lifestyle choices, including:  Daily care of your teeth and gums.  Regular physical activity.  Eating a healthy diet.  Avoiding tobacco and drug use.  Limiting alcohol use.  Practicing safe sex.  Taking low-dose aspirin every day.  Taking vitamin and mineral supplements as recommended by your health care provider. What happens during an annual well check? The services and screenings done by your health care provider during your annual well check will depend on your age, overall health, lifestyle risk factors, and family history of disease. Counseling  Your health care provider may ask you questions about your:  Alcohol use.  Tobacco use.  Drug use.  Emotional  well-being.  Home and relationship well-being.  Sexual activity.  Eating habits.  History of falls.  Memory and ability to understand (cognition).  Work and work Statistician.  Reproductive health. Screening  You may have the following tests or measurements:  Height, weight, and BMI.  Blood pressure.  Lipid and cholesterol levels. These may be checked every 5 years, or more frequently if you are over 1 years old.  Skin check.  Lung cancer screening. You may have this screening every year starting at age 69 if you have a 30-pack-year history of smoking and currently smoke or have quit within the past 15 years.  Fecal occult blood test (FOBT) of the stool. You may have this test every year starting at age 40.  Flexible sigmoidoscopy or colonoscopy. You may have a sigmoidoscopy every 5 years or a colonoscopy every 10 years starting at age 7.  Hepatitis C blood test.  Hepatitis B blood test.  Sexually transmitted disease (STD) testing.  Diabetes screening. This is done by checking your blood sugar (glucose) after you have not eaten for a while (fasting). You may have this done every 1-3 years.  Bone density scan. This is done to screen for osteoporosis. You may have this done starting at age 15.  Mammogram. This may be done every 1-2 years. Talk to your health care provider about how often you should have regular mammograms. Talk with your health care provider about your test results, treatment options, and if necessary, the need for more tests. Vaccines  Your health care provider may recommend certain vaccines, such as:  Influenza vaccine. This is recommended every year.  Tetanus, diphtheria, and acellular pertussis (Tdap, Td) vaccine.  You may need a Td booster every 10 years.  Zoster vaccine. You may need this after age 22.  Pneumococcal 13-valent conjugate (PCV13) vaccine. One dose is recommended after age 76.  Pneumococcal polysaccharide (PPSV23) vaccine. One  dose is recommended after age 69. Talk to your health care provider about which screenings and vaccines you need and how often you need them. This information is not intended to replace advice given to you by your health care provider. Make sure you discuss any questions you have with your health care provider. Document Released: 09/12/2015 Document Revised: 05/05/2016 Document Reviewed: 06/17/2015 Elsevier Interactive Patient Education  2017 [place] Prevention in the Home Falls can cause injuries. They can happen to people of all ages. There are many things you can do to make your home safe and to help prevent falls. What can I do on the outside of my home?  Regularly fix the edges of walkways and driveways and fix any cracks.  Remove anything that might make you trip as you walk through a door, such as a raised step or threshold.  Trim any bushes or trees on the path to your home.  Use bright outdoor lighting.  Clear any walking paths of anything that might make someone trip, such as rocks or tools.  Regularly check to see if handrails are loose or broken. Make sure that both sides of any steps have handrails.  Any raised decks and porches should have guardrails on the edges.  Have any leaves, snow, or ice cleared regularly.  Use sand or salt on walking paths during winter.  Clean up any spills in your garage right away. This includes oil or grease spills. What can I do in the bathroom?  Use night lights.  Install grab bars by the toilet and in the tub and shower. Do not use towel bars as grab bars.  Use non-skid mats or decals in the tub or shower.  If you need to sit down in the shower, use a plastic, non-slip stool.  Keep the floor dry. Clean up any water that spills on the floor as soon as it happens.  Remove soap buildup in the tub or shower regularly.  Attach bath mats securely with double-sided non-slip rug tape.  Do not have throw rugs and other  things on the floor that can make you trip. What can I do in the bedroom?  Use night lights.  Make sure that you have a light by your bed that is easy to reach.  Do not use any sheets or blankets that are too big for your bed. They should not hang down onto the floor.  Have a firm chair that has side arms. You can use this for support while you get dressed.  Do not have throw rugs and other things on the floor that can make you trip. What can I do in the kitchen?  Clean up any spills right away.  Avoid walking on wet floors.  Keep items that you use a lot in easy-to-reach places.  If you need to reach something above you, use a strong step stool that has a grab bar.  Keep electrical cords out of the way.  Do not use floor polish or wax that makes floors slippery. If you must use wax, use non-skid floor wax.  Do not have throw rugs and other things on the floor that can make you trip. What can I do with my stairs?  Do not leave any  items on the stairs.  Make sure that there are handrails on both sides of the stairs and use them. Fix handrails that are broken or loose. Make sure that handrails are as long as the stairways.  Check any carpeting to make sure that it is firmly attached to the stairs. Fix any carpet that is loose or worn.  Avoid having throw rugs at the top or bottom of the stairs. If you do have throw rugs, attach them to the floor with carpet tape.  Make sure that you have a light switch at the top of the stairs and the bottom of the stairs. If you do not have them, ask someone to add them for you. What else can I do to help prevent falls?  Wear shoes that:  Do not have high heels.  Have rubber bottoms.  Are comfortable and fit you well.  Are closed at the toe. Do not wear sandals.  If you use a stepladder:  Make sure that it is fully opened. Do not climb a closed stepladder.  Make sure that both sides of the stepladder are locked into place.  Ask  someone to hold it for you, if possible.  Clearly mark and make sure that you can see:  Any grab bars or handrails.  First and last steps.  Where the edge of each step is.  Use tools that help you move around (mobility aids) if they are needed. These include:  Canes.  Walkers.  Scooters.  Crutches.  Turn on the lights when you go into a dark area. Replace any light bulbs as soon as they burn out.  Set up your furniture so you have a clear path. Avoid moving your furniture around.  If any of your floors are uneven, fix them.  If there are any pets around you, be aware of where they are.  Review your medicines with your doctor. Some medicines can make you feel dizzy. This can increase your chance of falling. Ask your doctor what other things that you can do to help prevent falls. This information is not intended to replace advice given to you by your health care provider. Make sure you discuss any questions you have with your health care provider. Document Released: 06/12/2009 Document Revised: 01/22/2016 Document Reviewed: 09/20/2014 Elsevier Interactive Patient Education  2017 [employer].

## 2018-05-29 NOTE — Progress Notes (Signed)
Location:    [facility] Room Number: 157/W Place of Service:  SNF (31) Provider: Veleta Miners MD  [name], MD  Patient Care Team: [name], MD as PCP - General (Internal Medicine)  Extended Emergency Contact Information Primary Emergency Contact: [name] Address: [address]          [place],  [place] Home Phone: [phone] Relation: None Secondary Emergency Contact: [place], [place] [address] [name] of [place] Phone: [phone] Relation: Grandson  Code Status:  DNR Goals of care: Advanced Directive information Advanced Directives 05/29/2018  Does Patient Have a Medical Advance Directive? Yes  Type of Advance Directive Out of facility DNR (pink MOST or yellow form)  Does patient want to make changes to medical advance directive? No - Patient declined  Copy of [place] in Chart? No - copy requested  Would patient like information on creating a medical advance directive? -  Pre-existing out of facility DNR order (yellow form or pink MOST form) -     Chief Complaint  Patient presents with  . Acute Visit    Patient is being seen for Behavior     HPI:  Pt is a 84 y.o. female seen today for an acute visit for some Behavior Issues. Patient has H/O Dementia, hypothyroidism, Hypertension and anemiaand Osteoporosis.. Patient is Long term resident of facility. She is unable to Give any history due to her Dementia Per Family patient has been more aggressive with them and has been using Curse words which is new for her. Usually she is Pleasantly Demented. Nurses have not noticed anything. She is afebrile and does not have any Cough. Patient denies any problems but it is hard to get any history from her. Her appetite is good. Weight is 153 lbs which is 5 lbs better then Before in 06/19  Past Medical History:  Diagnosis Date  . Anemia   . Arthritis   . Bradycardia   . Cancer  [facility])    had breast cancer 8 yrs ago  . 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 GERD (gastroesophageal reflux disease)   . HCAP (healthcare-associated pneumonia)   . Hypertension   . Hypokalemia   . Hypomagnesemia   . Osteoarthritis   . Pancreatitis, acute 05/03/2015  . Thyroid disease   . Urinary tract infection 02/07/16   Coag negative staph. Nitrofurantoin prescribed   . UTI (lower urinary tract infection) 04/03/2016   Proteus treated with Septra DS   Past Surgical History:  Procedure Laterality Date  . BREAST SURGERY    . ERCP N/A 01/22/2016   Procedure: ENDOSCOPIC RETROGRADE CHOLANGIOPANCREATOGRAPHY (ERCP) With biliary sphincterotomy and stone extraction ;  Surgeon: Rogene Houston, MD;  Location: [facility];  Service: Endoscopy;  Laterality: N/A;  . ESOPHAGOGASTRODUODENOSCOPY N/A 05/07/2015   Procedure: ESOPHAGOGASTRODUODENOSCOPY (EGD);  Surgeon: Rogene Houston, MD;  Location: [facility] ENDO SUITE;  Service: Endoscopy;  Laterality: N/A;    Allergies  Allergen Reactions  . Codeine Nausea And Vomiting  . Nsaids     Outpatient Encounter Medications as of 05/29/2018  Medication Sig  . acetaminophen (TYLENOL) 325 MG tablet Take 650 mg by mouth every 6 (six) hours as needed.  Marland Kitchen alendronate (FOSAMAX) 70 MG tablet Take 70 mg by mouth once a week. Take with a full glass of water on an empty stomach.  Roseanne Kaufman [place]-Castor Oil (VENELEX) OINT  Apply to sacrum and bilateral buttock every shift and as needed for prevention  . Cholecalciferol 1000 units TBDP Take 1 tablet by mouth daily  . cloNIDine (CATAPRES) 0.1 MG tablet Take 0.1 mg by mouth 2 (two) times daily.  . ferrous sulfate (FERROUSUL) 325 (65 FE) MG tablet Take 325 mg by mouth daily with breakfast.  . Fluticasone-Umeclidin-Vilant (TRELEGY ELLIPTA) 100-62.5-25 MCG/INH AEPB Take 1 puff inhalation once a day  . ipratropium-albuterol (DUONEB) 0.5-2.5 (3) MG/3ML SOLN Take 3 mLs by nebulization every 6 (six) hours as needed.  Marland Kitchen  levothyroxine (SYNTHROID, LEVOTHROID) 175 MCG tablet Take 175 mcg by mouth daily before breakfast.  . losartan (COZAAR) 50 MG tablet Take 50 mg by mouth daily.  . Multiple Vitamins-Minerals (MULTIVITAMIN WITH MINERALS) tablet Take 1 tablet by mouth daily.  . polyethylene glycol (MIRALAX / GLYCOLAX) packet Take 17 g by mouth every evening.  . ranitidine (ZANTAC) 150 MG tablet Take 150 mg by mouth daily.  . sennosides-docusate sodium (SENOKOT-S) 8.6-50 MG tablet Take 1 tablet by mouth at bedtime.  . simvastatin (ZOCOR) 10 MG tablet Take 10 mg by mouth at bedtime.  . traMADol (ULTRAM) 50 MG tablet Take 2 tablets (100 mg total) by mouth at bedtime.  . [DISCONTINUED] fluticasone ([employer] FLUTICASONE PROPIONATE) 50 MCG/ACT nasal spray Place 1 spray into both nostrils 2 (two) times daily.  . [DISCONTINUED] guaiFENesin (MUCINEX) 600 MG 12 hr tablet Take 600 mg by mouth 2 (two) times daily.  . [DISCONTINUED] ipratropium-albuterol (DUONEB) 0.5-2.5 (3) MG/3ML SOLN Take 3 mLs by nebulization every 8 (eight) hours.   No facility-administered encounter medications on file as of 05/29/2018.      Review of Systems  Unable to perform ROS: Dementia    Immunization History  Administered Date(s) Administered  . Influenza,inj,Quad PF,6+ Mos 05/04/2015  . Influenza-Unspecified 05/28/2016, 06/03/2017  . Pneumococcal Conjugate-13 06/29/2016  . Pneumococcal-Unspecified 06/10/2016  . Tdap 05/12/2017   Pertinent  Health Maintenance Due  Topic Date Due  . INFLUENZA VACCINE  06/28/2018 (Originally 03/30/2018)  . DEXA SCAN  Completed  . PNA vac Low Risk Adult  Completed   Fall Risk  05/29/2018 05/09/2017  Falls in the past year? No No   Functional Status Survey:    There were no vitals filed for this visit. There is no height or weight on file to calculate BMI. Physical Exam  Constitutional: She appears well-developed and well-nourished.  HENT:  Head: Normocephalic.  Mouth/Throat: Oropharynx is clear and  moist.  Has no teeth  Neck: Neck supple.  Cardiovascular: Normal rate and regular rhythm.  Murmur heard. Pulmonary/Chest: Effort normal. No respiratory distress. She has no wheezes. She has no rales.  Abdominal: Soft. Bowel sounds are normal. She exhibits no distension. There is no tenderness. There is no rebound.  Neurological: She is alert.  Just says yes for everything. Does follow Simple Commands Mostly Wheel Chair Bound. Has tremors in Both hands. Also has Stiffness in Right Side   Psychiatric: She has a normal mood and affect. Her behavior is normal. Thought content normal.    Labs reviewed: Recent Labs    09/26/17 1551 12/07/17 0804 02/01/18 0710  NA 138 142 139  K 4.0 3.7 3.7  CL 103 106 104  CO2 24 26 27   GLUCOSE 125* 87 96  BUN 18 31* 21*  CREATININE 0.85 0.81 0.89  CALCIUM 9.0 8.5* 8.8*   Recent Labs    08/10/17 0800  AST 19  ALT 11*  ALKPHOS 53  BILITOT 0.4  PROT 7.6  ALBUMIN 3.4*   Recent Labs    09/26/17 1551 12/07/17 0804 02/01/18 0710  WBC 7.7 3.9* 4.5  NEUTROABS 4.3 1.5* 2.0  HGB 11.2* 10.6* 11.0*  HCT 35.3* 34.0* 35.0*  MCV 90.1 91.6 90.9  PLT 264 227 213   Lab Results  Component Value Date   TSH 2.239 12/07/2017   No results found for: HGBA1C Lab Results  Component Value Date   CHOL 115 08/10/2017   HDL 50 08/10/2017   LDLCALC 51 08/10/2017   TRIG 70 08/10/2017   CHOLHDL 2.3 08/10/2017    Significant Diagnostic Results in last 30 days:  No results found.  Assessment/Plan  Dementia with Recent Behavior Issues Will Get CBC, BMP and UA to Rule out Infectious process. Clinically patient seems Stable Essential hypertension BP Elevated today Patient does not seem in any distress Will Check BP QD Increase her Clonidine to 0.2 mg in am Continue Losartan COPD Has been Doing well on Trilegy  GERD On Zantac and stable  Age-related osteoporosis without current pathological fracture She is on Fosamax now TSH and Vit D level  was normal She is on Vit D supplement  Hypothyroidism TSH Normal in 04/19 Same dose of Synthroid Repeat TSH  Arthritis On Tramadol for pain    Family/ staff Communication:   Labs/tests ordered:

## 2018-05-31 ENCOUNTER — Inpatient Hospital Stay (HOSPITAL_COMMUNITY): Admission: RE | Admit: 2018-05-31 | Payer: Self-pay | Source: Skilled Nursing Facility

## 2018-05-31 ENCOUNTER — Other Ambulatory Visit (HOSPITAL_COMMUNITY)
Admission: RE | Admit: 2018-05-31 | Discharge: 2018-05-31 | Disposition: A | Discharge status: Home / Self Care | Payer: Medicare Other | Source: Skilled Nursing Facility | Attending: Internal Medicine | Admitting: Internal Medicine

## 2018-05-31 DIAGNOSIS — M1990 Unspecified osteoarthritis, unspecified site: Secondary | ICD-10-CM | POA: Diagnosis not present

## 2018-05-31 DIAGNOSIS — R1312 Dysphagia, oropharyngeal phase: Secondary | ICD-10-CM | POA: Insufficient documentation

## 2018-05-31 DIAGNOSIS — I10 Essential (primary) hypertension: Secondary | ICD-10-CM | POA: Diagnosis not present

## 2018-05-31 LAB — CBC
HCT: 38.2 % (ref 36.0–46.0)
Hemoglobin: 12.2 g/dL (ref 12.0–15.0)
MCH: 29.8 pg (ref 26.0–34.0)
MCHC: 31.9 g/dL (ref 30.0–36.0)
MCV: 93.2 fL (ref 78.0–100.0)
PLATELETS: 244 10*3/uL (ref 150–400)
RBC: 4.1 MIL/uL (ref 3.87–5.11)
RDW: 12.9 % (ref 11.5–15.5)
WBC: 5 10*3/uL (ref 4.0–10.5)

## 2018-05-31 LAB — BASIC METABOLIC PANEL
Anion gap: 11 (ref 5–15)
BUN: 23 mg/dL (ref 8–23)
CALCIUM: 9.2 mg/dL (ref 8.9–10.3)
CHLORIDE: 106 mmol/L (ref 98–111)
CO2: 26 mmol/L (ref 22–32)
Creatinine, Ser: 0.78 mg/dL (ref 0.44–1.00)
GFR calc Af Amer: >60 mL/min (ref >60)
GFR calc non Af Amer: >60 mL/min (ref >60)
Glucose, Bld: 90 mg/dL (ref 70–99)
Potassium: 4.4 mmol/L (ref 3.5–5.1)
SODIUM: 143 mmol/L (ref 135–145)

## 2018-05-31 LAB — TSH: TSH: 1.076 u[IU]/mL (ref 0.350–4.500)

## 2018-05-31 LAB — URINE CULTURE: Culture: NO GROWTH

## 2018-06-08 ENCOUNTER — Other Ambulatory Visit: Payer: Self-pay

## 2018-06-08 MED ORDER — TRAMADOL HCL 50 MG PO TABS: 100.0000 mg | ORAL_TABLET | Freq: Every day | ORAL | 0 refills | Status: DC

## 2018-06-08 NOTE — Telephone Encounter (Signed)
RX Fax for [facility]@ [phone]  

## 2018-06-26 DIAGNOSIS — H40023 Open angle with borderline findings, high risk, bilateral: Secondary | ICD-10-CM | POA: Diagnosis not present

## 2018-06-26 DIAGNOSIS — H2513 Age-related nuclear cataract, bilateral: Secondary | ICD-10-CM | POA: Diagnosis not present

## 2018-06-26 DIAGNOSIS — H02139 Senile ectropion of unspecified eye, unspecified eyelid: Secondary | ICD-10-CM | POA: Diagnosis not present

## 2018-06-26 DIAGNOSIS — H04123 Dry eye syndrome of bilateral lacrimal glands: Secondary | ICD-10-CM | POA: Diagnosis not present

## 2018-07-04 ENCOUNTER — Other Ambulatory Visit: Payer: Self-pay

## 2018-07-04 MED ORDER — TRAMADOL HCL 50 MG PO TABS: 100.0000 mg | ORAL_TABLET | Freq: Every day | ORAL | 0 refills | Status: DC

## 2018-07-04 NOTE — Telephone Encounter (Signed)
RX Fax for [facility]@ [phone]  

## 2018-07-05 ENCOUNTER — Non-Acute Institutional Stay (SKILLED_NURSING_FACILITY): Payer: Medicare Other | Admitting: Internal Medicine

## 2018-07-05 ENCOUNTER — Encounter: Payer: Self-pay | Admitting: Internal Medicine

## 2018-07-05 DIAGNOSIS — G309 Alzheimer's disease, unspecified: Secondary | ICD-10-CM | POA: Diagnosis not present

## 2018-07-05 DIAGNOSIS — D509 Iron deficiency anemia, unspecified: Secondary | ICD-10-CM | POA: Diagnosis not present

## 2018-07-05 DIAGNOSIS — F0281 Dementia in other diseases classified elsewhere with behavioral disturbance: Secondary | ICD-10-CM

## 2018-07-05 DIAGNOSIS — E039 Hypothyroidism, unspecified: Secondary | ICD-10-CM

## 2018-07-05 DIAGNOSIS — J411 Mucopurulent chronic bronchitis: Secondary | ICD-10-CM

## 2018-07-05 DIAGNOSIS — I10 Essential (primary) hypertension: Secondary | ICD-10-CM | POA: Diagnosis not present

## 2018-07-05 NOTE — Progress Notes (Signed)
Location:    [place] Room Number: 157/W Place of Service:  SNF (31) Provider:  Granville Lewis PA-C  [name], MD  Patient Care Team: [name], MD as PCP - General (Internal Medicine)  Extended Emergency Contact Information Primary Emergency Contact: [name] Address: [address]          [place],  [place] Home Phone: [phone] Relation: None Secondary Emergency Contact: [place], [place] [address] [name] of [place] Phone: [phone] Relation: Grandson  Code Status:  DNR Goals of care: Advanced Directive information Advanced Directives 07/05/2018  Does Patient Have a Medical Advance Directive? Yes  Type of Advance Directive Out of facility DNR (pink MOST or yellow form)  Does patient want to make changes to medical advance directive? No - Patient declined  Copy of [place] in Chart? -  Would patient like information on creating a medical advance directive? -  Pre-existing out of facility DNR order (yellow form or pink MOST form) -     Chief Complaint  Patient presents with  . Medical Management of Chronic Issues    Routine visit of medical management  Medical management of chronic medical issues including dementia- hypertension-hypothyroidism-anemia-osteoarthritis as well as COPD-GERD-osteoporosis-constipation  HPI:  Pt is a 84 y.o. female seen today for medical management of chronic diseases.  As noted above.  She appears to be having a period of stability-she was seen proximally a month ago for apparently some increased behaviors where she was becoming agitated with family members and using curse words- apparently this has improved lab work at that time was unremarkable including a TSH- urine culture did not really grow out anything significant- I have not really heard any issues regarding her behaviors recently.  In regards to dementia she appears to eat well her weight  stable at around 150 pounds.  Regards to hypertension clonidine was recently increased to 0.2 mg every morning and 0.1 mg later in the day-she is also on losartan 50 mg a day I got a manual reading of 146/76 this evening I see previous readings 116/75-129/73-I do not see consistent elevations in considering her advanced age would be somewhat conservative making adjustments as long as she does not have consistent elevations.  In regards to anemia this is been stable with a hemoglobin 12.2 on lab done last month she is on iron.  She also has a history of osteoarthritis is on tramadol routinely at night and Tylenol PRN and this appears to be stable.  Time she will develop a URI with a history of COPD usually responds to a course of antibiotics prednisone and Mucinex she continues on Trelegy routinely  This is been stable it appears since the summer.  Currently she is resting in bed comfortably- she has no complaints but she is a poor historian secondary to dementia.     Past Medical History:  Diagnosis Date  . Anemia   . Arthritis   . Bradycardia   . Cancer [facility])    had breast cancer 8 yrs ago  . 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 GERD (gastroesophageal reflux disease)   . HCAP (healthcare-associated pneumonia)   . Hypertension   . Hypokalemia   . Hypomagnesemia   . Osteoarthritis   . Pancreatitis, acute 05/03/2015  . Thyroid disease   . Urinary tract infection 02/07/16   Coag negative staph. Nitrofurantoin prescribed   .  UTI (lower urinary tract infection) 04/03/2016   Proteus treated with Septra DS   Past Surgical History:  Procedure Laterality Date  . BREAST SURGERY    . ERCP N/A 01/22/2016   Procedure: ENDOSCOPIC RETROGRADE CHOLANGIOPANCREATOGRAPHY (ERCP) With biliary sphincterotomy and stone extraction ;  Surgeon: Rogene Houston, MD;  Location: [facility] ORS;  Service: Endoscopy;  Laterality: N/A;  . ESOPHAGOGASTRODUODENOSCOPY N/A 05/07/2015   Procedure:  ESOPHAGOGASTRODUODENOSCOPY (EGD);  Surgeon: Rogene Houston, MD;  Location: [facility] ENDO [facility];  Service: Endoscopy;  Laterality: N/A;    Allergies  Allergen Reactions  . Codeine Nausea And Vomiting  . Nsaids     Outpatient Encounter Medications as of 07/05/2018  Medication Sig  . acetaminophen (TYLENOL) 325 MG tablet Take 650 mg by mouth every 6 (six) hours as needed.  Marland Kitchen alendronate (FOSAMAX) 70 MG tablet Take 70 mg by mouth once a week. Take with a full glass of water on an empty stomach.  Roseanne Kaufman [place]-Castor Oil (VENELEX) OINT Apply to sacrum and bilateral buttock every shift and as needed for prevention  . Cholecalciferol 1000 units TBDP Take 1 tablet by mouth daily  . cloNIDine (CATAPRES) 0.1 MG tablet Take 0.1 mg by mouth 2 (two) times daily.  . ferrous sulfate (FERROUSUL) 325 (65 FE) MG tablet Take 325 mg by mouth daily with breakfast.  . Fluticasone-Umeclidin-Vilant (TRELEGY ELLIPTA) 100-62.5-25 MCG/INH AEPB Take 1 puff inhalation once a day  . ipratropium-albuterol (DUONEB) 0.5-2.5 (3) MG/3ML SOLN Take 3 mLs by nebulization every 6 (six) hours as needed.  Marland Kitchen levothyroxine (SYNTHROID, LEVOTHROID) 175 MCG tablet Take 175 mcg by mouth daily before breakfast.  . losartan (COZAAR) 50 MG tablet Take 50 mg by mouth daily.  . Multiple Vitamins-Minerals (MULTIVITAMIN WITH MINERALS) tablet Take 1 tablet by mouth daily.  . polyethylene glycol (MIRALAX / GLYCOLAX) packet Take 17 g by mouth every evening.  . ranitidine (ZANTAC) 150 MG tablet Take 150 mg by mouth daily.  . sennosides-docusate sodium (SENOKOT-S) 8.6-50 MG tablet Take 1 tablet by mouth at bedtime.  . simvastatin (ZOCOR) 10 MG tablet Take 10 mg by mouth at bedtime.  . traMADol (ULTRAM) 50 MG tablet Take 2 tablets (100 mg total) by mouth at bedtime.   No facility-administered encounter medications on file as of 07/05/2018.      Review of Systems   Essentially unobtainable secondary to dementia please see HPI  Immunization History   Administered Date(s) Administered  . Influenza,inj,Quad PF,6+ Mos 05/04/2015  . Influenza-Unspecified 05/28/2016, 06/03/2017  . Pneumococcal Conjugate-13 06/29/2016  . Pneumococcal-Unspecified 06/10/2016  . Tdap 05/12/2017   Pertinent  Health Maintenance Due  Topic Date Due  . INFLUENZA VACCINE  Completed  . DEXA SCAN  Completed  . PNA vac Low Risk Adult  Completed   Fall Risk  05/29/2018 05/09/2017  Falls in the past year? No No   Functional Status Survey:    Vitals:   07/05/18 1528  BP: 116/75  Pulse: 66  Resp: 18  Temp: (!) 96.8 F (36 C)  TempSrc: Oral  SpO2: 97%  Weight: 150 lb 9.6 oz (68.3 kg)  Height: 5\' 4"  (1.626 m)  Body mass index is 25.85 kg/m.  Manual blood pressure later today was 146/76 Physical Exam   In general this is a pleasant elderly female resting comfortably in bed she is bright and alert today and talking some.  Her skin is warm and dry.  Eyes sclera and conjunctive appear to be clear she appears to have ectropion present  on her left lower eyelid visual acuity appears to be intact.  Oropharynx is clear mucous membranes moist she is edentulous.  Chest is clear to auscultation with somewhat poor respiratory effort  Heart is regular rate and rhythm with a slight systolic murmur heart sounds are somewhat distant she has chronic venous stasis edema.  Abdomen is soft nontender with positive bowel sounds.  Musculoskeletal does have lower extremity weakness at baseline- does have contracture of her right fourth and fifth fingers as well-.  Neurologic- she is alert-she is talking some today- I could not really appreciate upper extremity tremors at this time but she does have these intermittently.  Could not really appreciate lateralizing findings but limited exam since she is in bed.  Psych she is pleasant does follow simple verbal commands does not appear agitated.    Labs reviewed: Recent Labs    12/07/17 0804 02/01/18 0710 05/31/18 0901   NA 142 139 143  K 3.7 3.7 4.4  CL 106 104 106  CO2 26 27 26   GLUCOSE 87 96 90  BUN 31* 21* 23  CREATININE 0.81 0.89 0.78  CALCIUM 8.5* 8.8* 9.2   Recent Labs    08/10/17 0800  AST 19  ALT 11*  ALKPHOS 53  BILITOT 0.4  PROT 7.6  ALBUMIN 3.4*   Recent Labs    09/26/17 1551 12/07/17 0804 02/01/18 0710 05/31/18 0901  WBC 7.7 3.9* 4.5 5.0  NEUTROABS 4.3 1.5* 2.0  --   HGB 11.2* 10.6* 11.0* 12.2  HCT 35.3* 34.0* 35.0* 38.2  MCV 90.1 91.6 90.9 93.2  PLT 264 227 213 244   Lab Results  Component Value Date   TSH 1.076 05/31/2018   No results found for: HGBA1C Lab Results  Component Value Date   CHOL 115 08/10/2017   HDL 50 08/10/2017   LDLCALC 51 08/10/2017   TRIG 70 08/10/2017   CHOLHDL 2.3 08/10/2017    Significant Diagnostic Results in last 30 days:  No results found.  Assessment/Plan  #1 history of dementia with behaviors-this appears stable her weight is relatively stable at 150 pounds she does have strong family support fact she had some food on her bedside table- I do not note any more complaints of behaviors- again blood work and urine culture when this occurred in late September was unremarkable.  Continue supportive care.  2.  Hypertension continues on clonidine 0.2 mg in the a.m. and 0.1 mg later in the day as well as losartan 50 mg a day-as noted above did not really note consistent elevations will monitor for now somewhat conservative here with her advanced age and comorbidities.  3.  Anemia she continues on iron hemoglobin has shown stability at 12.2 on lab done last month will monitor periodically.  4.  Hypothyroidism continues on Synthroid TSH was 1.076 on lab done last month.  5.  History of osteoarthritis continues on tramadol routinely in the evening and Tylenol as needed throughout the day this appears to be stable.  6.  History of COPD with occasional URI-this is been stable now for several months she is on Trelegy routinely as well as PRN  nebulizers- has responded well to antibiotic prednisone Mucinex in the past when she does have a flare.  7.  History of GERD this is been stable on Zantac.  8.  History of osteoporosis continues on Fosamax vitamin D and TSH have been within normal range.  9.  History of constipation continues on MiraLAX and senna to my knowledge  this is been stable now for some time.  10.  Hyperlipidemia continues on Zocor LDL was 51 on lab done approximately a year ago again with her advanced age somewhat conservative with her lipid control and lipid panel.  [id number]

## 2018-08-01 ENCOUNTER — Other Ambulatory Visit: Payer: Self-pay

## 2018-08-01 MED ORDER — TRAMADOL HCL 50 MG PO TABS: 100.0000 mg | ORAL_TABLET | Freq: Every day | ORAL | 0 refills | Status: DC

## 2018-08-01 NOTE — Telephone Encounter (Signed)
RX Fax for [facility]@ [phone]  

## 2018-08-07 ENCOUNTER — Non-Acute Institutional Stay (SKILLED_NURSING_FACILITY): Payer: Medicare Other | Admitting: Internal Medicine

## 2018-08-07 ENCOUNTER — Encounter: Payer: Self-pay | Admitting: Internal Medicine

## 2018-08-07 DIAGNOSIS — F0281 Dementia in other diseases classified elsewhere with behavioral disturbance: Secondary | ICD-10-CM | POA: Diagnosis not present

## 2018-08-07 DIAGNOSIS — I10 Essential (primary) hypertension: Secondary | ICD-10-CM | POA: Diagnosis not present

## 2018-08-07 DIAGNOSIS — E039 Hypothyroidism, unspecified: Secondary | ICD-10-CM

## 2018-08-07 DIAGNOSIS — K219 Gastro-esophageal reflux disease without esophagitis: Secondary | ICD-10-CM | POA: Diagnosis not present

## 2018-08-07 DIAGNOSIS — G309 Alzheimer's disease, unspecified: Secondary | ICD-10-CM

## 2018-08-07 NOTE — Progress Notes (Signed)
Location:    [place] Room Number: 157/W Place of Service:  SNF (31) Provider:  Veleta Miners MD  [name], MD  Patient Care Team: [name], MD as PCP - General (Internal Medicine)  Extended Emergency Contact Information Primary Emergency Contact: [name] Address: [address]          [place],  [place] Home Phone: [phone] Relation: None Secondary Emergency Contact: [place], [place] [address] [name] of [place] Phone: [phone] Relation: Grandson  Code Status:  DNR Goals of care: Advanced Directive information Advanced Directives 08/07/2018  Does Patient Have a Medical Advance Directive? Yes  Type of Advance Directive Out of facility DNR (pink MOST or yellow form)  Does patient want to make changes to medical advance directive? No - Patient declined  Copy of [place] in Chart? No - copy requested  Would patient like information on creating a medical advance directive? -  Pre-existing out of facility DNR order (yellow form or pink MOST form) -     Chief Complaint  Patient presents with  . Medical Management of Chronic Issues    Routine visit of medical management    HPI:  Pt is a 84 y.o. female seen today for medical management of chronic diseases.    Patient has H/O Dementia, hypothyroidism, Hypertension and anemiaand Osteoporosis.. Patient is Long term resident of facility. She is unable to Give any history due to her Dementia She has been stable in the facility. No New Nursing issues. Her weight is stable at 150-153 Lbs.  Past Medical History:  Diagnosis Date  . Anemia   . Arthritis   . Bradycardia   . Cancer [facility])    had breast cancer 8 yrs ago  . 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 GERD (gastroesophageal reflux disease)   . HCAP (healthcare-associated pneumonia)   . Hypertension   . Hypokalemia   . Hypomagnesemia   .  Osteoarthritis   . Pancreatitis, acute 05/03/2015  . Thyroid disease   . Urinary tract infection 02/07/16   Coag negative staph. Nitrofurantoin prescribed   . UTI (lower urinary tract infection) 04/03/2016   Proteus treated with Septra DS   Past Surgical History:  Procedure Laterality Date  . BREAST SURGERY    . ERCP N/A 01/22/2016   Procedure: ENDOSCOPIC RETROGRADE CHOLANGIOPANCREATOGRAPHY (ERCP) With biliary sphincterotomy and stone extraction ;  Surgeon: Rogene Houston, MD;  Location: [facility] ORS;  Service: Endoscopy;  Laterality: N/A;  . ESOPHAGOGASTRODUODENOSCOPY N/A 05/07/2015   Procedure: ESOPHAGOGASTRODUODENOSCOPY (EGD);  Surgeon: Rogene Houston, MD;  Location: [facility] ENDO SUITE;  Service: Endoscopy;  Laterality: N/A;    Allergies  Allergen Reactions  . Codeine Nausea And Vomiting  . Nsaids     Outpatient Encounter Medications as of 08/07/2018  Medication Sig  . acetaminophen (TYLENOL) 325 MG tablet Take 650 mg by mouth every 6 (six) hours as needed.  Marland Kitchen alendronate (FOSAMAX) 70 MG tablet Take 70 mg by mouth once a week. Take with a full glass of water on an empty stomach.  Roseanne Kaufman [place]-Castor Oil (VENELEX) OINT Apply to sacrum and bilateral buttock every shift and as needed for prevention  . Cholecalciferol 1000 units TBDP Take 1 tablet by mouth daily  . cloNIDine (CATAPRES) 0.1 MG tablet Take 0.1 mg by mouth 2 (two) times daily.  . ferrous sulfate (FERROUSUL) 325 (65 FE)  MG tablet Take 325 mg by mouth daily with breakfast.  . ipratropium-albuterol (DUONEB) 0.5-2.5 (3) MG/3ML SOLN Take 3 mLs by nebulization every 6 (six) hours as needed.  Marland Kitchen levothyroxine (SYNTHROID, LEVOTHROID) 175 MCG tablet Take 175 mcg by mouth daily before breakfast.  . losartan (COZAAR) 50 MG tablet Take 50 mg by mouth daily.  . Multiple Vitamins-Minerals (MULTIVITAMIN WITH MINERALS) tablet Take 1 tablet by mouth daily.  . polyethylene glycol (MIRALAX / GLYCOLAX) packet Take 17 g by mouth every evening.  .  ranitidine (ZANTAC) 150 MG tablet Take 150 mg by mouth daily.  . sennosides-docusate sodium (SENOKOT-S) 8.6-50 MG tablet Take 1 tablet by mouth at bedtime.  . simvastatin (ZOCOR) 10 MG tablet Take 10 mg by mouth at bedtime.  . traMADol (ULTRAM) 50 MG tablet Take 2 tablets (100 mg total) by mouth at bedtime.  . [DISCONTINUED] Fluticasone-Umeclidin-Vilant (TRELEGY ELLIPTA) 100-62.5-25 MCG/INH AEPB Take 1 puff inhalation once a day   No facility-administered encounter medications on file as of 08/07/2018.      Review of Systems  Unable to perform ROS: Dementia    Immunization History  Administered Date(s) Administered  . Influenza,inj,Quad PF,6+ Mos 05/04/2015  . Influenza-Unspecified 05/28/2016, 06/03/2017  . Pneumococcal Conjugate-13 06/29/2016  . Pneumococcal-Unspecified 06/10/2016  . Tdap 05/12/2017   Pertinent  Health Maintenance Due  Topic Date Due  . INFLUENZA VACCINE  Completed  . DEXA SCAN  Completed  . PNA vac Low Risk Adult  Completed   Fall Risk  05/29/2018 05/09/2017  Falls in the past year? No No   Functional Status Survey:    Vitals:   08/07/18 1212  BP: (!) 180/96  Pulse: 75  Resp: 18  Temp: (!) 96.8 F (36 C)  TempSrc: Oral  SpO2: 97%  Weight: 150 lb (68 kg)  Height: 5\' 4"  (1.626 m)   Body mass index is 25.75 kg/m. Physical Exam  Constitutional: She appears well-developed and well-nourished.  HENT:  Head: Normocephalic.  Mouth/Throat: Oropharynx is clear and moist.  Has no teeth  Eyes:  Redness and Discharge in Left Eye  Neck: Neck supple.  Cardiovascular: Normal rate and regular rhythm.  Murmur heard. Pulmonary/Chest: Effort normal. No respiratory distress. She has no wheezes. She has no rales.  Abdominal: Soft. Bowel sounds are normal. She exhibits no distension. There is no tenderness. There is no rebound.  Neurological: She is alert.  Just says yes for everything. Does follow Simple Commands Mostly Wheel Chair Bound. Has tremors in Both  hands. Also has Stiffness in Right Side   Psychiatric: She has a normal mood and affect. Her behavior is normal. Thought content normal.    Labs reviewed: Recent Labs    12/07/17 0804 02/01/18 0710 05/31/18 0901  NA 142 139 143  K 3.7 3.7 4.4  CL 106 104 106  CO2 26 27 26   GLUCOSE 87 96 90  BUN 31* 21* 23  CREATININE 0.81 0.89 0.78  CALCIUM 8.5* 8.8* 9.2   Recent Labs    08/10/17 0800  AST 19  ALT 11*  ALKPHOS 53  BILITOT 0.4  PROT 7.6  ALBUMIN 3.4*   Recent Labs    09/26/17 1551 12/07/17 0804 02/01/18 0710 05/31/18 0901  WBC 7.7 3.9* 4.5 5.0  NEUTROABS 4.3 1.5* 2.0  --   HGB 11.2* 10.6* 11.0* 12.2  HCT 35.3* 34.0* 35.0* 38.2  MCV 90.1 91.6 90.9 93.2  PLT 264 227 213 244   Lab Results  Component Value Date   TSH  1.076 05/31/2018   No results found for: HGBA1C Lab Results  Component Value Date   CHOL 115 08/10/2017   HDL 50 08/10/2017   LDLCALC 51 08/10/2017   TRIG 70 08/10/2017   CHOLHDL 2.3 08/10/2017    Significant Diagnostic Results in last 30 days:  No results found.  Assessment/Plan Essential hypertension BP elevated again today Will increase her Clonidine to 0.2 mg BID Continue to monitor Left Eye Conjunctivitis Start on Ocuflox Eye Drops  GERD On Zantac and stable  Age-related osteoporosis without current pathological fracture She is on Fosamax now TSH and Vit D level was normal She is on Vit D supplement  Hypothyroidism TSH Normal in 10/19 Same dose of Synthroid  Dementia Stable. Doing well with support of her family and staff  Arthritis On Tramadol for pain  Iron deficiency anemia Hgb Normal on iron Supplement Guaiac negative in the past No More work Up due to her Age,   Hyperlipidemia,with CVA history LDL 51in 12/18 On Statin   Vitamin D deficiency On Supplement    Family/ staff Communication:   Labs/tests ordered:

## 2018-08-21 ENCOUNTER — Non-Acute Institutional Stay (SKILLED_NURSING_FACILITY): Payer: Medicare Other | Admitting: Internal Medicine

## 2018-08-21 ENCOUNTER — Encounter: Payer: Self-pay | Admitting: Internal Medicine

## 2018-08-21 DIAGNOSIS — I10 Essential (primary) hypertension: Secondary | ICD-10-CM

## 2018-08-21 DIAGNOSIS — J309 Allergic rhinitis, unspecified: Secondary | ICD-10-CM | POA: Diagnosis not present

## 2018-08-21 NOTE — Progress Notes (Signed)
Location:    [place] Room Number: 157/W Place of Service:  SNF [phone]) Provider:  [name], MD  Patient Care Team: [name], MD as PCP - General (Internal Medicine)  Extended Emergency Contact Information Primary Emergency Contact: [name] Address: [address]          [place],  [place] Home Phone: [phone] Relation: None Secondary Emergency Contact: [place], [place] [address] [name] of [place] Phone: [phone] Relation: Grandson  Code Status:  DNR Goals of care: Advanced Directive information Advanced Directives 08/21/2018  Does Patient Have a Medical Advance Directive? Yes  Type of Advance Directive Out of facility DNR (pink MOST or yellow form)  Does patient want to make changes to medical advance directive? No - Patient declined  Copy of [place] in Chart? No - copy requested  Would patient like information on creating a medical advance directive? -  Pre-existing out of facility DNR order (yellow form or pink MOST form) -     Chief complaint-acute visit secondary to possible allergy symptoms  HPI:  Pt is a 84 y.o. female seen today for an acute visit for possible allergy symptoms.  Her family has seen her over the weekend and feel that she is probably having some allergy symptoms and would like possible medication for this.   Jo Hardin is a long-time resident of facility and has a history of dementia as well as hypertension hypothyroidism anemia osteoporosis osteoarthritis hyperlipidemia and history of CVA.  At times she will develop a URI and at times has required an antibiotic prednisone nebulizers and Mucinex.  I do not see any reports of coughing or shortness of breath or congestion but apparently she does have a runny nose and allergy symptoms.  Vital signs appear to be stable.  Her clonidine actually was increased recently secondary to concerns  for elevated blood pressure this appears to be helping listed blood pressure is 138/76 today I checked it manually and got 122/80 yesterday it appears blood pressure was 140/72.  She is also on Cozaar 50 mg a day in addition to the clonidine.  Currently she is sitting in her wheelchair comfortably she is a poor historian secondary to dementia but nursing staff has not noted any signs of discomfort.     Past Medical History:  Diagnosis Date  . Anemia   . Arthritis   . Bradycardia   . Cancer [facility])    had breast cancer 8 yrs ago  . 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 GERD (gastroesophageal reflux disease)   . HCAP (healthcare-associated pneumonia)   . Hypertension   . Hypokalemia   . Hypomagnesemia   . Osteoarthritis   . Pancreatitis, acute 05/03/2015  . Thyroid disease   . Urinary tract infection 02/07/16   Coag negative staph. Nitrofurantoin prescribed   . UTI (lower urinary tract infection) 04/03/2016   Proteus treated with Septra DS   Past Surgical History:  Procedure Laterality Date  . BREAST SURGERY    . ERCP N/A 01/22/2016   Procedure: ENDOSCOPIC RETROGRADE CHOLANGIOPANCREATOGRAPHY (ERCP) With biliary sphincterotomy and stone extraction ;  Surgeon: Rogene Houston, MD;  Location: [facility] ORS;  Service: Endoscopy;  Laterality: N/A;  . ESOPHAGOGASTRODUODENOSCOPY N/A 05/07/2015   Procedure: ESOPHAGOGASTRODUODENOSCOPY (EGD);  Surgeon: Rogene Houston, MD;  Location: [facility] ENDO [facility];  Service: Endoscopy;  Laterality: N/A;  Allergies  Allergen Reactions  . Codeine Nausea And Vomiting  . Nsaids     Outpatient Encounter Medications as of 08/21/2018  Medication Sig  . acetaminophen (TYLENOL) 325 MG tablet Take 650 mg by mouth every 6 (six) hours as needed.  Marland Kitchen alendronate (FOSAMAX) 70 MG tablet Take 70 mg by mouth once a week. Take with a full glass of water on an empty stomach.  Roseanne Kaufman [place]-Castor Oil (VENELEX) OINT Apply to sacrum and bilateral buttock  every shift and as needed for prevention  . Cholecalciferol 1000 units TBDP Take 1 tablet by mouth daily  . cloNIDine (CATAPRES) 0.2 MG tablet Take 0.2 mg by mouth 2 (two) times daily.  . ferrous sulfate (FERROUSUL) 325 (65 FE) MG tablet Take 325 mg by mouth daily with breakfast.  . ipratropium-albuterol (DUONEB) 0.5-2.5 (3) MG/3ML SOLN Take 3 mLs by nebulization every 6 (six) hours as needed.  Marland Kitchen levothyroxine (SYNTHROID, LEVOTHROID) 175 MCG tablet Take 175 mcg by mouth daily before breakfast.  . losartan (COZAAR) 50 MG tablet Take 50 mg by mouth daily.  . Multiple Vitamins-Minerals (MULTIVITAMIN WITH MINERALS) tablet Take 1 tablet by mouth daily.  . polyethylene glycol (MIRALAX / GLYCOLAX) packet Take 17 g by mouth every evening.  . ranitidine (ZANTAC) 150 MG tablet Take 150 mg by mouth daily.  . sennosides-docusate sodium (SENOKOT-S) 8.6-50 MG tablet Take 1 tablet by mouth at bedtime.  . simvastatin (ZOCOR) 10 MG tablet Take 10 mg by mouth at bedtime.  . traMADol (ULTRAM) 50 MG tablet Take 2 tablets (100 mg total) by mouth at bedtime.  . [DISCONTINUED] cloNIDine (CATAPRES) 0.1 MG tablet Take 0.1 mg by mouth 2 (two) times daily.   No facility-administered encounter medications on file as of 08/21/2018.     Review of Systems   This is limited secondary to dementia please see HPI  Immunization History  Administered Date(s) Administered  . Influenza,inj,Quad PF,6+ Mos 05/04/2015  . Influenza-Unspecified 05/28/2016, 06/03/2017  . Pneumococcal Conjugate-13 06/29/2016  . Pneumococcal-Unspecified 06/10/2016  . Tdap 05/12/2017   Pertinent  Health Maintenance Due  Topic Date Due  . INFLUENZA VACCINE  Completed  . DEXA SCAN  Completed  . PNA vac Low Risk Adult  Completed   Fall Risk  05/29/2018 05/09/2017  Falls in the past year? No No   Functional Status Survey:    Vitals:   08/21/18 1543  BP: 138/76  Pulse: 68  Resp: 17  Temp: 98.5 F (36.9 C)  TempSrc: Oral   Manual blood  pressure later in the day was 122/80 Physical Exam In general this is a elderly female in no distress sitting comfortably in her wheelchair.  Her skin is warm and dry.  Eyes sclera and conjunctive are clear she just really gently completed a course of ocu-flex for left eye conjunctivitis she does have ectropion of her left lower eyelid  Nose she does appear to have some clear drainage.  Oropharynx is clear mucous membranes moist she did not open her mouth very wide.  Chest there is poor respiratory effort but could not really appreciate any significant congestion there is no labored breathing.  Heart he has distant heart sounds from what I could ascertain regular rate and rhythm radial pulse was regular she has baseline chronic venous stasis edema.  Abdomen is soft does not appear to be tender there are positive bowel sounds.  Skill skeletal has contracture of her right hand and fingers this is at baseline has general frailty with significant  lower extremity weakness which is not new.  Neurologic as noted above she is alert she is cooperative with exam.  Psych she is oriented to self is pleasant does follow simple verbal commands Labs reviewed: Recent Labs    12/07/17 0804 02/01/18 0710 05/31/18 0901  NA 142 139 143  K 3.7 3.7 4.4  CL 106 104 106  CO2 26 27 26   GLUCOSE 87 96 90  BUN 31* 21* 23  CREATININE 0.81 0.89 0.78  CALCIUM 8.5* 8.8* 9.2   No results for input(s): AST, ALT, ALKPHOS, BILITOT, PROT, ALBUMIN in the last 8760 hours. Recent Labs    09/26/17 1551 12/07/17 0804 02/01/18 0710 05/31/18 0901  WBC 7.7 3.9* 4.5 5.0  NEUTROABS 4.3 1.5* 2.0  --   HGB 11.2* 10.6* 11.0* 12.2  HCT 35.3* 34.0* 35.0* 38.2  MCV 90.1 91.6 90.9 93.2  PLT 264 227 213 244   Lab Results  Component Value Date   TSH 1.076 05/31/2018   No results found for: HGBA1C Lab Results  Component Value Date   CHOL 115 08/10/2017   HDL 50 08/10/2017   LDLCALC 51 08/10/2017   TRIG 70  08/10/2017   CHOLHDL 2.3 08/10/2017    Significant Diagnostic Results in last 30 days:  No results found.  Assessment/Plan  #1 suspected allergy symptoms she does appear to have some nasal drainage will order Flonase 1 spray twice daily to each nostril for 5 days-also will add a short course of Claritin 10 mg a day for 5 days and monitor.  Monitor vital signs pulse ox every shift for 72 hours to keep an eye on her respiratory status she does have a history of URIs and at times has required pretty aggressive treatment at this point she appears stable but this will have to be watched.  2.  Hypertension she appears to be responding well to the increase in clonidine currently 0.2 mg twice daily she is also on Cozaar 50 mg a day blood pressures today appear to be improved.  [id number]

## 2018-08-28 ENCOUNTER — Encounter: Payer: Self-pay | Admitting: Internal Medicine

## 2018-08-28 ENCOUNTER — Non-Acute Institutional Stay (SKILLED_NURSING_FACILITY): Payer: Medicare Other | Admitting: Internal Medicine

## 2018-08-28 DIAGNOSIS — I10 Essential (primary) hypertension: Secondary | ICD-10-CM | POA: Diagnosis not present

## 2018-08-28 DIAGNOSIS — J411 Mucopurulent chronic bronchitis: Secondary | ICD-10-CM | POA: Diagnosis not present

## 2018-08-28 DIAGNOSIS — H1033 Unspecified acute conjunctivitis, bilateral: Secondary | ICD-10-CM | POA: Diagnosis not present

## 2018-08-28 DIAGNOSIS — R0989 Other specified symptoms and signs involving the circulatory and respiratory systems: Secondary | ICD-10-CM | POA: Diagnosis not present

## 2018-08-28 DIAGNOSIS — R05 Cough: Secondary | ICD-10-CM | POA: Diagnosis not present

## 2018-08-28 NOTE — Progress Notes (Signed)
Location:    [facility] Room Number: 157/W Place of Service:  SNF [phone]) Provider:  [name], MD  Patient Care Team: [name], MD as PCP - General (Internal Medicine)  Extended Emergency Contact Information Primary Emergency Contact: [name] Address: [address]          [place],  [place] Home Phone: [phone] Relation: None Secondary Emergency Contact: [place], [place] [address] [name] of [place] Phone: [phone] Relation: Grandson  Code Status:  DNR Goals of care: Advanced Directive information Advanced Directives 08/28/2018  Does Patient Have a Medical Advance Directive? Yes  Type of Advance Directive Out of facility DNR (pink MOST or yellow form)  Does patient want to make changes to medical advance directive? No - Patient declined  Copy of [place] in Chart? No - copy requested  Would patient like information on creating a medical advance directive? -  Pre-existing out of facility DNR order (yellow form or pink MOST form) -     Chief Complaint  Patient presents with  . Acute Visit    Congestion  As well as recurrent eye drainage  HPI:  Pt is a 84 y.o. female seen today for an acute visit for family concerns that she has had recurrent congestion- also apparently a cough yesterday- with discharge phlegm of yellow-greenish consistency.  Also apparently recurrence of what appears to be ocular conjunctivitis.  Patient is a long-term resident of facility with a history of dementia as well as hypothyroidism hypertension anemia and osteoporosis.  She does have at times recurrent respiratory issues and bronchitis even if chest x-ray does not show any acute process.  Daughter is concerned saying that she has had some increased congestion over the weekend-she just completed a course of Flonase and Claritin for suspected allergic rhinitis.  She also was  treated recently with Ocuflox for conjunctivitis and this appears to had improved but now appears to be back.  Patient herself is a poor historian secondary to dementia but according to her daughter she had a pretty good appetite at lunch and appears relatively at her baseline.  Vital signs appear to be stable   Past Medical History:  Diagnosis Date  . Anemia   . Arthritis   . Bradycardia   . Cancer [facility])    had breast cancer 8 yrs ago  . 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 GERD (gastroesophageal reflux disease)   . HCAP (healthcare-associated pneumonia)   . Hypertension   . Hypokalemia   . Hypomagnesemia   . Osteoarthritis   . Pancreatitis, acute 05/03/2015  . Thyroid disease   . Urinary tract infection 02/07/16   Coag negative staph. Nitrofurantoin prescribed   . UTI (lower urinary tract infection) 04/03/2016   Proteus treated with Septra DS   Past Surgical History:  Procedure Laterality Date  . BREAST SURGERY    . ERCP N/A 01/22/2016   Procedure: ENDOSCOPIC RETROGRADE CHOLANGIOPANCREATOGRAPHY (ERCP) With biliary sphincterotomy and stone extraction ;  Surgeon: Rogene Houston, MD;  Location: [facility] ORS;  Service: Endoscopy;  Laterality: N/A;  . ESOPHAGOGASTRODUODENOSCOPY N/A 05/07/2015   Procedure: ESOPHAGOGASTRODUODENOSCOPY (EGD);  Surgeon: Rogene Houston, MD;  Location: [facility] ENDO SUITE;  Service: Endoscopy;  Laterality: N/A;    Allergies  Allergen Reactions  . Codeine Nausea And Vomiting  . Nsaids     Outpatient Encounter Medications  as of 08/28/2018  Medication Sig  . acetaminophen (TYLENOL) 325 MG tablet Take 650 mg by mouth every 6 (six) hours as needed.  Marland Kitchen alendronate (FOSAMAX) 70 MG tablet Take 70 mg by mouth once a week. Take with a full glass of water on an empty stomach.  Roseanne Kaufman Peru-Castor Oil (VENELEX) OINT Apply to sacrum and bilateral buttock every shift and as needed for prevention  . Cholecalciferol 1000 units TBDP Take 1 tablet by  mouth daily  . cloNIDine (CATAPRES) 0.2 MG tablet Take 0.2 mg by mouth 2 (two) times daily.  . ferrous sulfate (FERROUSUL) 325 (65 FE) MG tablet Take 325 mg by mouth daily with breakfast.  . ipratropium-albuterol (DUONEB) 0.5-2.5 (3) MG/3ML SOLN Take 3 mLs by nebulization every 6 (six) hours as needed.  Marland Kitchen levothyroxine (SYNTHROID, LEVOTHROID) 175 MCG tablet Take 175 mcg by mouth daily before breakfast.  . losartan (COZAAR) 50 MG tablet Take 50 mg by mouth daily.  . Multiple Vitamins-Minerals (MULTIVITAMIN WITH MINERALS) tablet Take 1 tablet by mouth daily.  . polyethylene glycol (MIRALAX / GLYCOLAX) packet Take 17 g by mouth every evening.  . ranitidine (ZANTAC) 150 MG tablet Take 150 mg by mouth daily.  . sennosides-docusate sodium (SENOKOT-S) 8.6-50 MG tablet Take 1 tablet by mouth at bedtime.  . simvastatin (ZOCOR) 10 MG tablet Take 10 mg by mouth at bedtime.  . traMADol (ULTRAM) 50 MG tablet Take 2 tablets (100 mg total) by mouth at bedtime.   No facility-administered encounter medications on file as of 08/28/2018.     Review of Systems   Largely unobtainable secondary to dementia please see HPI  Immunization History  Administered Date(s) Administered  . Influenza,inj,Quad PF,6+ Mos 05/04/2015  . Influenza-Unspecified 05/28/2016, 06/03/2017  . Pneumococcal Conjugate-13 06/29/2016  . Pneumococcal-Unspecified 06/10/2016  . Tdap 05/12/2017   Pertinent  Health Maintenance Due  Topic Date Due  . INFLUENZA VACCINE  Completed  . DEXA SCAN  Completed  . PNA vac Low Risk Adult  Completed   Fall Risk  05/29/2018 05/09/2017  Falls in the past year? No No   Functional Status Survey:    Vitals:   08/28/18 1520  BP: 134/72  Pulse: 77  Resp: 18  Temp: 98 F (36.7 C)  TempSrc: Oral  SpO2: 99%    Physical Exam   In general this is a well-nourished elderly female does not appear to be in any distress sitting in her wheelchair.  Her skin is warm and dry.  Eyes sclera and  conjunctive she does have some cream-colored discharge most noticeably from her left eye with some erythema of her conjunctive continues with ectropion of her left lower eyelid-  Nose does have some yellowish-greenish discharge at times.  Oropharynx is clear mucous membranes moist.  Chest has somewhat poor respiratory effort with shallow air entry has some bronchial sounds there is no labored breathing.  Heart is regular rate and rhythm without murmur gallop or rub continues with baseline lower extremity edema venous stasis changes.  Abdomen is soft to over and at baseline nontender with positive bowel sounds.  Musculoskeletal continues with contracture of her right hand and fingers with general frailty and lower extremity weakness at baseline.  Neurologic she is alert and cooperative with exam which is her norm.  Psych she is oriented to self does follow simple verbal commands with prompting  Labs reviewed: Recent Labs    12/07/17 0804 02/01/18 0710 05/31/18 0901  NA 142 139 143  K 3.7 3.7  4.4  CL 106 104 106  CO2 26 27 26   GLUCOSE 87 96 90  BUN 31* 21* 23  CREATININE 0.81 0.89 0.78  CALCIUM 8.5* 8.8* 9.2   No results for input(s): AST, ALT, ALKPHOS, BILITOT, PROT, ALBUMIN in the last 8760 hours. Recent Labs    09/26/17 1551 12/07/17 0804 02/01/18 0710 05/31/18 0901  WBC 7.7 3.9* 4.5 5.0  NEUTROABS 4.3 1.5* 2.0  --   HGB 11.2* 10.6* 11.0* 12.2  HCT 35.3* 34.0* 35.0* 38.2  MCV 90.1 91.6 90.9 93.2  PLT 264 227 213 244   Lab Results  Component Value Date   TSH 1.076 05/31/2018   No results found for: HGBA1C Lab Results  Component Value Date   CHOL 115 08/10/2017   HDL 50 08/10/2017   LDLCALC 51 08/10/2017   TRIG 70 08/10/2017   CHOLHDL 2.3 08/10/2017    Significant Diagnostic Results in last 30 days:  No results found.  Assessment/Plan  #1- recurrent conjunctivitis we will give her a 7-day course of Ocuflox 2 drops 4 times daily to each eye and  monitor.  2.  History of possible developing URI--question- sinusitis?  With continued congestion and drainage- she is vulnerable in this regards I did discuss this with her daughter and will do a short course of Levaquin for 7days- also will add PRNMuccinex twice daily for 7 days she does have duo nebs PRN every 6 hours as needed-.  Also will continue the Flonase for 5 additional days.  At this point did not really hear any wheezing on exam but will await chest x-ray results as well as continue monitoring of vital signs pulse ox every shift for 72 hours  We will update lab work including CBC metabolic panel to look for any elevated white count or electrolyte abnormalities   3.  Hypertension it appears this has improved since clonidine was recently increased continues on clonidine 0.2 mg twice daily in addition to losartan 50 mg a day   [id number]

## 2018-08-29 ENCOUNTER — Encounter (HOSPITAL_COMMUNITY)
Admission: RE | Admit: 2018-08-29 | Discharge: 2018-08-29 | Disposition: A | Discharge status: Home / Self Care | Payer: Medicare Other | Source: Skilled Nursing Facility | Attending: Internal Medicine | Admitting: Internal Medicine

## 2018-08-29 ENCOUNTER — Other Ambulatory Visit: Payer: Self-pay

## 2018-08-29 DIAGNOSIS — H1032 Unspecified acute conjunctivitis, left eye: Secondary | ICD-10-CM | POA: Diagnosis not present

## 2018-08-29 DIAGNOSIS — M1990 Unspecified osteoarthritis, unspecified site: Secondary | ICD-10-CM | POA: Diagnosis not present

## 2018-08-29 DIAGNOSIS — R1312 Dysphagia, oropharyngeal phase: Secondary | ICD-10-CM | POA: Insufficient documentation

## 2018-08-29 DIAGNOSIS — R05 Cough: Secondary | ICD-10-CM | POA: Insufficient documentation

## 2018-08-29 DIAGNOSIS — I10 Essential (primary) hypertension: Secondary | ICD-10-CM | POA: Diagnosis not present

## 2018-08-29 LAB — BASIC METABOLIC PANEL
Anion gap: 8 (ref 5–15)
BUN: 22 mg/dL (ref 8–23)
CO2: 26 mmol/L (ref 22–32)
Calcium: 8.7 mg/dL — ABNORMAL LOW (ref 8.9–10.3)
Chloride: 109 mmol/L (ref 98–111)
Creatinine, Ser: 0.85 mg/dL (ref 0.44–1.00)
GFR calc Af Amer: >60 mL/min (ref >60)
GFR calc non Af Amer: 59 mL/min — ABNORMAL LOW (ref >60)
Glucose, Bld: 81 mg/dL (ref 70–99)
Potassium: 3.6 mmol/L (ref 3.5–5.1)
Sodium: 143 mmol/L (ref 135–145)

## 2018-08-29 LAB — CBC WITH DIFFERENTIAL/PLATELET
Abs Immature Granulocytes: 0.03 10*3/uL (ref 0.00–0.07)
Basophils Absolute: 0.1 10*3/uL (ref 0.0–0.1)
Basophils Relative: 1 %
EOS ABS: 0.3 10*3/uL (ref 0.0–0.5)
Eosinophils Relative: 5 %
HCT: 35.9 % — ABNORMAL LOW (ref 36.0–46.0)
Hemoglobin: 10.9 g/dL — ABNORMAL LOW (ref 12.0–15.0)
Immature Granulocytes: 1 %
Lymphocytes Relative: 31 %
Lymphs Abs: 2 10*3/uL (ref 0.7–4.0)
MCH: 28.5 pg (ref 26.0–34.0)
MCHC: 30.4 g/dL (ref 30.0–36.0)
MCV: 93.7 fL (ref 80.0–100.0)
Monocytes Absolute: 0.8 10*3/uL (ref 0.1–1.0)
Monocytes Relative: 12 %
Neutro Abs: 3.2 10*3/uL (ref 1.7–7.7)
Neutrophils Relative %: 50 %
Platelets: 262 10*3/uL (ref 150–400)
RBC: 3.83 MIL/uL — ABNORMAL LOW (ref 3.87–5.11)
RDW: 13.8 % (ref 11.5–15.5)
WBC: 6.3 10*3/uL (ref 4.0–10.5)
nRBC: 0 % (ref 0.0–0.2)

## 2018-08-29 MED ORDER — TRAMADOL HCL 50 MG PO TABS: 100.0000 mg | ORAL_TABLET | Freq: Every day | ORAL | 0 refills | Status: DC

## 2018-08-29 NOTE — Telephone Encounter (Signed)
RX Fax for [facility]@ [phone]  

## 2018-09-21 ENCOUNTER — Encounter: Payer: Self-pay | Admitting: Internal Medicine

## 2018-09-21 ENCOUNTER — Non-Acute Institutional Stay (SKILLED_NURSING_FACILITY): Payer: Medicare Other | Admitting: Internal Medicine

## 2018-09-21 DIAGNOSIS — D509 Iron deficiency anemia, unspecified: Secondary | ICD-10-CM | POA: Diagnosis not present

## 2018-09-21 DIAGNOSIS — G309 Alzheimer's disease, unspecified: Secondary | ICD-10-CM | POA: Diagnosis not present

## 2018-09-21 DIAGNOSIS — M810 Age-related osteoporosis without current pathological fracture: Secondary | ICD-10-CM

## 2018-09-21 DIAGNOSIS — J00 Acute nasopharyngitis [common cold]: Secondary | ICD-10-CM

## 2018-09-21 DIAGNOSIS — J411 Mucopurulent chronic bronchitis: Secondary | ICD-10-CM

## 2018-09-21 DIAGNOSIS — I10 Essential (primary) hypertension: Secondary | ICD-10-CM

## 2018-09-21 DIAGNOSIS — F0281 Dementia in other diseases classified elsewhere with behavioral disturbance: Secondary | ICD-10-CM | POA: Diagnosis not present

## 2018-09-21 NOTE — Progress Notes (Signed)
Location:  [facility] Room Number: 157/W Place of Service:  SNF (31) Provider: Granville Lewis PA-C  [name], MD  Patient Care Team: [name], MD as PCP - General (Internal Medicine)  Extended Emergency Contact Information Primary Emergency Contact: [name] Address: [address]          [place],  [place] Home Phone: [phone] Relation: None Secondary Emergency Contact: [place], [place] [address] [name] of [place] Phone: [phone] Relation: Grandson  Code Status:  DNR Goals of care: Advanced Directive information Advanced Directives 09/21/2018  Does Patient Have a Medical Advance Directive? Yes  Type of Advance Directive Out of facility DNR (pink MOST or yellow form)  Does patient want to make changes to medical advance directive? No - Patient declined  Copy of [place] in Chart? No - copy requested  Would patient like information on creating a medical advance directive? -  Pre-existing out of facility DNR order (yellow form or pink MOST form) -     Chief Complaint  Patient presents with  . Medical Management of Chronic Issues    Routine visit of medical management   Medical management of chronic medical conditions including dementia- hypothyroidism-hypertension-GERD-osteoporosis- iron deficiency anemia-hyperlipidemia-COPD  HPI:  Pt is a 85 y.o. female seen today for medical management of chronic diseases.  As noted above.  Patient appears to be at baseline but her family is concerned saying she has some upper respiratory-sniffles".  She has been seen before for this and was treated with Flonase as well as Mucinex and an antihistamine at times-- if she has chest congestion with concerns for COPD exacerbation she actually has had prednisone and an antibiotic.  She is a poor historian secondary to dementia but per patient and staff she is not had increased chest congestion  or cough but this will have to be watched.  In regards to other medical issues she does have a history of hypertension clonidine was recently increased and appears to have helped she also is on losartan 50 mg a day- I see listed blood pressures 108/72-111/74- I took it this afternoon however and got in a significantly elevated reading of 180/98-according to nursing at times she will have this but comes down apparently quite a bit with her medications.  She appears to be comfortable without complaint currently sitting in her wheelchair again main concern is some nasal discharge concerns for possible cold type symptoms.  In regards to other issues she does have a history osteoporosis and continues on Fosamax and vitamin D- in regards to dementia she is doing well with supportive care her weight is stable.  In regards to anemia she continues on iron hemoglobin has been relatively stable at 10.9 on lab done August 29, 2018.     Past Medical History:  Diagnosis Date  . Anemia   . Arthritis   . Bradycardia   . Cancer [facility])    had breast cancer 8 yrs ago  . 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 GERD (gastroesophageal reflux disease)   . HCAP (healthcare-associated pneumonia)   . Hypertension   . Hypokalemia   . Hypomagnesemia   . Osteoarthritis   . Pancreatitis, acute 05/03/2015  . Thyroid disease   . Urinary tract infection 02/07/16   Coag negative staph. Nitrofurantoin prescribed   . UTI (lower urinary tract infection) 04/03/2016   Proteus treated  with Septra DS   Past Surgical History:  Procedure Laterality Date  . BREAST SURGERY    . ERCP N/A 01/22/2016   Procedure: ENDOSCOPIC RETROGRADE CHOLANGIOPANCREATOGRAPHY (ERCP) With biliary sphincterotomy and stone extraction ;  Surgeon: Rogene Houston, MD;  Location: [facility] ORS;  Service: Endoscopy;  Laterality: N/A;  . ESOPHAGOGASTRODUODENOSCOPY N/A 05/07/2015   Procedure: ESOPHAGOGASTRODUODENOSCOPY (EGD);  Surgeon:  Rogene Houston, MD;  Location: [facility] ENDO [facility];  Service: Endoscopy;  Laterality: N/A;    Allergies  Allergen Reactions  . Codeine Nausea And Vomiting  . Nsaids     Outpatient Encounter Medications as of 09/21/2018  Medication Sig  . acetaminophen (TYLENOL) 325 MG tablet Take 650 mg by mouth every 6 (six) hours as needed.  Marland Kitchen alendronate (FOSAMAX) 70 MG tablet Take 70 mg by mouth once a week. Take with a full glass of water on an empty stomach.  Roseanne Kaufman Peru-Castor Oil (VENELEX) OINT Apply to sacrum and bilateral buttock every shift and as needed for prevention  . Cholecalciferol 1000 units TBDP Take 1 tablet by mouth daily  . cloNIDine (CATAPRES) 0.2 MG tablet Take 0.2 mg by mouth 2 (two) times daily.  . ferrous sulfate (FERROUSUL) 325 (65 FE) MG tablet Take 325 mg by mouth daily with breakfast.  . ipratropium-albuterol (DUONEB) 0.5-2.5 (3) MG/3ML SOLN Take 3 mLs by nebulization every 6 (six) hours as needed.  Marland Kitchen levothyroxine (SYNTHROID, LEVOTHROID) 175 MCG tablet Take 175 mcg by mouth daily before breakfast.  . losartan (COZAAR) 50 MG tablet Take 50 mg by mouth daily.  . Multiple Vitamins-Minerals (MULTIVITAMIN WITH MINERALS) tablet Take 1 tablet by mouth daily.  . polyethylene glycol (MIRALAX / GLYCOLAX) packet Take 17 g by mouth every evening.  . ranitidine (ZANTAC) 150 MG tablet Take 150 mg by mouth daily.  . sennosides-docusate sodium (SENOKOT-S) 8.6-50 MG tablet Take 1 tablet by mouth at bedtime.  . simvastatin (ZOCOR) 10 MG tablet Take 10 mg by mouth at bedtime.  . traMADol (ULTRAM) 50 MG tablet Take 2 tablets (100 mg total) by mouth at bedtime.   No facility-administered encounter medications on file as of 09/21/2018.     Review of Systems  This is limited secondary to dementia but she is not complaining of chest congestion shortness of breath- nursing does not report any issues-apparently she does have a runny nose.     Immunization History  Administered Date(s)  Administered  . Influenza,inj,Quad PF,6+ Mos 05/04/2015  . Influenza-Unspecified 05/28/2016, 06/03/2017  . Pneumococcal Conjugate-13 06/29/2016  . Pneumococcal-Unspecified 06/10/2016  . Tdap 05/12/2017   Pertinent  Health Maintenance Due  Topic Date Due  . INFLUENZA VACCINE  Completed  . DEXA SCAN  Completed  . PNA vac Low Risk Adult  Completed   Fall Risk  05/29/2018 05/09/2017  Falls in the past year? No No   Functional Status Survey:    She is afebrile pulse of 80 respirations of 18 blood pressure as noted at 180/98 O2 saturation is pending.    Physical Exam   In general this is a pleasant elderly female in no distress sitting at baseline comfortably in her wheelchair.  Her skin is warm and dry.  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  Nose appears to have some clear drainage.  Oropharynx appears to be clear mucous membranes moist.  Chest has poor respiratory effort with shallow air entry but could not really appreciate any congestion or sign of  labored breathing.  Heart is regular rate and rhythm with somewhat distant heart sounds-she has her baseline venous stasis edema bilaterally has hose on bilaterally.  Abdomen is soft nontender with positive bowel sounds.  Musculoskeletal continues with lower extremity weakness and some bilateral stiffness of her upper extremities which is baseline she does have a history of right hand contractures.  Neurologic she is alert and cooperative with exam with generalized weakness at baseline.  Psych she is oriented to self will follow simple verbal commands does answer simple straightforward questions.   Labs reviewed: Recent Labs    02/01/18 0710 05/31/18 0901 08/29/18 1020  NA 139 143 143  K 3.7 4.4 3.6  CL 104 106 109  CO2 27 26 26   GLUCOSE 96 90 81  BUN 21* 23 22  CREATININE 0.89 0.78 0.85  CALCIUM 8.8* 9.2 8.7*   No results for input(s): AST, ALT, ALKPHOS,  BILITOT, PROT, ALBUMIN in the last 8760 hours. Recent Labs    12/07/17 0804 02/01/18 0710 05/31/18 0901 08/29/18 1020  WBC 3.9* 4.5 5.0 6.3  NEUTROABS 1.5* 2.0  --  3.2  HGB 10.6* 11.0* 12.2 10.9*  HCT 34.0* 35.0* 38.2 35.9*  MCV 91.6 90.9 93.2 93.7  PLT 227 213 244 262   Lab Results  Component Value Date   TSH 1.076 05/31/2018   No results found for: HGBA1C Lab Results  Component Value Date   CHOL 115 08/10/2017   HDL 50 08/10/2017   LDLCALC 51 08/10/2017   TRIG 70 08/10/2017   CHOLHDL 2.3 08/10/2017    Significant Diagnostic Results in last 30 days:  No results found.  Assessment/Plan  #1-history of runny nose suspected cold symptoms at this point respiratory status appears stable but will have to be monitored closely with vital signs pulse ox every shift for 72 hours- will start Mucinex 600 mg twice daily for 5 days secondary to expectoration qualities-.  Also will start Flonase 1 spray each nostril twice daily for 7 days-again she will have to be watched closely but appears stable at this point.  2.  Hypertension-blood pressure appears to be spiking apparently at times will do this per nursing but comes down quickly with medication will write an order to give her her clonidine now and monitor blood pressures closely every 1 hour x 3 and if systolic is still greater than 160 after 3 hours to notify provider.  Clinically she appears stable.  She continues on losartan as well continue to monitor blood pressures at times it.  She is borderline low which makes this somewhat challenging.  3.  Dementia this appears stable her weight is stable at around 150 pounds at this point continue supportive care.  4.  History of COPD this is been stable for some time at times she will need prednisone and antibiotic for an exacerbation- again she has been started on Flonase and Claritin for cold symptoms- she does have orders for nebulizers as needed.  5.-History of osteoarthritis-she  continues on tramadol for pain as well as Tylenol as needed and this appears to be stable.  6.  History of hypothyroidism she continues on Synthroid TSH was 1.076 on October lab will monitor periodically.  7.  History of anemia she continues on iron hemoglobin has shown relative stability at 10.9 on lab done in late December.  8.  History of osteoporosis continues on Fosamax as well as vitamin D.  9.  History of GERD symptoms she is on Zantac.  10.  History of hyperlipidemia she continues on Zocor   CPT- [phone]

## 2018-09-21 NOTE — Progress Notes (Deleted)
Location:    [place] Room Number: 157/W Place of Service:  SNF [phone]) Provider:  [name], MD  Patient Care Team: [name], MD as PCP - General (Internal Medicine)  Extended Emergency Contact Information Primary Emergency Contact: [name] Address: [address]          [place],  [place] Home Phone: [phone] Relation: None Secondary Emergency Contact: [place], [place] [address] [name] of [place] Phone: [phone] Relation: Grandson  Code Status:  DNR Goals of care: Advanced Directive information Advanced Directives 09/21/2018  Does Patient Have a Medical Advance Directive? Yes  Type of Advance Directive Out of facility DNR (pink MOST or yellow form)  Does patient want to make changes to medical advance directive? No - Patient declined  Copy of [place] in Chart? No - copy requested  Would patient like information on creating a medical advance directive? -  Pre-existing out of facility DNR order (yellow form or pink MOST form) -     Chief Complaint  Patient presents with  . Acute Visit    Cold symptoms     HPI:  Pt is a 85 y.o. female seen today for an acute visit for    Past Medical History:  Diagnosis Date  . Anemia   . Arthritis   . Bradycardia   . Cancer [facility])    had breast cancer 8 yrs ago  . 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 GERD (gastroesophageal reflux disease)   . HCAP (healthcare-associated pneumonia)   . Hypertension   . Hypokalemia   . Hypomagnesemia   . Osteoarthritis   . Pancreatitis, acute 05/03/2015  . Thyroid disease   . Urinary tract infection 02/07/16   Coag negative staph. Nitrofurantoin prescribed   . UTI (lower urinary tract infection) 04/03/2016   Proteus treated with Septra DS   Past Surgical History:  Procedure Laterality Date  . BREAST SURGERY    . ERCP N/A 01/22/2016   Procedure:  ENDOSCOPIC RETROGRADE CHOLANGIOPANCREATOGRAPHY (ERCP) With biliary sphincterotomy and stone extraction ;  Surgeon: Rogene Houston, MD;  Location: [facility] ORS;  Service: Endoscopy;  Laterality: N/A;  . ESOPHAGOGASTRODUODENOSCOPY N/A 05/07/2015   Procedure: ESOPHAGOGASTRODUODENOSCOPY (EGD);  Surgeon: Rogene Houston, MD;  Location: [facility] ENDO SUITE;  Service: Endoscopy;  Laterality: N/A;    Allergies  Allergen Reactions  . Codeine Nausea And Vomiting  . Nsaids     Outpatient Encounter Medications as of 09/21/2018  Medication Sig  . acetaminophen (TYLENOL) 325 MG tablet Take 650 mg by mouth every 6 (six) hours as needed.  Marland Kitchen alendronate (FOSAMAX) 70 MG tablet Take 70 mg by mouth once a week. Take with a full glass of water on an empty stomach.  Roseanne Kaufman Peru-Castor Oil (VENELEX) OINT Apply to sacrum and bilateral buttock every shift and as needed for prevention  . Cholecalciferol 1000 units TBDP Take 1 tablet by mouth daily  . cloNIDine (CATAPRES) 0.2 MG tablet Take 0.2 mg by mouth 2 (two) times daily.  . ferrous sulfate (FERROUSUL) 325 (65 FE) MG tablet Take 325 mg by mouth daily with breakfast.  . ipratropium-albuterol (DUONEB) 0.5-2.5 (3) MG/3ML SOLN Take 3 mLs by nebulization every 6 (six) hours as needed.  Marland Kitchen levothyroxine (SYNTHROID, LEVOTHROID) 175 MCG tablet Take 175 mcg by mouth daily before breakfast.  . losartan (COZAAR) 50 MG tablet Take  50 mg by mouth daily.  . Multiple Vitamins-Minerals (MULTIVITAMIN WITH MINERALS) tablet Take 1 tablet by mouth daily.  . polyethylene glycol (MIRALAX / GLYCOLAX) packet Take 17 g by mouth every evening.  . ranitidine (ZANTAC) 150 MG tablet Take 150 mg by mouth daily.  . sennosides-docusate sodium (SENOKOT-S) 8.6-50 MG tablet Take 1 tablet by mouth at bedtime.  . simvastatin (ZOCOR) 10 MG tablet Take 10 mg by mouth at bedtime.  . traMADol (ULTRAM) 50 MG tablet Take 2 tablets (100 mg total) by mouth at bedtime.   No facility-administered encounter medications  on file as of 09/21/2018.     Review of Systems  Immunization History  Administered Date(s) Administered  . Influenza,inj,Quad PF,6+ Mos 05/04/2015  . Influenza-Unspecified 05/28/2016, 06/03/2017  . Pneumococcal Conjugate-13 06/29/2016  . Pneumococcal-Unspecified 06/10/2016  . Tdap 05/12/2017   Pertinent  Health Maintenance Due  Topic Date Due  . INFLUENZA VACCINE  Completed  . DEXA SCAN  Completed  . PNA vac Low Risk Adult  Completed   Fall Risk  05/29/2018 05/09/2017  Falls in the past year? No No   Functional Status Survey:    There were no vitals filed for this visit. There is no height or weight on file to calculate BMI. Physical Exam  Labs reviewed: Recent Labs    02/01/18 0710 05/31/18 0901 08/29/18 1020  NA 139 143 143  K 3.7 4.4 3.6  CL 104 106 109  CO2 27 26 26   GLUCOSE 96 90 81  BUN 21* 23 22  CREATININE 0.89 0.78 0.85  CALCIUM 8.8* 9.2 8.7*   No results for input(s): AST, ALT, ALKPHOS, BILITOT, PROT, ALBUMIN in the last 8760 hours. Recent Labs    12/07/17 0804 02/01/18 0710 05/31/18 0901 08/29/18 1020  WBC 3.9* 4.5 5.0 6.3  NEUTROABS 1.5* 2.0  --  3.2  HGB 10.6* 11.0* 12.2 10.9*  HCT 34.0* 35.0* 38.2 35.9*  MCV 91.6 90.9 93.2 93.7  PLT 227 213 244 262   Lab Results  Component Value Date   TSH 1.076 05/31/2018   No results found for: HGBA1C Lab Results  Component Value Date   CHOL 115 08/10/2017   HDL 50 08/10/2017   LDLCALC 51 08/10/2017   TRIG 70 08/10/2017   CHOLHDL 2.3 08/10/2017    Significant Diagnostic Results in last 30 days:  No results found.  Assessment/Plan There are no diagnoses linked to this encounter.      Oralia Manis, [place]

## 2018-09-26 ENCOUNTER — Other Ambulatory Visit: Payer: Self-pay

## 2018-09-26 MED ORDER — TRAMADOL HCL 50 MG PO TABS: 100.0000 mg | ORAL_TABLET | Freq: Every day | ORAL | 0 refills | Status: DC

## 2018-09-26 NOTE — Telephone Encounter (Signed)
RX Fax for [facility]@ [phone]  

## 2018-10-05 DIAGNOSIS — M2042 Other hammer toe(s) (acquired), left foot: Secondary | ICD-10-CM | POA: Diagnosis not present

## 2018-10-05 DIAGNOSIS — I739 Peripheral vascular disease, unspecified: Secondary | ICD-10-CM | POA: Diagnosis not present

## 2018-10-05 DIAGNOSIS — B351 Tinea unguium: Secondary | ICD-10-CM | POA: Diagnosis not present

## 2018-10-05 DIAGNOSIS — L603 Nail dystrophy: Secondary | ICD-10-CM | POA: Diagnosis not present

## 2018-10-05 DIAGNOSIS — M2041 Other hammer toe(s) (acquired), right foot: Secondary | ICD-10-CM | POA: Diagnosis not present

## 2018-10-25 ENCOUNTER — Non-Acute Institutional Stay (SKILLED_NURSING_FACILITY): Payer: Medicare Other | Admitting: Adult Health

## 2018-10-25 ENCOUNTER — Encounter: Payer: Self-pay | Admitting: Adult Health

## 2018-10-25 DIAGNOSIS — K5909 Other constipation: Secondary | ICD-10-CM | POA: Diagnosis not present

## 2018-10-25 DIAGNOSIS — G8929 Other chronic pain: Secondary | ICD-10-CM | POA: Diagnosis not present

## 2018-10-25 DIAGNOSIS — K219 Gastro-esophageal reflux disease without esophagitis: Secondary | ICD-10-CM

## 2018-10-25 DIAGNOSIS — M810 Age-related osteoporosis without current pathological fracture: Secondary | ICD-10-CM | POA: Diagnosis not present

## 2018-10-25 DIAGNOSIS — F02818 Dementia in other diseases classified elsewhere, unspecified severity, with other behavioral disturbance: Secondary | ICD-10-CM

## 2018-10-25 DIAGNOSIS — E785 Hyperlipidemia, unspecified: Secondary | ICD-10-CM

## 2018-10-25 DIAGNOSIS — F0281 Dementia in other diseases classified elsewhere with behavioral disturbance: Secondary | ICD-10-CM

## 2018-10-25 DIAGNOSIS — G301 Alzheimer's disease with late onset: Secondary | ICD-10-CM

## 2018-10-25 DIAGNOSIS — R52 Pain, unspecified: Secondary | ICD-10-CM | POA: Diagnosis not present

## 2018-10-25 DIAGNOSIS — E038 Other specified hypothyroidism: Secondary | ICD-10-CM | POA: Diagnosis not present

## 2018-10-25 DIAGNOSIS — J411 Mucopurulent chronic bronchitis: Secondary | ICD-10-CM | POA: Diagnosis not present

## 2018-10-25 DIAGNOSIS — D509 Iron deficiency anemia, unspecified: Secondary | ICD-10-CM

## 2018-10-25 NOTE — Progress Notes (Signed)
Location:   [place] Room Number: [place] of Service:  SNF (31)   CODE STATUS: DNR  Allergies  Allergen Reactions  . Codeine Nausea And Vomiting  . Nsaids     Chief Complaint  Patient presents with  . Medical Management of Chronic Issues    Mucopurulent chronic bronchitis; gastroesophageal reflux disease without esophagitis; other specified hypothyroidism/ care plan meeting.     HPI:  She is a 85 year old long term resident of this facility being seen for the management of her chronic illnesses: chronic bronchitis; gerd; hypothyroidism. There are no reports of coughing or shortness of breath; no reports of changes in appetite; no significant weight change; no reports of anxiety or agitation. Her family is present; have no concerns at this time.   Past Medical History:  Diagnosis Date  . Anemia   . Arthritis   . Bradycardia   . Cancer [facility])    had breast cancer 8 yrs ago  . 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 GERD (gastroesophageal reflux disease)   . HCAP (healthcare-associated pneumonia)   . Hypertension   . Hypokalemia   . Hypomagnesemia   . Osteoarthritis   . Pancreatitis, acute 05/03/2015  . Thyroid disease   . Urinary tract infection 02/07/16   Coag negative staph. Nitrofurantoin prescribed   . UTI (lower urinary tract infection) 04/03/2016   Proteus treated with Septra DS    Past Surgical History:  Procedure Laterality Date  . BREAST SURGERY    . ERCP N/A 01/22/2016   Procedure: ENDOSCOPIC RETROGRADE CHOLANGIOPANCREATOGRAPHY (ERCP) With biliary sphincterotomy and stone extraction ;  Surgeon: Rogene Houston, MD;  Location: [facility] ORS;  Service: Endoscopy;  Laterality: N/A;  . ESOPHAGOGASTRODUODENOSCOPY N/A 05/07/2015   Procedure: ESOPHAGOGASTRODUODENOSCOPY (EGD);  Surgeon: Rogene Houston, MD;  Location: [facility] ENDO SUITE;  Service: Endoscopy;  Laterality: N/A;    Social History   Socioeconomic History  .  Marital status: Widowed    Spouse name: Not on file  . Number of children: Not on file  . Years of education: Not on file  . Highest education level: Not on file  Occupational History  . Not on file  Social Needs  . Financial resource strain: Not hard at all  . Food insecurity:    Worry: Never true    Inability: Never true  . Transportation needs:    Medical: No    Non-medical: No  Tobacco Use  . Smoking status: Never Smoker  . Smokeless tobacco: Never Used  Substance and Sexual Activity  . Alcohol use: No  . Drug use: No  . Sexual activity: Not on file  Lifestyle  . Physical activity:    Days per week: 0 days    Minutes per session: 0 min  . Stress: Only a little  Relationships  . Social connections:    Talks on phone: Once a week    Gets together: Never    Attends religious service: Never    Active member of club or organization: No    Attends meetings of clubs or organizations: Never    Relationship status: Widowed  . Intimate partner violence:    Fear of current or ex partner: No    Emotionally abused: No    Physically abused: No    Forced sexual activity: No  Other Topics Concern  . Not on file  Social History Narrative  . Not on file  Family History  Problem Relation Age of Onset  . Cancer Neg Hx   . Diabetes Neg Hx   . Heart disease Neg Hx   . Stroke Neg Hx       VITAL SIGNS BP (!) 142/74   Pulse 78   Temp 98.1 F (36.7 C)   Resp 18   Ht 5\' 4"  (1.626 m)   Wt 149 lb (67.6 kg)   BMI 25.58 kg/m   Outpatient Encounter Medications as of 10/25/2018  Medication Sig  . acetaminophen (TYLENOL) 325 MG tablet Take 650 mg by mouth every 6 (six) hours as needed.  Marland Kitchen alendronate (FOSAMAX) 70 MG tablet Take 70 mg by mouth once a week. On Sunday.  Take with a full glass of water on an empty stomach.  Roseanne Kaufman Peru-Castor Oil (VENELEX) OINT Apply to sacrum and bilateral buttock every shift and as needed for prevention  . Cholecalciferol 1000 units TBDP Take  1 tablet by mouth daily  . cloNIDine (CATAPRES) 0.2 MG tablet Take 0.2 mg by mouth 2 (two) times daily.  . ferrous sulfate (FERROUSUL) 325 (65 FE) MG tablet Take 325 mg by mouth daily with breakfast.  . ipratropium-albuterol (DUONEB) 0.5-2.5 (3) MG/3ML SOLN Take 3 mLs by nebulization every 6 (six) hours as needed.  Marland Kitchen levothyroxine (SYNTHROID, LEVOTHROID) 175 MCG tablet Take 175 mcg by mouth daily before breakfast.  . losartan (COZAAR) 50 MG tablet Take 50 mg by mouth daily.  . Multiple Vitamins-Minerals (MULTIVITAMIN WITH MINERALS) tablet Take 1 tablet by mouth daily.  . NON FORMULARY Diet Type:  Mechanical soft, NAS  . oseltamivir (TAMIFLU) 75 MG capsule Take 75 mg by mouth at bedtime.  . polyethylene glycol (MIRALAX / GLYCOLAX) packet Take 17 g by mouth every evening.  . ranitidine (ZANTAC) 150 MG tablet Take 150 mg by mouth daily.  . sennosides-docusate sodium (SENOKOT-S) 8.6-50 MG tablet Take 1 tablet by mouth at bedtime.  . simvastatin (ZOCOR) 10 MG tablet Take 10 mg by mouth at bedtime.  . traMADol (ULTRAM) 50 MG tablet Take 2 tablets (100 mg total) by mouth at bedtime.   No facility-administered encounter medications on file as of 10/25/2018.      SIGNIFICANT DIAGNOSTIC EXAMS  LABS REVIEWED TODAY:   08-10-17: chol 115; ldl 51; trig 70; hdl 50  05-31-18: wbc 5.0; hgb 11.0; hct 35.0; mcv 90.9 plt 244; glucose 90; bun 23; creat 0.78; k+ 4.4; na++ 143; ca 9.2 tsh 1.076 08-29-18: wbc 6.3; hgb 10.9; hct 35.9; mcv 93.7 plt 262; glucose 81; bun 22; creat 0.85; k+ 3.6; na++ 143; ca 8.7   Review of Systems  Unable to perform ROS: Dementia (unable to participate )    Physical Exam Constitutional:      General: She is not in acute distress.    Appearance: She is well-developed. She is not diaphoretic.  Neck:     Musculoskeletal: Neck supple.     Thyroid: No thyromegaly.  Cardiovascular:     Rate and Rhythm: Normal rate and regular rhythm.     Pulses: Normal pulses.     Heart sounds:  Murmur present.     Comments: 2/6 Pulmonary:     Effort: Pulmonary effort is normal. No respiratory distress.     Breath sounds: Normal breath sounds.  Abdominal:     General: Bowel sounds are normal. There is no distension.     Palpations: Abdomen is soft.     Tenderness: There is no abdominal tenderness.  Musculoskeletal:  Right lower leg: Edema present.     Left lower leg: Edema present.     Comments: Is able to move all extremities Has bilateral hand tremor  3-4+ bilateral lower extremity edema   Lymphadenopathy:     Cervical: No cervical adenopathy.  Skin:    General: Skin is warm and dry.  Neurological:     Mental Status: She is alert. Mental status is at baseline.  Psychiatric:        Mood and Affect: Mood normal.      ASSESSMENT/ PLAN:  TODAY;   1. Essential hypertension: is stable b/p 142/74: will continue cozaar 50 mg daily clonidine 0.2 mg twice daily  2. Mucopurulent chronic bronchitis: is stable no recent infections: will continue duoneb every 6 hours as needed   3. GERD without esophagitis: is stable will continue zantac 150 mg daily   4. Other hypothyroidism: is stable tsh 1.076: will continue synthroid 175 mcg daily   5. 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 Chronic constipation: is stable will continue senna s daily and miralax daily   7. Age related osteoporosis without current pathological fracture: is stable will continue fosamax 70 mg weekly is on supplements.   8. Unspecified iron deficiency anemia: is stable hgb 10.9; will continue iron daily   9. Unspecified hyperlipidemia: is stable LDL 51 will continue zocor 109 mg daily   10. Chronic generalized pain: is stable will continue ultram 100 mg nightly    Will check lipids and liver function        MD is aware of resident's narcotic use and is in agreement with current plan of care. We will attempt to  wean resident as apropriate   Ok Edwards NP [facility]  Contact [phone] Monday through Friday 8am- 5pm  After hours call [phone]

## 2018-11-02 DIAGNOSIS — G8929 Other chronic pain: Secondary | ICD-10-CM | POA: Insufficient documentation

## 2018-11-02 DIAGNOSIS — R52 Pain, unspecified: Secondary | ICD-10-CM

## 2018-11-02 DIAGNOSIS — K5909 Other constipation: Secondary | ICD-10-CM | POA: Insufficient documentation

## 2018-11-03 ENCOUNTER — Encounter (HOSPITAL_COMMUNITY)
Admission: RE | Admit: 2018-11-03 | Discharge: 2018-11-03 | Disposition: A | Discharge status: Home / Self Care | Payer: Medicare Other | Source: Skilled Nursing Facility | Attending: Adult Health | Admitting: Adult Health

## 2018-11-03 DIAGNOSIS — H1032 Unspecified acute conjunctivitis, left eye: Secondary | ICD-10-CM | POA: Insufficient documentation

## 2018-11-03 DIAGNOSIS — R05 Cough: Secondary | ICD-10-CM | POA: Diagnosis not present

## 2018-11-03 DIAGNOSIS — R1312 Dysphagia, oropharyngeal phase: Secondary | ICD-10-CM | POA: Diagnosis not present

## 2018-11-03 DIAGNOSIS — E785 Hyperlipidemia, unspecified: Secondary | ICD-10-CM | POA: Diagnosis not present

## 2018-11-03 DIAGNOSIS — M1990 Unspecified osteoarthritis, unspecified site: Secondary | ICD-10-CM | POA: Insufficient documentation

## 2018-11-03 DIAGNOSIS — I10 Essential (primary) hypertension: Secondary | ICD-10-CM | POA: Insufficient documentation

## 2018-11-03 LAB — HEPATIC FUNCTION PANEL
ALT: 14 U/L (ref 0–44)
AST: 19 U/L (ref 15–41)
Albumin: 3.1 g/dL — ABNORMAL LOW (ref 3.5–5.0)
Alkaline Phosphatase: 42 U/L (ref 38–126)
Bilirubin, Direct: 0.1 mg/dL (ref 0.0–0.2)
Indirect Bilirubin: 0.2 mg/dL — ABNORMAL LOW (ref 0.3–0.9)
Total Bilirubin: 0.3 mg/dL (ref 0.3–1.2)
Total Protein: 6.9 g/dL (ref 6.5–8.1)

## 2018-11-03 LAB — LIPID PANEL
CHOL/HDL RATIO: 2.5 ratio
Cholesterol: 101 mg/dL (ref 0–200)
HDL: 40 mg/dL — AB (ref >40)
LDL Cholesterol: 43 mg/dL (ref 0–99)
Triglycerides: 88 mg/dL (ref <150)
VLDL: 18 mg/dL (ref 0–40)

## 2018-11-06 ENCOUNTER — Other Ambulatory Visit: Payer: Self-pay | Admitting: Adult Health

## 2018-11-06 MED ORDER — TRAMADOL HCL 50 MG PO TABS: 100.0000 mg | ORAL_TABLET | Freq: Every day | ORAL | 0 refills | Status: DC

## 2018-11-23 ENCOUNTER — Non-Acute Institutional Stay (SKILLED_NURSING_FACILITY): Payer: Medicare Other | Admitting: Internal Medicine

## 2018-11-23 ENCOUNTER — Encounter: Payer: Self-pay | Admitting: Internal Medicine

## 2018-11-23 DIAGNOSIS — G301 Alzheimer's disease with late onset: Secondary | ICD-10-CM | POA: Diagnosis not present

## 2018-11-23 DIAGNOSIS — F0281 Dementia in other diseases classified elsewhere with behavioral disturbance: Secondary | ICD-10-CM | POA: Diagnosis not present

## 2018-11-23 DIAGNOSIS — I10 Essential (primary) hypertension: Secondary | ICD-10-CM

## 2018-11-23 DIAGNOSIS — F02818 Dementia in other diseases classified elsewhere, unspecified severity, with other behavioral disturbance: Secondary | ICD-10-CM

## 2018-11-23 DIAGNOSIS — E038 Other specified hypothyroidism: Secondary | ICD-10-CM

## 2018-11-23 DIAGNOSIS — M810 Age-related osteoporosis without current pathological fracture: Secondary | ICD-10-CM | POA: Diagnosis not present

## 2018-11-23 NOTE — Progress Notes (Signed)
Location:  [place] Room Number: [place] of Service:  SNF [phone]) Provider:  Veleta Miners, MD  [name], MD  Patient Care Team: [name], MD as PCP - General (Internal Medicine)  Extended Emergency Contact Information Primary Emergency Contact: [name] Address: [address]          [place],  [place] Home Phone: [phone] Relation: None Secondary Emergency Contact: [place], [place] [address] [name] of [place] Phone: [phone] Relation: Grandson  Code Status:  DNR Goals of care: Advanced Directive information Advanced Directives 11/23/2018  Does Patient Have a Medical Advance Directive? Yes  Type of Advance Directive Out of facility DNR (pink MOST or yellow form)  Does patient want to make changes to medical advance directive? No - Patient declined  Copy of [place] in Chart? -  Would patient like information on creating a medical advance directive? -  Pre-existing out of facility DNR order (yellow form or pink MOST form) Yellow form placed in chart (order not valid for inpatient use)     Chief Complaint  Patient presents with  . Medical Management of Chronic Issues    Hypertension, Hypothyroidism    HPI:  Pt is a 85 y.o. female seen today for medical management of chronic diseases.   Patient has H/O Dementia, hypothyroidism, Hypertension and anemiaand Osteoporosis.. She is long term resident of facility She cannot give any history due to her dementia She is doing good per nurses given that her family cannot come and visit her due to Restrictions She was alert and answers yes for everything Her weight is stable is slightly down to 147 lbs from 150 lbs  Past Medical History:  Diagnosis Date  . Anemia   . Arthritis   . Bradycardia   . Cancer [facility])    had breast cancer 8 yrs ago  . 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 GERD  (gastroesophageal reflux disease)   . HCAP (healthcare-associated pneumonia)   . Hypertension   . Hypokalemia   . Hypomagnesemia   . Osteoarthritis   . Pancreatitis, acute 05/03/2015  . Thyroid disease   . Urinary tract infection 02/07/16   Coag negative staph. Nitrofurantoin prescribed   . UTI (lower urinary tract infection) 04/03/2016   Proteus treated with Septra DS   Past Surgical History:  Procedure Laterality Date  . BREAST SURGERY    . ERCP N/A 01/22/2016   Procedure: ENDOSCOPIC RETROGRADE CHOLANGIOPANCREATOGRAPHY (ERCP) With biliary sphincterotomy and stone extraction ;  Surgeon: Rogene Houston, MD;  Location: [facility];  Service: Endoscopy;  Laterality: N/A;  . ESOPHAGOGASTRODUODENOSCOPY N/A 05/07/2015   Procedure: ESOPHAGOGASTRODUODENOSCOPY (EGD);  Surgeon: Rogene Houston, MD;  Location: [facility];  Service: Endoscopy;  Laterality: N/A;    Allergies  Allergen Reactions  . Codeine Nausea And Vomiting  . Nsaids     Outpatient Encounter Medications as of 11/23/2018  Medication Sig  . acetaminophen (TYLENOL) 325 MG tablet Take 650 mg by mouth every 6 (six) hours as needed.  Marland Kitchen alendronate (FOSAMAX) 70 MG tablet Take 70 mg by mouth once a week. On Sunday.  Take with a full glass of water on an empty stomach.  Roseanne Kaufman Peru-Castor Oil (VENELEX) OINT Apply to sacrum and bilateral buttock every shift and as needed for prevention  . Cholecalciferol 1000 units TBDP Take 1 tablet by mouth daily  .  cloNIDine (CATAPRES) 0.2 MG tablet Take 0.2 mg by mouth 2 (two) times daily.  . ferrous sulfate (FERROUSUL) 325 (65 FE) MG tablet Take 325 mg by mouth daily with breakfast.  . ipratropium-albuterol (DUONEB) 0.5-2.5 (3) MG/3ML SOLN Take 3 mLs by nebulization every 6 (six) hours as needed.  Marland Kitchen levothyroxine (SYNTHROID, LEVOTHROID) 175 MCG tablet Take 175 mcg by mouth daily before breakfast.  . losartan (COZAAR) 50 MG tablet Take 50 mg by mouth daily.  . Multiple Vitamins-Minerals (MULTIVITAMIN  WITH MINERALS) tablet Take 1 tablet by mouth daily.  . NON FORMULARY Diet Type:  Mechanical soft, NAS  . polyethylene glycol (MIRALAX / GLYCOLAX) packet Take 17 g by mouth every evening.  . ranitidine (ZANTAC) 150 MG tablet Take 150 mg by mouth daily.  . sennosides-docusate sodium (SENOKOT-S) 8.6-50 MG tablet Take 1 tablet by mouth at bedtime.  . simvastatin (ZOCOR) 10 MG tablet Take 10 mg by mouth at bedtime.  . traMADol (ULTRAM) 50 MG tablet Take 2 tablets (100 mg total) by mouth at bedtime.   No facility-administered encounter medications on file as of 11/23/2018.     Review of Systems  Unable to perform ROS: Dementia    Immunization History  Administered Date(s) Administered  . Influenza,inj,Quad PF,6+ Mos 05/04/2015  . Influenza-Unspecified 05/28/2016, 06/03/2017, 06/01/2018  . Pneumococcal Conjugate-13 06/29/2016  . Pneumococcal-Unspecified 06/30/2015, 06/10/2016  . Tdap 05/12/2017   Pertinent  Health Maintenance Due  Topic Date Due  . INFLUENZA VACCINE  Completed  . DEXA SCAN  Completed  . PNA vac Low Risk Adult  Completed   Fall Risk  05/29/2018 05/09/2017  Falls in the past year? No No   Functional Status Survey:    Vitals:   11/23/18 1001  BP: (!) 112/50  Pulse: (!) 51  Resp: 20  Temp: 98.7 F (37.1 C)  Weight: 147 lb 6.4 oz (66.9 kg)  Height: 5\' 4"  (1.626 m)   Body mass index is 25.3 kg/m. Physical Exam Constitutional:      Appearance: She is well-developed.  HENT:     Head: Normocephalic.     Nose: Nose normal.     Mouth/Throat:     Mouth: Mucous membranes are dry.     Pharynx: Oropharynx is clear.  Eyes:     Pupils: Pupils are equal, round, and reactive to light.  Neck:     Musculoskeletal: Neck supple.  Cardiovascular:     Rate and Rhythm: Normal rate and regular rhythm.     Heart sounds: Murmur present.  Pulmonary:     Effort: Pulmonary effort is normal. No respiratory distress.     Breath sounds: No wheezing or rales.  Abdominal:      General: Bowel sounds are normal. There is no distension.     Palpations: Abdomen is soft.     Tenderness: There is no abdominal tenderness. There is no rebound.  Neurological:     Mental Status: She is alert.     Comments: Just says yes for everything. Does follow Simple Commands Mostly Wheel Chair Bound. Has tremors in Both hands. Also has Stiffness in Right Side   Psychiatric:        Behavior: Behavior normal.        Thought Content: Thought content normal.     Labs reviewed: Recent Labs    02/01/18 0710 05/31/18 0901 08/29/18 1020  NA 139 143 143  K 3.7 4.4 3.6  CL 104 106 109  CO2 27 26 26   GLUCOSE 96 90  81  BUN 21* 23 22  CREATININE 0.89 0.78 0.85  CALCIUM 8.8* 9.2 8.7*   Recent Labs    11/03/18 0500  AST 19  ALT 14  ALKPHOS 42  BILITOT 0.3  PROT 6.9  ALBUMIN 3.1*   Recent Labs    12/07/17 0804 02/01/18 0710 05/31/18 0901 08/29/18 1020  WBC 3.9* 4.5 5.0 6.3  NEUTROABS 1.5* 2.0  --  3.2  HGB 10.6* 11.0* 12.2 10.9*  HCT 34.0* 35.0* 38.2 35.9*  MCV 91.6 90.9 93.2 93.7  PLT 227 213 244 262   Lab Results  Component Value Date   TSH 1.076 05/31/2018   No results found for: HGBA1C Lab Results  Component Value Date   CHOL 101 11/03/2018   HDL 40 (L) 11/03/2018   LDLCALC 43 11/03/2018   TRIG 88 11/03/2018   CHOLHDL 2.5 11/03/2018    Significant Diagnostic Results in last 30 days:  No results found.  Assessment/Plan Essential hypertension BP Controlled since Clonidine dose was increased On Cozaar also  GERD On Zantac and stable  Age-related osteoporosis without current pathological fracture Continue on Fosamax and Vit D Hypothyroidism TSH Normal in 10/19 Same dose of Synthroid Repeat TSH  Dementia Some change in weight . Will continue to monitor  Arthritis On Tramadol for pain  Iron deficiency anemia Hgb Normal on iron Supplement Guaiac negative in the past No More work Up due to her Age,  Repeat CBC   Hyperlipidemia,with CVA history LDL 43 in 03/20 On Statin  Vitamin D deficiency On Supplement    Family/ staff Communication:   Labs/tests ordered:  BMP,CBC,TSH Total time spent in this patient care encounter was 25_ minutes; greater than 50% of the visit spent , reviewing records , Labs and coordinating care for problems addressed at this encounter.

## 2018-11-29 ENCOUNTER — Other Ambulatory Visit: Payer: Self-pay | Admitting: Internal Medicine

## 2018-11-29 MED ORDER — TRAMADOL HCL 50 MG PO TABS: 100.0000 mg | ORAL_TABLET | Freq: Every day | ORAL | 0 refills | Status: DC

## 2018-12-07 ENCOUNTER — Encounter (HOSPITAL_COMMUNITY)
Admission: RE | Admit: 2018-12-07 | Discharge: 2018-12-07 | Disposition: A | Discharge status: Home / Self Care | Payer: Medicare Other | Source: Skilled Nursing Facility | Attending: Internal Medicine | Admitting: Internal Medicine

## 2018-12-07 DIAGNOSIS — I10 Essential (primary) hypertension: Secondary | ICD-10-CM | POA: Insufficient documentation

## 2018-12-07 DIAGNOSIS — Z993 Dependence on wheelchair: Secondary | ICD-10-CM | POA: Insufficient documentation

## 2018-12-07 DIAGNOSIS — R293 Abnormal posture: Secondary | ICD-10-CM | POA: Diagnosis not present

## 2018-12-07 DIAGNOSIS — H1032 Unspecified acute conjunctivitis, left eye: Secondary | ICD-10-CM | POA: Insufficient documentation

## 2018-12-07 DIAGNOSIS — R05 Cough: Secondary | ICD-10-CM | POA: Insufficient documentation

## 2018-12-07 DIAGNOSIS — R1312 Dysphagia, oropharyngeal phase: Secondary | ICD-10-CM | POA: Insufficient documentation

## 2018-12-07 DIAGNOSIS — M1990 Unspecified osteoarthritis, unspecified site: Secondary | ICD-10-CM | POA: Insufficient documentation

## 2018-12-07 LAB — CBC WITH DIFFERENTIAL/PLATELET
Abs Immature Granulocytes: 0 10*3/uL (ref 0.00–0.07)
Basophils Absolute: 0 10*3/uL (ref 0.0–0.1)
Basophils Relative: 1 %
Eosinophils Absolute: 0.3 10*3/uL (ref 0.0–0.5)
Eosinophils Relative: 6 %
HCT: 34.4 % — ABNORMAL LOW (ref 36.0–46.0)
Hemoglobin: 10.5 g/dL — ABNORMAL LOW (ref 12.0–15.0)
Immature Granulocytes: 0 %
Lymphocytes Relative: 46 %
Lymphs Abs: 2.2 10*3/uL (ref 0.7–4.0)
MCH: 28.7 pg (ref 26.0–34.0)
MCHC: 30.5 g/dL (ref 30.0–36.0)
MCV: 94 fL (ref 80.0–100.0)
Monocytes Absolute: 0.5 10*3/uL (ref 0.1–1.0)
Monocytes Relative: 10 %
Neutro Abs: 1.7 10*3/uL (ref 1.7–7.7)
Neutrophils Relative %: 37 %
Platelets: 190 10*3/uL (ref 150–400)
RBC: 3.66 MIL/uL — ABNORMAL LOW (ref 3.87–5.11)
RDW: 13.3 % (ref 11.5–15.5)
WBC: 4.6 10*3/uL (ref 4.0–10.5)
nRBC: 0 % (ref 0.0–0.2)

## 2018-12-07 LAB — BASIC METABOLIC PANEL
Anion gap: 8 (ref 5–15)
BUN: 27 mg/dL — ABNORMAL HIGH (ref 8–23)
CO2: 25 mmol/L (ref 22–32)
Calcium: 8.7 mg/dL — ABNORMAL LOW (ref 8.9–10.3)
Chloride: 114 mmol/L — ABNORMAL HIGH (ref 98–111)
Creatinine, Ser: 0.85 mg/dL (ref 0.44–1.00)
GFR calc Af Amer: >60 mL/min (ref >60)
GFR calc non Af Amer: 58 mL/min — ABNORMAL LOW (ref >60)
Glucose, Bld: 99 mg/dL (ref 70–99)
Potassium: 3.5 mmol/L (ref 3.5–5.1)
Sodium: 147 mmol/L — ABNORMAL HIGH (ref 135–145)

## 2018-12-07 LAB — TSH: TSH: 0.816 u[IU]/mL (ref 0.350–4.500)

## 2018-12-08 ENCOUNTER — Other Ambulatory Visit: Payer: Self-pay | Admitting: Internal Medicine

## 2018-12-08 MED ORDER — TRAMADOL HCL 50 MG PO TABS: 100.0000 mg | ORAL_TABLET | Freq: Every day | ORAL | 0 refills | Status: DC

## 2018-12-14 ENCOUNTER — Other Ambulatory Visit (HOSPITAL_COMMUNITY)
Admission: RE | Admit: 2018-12-14 | Discharge: 2018-12-14 | Disposition: A | Discharge status: Home / Self Care | Payer: Medicare Other | Source: Skilled Nursing Facility | Attending: Internal Medicine | Admitting: Internal Medicine

## 2018-12-14 ENCOUNTER — Non-Acute Institutional Stay (SKILLED_NURSING_FACILITY): Payer: Medicare Other | Admitting: Internal Medicine

## 2018-12-14 ENCOUNTER — Encounter: Payer: Self-pay | Admitting: Internal Medicine

## 2018-12-14 DIAGNOSIS — I10 Essential (primary) hypertension: Secondary | ICD-10-CM

## 2018-12-14 DIAGNOSIS — F0281 Dementia in other diseases classified elsewhere with behavioral disturbance: Secondary | ICD-10-CM | POA: Diagnosis not present

## 2018-12-14 DIAGNOSIS — E870 Hyperosmolality and hypernatremia: Secondary | ICD-10-CM | POA: Diagnosis not present

## 2018-12-14 DIAGNOSIS — R627 Adult failure to thrive: Secondary | ICD-10-CM

## 2018-12-14 DIAGNOSIS — G301 Alzheimer's disease with late onset: Secondary | ICD-10-CM | POA: Diagnosis not present

## 2018-12-14 DIAGNOSIS — F02818 Dementia in other diseases classified elsewhere, unspecified severity, with other behavioral disturbance: Secondary | ICD-10-CM

## 2018-12-14 DIAGNOSIS — E876 Hypokalemia: Secondary | ICD-10-CM | POA: Insufficient documentation

## 2018-12-14 LAB — BASIC METABOLIC PANEL
Anion gap: 7 (ref 5–15)
BUN: 29 mg/dL — ABNORMAL HIGH (ref 8–23)
CO2: 27 mmol/L (ref 22–32)
Calcium: 8.8 mg/dL — ABNORMAL LOW (ref 8.9–10.3)
Chloride: 114 mmol/L — ABNORMAL HIGH (ref 98–111)
Creatinine, Ser: 1 mg/dL (ref 0.44–1.00)
GFR calc Af Amer: 55 mL/min — ABNORMAL LOW (ref >60)
GFR calc non Af Amer: 48 mL/min — ABNORMAL LOW (ref >60)
Glucose, Bld: 107 mg/dL — ABNORMAL HIGH (ref 70–99)
Potassium: 3.9 mmol/L (ref 3.5–5.1)
Sodium: 148 mmol/L — ABNORMAL HIGH (ref 135–145)

## 2018-12-14 NOTE — Progress Notes (Addendum)
Location:  [place] Room Number: [address] D Place of Service:  SNF (31) Provider:  Veleta Miners, MD  [name], MD  Patient Care Team: [name], MD as PCP - General (Internal Medicine)  Extended Emergency Contact Information Primary Emergency Contact: [name] Address: [address]          [place],  [place] Home Phone: [phone] Relation: None Secondary Emergency Contact: [place], [place] [address] [name] of [place] Phone: [phone] Relation: Grandson  Code Status:  DNR Goals of care: Advanced Directive information Advanced Directives 12/14/2018  Does Patient Have a Medical Advance Directive? Yes  Type of Advance Directive Out of facility DNR (pink MOST or yellow form)  Does patient want to make changes to medical advance directive? No - Patient declined  Copy of [place] in Chart? -  Would patient like information on creating a medical advance directive? -  Pre-existing out of facility DNR order (yellow form or pink MOST form) Yellow form placed in chart (order not valid for inpatient use)     Chief Complaint  Patient presents with  . Acute Visit    Hypernatremia    HPI:  Pt is a 85 y.o. female seen today for an acute visit for Hypernatremia Patient has H/O Dementia, hypothyroidism, Hypertension and anemiaand Osteoporosis.. She is long term resident of facility. Due to restrictions her daughter and other family members are unable to come to see her and per nurses her appetite is not good anymore. She has advance dementia and does not talk . We had done regular visit on her and it showed Sodium of 147 . Inspite of incraseing her Fluid uptake it is 148 today Per nurses she is not eating good only 25 % of her food She has no nausea or vomiting . No diarrhea Patient is sitting comfortably in her wheelchair . Has no Acute issues per Nurses  Past Medical History:  Diagnosis  Date  . Anemia   . Arthritis   . Bradycardia   . Cancer [facility])    had breast cancer 8 yrs ago  . 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 GERD (gastroesophageal reflux disease)   . HCAP (healthcare-associated pneumonia)   . Hypertension   . Hypokalemia   . Hypomagnesemia   . Osteoarthritis   . Pancreatitis, acute 05/03/2015  . Thyroid disease   . Urinary tract infection 02/07/16   Coag negative staph. Nitrofurantoin prescribed   . UTI (lower urinary tract infection) 04/03/2016   Proteus treated with Septra DS   Past Surgical History:  Procedure Laterality Date  . BREAST SURGERY    . ERCP N/A 01/22/2016   Procedure: ENDOSCOPIC RETROGRADE CHOLANGIOPANCREATOGRAPHY (ERCP) With biliary sphincterotomy and stone extraction ;  Surgeon: Rogene Houston, MD;  Location: [facility];  Service: Endoscopy;  Laterality: N/A;  . ESOPHAGOGASTRODUODENOSCOPY N/A 05/07/2015   Procedure: ESOPHAGOGASTRODUODENOSCOPY (EGD);  Surgeon: Rogene Houston, MD;  Location: [facility];  Service: Endoscopy;  Laterality: N/A;    Allergies  Allergen Reactions  . Codeine Nausea And Vomiting  . Nsaids     Outpatient Encounter Medications as of 12/14/2018  Medication Sig  . acetaminophen (TYLENOL) 325 MG tablet Take 650 mg by mouth every 6 (six) hours as needed.  Marland Kitchen alendronate (FOSAMAX) 70 MG tablet Take 70 mg by mouth once a week. On Sunday.  Take with a  full glass of water on an empty stomach.  Roseanne Kaufman Peru-Castor Oil (VENELEX) OINT Apply to sacrum and bilateral buttock every shift and as needed for prevention  . Cholecalciferol 1000 units TBDP Take 1 tablet by mouth daily  . cloNIDine (CATAPRES) 0.2 MG tablet Take 0.2 mg by mouth 2 (two) times daily.  . famotidine (PEPCID) 20 MG tablet Take 20 mg by mouth daily.  . feeding supplement, GLUCERNA SHAKE, (GLUCERNA SHAKE) LIQD Take 237 mLs by mouth 2 (two) times daily between meals.  . ferrous sulfate (FERROUSUL) 325 (65 FE) MG tablet Take 325 mg by  mouth daily with breakfast.  . ipratropium-albuterol (DUONEB) 0.5-2.5 (3) MG/3ML SOLN Take 3 mLs by nebulization every 6 (six) hours as needed.  Marland Kitchen levothyroxine (SYNTHROID, LEVOTHROID) 175 MCG tablet Take 175 mcg by mouth daily before breakfast.  . losartan (COZAAR) 50 MG tablet Take 50 mg by mouth daily.  . Multiple Vitamins-Minerals (MULTIVITAMIN WITH MINERALS) tablet Take 1 tablet by mouth daily.  . NON FORMULARY Diet Type:  Mechanical soft, NAS  . polyethylene glycol (MIRALAX / GLYCOLAX) packet Take 17 g by mouth every evening.  . sennosides-docusate sodium (SENOKOT-S) 8.6-50 MG tablet Take 1 tablet by mouth at bedtime.  . simvastatin (ZOCOR) 10 MG tablet Take 10 mg by mouth at bedtime.  . traMADol (ULTRAM) 50 MG tablet Take 2 tablets (100 mg total) by mouth at bedtime.  . [DISCONTINUED] ranitidine (ZANTAC) 150 MG tablet Take 150 mg by mouth daily.   No facility-administered encounter medications on file as of 12/14/2018.     Review of Systems  Unable to perform ROS: Dementia    Immunization History  Administered Date(s) Administered  . Influenza,inj,Quad PF,6+ Mos 05/04/2015  . Influenza-Unspecified 05/28/2016, 06/03/2017, 06/01/2018  . Pneumococcal Conjugate-13 06/29/2016  . Pneumococcal-Unspecified 06/30/2015, 06/10/2016  . Tdap 05/12/2017   Pertinent  Health Maintenance Due  Topic Date Due  . INFLUENZA VACCINE  03/31/2019  . DEXA SCAN  Completed  . PNA vac Low Risk Adult  Completed   Fall Risk  05/29/2018 05/09/2017  Falls in the past year? No No   Functional Status Survey:    Vitals:   12/14/18 1057  BP: 130/71  Pulse: (!) 54  Temp: (!) 97.1 F (36.2 C)  Weight: 144 lb (65.3 kg)  Height: 5\' 4"  (1.626 m)   Body mass index is 24.72 kg/m. Physical Exam Vitals signs reviewed.  Constitutional:      Appearance: Normal appearance.  HENT:     Head: Normocephalic.     Nose: Nose normal.     Mouth/Throat:     Mouth: Mucous membranes are dry.  Eyes:     Pupils:  Pupils are equal, round, and reactive to light.  Cardiovascular:     Rate and Rhythm: Normal rate and regular rhythm.     Pulses: Normal pulses.  Pulmonary:     Effort: Pulmonary effort is normal. No respiratory distress.     Breath sounds: Normal breath sounds. No wheezing or rales.  Abdominal:     General: Abdomen is flat. Bowel sounds are normal. There is no distension.     Palpations: Abdomen is soft.     Tenderness: There is no abdominal tenderness. There is no guarding.  Musculoskeletal:        General: No swelling.  Skin:    General: Skin is warm and dry.  Neurological:     Mental Status: She is alert.     Comments: She looked less responsive and  was not following any commands.   Psychiatric:        Mood and Affect: Mood normal.        Thought Content: Thought content normal.        Judgment: Judgment normal.     Labs reviewed: Recent Labs    08/29/18 1020 12/07/18 0329 12/14/18 0450  NA 143 147* 148*  K 3.6 3.5 3.9  CL 109 114* 114*  CO2 26 25 27   GLUCOSE 81 99 107*  BUN 22 27* 29*  CREATININE 0.85 0.85 1.00  CALCIUM 8.7* 8.7* 8.8*   Recent Labs    11/03/18 0500  AST 19  ALT 14  ALKPHOS 42  BILITOT 0.3  PROT 6.9  ALBUMIN 3.1*   Recent Labs    02/01/18 0710 05/31/18 0901 08/29/18 1020 12/07/18 0329  WBC 4.5 5.0 6.3 4.6  NEUTROABS 2.0  --  3.2 1.7  HGB 11.0* 12.2 10.9* 10.5*  HCT 35.0* 38.2 35.9* 34.4*  MCV 90.9 93.2 93.7 94.0  PLT 213 244 262 190   Lab Results  Component Value Date   TSH 0.816 12/07/2018   No results found for: HGBA1C Lab Results  Component Value Date   CHOL 101 11/03/2018   HDL 40 (L) 11/03/2018   LDLCALC 43 11/03/2018   TRIG 88 11/03/2018   CHOLHDL 2.5 11/03/2018    Significant Diagnostic Results in last 30 days:  No results found.  Assessment/Plan Patient with Advanced Dementia and now Failure to thrive and Hypernatremia Will start her on D5W for 1 litre Repeat BMP in am. Also Check albumin to see  Nutritional status At this time will discontinue Fosamax and her statin . Decrease her Iron to 3/week Also will decrease the Clonidine to 0.1 mg BID as I see some documented Bradycardia   Family/ staff Communication: Daughter was informed  Labs/tests ordered:  BMP

## 2018-12-15 ENCOUNTER — Encounter (HOSPITAL_COMMUNITY)
Admission: RE | Admit: 2018-12-15 | Discharge: 2018-12-15 | Disposition: A | Discharge status: Home / Self Care | Payer: Medicare Other | Source: Skilled Nursing Facility | Attending: Internal Medicine | Admitting: Internal Medicine

## 2018-12-15 ENCOUNTER — Encounter: Payer: Self-pay | Admitting: Adult Health

## 2018-12-15 ENCOUNTER — Non-Acute Institutional Stay (SKILLED_NURSING_FACILITY): Payer: Medicare Other | Admitting: Adult Health

## 2018-12-15 DIAGNOSIS — G301 Alzheimer's disease with late onset: Secondary | ICD-10-CM

## 2018-12-15 DIAGNOSIS — I10 Essential (primary) hypertension: Secondary | ICD-10-CM | POA: Diagnosis not present

## 2018-12-15 DIAGNOSIS — R627 Adult failure to thrive: Secondary | ICD-10-CM

## 2018-12-15 DIAGNOSIS — F0281 Dementia in other diseases classified elsewhere with behavioral disturbance: Secondary | ICD-10-CM

## 2018-12-15 DIAGNOSIS — H1032 Unspecified acute conjunctivitis, left eye: Secondary | ICD-10-CM | POA: Diagnosis not present

## 2018-12-15 DIAGNOSIS — R1312 Dysphagia, oropharyngeal phase: Secondary | ICD-10-CM | POA: Insufficient documentation

## 2018-12-15 DIAGNOSIS — M1990 Unspecified osteoarthritis, unspecified site: Secondary | ICD-10-CM | POA: Insufficient documentation

## 2018-12-15 DIAGNOSIS — F02818 Dementia in other diseases classified elsewhere, unspecified severity, with other behavioral disturbance: Secondary | ICD-10-CM

## 2018-12-15 LAB — COMPREHENSIVE METABOLIC PANEL
ALT: 10 U/L (ref 0–44)
AST: 16 U/L (ref 15–41)
Albumin: 2.9 g/dL — ABNORMAL LOW (ref 3.5–5.0)
Alkaline Phosphatase: 36 U/L — ABNORMAL LOW (ref 38–126)
Anion gap: 7 (ref 5–15)
BUN: 27 mg/dL — ABNORMAL HIGH (ref 8–23)
CO2: 26 mmol/L (ref 22–32)
Calcium: 8.2 mg/dL — ABNORMAL LOW (ref 8.9–10.3)
Chloride: 111 mmol/L (ref 98–111)
Creatinine, Ser: 0.8 mg/dL (ref 0.44–1.00)
GFR calc Af Amer: >60 mL/min (ref >60)
GFR calc non Af Amer: >60 mL/min (ref >60)
Glucose, Bld: 116 mg/dL — ABNORMAL HIGH (ref 70–99)
Potassium: 3.7 mmol/L (ref 3.5–5.1)
Sodium: 144 mmol/L (ref 135–145)
Total Bilirubin: 0.5 mg/dL (ref 0.3–1.2)
Total Protein: 6.3 g/dL — ABNORMAL LOW (ref 6.5–8.1)

## 2018-12-15 LAB — PREALBUMIN: Prealbumin: 13 mg/dL — ABNORMAL LOW (ref 18–38)

## 2018-12-15 NOTE — Progress Notes (Signed)
Location:   [place] Room Number: [phone] [id number] Place of Service:  SNF (31)   CODE STATUS: DNR  Allergies  Allergen Reactions  . Codeine Nausea And Vomiting  . Nsaids     Chief Complaint  Patient presents with  . Acute Visit    Follow up - Abnormal labs    HPI:  She has completed her IVF with her sodium returning to normal at 144. She is being fed by staff; and is on a mech soft diet. Prior to the pandemic her family came in daily to feed. She is used to being fed by family members at least one time daily. There are reports of uncontrolled no reports of agitation or anxiety.   Past Medical History:  Diagnosis Date  . Anemia   . Arthritis   . Bradycardia   . Cancer [facility])    had breast cancer 8 yrs ago  . 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 GERD (gastroesophageal reflux disease)   . HCAP (healthcare-associated pneumonia)   . Hypertension   . Hypokalemia   . Hypomagnesemia   . Osteoarthritis   . Pancreatitis, acute 05/03/2015  . Thyroid disease   . Urinary tract infection 02/07/16   Coag negative staph. Nitrofurantoin prescribed   . UTI (lower urinary tract infection) 04/03/2016   Proteus treated with Septra DS    Past Surgical History:  Procedure Laterality Date  . BREAST SURGERY    . ERCP N/A 01/22/2016   Procedure: ENDOSCOPIC RETROGRADE CHOLANGIOPANCREATOGRAPHY (ERCP) With biliary sphincterotomy and stone extraction ;  Surgeon: Rogene Houston, MD;  Location: [facility] ORS;  Service: Endoscopy;  Laterality: N/A;  . ESOPHAGOGASTRODUODENOSCOPY N/A 05/07/2015   Procedure: ESOPHAGOGASTRODUODENOSCOPY (EGD);  Surgeon: Rogene Houston, MD;  Location: [facility] ENDO SUITE;  Service: Endoscopy;  Laterality: N/A;    Social History   Socioeconomic History  . Marital status: Widowed    Spouse name: Not on file  . Number of children: Not on file  . Years of education: Not on file  . Highest education level: Not on file  Occupational History   . Not on file  Social Needs  . Financial resource strain: Not hard at all  . Food insecurity:    Worry: Never true    Inability: Never true  . Transportation needs:    Medical: No    Non-medical: No  Tobacco Use  . Smoking status: Never Smoker  . Smokeless tobacco: Never Used  Substance and Sexual Activity  . Alcohol use: No  . Drug use: No  . Sexual activity: Not on file  Lifestyle  . Physical activity:    Days per week: 0 days    Minutes per session: 0 min  . Stress: Only a little  Relationships  . Social connections:    Talks on phone: Once a week    Gets together: Never    Attends religious service: Never    Active member of club or organization: No    Attends meetings of clubs or organizations: Never    Relationship status: Widowed  . Intimate partner violence:    Fear of current or ex partner: No    Emotionally abused: No    Physically abused: No    Forced sexual activity: No  Other Topics Concern  . Not on file  Social History Narrative  . Not on file   Family History  Problem Relation Age of Onset  . Cancer Neg  Hx   . Diabetes Neg Hx   . Heart disease Neg Hx   . Stroke Neg Hx       VITAL SIGNS BP 130/71   Pulse (!) 54   Temp (!) 97.1 F (36.2 C)   Resp 17   Ht 5\' 4"  (1.626 m)   Wt 144 lb (65.3 kg)   BMI 24.72 kg/m   Outpatient Encounter Medications as of 12/15/2018  Medication Sig  . acetaminophen (TYLENOL) 325 MG tablet Take 650 mg by mouth every 6 (six) hours as needed.  Roseanne Kaufman Peru-Castor Oil (VENELEX) OINT Apply to sacrum and bilateral buttock every shift and as needed for prevention  . Cholecalciferol 1000 units TBDP Take 1 tablet by mouth daily  . cloNIDine (CATAPRES) 0.1 MG tablet Take 0.1 mg by mouth 2 (two) times daily.   . famotidine (PEPCID) 20 MG tablet Take 20 mg by mouth daily.  . feeding supplement, GLUCERNA SHAKE, (GLUCERNA SHAKE) LIQD Take 237 mLs by mouth 2 (two) times daily between meals.  . ferrous sulfate (FERROUSUL)  325 (65 FE) MG tablet Take 325 mg by mouth daily with breakfast.  . ipratropium-albuterol (DUONEB) 0.5-2.5 (3) MG/3ML SOLN Take 3 mLs by nebulization every 6 (six) hours as needed.  Marland Kitchen levothyroxine (SYNTHROID, LEVOTHROID) 175 MCG tablet Take 175 mcg by mouth daily before breakfast.  . losartan (COZAAR) 50 MG tablet Take 50 mg by mouth daily.  . Multiple Vitamins-Minerals (MULTIVITAMIN WITH MINERALS) tablet Take 1 tablet by mouth daily.  . NON FORMULARY Diet Type:  Mechanical soft, NAS  . polyethylene glycol (MIRALAX / GLYCOLAX) packet Take 17 g by mouth every evening.  . sennosides-docusate sodium (SENOKOT-S) 8.6-50 MG tablet Take 1 tablet by mouth at bedtime.  . traMADol (ULTRAM) 50 MG tablet Take 2 tablets (100 mg total) by mouth at bedtime.  . [DISCONTINUED] alendronate (FOSAMAX) 70 MG tablet Take 70 mg by mouth once a week. On Sunday.  Take with a full glass of water on an empty stomach.  . [DISCONTINUED] simvastatin (ZOCOR) 10 MG tablet Take 10 mg by mouth at bedtime.   No facility-administered encounter medications on file as of 12/15/2018.      SIGNIFICANT DIAGNOSTIC EXAMS  LABS REVIEWED PREVIOUS:   08-10-17: chol 115; ldl 51; trig 70; hdl 50  05-31-18: wbc 5.0; hgb 11.0; hct 35.0; mcv 90.9 plt 244; glucose 90; bun 23; creat 0.78; k+ 4.4; na++ 143; ca 9.2 tsh 1.076 08-29-18: wbc 6.3; hgb 10.9; hct 35.9; mcv 93.7 plt 262; glucose 81; bun 22; creat 0.85; k+ 3.6; na++ 143; ca 8.7   LABS REVIEWED TODAY   11-03-18: liver normal albumin 3.1 12-07-18: wbc 4.6; hgb 10.5; hct 34.4; mcv 94.0; plt 190; glucose 99; bun 27; creat 0.85; k+ 3.5; na++ 147'; ca 8.7; tsh 0.816 12-14-18: glucose 107; bun 29; creat 1.00 ;k+ 3.9; na++ 148; ca 8.8 12-15-18: glucose 116; bun 27; creat 0.80; k+ 3.7; na++ 144; ca 8.2 liver normal albumin 2.9; pre-albumin 13.0   Review of Systems  Unable to perform ROS: Dementia (unable to participate )   Physical Exam Constitutional:      General: She is not in acute  distress.    Appearance: She is well-developed. She is not diaphoretic.  Neck:     Musculoskeletal: Neck supple.     Thyroid: No thyromegaly.  Cardiovascular:     Rate and Rhythm: Normal rate and regular rhythm.     Pulses: Normal pulses.     Heart sounds: Murmur  present.     Comments: 2/6 Pulmonary:     Effort: Pulmonary effort is normal. No respiratory distress.     Breath sounds: Normal breath sounds.  Abdominal:     General: Bowel sounds are normal. There is no distension.     Palpations: Abdomen is soft.     Tenderness: There is no abdominal tenderness.  Musculoskeletal:     Right lower leg: Edema present.     Left lower leg: Edema present.     Comments: Is able to move all extremities Has bilateral hand tremor  2+ bilateral lower extremity edema    Lymphadenopathy:     Cervical: No cervical adenopathy.  Skin:    General: Skin is warm and dry.  Neurological:     Mental Status: She is alert. Mental status is at baseline.  Psychiatric:        Mood and Affect: Mood normal.      ASSESSMENT/ PLAN:  TODAY;   1.  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 Adult failure to thrive  At this time she has had a slight weight loss from 150 pounds in Dec to her current weight of 144 pounds.  Will not make further changes at this time and will monitor her status.   MD is aware of resident's narcotic use and is in agreement with current plan of care. We will attempt to wean resident as apropriate   Ok Edwards NP [facility]  Contact [phone] Monday through Friday 8am- 5pm  After hours call [phone]

## 2018-12-18 ENCOUNTER — Other Ambulatory Visit: Payer: Self-pay | Admitting: Adult Health

## 2018-12-18 MED ORDER — TRAMADOL HCL 50 MG PO TABS: 50.0000 mg | ORAL_TABLET | Freq: Every day | ORAL | 0 refills | Status: DC

## 2018-12-19 DIAGNOSIS — K219 Gastro-esophageal reflux disease without esophagitis: Secondary | ICD-10-CM | POA: Diagnosis not present

## 2018-12-19 DIAGNOSIS — R1312 Dysphagia, oropharyngeal phase: Secondary | ICD-10-CM | POA: Diagnosis not present

## 2018-12-19 DIAGNOSIS — R293 Abnormal posture: Secondary | ICD-10-CM | POA: Diagnosis not present

## 2018-12-19 DIAGNOSIS — F0390 Unspecified dementia without behavioral disturbance: Secondary | ICD-10-CM | POA: Diagnosis not present

## 2018-12-19 DIAGNOSIS — Z993 Dependence on wheelchair: Secondary | ICD-10-CM | POA: Diagnosis not present

## 2018-12-19 DIAGNOSIS — M6281 Muscle weakness (generalized): Secondary | ICD-10-CM | POA: Diagnosis not present

## 2018-12-20 DIAGNOSIS — M6281 Muscle weakness (generalized): Secondary | ICD-10-CM | POA: Diagnosis not present

## 2018-12-20 DIAGNOSIS — Z993 Dependence on wheelchair: Secondary | ICD-10-CM | POA: Diagnosis not present

## 2018-12-20 DIAGNOSIS — K219 Gastro-esophageal reflux disease without esophagitis: Secondary | ICD-10-CM | POA: Diagnosis not present

## 2018-12-20 DIAGNOSIS — R293 Abnormal posture: Secondary | ICD-10-CM | POA: Diagnosis not present

## 2018-12-20 DIAGNOSIS — R1312 Dysphagia, oropharyngeal phase: Secondary | ICD-10-CM | POA: Diagnosis not present

## 2018-12-20 DIAGNOSIS — F0390 Unspecified dementia without behavioral disturbance: Secondary | ICD-10-CM | POA: Diagnosis not present

## 2018-12-21 DIAGNOSIS — M6281 Muscle weakness (generalized): Secondary | ICD-10-CM | POA: Diagnosis not present

## 2018-12-21 DIAGNOSIS — R1312 Dysphagia, oropharyngeal phase: Secondary | ICD-10-CM | POA: Diagnosis not present

## 2018-12-21 DIAGNOSIS — F0390 Unspecified dementia without behavioral disturbance: Secondary | ICD-10-CM | POA: Diagnosis not present

## 2018-12-21 DIAGNOSIS — R293 Abnormal posture: Secondary | ICD-10-CM | POA: Diagnosis not present

## 2018-12-21 DIAGNOSIS — Z993 Dependence on wheelchair: Secondary | ICD-10-CM | POA: Diagnosis not present

## 2018-12-21 DIAGNOSIS — K219 Gastro-esophageal reflux disease without esophagitis: Secondary | ICD-10-CM | POA: Diagnosis not present

## 2018-12-22 DIAGNOSIS — K219 Gastro-esophageal reflux disease without esophagitis: Secondary | ICD-10-CM | POA: Diagnosis not present

## 2018-12-22 DIAGNOSIS — M6281 Muscle weakness (generalized): Secondary | ICD-10-CM | POA: Diagnosis not present

## 2018-12-22 DIAGNOSIS — R1312 Dysphagia, oropharyngeal phase: Secondary | ICD-10-CM | POA: Diagnosis not present

## 2018-12-22 DIAGNOSIS — R293 Abnormal posture: Secondary | ICD-10-CM | POA: Diagnosis not present

## 2018-12-22 DIAGNOSIS — F0390 Unspecified dementia without behavioral disturbance: Secondary | ICD-10-CM | POA: Diagnosis not present

## 2018-12-22 DIAGNOSIS — Z993 Dependence on wheelchair: Secondary | ICD-10-CM | POA: Diagnosis not present

## 2018-12-25 ENCOUNTER — Encounter: Payer: Self-pay | Admitting: Adult Health

## 2018-12-25 ENCOUNTER — Non-Acute Institutional Stay (SKILLED_NURSING_FACILITY): Payer: Medicare Other | Admitting: Adult Health

## 2018-12-25 DIAGNOSIS — G301 Alzheimer's disease with late onset: Secondary | ICD-10-CM

## 2018-12-25 DIAGNOSIS — F0390 Unspecified dementia without behavioral disturbance: Secondary | ICD-10-CM | POA: Diagnosis not present

## 2018-12-25 DIAGNOSIS — K219 Gastro-esophageal reflux disease without esophagitis: Secondary | ICD-10-CM | POA: Diagnosis not present

## 2018-12-25 DIAGNOSIS — M6281 Muscle weakness (generalized): Secondary | ICD-10-CM | POA: Diagnosis not present

## 2018-12-25 DIAGNOSIS — R1312 Dysphagia, oropharyngeal phase: Secondary | ICD-10-CM | POA: Diagnosis not present

## 2018-12-25 DIAGNOSIS — F02818 Dementia in other diseases classified elsewhere, unspecified severity, with other behavioral disturbance: Secondary | ICD-10-CM

## 2018-12-25 DIAGNOSIS — R627 Adult failure to thrive: Secondary | ICD-10-CM

## 2018-12-25 DIAGNOSIS — Z993 Dependence on wheelchair: Secondary | ICD-10-CM | POA: Diagnosis not present

## 2018-12-25 DIAGNOSIS — R293 Abnormal posture: Secondary | ICD-10-CM | POA: Diagnosis not present

## 2018-12-25 DIAGNOSIS — F0281 Dementia in other diseases classified elsewhere with behavioral disturbance: Secondary | ICD-10-CM

## 2018-12-25 DIAGNOSIS — K5909 Other constipation: Secondary | ICD-10-CM

## 2018-12-25 NOTE — Progress Notes (Signed)
Location:   [place] Room Number: [phone] Place of Service:  SNF (31)   CODE STATUS: DNR  Allergies  Allergen Reactions  . Codeine Nausea And Vomiting  . Nsaids     Chief Complaint  Patient presents with  . Medical Management of Chronic Issues    Late onset alzheimer's disease with behavioral disturbance; chronic constipation; adult failure to thrive.     HPI:  She is a 85 year old long term resident of this facility being seen for the management of her chronic illnesses: alzheimer's disease; failure to thrive; constipation. She has lost some weight from 149 pounds to her weight of 144 pounds. No reports of uncontrolled pain; no reports of agitation or emotional distress. No reports of fevers present.   Past Medical History:  Diagnosis Date  . Anemia   . Arthritis   . Bradycardia   . Cancer [facility])    had breast cancer 8 yrs ago  . 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 GERD (gastroesophageal reflux disease)   . HCAP (healthcare-associated pneumonia)   . Hypertension   . Hypokalemia   . Hypomagnesemia   . Osteoarthritis   . Pancreatitis, acute 05/03/2015  . Thyroid disease   . Urinary tract infection 02/07/16   Coag negative staph. Nitrofurantoin prescribed   . UTI (lower urinary tract infection) 04/03/2016   Proteus treated with Septra DS    Past Surgical History:  Procedure Laterality Date  . BREAST SURGERY    . ERCP N/A 01/22/2016   Procedure: ENDOSCOPIC RETROGRADE CHOLANGIOPANCREATOGRAPHY (ERCP) With biliary sphincterotomy and stone extraction ;  Surgeon: Rogene Houston, MD;  Location: [facility] ORS;  Service: Endoscopy;  Laterality: N/A;  . ESOPHAGOGASTRODUODENOSCOPY N/A 05/07/2015   Procedure: ESOPHAGOGASTRODUODENOSCOPY (EGD);  Surgeon: Rogene Houston, MD;  Location: [facility] ENDO SUITE;  Service: Endoscopy;  Laterality: N/A;    Social History   Socioeconomic History  . Marital status: Widowed    Spouse name: Not on file   . Number of children: Not on file  . Years of education: Not on file  . Highest education level: Not on file  Occupational History  . Not on file  Social Needs  . Financial resource strain: Not hard at all  . Food insecurity:    Worry: Never true    Inability: Never true  . Transportation needs:    Medical: No    Non-medical: No  Tobacco Use  . Smoking status: Never Smoker  . Smokeless tobacco: Never Used  Substance and Sexual Activity  . Alcohol use: No  . Drug use: No  . Sexual activity: Not on file  Lifestyle  . Physical activity:    Days per week: 0 days    Minutes per session: 0 min  . Stress: Only a little  Relationships  . Social connections:    Talks on phone: Once a week    Gets together: Never    Attends religious service: Never    Active member of club or organization: No    Attends meetings of clubs or organizations: Never    Relationship status: Widowed  . Intimate partner violence:    Fear of current or ex partner: No    Emotionally abused: No    Physically abused: No    Forced sexual activity: No  Other Topics Concern  . Not on file  Social History Narrative  . Not on file   Family History  Problem  Relation Age of Onset  . Cancer Neg Hx   . Diabetes Neg Hx   . Heart disease Neg Hx   . Stroke Neg Hx       VITAL SIGNS BP 132/62   Pulse 86   Temp 98.8 F (37.1 C)   Resp 18   Ht 5\' 4"  (1.626 m)   Wt 144 lb (65.3 kg)   BMI 24.72 kg/m   Outpatient Encounter Medications as of 12/25/2018  Medication Sig  . acetaminophen (TYLENOL) 325 MG tablet Take 650 mg by mouth every 6 (six) hours as needed.  Roseanne Kaufman Peru-Castor Oil (VENELEX) OINT Apply to sacrum and bilateral buttock every shift and as needed for prevention  . Cholecalciferol 1000 units TBDP Take 1 tablet by mouth daily  . cloNIDine (CATAPRES) 0.1 MG tablet Take 0.1 mg by mouth 2 (two) times daily.   . famotidine (PEPCID) 20 MG tablet Take 20 mg by mouth daily.  . ferrous sulfate  (FERROUSUL) 325 (65 FE) MG tablet Take 325 mg by mouth daily with breakfast.  . ipratropium-albuterol (DUONEB) 0.5-2.5 (3) MG/3ML SOLN Take 3 mLs by nebulization every 6 (six) hours as needed.  Marland Kitchen levothyroxine (SYNTHROID, LEVOTHROID) 175 MCG tablet Take 175 mcg by mouth daily before breakfast.  . losartan (COZAAR) 50 MG tablet Take 50 mg by mouth daily.  . Multiple Vitamins-Minerals (MULTIVITAMIN WITH MINERALS) tablet Take 1 tablet by mouth daily.  . NON FORMULARY Diet Type: Regular, D3 textures  . polyethylene glycol (MIRALAX / GLYCOLAX) packet Take 17 g by mouth every evening.  . sennosides-docusate sodium (SENOKOT-S) 8.6-50 MG tablet Take 1 tablet by mouth at bedtime.  . traMADol (ULTRAM) 50 MG tablet Take 1 tablet (50 mg total) by mouth at bedtime.  . [DISCONTINUED] feeding supplement, GLUCERNA SHAKE, (GLUCERNA SHAKE) LIQD Take 237 mLs by mouth 2 (two) times daily between meals.   No facility-administered encounter medications on file as of 12/25/2018.      SIGNIFICANT DIAGNOSTIC EXAMS  LABS REVIEWED PREVIOUS:   08-10-17: chol 115; ldl 51; trig 70; hdl 50  05-31-18: wbc 5.0; hgb 11.0; hct 35.0; mcv 90.9 plt 244; glucose 90; bun 23; creat 0.78; k+ 4.4; na++ 143; ca 9.2 tsh 1.076 08-29-18: wbc 6.3; hgb 10.9; hct 35.9; mcv 93.7 plt 262; glucose 81; bun 22; creat 0.85; k+ 3.6; na++ 143; ca 8.7  11-03-18: liver normal albumin 3.1 12-07-18: wbc 4.6; hgb 10.5; hct 34.4; mcv 94.0; plt 190; glucose 99; bun 27; creat 0.85; k+ 3.5; na++ 147'; ca 8.7; tsh 0.816 12-14-18: glucose 107; bun 29; creat 1.00 ;k+ 3.9; na++ 148; ca 8.8 12-15-18: glucose 116; bun 27; creat 0.80; k+ 3.7; na++ 144; ca 8.2 liver normal albumin 2.9; pre-albumin 13.0  NO NEW LABS.   Review of Systems  Unable to perform ROS: Dementia (unable to participate )   Physical Exam Constitutional:      General: She is not in acute distress.    Appearance: She is well-developed. She is not diaphoretic.     Comments: thin  Neck:      Musculoskeletal: Neck supple.     Thyroid: No thyromegaly.  Cardiovascular:     Rate and Rhythm: Normal rate and regular rhythm.     Pulses: Normal pulses.     Heart sounds: Murmur present.     Comments: 2/6 Pulmonary:     Effort: Pulmonary effort is normal. No respiratory distress.     Breath sounds: Normal breath sounds.  Abdominal:  General: Bowel sounds are normal. There is no distension.     Palpations: Abdomen is soft.     Tenderness: There is no abdominal tenderness.  Musculoskeletal:     Right lower leg: Edema present.     Left lower leg: Edema present.     Comments: Is able to move all extremities Has bilateral hand tremor  trace bilateral lower extremity edema     Lymphadenopathy:     Cervical: No cervical adenopathy.  Skin:    General: Skin is warm and dry.  Neurological:     Mental Status: She is alert. Mental status is at baseline.  Psychiatric:        Mood and Affect: Mood normal.      ASSESSMENT/ PLAN:  TODAY;   1. Late onset alzheimer's disease with behavioral disturbance: no significant change: weight is 144 ( previous 149) pounds; will monitor   2. Failure to thrive in adult: no significant changes in her status; weight is 144 pounds; will continue supplements as directed will monitor   3. Chronic constipation: is stable will continue senna s daily and miralax daily   PREVIOUS   4 Age related osteoporosis without current pathological fracture: is stable will continue fosamax 70 mg weekly is on supplements.   5. Unspecified iron deficiency anemia: is stable hgb 10.9; will continue iron daily   6. Unspecified hyperlipidemia: is stable LDL 51 is off medications will monitor   7. Chronic generalized pain: is stable will continue ultram 100 mg nightly   8. Essential hypertension: is stable b/p 132/62: will continue cozaar 50 mg daily clonidine 0.2 mg twice daily  9. Mucopurulent chronic bronchitis: is stable no recent infections: will continue  duoneb every 6 hours as needed   10. GERD without esophagitis: is stable will continue zantac 150 mg daily   11. Other hypothyroidism: is stable tsh 1.076: will continue synthroid 175 mcg daily      MD is aware of resident's narcotic use and is in agreement with current plan of care. We will attempt to wean resident as apropriate   Ok Edwards NP [facility]  Contact [phone] Monday through Friday 8am- 5pm  After hours call [phone]

## 2018-12-26 DIAGNOSIS — K219 Gastro-esophageal reflux disease without esophagitis: Secondary | ICD-10-CM | POA: Diagnosis not present

## 2018-12-26 DIAGNOSIS — R293 Abnormal posture: Secondary | ICD-10-CM | POA: Diagnosis not present

## 2018-12-26 DIAGNOSIS — M6281 Muscle weakness (generalized): Secondary | ICD-10-CM | POA: Diagnosis not present

## 2018-12-26 DIAGNOSIS — Z993 Dependence on wheelchair: Secondary | ICD-10-CM | POA: Diagnosis not present

## 2018-12-26 DIAGNOSIS — F0390 Unspecified dementia without behavioral disturbance: Secondary | ICD-10-CM | POA: Diagnosis not present

## 2018-12-26 DIAGNOSIS — R1312 Dysphagia, oropharyngeal phase: Secondary | ICD-10-CM | POA: Diagnosis not present

## 2018-12-27 ENCOUNTER — Other Ambulatory Visit: Payer: Self-pay | Admitting: Adult Health

## 2018-12-27 ENCOUNTER — Non-Acute Institutional Stay (SKILLED_NURSING_FACILITY): Payer: Medicare Other | Admitting: Adult Health

## 2018-12-27 ENCOUNTER — Encounter (HOSPITAL_COMMUNITY)
Admission: RE | Admit: 2018-12-27 | Discharge: 2018-12-27 | Disposition: A | Discharge status: Home / Self Care | Payer: Medicare Other | Source: Skilled Nursing Facility | Attending: Internal Medicine | Admitting: Internal Medicine

## 2018-12-27 ENCOUNTER — Encounter: Payer: Self-pay | Admitting: Adult Health

## 2018-12-27 DIAGNOSIS — M1990 Unspecified osteoarthritis, unspecified site: Secondary | ICD-10-CM | POA: Diagnosis not present

## 2018-12-27 DIAGNOSIS — G301 Alzheimer's disease with late onset: Secondary | ICD-10-CM

## 2018-12-27 DIAGNOSIS — H1032 Unspecified acute conjunctivitis, left eye: Secondary | ICD-10-CM | POA: Diagnosis not present

## 2018-12-27 DIAGNOSIS — R293 Abnormal posture: Secondary | ICD-10-CM | POA: Insufficient documentation

## 2018-12-27 DIAGNOSIS — R627 Adult failure to thrive: Secondary | ICD-10-CM

## 2018-12-27 DIAGNOSIS — F0280 Dementia in other diseases classified elsewhere without behavioral disturbance: Secondary | ICD-10-CM

## 2018-12-27 DIAGNOSIS — Z993 Dependence on wheelchair: Secondary | ICD-10-CM | POA: Insufficient documentation

## 2018-12-27 DIAGNOSIS — I10 Essential (primary) hypertension: Secondary | ICD-10-CM | POA: Insufficient documentation

## 2018-12-27 LAB — COMPREHENSIVE METABOLIC PANEL
ALT: 18 U/L (ref 0–44)
AST: 29 U/L (ref 15–41)
Albumin: 3.5 g/dL (ref 3.5–5.0)
Alkaline Phosphatase: 52 U/L (ref 38–126)
Anion gap: 17 — ABNORMAL HIGH (ref 5–15)
BUN: 74 mg/dL — ABNORMAL HIGH (ref 8–23)
CO2: 19 mmol/L — ABNORMAL LOW (ref 22–32)
Calcium: 9.3 mg/dL (ref 8.9–10.3)
Chloride: 129 mmol/L — ABNORMAL HIGH (ref 98–111)
Creatinine, Ser: 3.07 mg/dL — ABNORMAL HIGH (ref 0.44–1.00)
GFR calc Af Amer: 14 mL/min — ABNORMAL LOW (ref >60)
GFR calc non Af Amer: 12 mL/min — ABNORMAL LOW (ref >60)
Glucose, Bld: 125 mg/dL — ABNORMAL HIGH (ref 70–99)
Potassium: 3.4 mmol/L — ABNORMAL LOW (ref 3.5–5.1)
Sodium: 165 mmol/L (ref 135–145)
Total Bilirubin: 0.8 mg/dL (ref 0.3–1.2)
Total Protein: 7.7 g/dL (ref 6.5–8.1)

## 2018-12-27 MED ORDER — MORPHINE SULFATE (CONCENTRATE) 10 MG /0.5 ML PO SOLN: 5.0000 mg | ORAL | 0 refills | Status: AC | PRN

## 2018-12-27 NOTE — Progress Notes (Signed)
Location:   [place] Room Number: [phone] Place of Service:  SNF (31)   CODE STATUS: DNR  Allergies  Allergen Reactions  . Codeine Nausea And Vomiting  . Nsaids     Chief Complaint  Patient presents with  . Acute Visit    Care Plan Meeting / Change in Status    HPI:  She has continued to decline is not eating or drinking. Her family came in every day to feed her and would spend all day feeding her meals. She is in renal failure with a sodium of 165. Her daughter is present; we have discussed her overall status. She is terminal. Her daughter wants to focus on comfort only. She agrees to stop all medications. There are no reports of uncontrolled pain; no fevers. She is not responsive at this time.   Past Medical History:  Diagnosis Date  . Anemia   . Arthritis   . Bradycardia   . Cancer [facility])    had breast cancer 8 yrs ago  . 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 GERD (gastroesophageal reflux disease)   . HCAP (healthcare-associated pneumonia)   . Hypertension   . Hypokalemia   . Hypomagnesemia   . Osteoarthritis   . Pancreatitis, acute 05/03/2015  . Thyroid disease   . Urinary tract infection 02/07/16   Coag negative staph. Nitrofurantoin prescribed   . UTI (lower urinary tract infection) 04/03/2016   Proteus treated with Septra DS    Past Surgical History:  Procedure Laterality Date  . BREAST SURGERY    . ERCP N/A 01/22/2016   Procedure: ENDOSCOPIC RETROGRADE CHOLANGIOPANCREATOGRAPHY (ERCP) With biliary sphincterotomy and stone extraction ;  Surgeon: Rogene Houston, MD;  Location: [facility] ORS;  Service: Endoscopy;  Laterality: N/A;  . ESOPHAGOGASTRODUODENOSCOPY N/A 05/07/2015   Procedure: ESOPHAGOGASTRODUODENOSCOPY (EGD);  Surgeon: Rogene Houston, MD;  Location: [facility] ENDO SUITE;  Service: Endoscopy;  Laterality: N/A;    Social History   Socioeconomic History  . Marital status: Widowed    Spouse name: Not on file  .  Number of children: Not on file  . Years of education: Not on file  . Highest education level: Not on file  Occupational History  . Not on file  Social Needs  . Financial resource strain: Not hard at all  . Food insecurity:    Worry: Never true    Inability: Never true  . Transportation needs:    Medical: No    Non-medical: No  Tobacco Use  . Smoking status: Never Smoker  . Smokeless tobacco: Never Used  Substance and Sexual Activity  . Alcohol use: No  . Drug use: No  . Sexual activity: Not on file  Lifestyle  . Physical activity:    Days per week: 0 days    Minutes per session: 0 min  . Stress: Only a little  Relationships  . Social connections:    Talks on phone: Once a week    Gets together: Never    Attends religious service: Never    Active member of club or organization: No    Attends meetings of clubs or organizations: Never    Relationship status: Widowed  . Intimate partner violence:    Fear of current or ex partner: No    Emotionally abused: No    Physically abused: No    Forced sexual activity: No  Other Topics Concern  . Not on file  Social History  Narrative  . Not on file   Family History  Problem Relation Age of Onset  . Cancer Neg Hx   . Diabetes Neg Hx   . Heart disease Neg Hx   . Stroke Neg Hx       VITAL SIGNS BP 127/88   Pulse 86   Temp 99.1 F (37.3 C)   Resp 18   Ht 5\' 4"  (1.626 m)   Wt 127 lb 3.2 oz (57.7 kg)   BMI 21.83 kg/m   Outpatient Encounter Medications as of 12/27/2018  Medication Sig  . acetaminophen (TYLENOL) 325 MG tablet Take 650 mg by mouth every 6 (six) hours as needed.  Roseanne Kaufman Peru-Castor Oil (VENELEX) OINT Apply to sacrum and bilateral buttock every shift and as needed for prevention  . Cholecalciferol 1000 units TBDP Take 1 tablet by mouth daily  . cloNIDine (CATAPRES) 0.1 MG tablet Take 0.1 mg by mouth 2 (two) times daily.   . famotidine (PEPCID) 20 MG tablet Take 20 mg by mouth daily.  . ferrous sulfate  (FERROUSUL) 325 (65 FE) MG tablet Take 325 mg by mouth daily with breakfast.  . ipratropium-albuterol (DUONEB) 0.5-2.5 (3) MG/3ML SOLN Take 3 mLs by nebulization every 6 (six) hours as needed.  Marland Kitchen levothyroxine (SYNTHROID, LEVOTHROID) 175 MCG tablet Take 175 mcg by mouth daily before breakfast.  . losartan (COZAAR) 50 MG tablet Take 50 mg by mouth daily.  . Multiple Vitamins-Minerals (MULTIVITAMIN WITH MINERALS) tablet Take 1 tablet by mouth daily.  . NON FORMULARY Diet Type: Regular, D3 textures  . polyethylene glycol (MIRALAX / GLYCOLAX) packet Take 17 g by mouth daily as needed.   . sennosides-docusate sodium (SENOKOT-S) 8.6-50 MG tablet Take 1 tablet by mouth at bedtime.  . traMADol (ULTRAM) 50 MG tablet Take 1 tablet (50 mg total) by mouth at bedtime.   No facility-administered encounter medications on file as of 12/27/2018.      SIGNIFICANT DIAGNOSTIC EXAMS  LABS REVIEWED PREVIOUS:   08-10-17: chol 115; ldl 51; trig 70; hdl 50  05-31-18: wbc 5.0; hgb 11.0; hct 35.0; mcv 90.9 plt 244; glucose 90; bun 23; creat 0.78; k+ 4.4; na++ 143; ca 9.2 tsh 1.076 08-29-18: wbc 6.3; hgb 10.9; hct 35.9; mcv 93.7 plt 262; glucose 81; bun 22; creat 0.85; k+ 3.6; na++ 143; ca 8.7  11-03-18: liver normal albumin 3.1 12-07-18: wbc 4.6; hgb 10.5; hct 34.4; mcv 94.0; plt 190; glucose 99; bun 27; creat 0.85; k+ 3.5; na++ 147'; ca 8.7; tsh 0.816 12-14-18: glucose 107; bun 29; creat 1.00 ;k+ 3.9; na++ 148; ca 8.8 12-15-18: glucose 116; bun 27; creat 0.80; k+ 3.7; na++ 144; ca 8.2 liver normal albumin 2.9; pre-albumin 13.0  TODAY:   12-27-18: glucose 125;bun 74; creat 3.07 k+ 3.4; na++165 ca9.3 liver normal albumin 2.9   Review of Systems  Unable to perform ROS: Patient unresponsive    Physical Exam Constitutional:      General: She is not in acute distress.    Appearance: She is underweight. She is not diaphoretic.  Neck:     Musculoskeletal: Neck supple.     Thyroid: No thyromegaly.  Cardiovascular:      Rate and Rhythm: Normal rate and regular rhythm.     Pulses: Normal pulses.     Heart sounds: Murmur present.     Comments: 2/6 Pulmonary:     Effort: Pulmonary effort is normal. No respiratory distress.     Breath sounds: Normal breath sounds.  Abdominal:  General: Bowel sounds are normal. There is no distension.     Palpations: Abdomen is soft.     Tenderness: There is no abdominal tenderness.  Musculoskeletal:     Right lower leg: No edema.     Left lower leg: No edema.  Lymphadenopathy:     Cervical: No cervical adenopathy.  Skin:    General: Skin is warm and dry.  Neurological:     Comments: Unaware        ASSESSMENT/ PLAN:  TODAY ;  1. Late onset alzheimer's disease without behavioral disturbance 2. Adult failure to thrive  Will stop all medications Will begin roxanol 5 mg every 4 hours as needed Will focus her are upon comfort   MD is aware of resident's narcotic use and is in agreement with current plan of care. We will attempt to wean resident as apropriate   Ok Edwards NP [facility]  Contact [phone] Monday through Friday 8am- 5pm  After hours call [phone]

## 2018-12-27 NOTE — Progress Notes (Unsigned)
morphin

## 2018-12-28 ENCOUNTER — Non-Acute Institutional Stay (SKILLED_NURSING_FACILITY): Payer: Medicare Other | Admitting: Internal Medicine

## 2018-12-28 ENCOUNTER — Encounter: Payer: Self-pay | Admitting: Internal Medicine

## 2018-12-28 DIAGNOSIS — F0280 Dementia in other diseases classified elsewhere without behavioral disturbance: Secondary | ICD-10-CM

## 2018-12-28 DIAGNOSIS — E870 Hyperosmolality and hypernatremia: Secondary | ICD-10-CM | POA: Diagnosis not present

## 2018-12-28 DIAGNOSIS — G301 Alzheimer's disease with late onset: Secondary | ICD-10-CM | POA: Diagnosis not present

## 2018-12-28 DIAGNOSIS — I10 Essential (primary) hypertension: Secondary | ICD-10-CM

## 2018-12-28 DIAGNOSIS — R627 Adult failure to thrive: Secondary | ICD-10-CM | POA: Diagnosis not present

## 2018-12-28 NOTE — Progress Notes (Signed)
Location:  [facility] Room Number: [place] of Service:  SNF [phone]) Provider:  Veleta Miners, MD  [name], MD  Patient Care Team: [name], MD as PCP - General (Internal Medicine)  Extended Emergency Contact Information Primary Emergency Contact: [name] Address: [address]          [place],  [place] Home Phone: [phone] Relation: None Secondary Emergency Contact: [place], [place] [address] [name] of [place] Phone: [phone] Relation: Grandson  Code Status:  DNR Goals of care: Advanced Directive information Advanced Directives 12/28/2018  Does Patient Have a Medical Advance Directive? Yes  Type of Advance Directive Out of facility DNR (pink MOST or yellow form)  Does patient want to make changes to medical advance directive? No - Patient declined  Copy of [place] in Chart? -  Would patient like information on creating a medical advance directive? -  Pre-existing out of facility DNR order (yellow form or pink MOST form) Yellow form placed in chart (order not valid for inpatient use)     Chief Complaint  Patient presents with  . Acute Visit    End of Life    HPI:  Pt is a 85 y.o. female seen today for an acute visit for End of life to d/w the Family  Patient has H/O End stage Dementia, hypothyroidism, Hypertension and anemiaand Osteoporosis.. Patient was noticed to not been eating for few days She has been not eating for while since restrictions were started and her family could not come and visit her. And sh estopped eating few days ago. Her Blood work now shows NA of 165. It was d/w the family and she is comfort care  She was not responding to me . Seemed comfortable Having Cheyne stoke breathing.  Past Medical History:  Diagnosis Date  . Anemia   . Arthritis   . Bradycardia   . Cancer [facility])    had breast cancer 8 yrs ago  . GAVE (gastric antral  vascular ectasia) 05/03/15   per EGD  . GERD (gastroesophageal reflux disease)   . HCAP (healthcare-associated pneumonia)   . Hypertension   . Hypokalemia   . Hypomagnesemia   . Osteoarthritis   . Pancreatitis, acute 05/03/2015  . Thyroid disease   . Urinary tract infection 02/07/16   Coag negative staph. Nitrofurantoin prescribed   . UTI (lower urinary tract infection) 04/03/2016   Proteus treated with Septra DS   Past Surgical History:  Procedure Laterality Date  . BREAST SURGERY    . ERCP N/A 01/22/2016   Procedure: ENDOSCOPIC RETROGRADE CHOLANGIOPANCREATOGRAPHY (ERCP) With biliary sphincterotomy and stone extraction ;  Surgeon: Rogene Houston, MD;  Location: [facility] ORS;  Service: Endoscopy;  Laterality: N/A;  . ESOPHAGOGASTRODUODENOSCOPY N/A 05/07/2015   Procedure: ESOPHAGOGASTRODUODENOSCOPY (EGD);  Surgeon: Rogene Houston, MD;  Location: [facility] ENDO SUITE;  Service: Endoscopy;  Laterality: N/A;    Allergies  Allergen Reactions  . Codeine Nausea And Vomiting  . Nsaids     Outpatient Encounter Medications as of 12/28/2018  Medication Sig  . Balsam [place]-Castor Oil (VENELEX) OINT Apply to sacrum and bilateral buttock every shift and as needed for prevention  . Morphine Sulfate (MORPHINE CONCENTRATE) 10 mg / 0.5 ml concentrated solution Take 0.25 mLs (5 mg total) by mouth every 4 (four) hours as needed for severe pain.  . NON FORMULARY Diet Type: Regular, D3 textures  . [  DISCONTINUED] acetaminophen (TYLENOL) 325 MG tablet Take 650 mg by mouth every 6 (six) hours as needed.  . [DISCONTINUED] Cholecalciferol 1000 units TBDP Take 1 tablet by mouth daily  . [DISCONTINUED] cloNIDine (CATAPRES) 0.1 MG tablet Take 0.1 mg by mouth 2 (two) times daily.   . [DISCONTINUED] famotidine (PEPCID) 20 MG tablet Take 20 mg by mouth daily.  . [DISCONTINUED] ferrous sulfate (FERROUSUL) 325 (65 FE) MG tablet Take 325 mg by mouth daily with breakfast.  . [DISCONTINUED] ipratropium-albuterol (DUONEB) 0.5-2.5 (3)  MG/3ML SOLN Take 3 mLs by nebulization every 6 (six) hours as needed.  . [DISCONTINUED] levothyroxine (SYNTHROID, LEVOTHROID) 175 MCG tablet Take 175 mcg by mouth daily before breakfast.  . [DISCONTINUED] losartan (COZAAR) 50 MG tablet Take 50 mg by mouth daily.  . [DISCONTINUED] Multiple Vitamins-Minerals (MULTIVITAMIN WITH MINERALS) tablet Take 1 tablet by mouth daily.  . [DISCONTINUED] polyethylene glycol (MIRALAX / GLYCOLAX) packet Take 17 g by mouth daily as needed.   . [DISCONTINUED] sennosides-docusate sodium (SENOKOT-S) 8.6-50 MG tablet Take 1 tablet by mouth at bedtime.  . [DISCONTINUED] traMADol (ULTRAM) 50 MG tablet Take 1 tablet (50 mg total) by mouth at bedtime. (Patient not taking: Reported on 12/28/2018)   No facility-administered encounter medications on file as of 12/28/2018.     Review of Systems  Unable to perform ROS: Patient unresponsive    Immunization History  Administered Date(s) Administered  . Influenza,inj,Quad PF,6+ Mos 05/04/2015  . Influenza-Unspecified 05/28/2016, 06/03/2017, 06/01/2018  . Pneumococcal Conjugate-13 06/29/2016  . Pneumococcal-Unspecified 06/30/2015, 06/10/2016  . Tdap 05/12/2017   Pertinent  Health Maintenance Due  Topic Date Due  . INFLUENZA VACCINE  03/31/2019  . DEXA SCAN  Completed  . PNA vac Low Risk Adult  Completed   Fall Risk  05/29/2018 05/09/2017  Falls in the past year? No No   Functional Status Survey:    Vitals:   12/28/18 1209  BP: (!) 105/59  Pulse: 81  Resp: 18  Temp: 98.3 F (36.8 C)  Weight: 127 lb 3.2 oz (57.7 kg)  Height: 5\' 4"  (1.626 m)   Body mass index is 21.83 kg/m. Physical Exam Vitals signs reviewed.  HENT:     Head: Normocephalic.     Nose: Nose normal.     Mouth/Throat:     Comments: Positive for thrush Eyes:     Pupils: Pupils are equal, round, and reactive to light.  Neck:     Musculoskeletal: Neck supple.  Cardiovascular:     Rate and Rhythm: Normal rate and regular rhythm.      Pulses: Normal pulses.     Heart sounds: Normal heart sounds.  Pulmonary:     Effort: Pulmonary effort is normal.     Comments: Has rales bilateral Abdominal:     General: Abdomen is flat. Bowel sounds are normal.     Palpations: Abdomen is soft.  Musculoskeletal:        General: No swelling.  Skin:    General: Skin is warm and dry.  Neurological:     General: No focal deficit present.  Psychiatric:        Mood and Affect: Mood normal.        Thought Content: Thought content normal.     Labs reviewed: Recent Labs    12/14/18 0450 12/15/18 0320 12/27/18 0716  NA 148* 144 165*  K 3.9 3.7 3.4*  CL 114* 111 129*  CO2 27 26 19*  GLUCOSE 107* 116* 125*  BUN 29* 27* 74*  CREATININE  1.00 0.80 3.07*  CALCIUM 8.8* 8.2* 9.3   Recent Labs    11/03/18 0500 12/15/18 0320 12/27/18 0716  AST 19 16 29   ALT 14 10 18   ALKPHOS 42 36* 52  BILITOT 0.3 0.5 0.8  PROT 6.9 6.3* 7.7  ALBUMIN 3.1* 2.9* 3.5   Recent Labs    02/01/18 0710 05/31/18 0901 08/29/18 1020 12/07/18 0329  WBC 4.5 5.0 6.3 4.6  NEUTROABS 2.0  --  3.2 1.7  HGB 11.0* 12.2 10.9* 10.5*  HCT 35.0* 38.2 35.9* 34.4*  MCV 90.9 93.2 93.7 94.0  PLT 213 244 262 190   Lab Results  Component Value Date   TSH 0.816 12/07/2018   No results found for: HGBA1C Lab Results  Component Value Date   CHOL 101 11/03/2018   HDL 40 (L) 11/03/2018   LDLCALC 43 11/03/2018   TRIG 88 11/03/2018   CHOLHDL 2.5 11/03/2018    Significant Diagnostic Results in last 30 days:  No results found.  Assessment/Plan End Stage Dementia with Failure to thrive and Hypernatremia Patient is [place] and Daughter in room Will start her on Thrush for oral care Also on Roxanol PRN   Family/ staff Communication:   Labs/tests ordered:   Total time spent in this patient care encounter was  _  minutes; greater than 50% of the visit spent [facility] and staff, reviewing records , Labs and coordinating care for problems  addressed at this encounter.

## 2019-01-29 DEATH — deceased

## 2019-02-27 IMAGING — DX DG CHEST 2V
2 series · 2 of 2 positions shown · non-contrast
Comparison: CT chest 05/17/2014 and portable chest x-ray of the
same day

CLINICAL DATA: History given questions paratracheal mass

EXAM:
CHEST  2 VIEW

[chest lat]
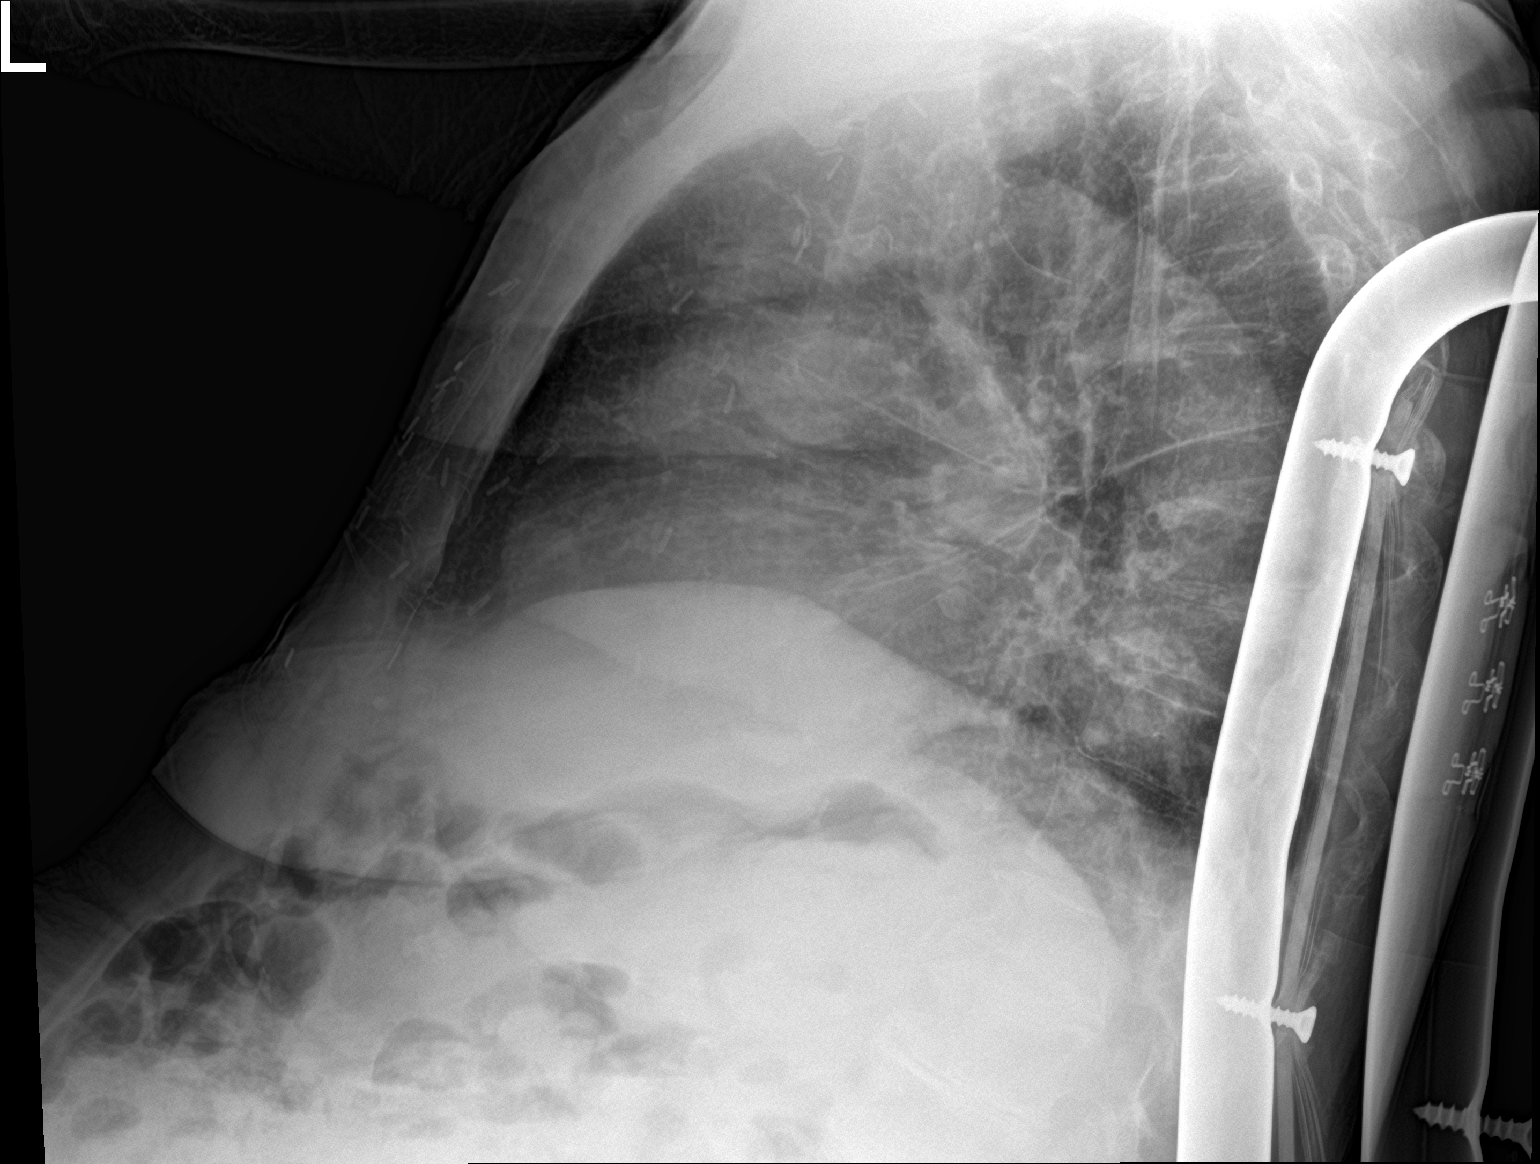

[chest ap]
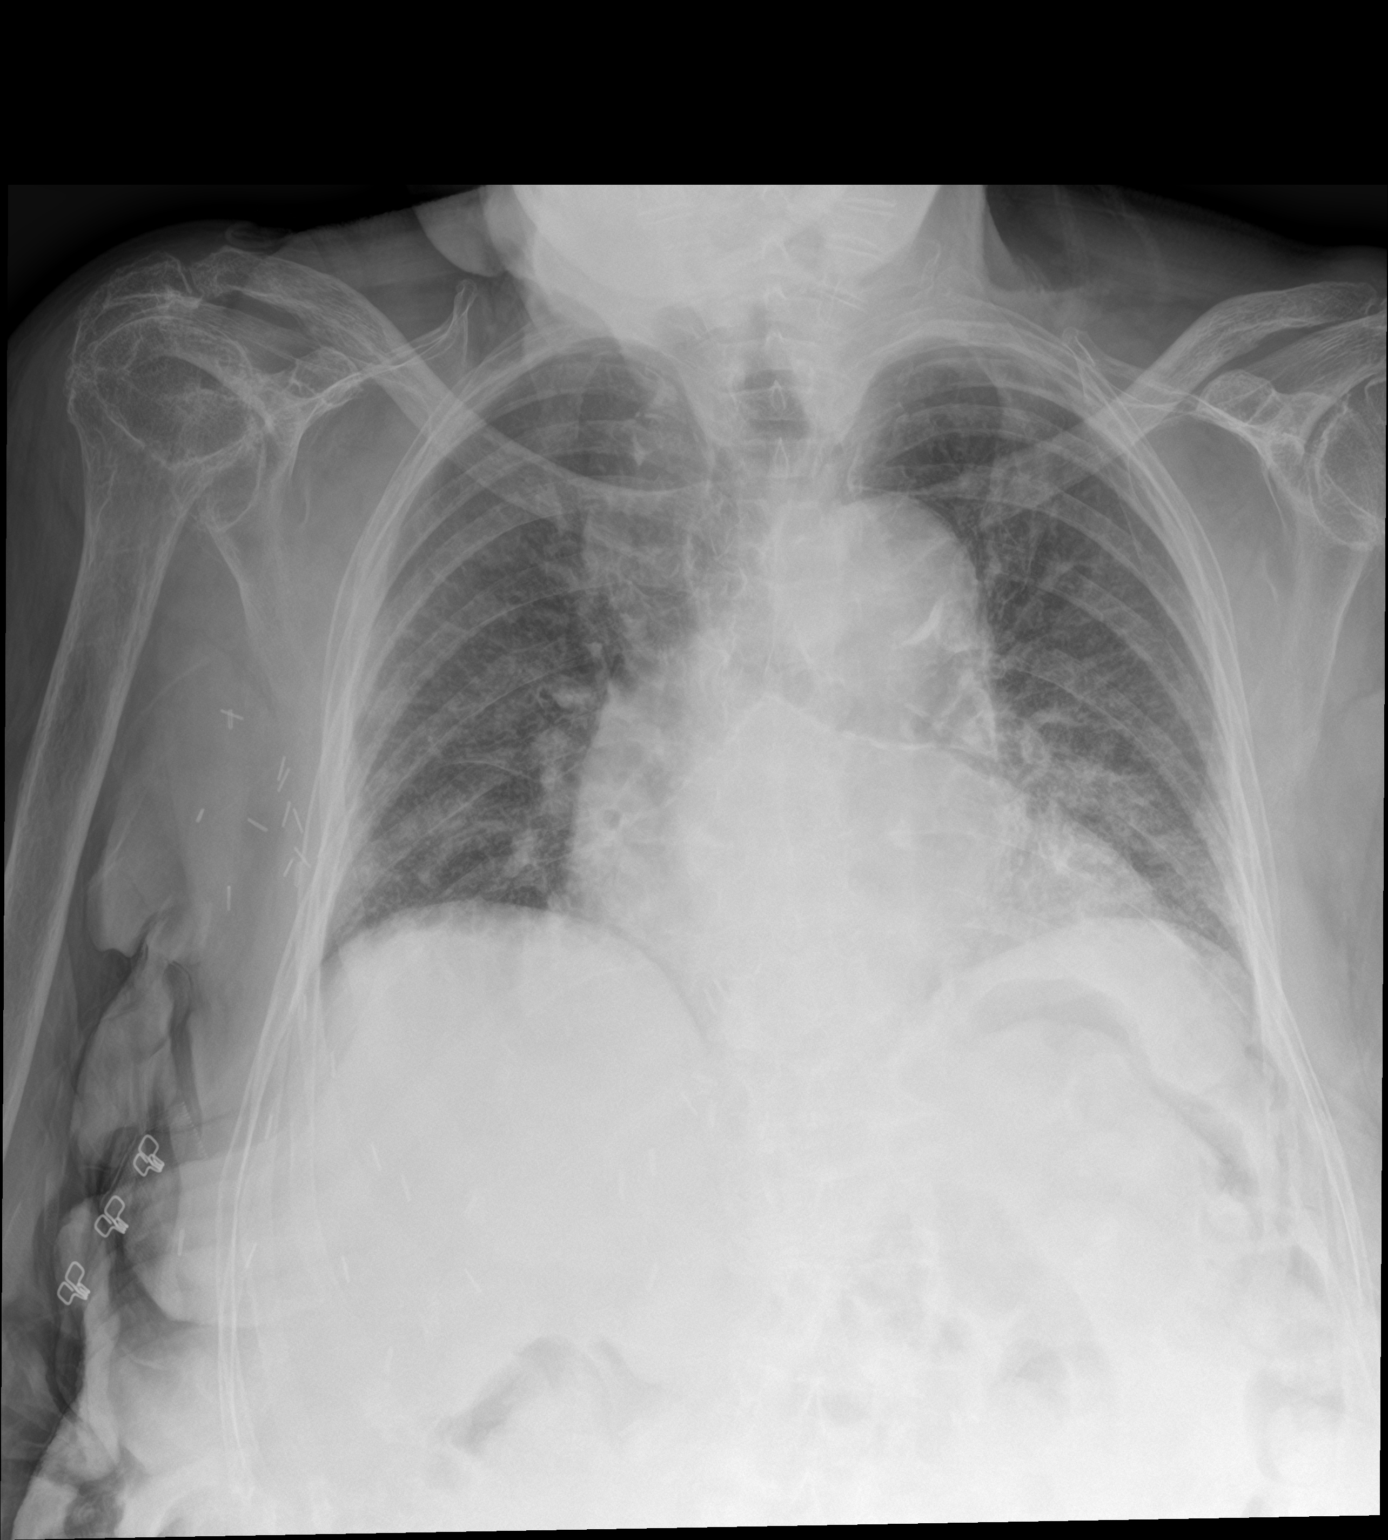

[2 of 2 positions shown; findings below may reference images not displayed]

FINDINGS: The lungs are not well aerated. No pneumonia or effusion is seen
although there is bibasilar atelectasis present. Moderate
cardiomegaly is stable with ectasia of the descending thoracic
aorta. No peritracheal mass is seen by chest x-ray for on recent CT
of the chest. The bones are osteopenic.
IMPRESSION: Stable cardiomegaly. Mild bibasilar atelectasis. No definite active
process or paratracheal mass is seen.
